# Patient Record
Sex: Female | Born: 1955 | Race: Black or African American | Hispanic: No | Marital: Single | Smoking: Former smoker
Health system: Southern US, Community
[De-identification: ages and names within clinical notes are randomized; demographics above are authoritative.]

## PROBLEM LIST (undated history)

## (undated) DIAGNOSIS — I5022 Chronic systolic (congestive) heart failure: Secondary | ICD-10-CM

## (undated) DIAGNOSIS — J449 Chronic obstructive pulmonary disease, unspecified: Secondary | ICD-10-CM

## (undated) DIAGNOSIS — I2510 Atherosclerotic heart disease of native coronary artery without angina pectoris: Secondary | ICD-10-CM

## (undated) DIAGNOSIS — Z720 Tobacco use: Secondary | ICD-10-CM

## (undated) DIAGNOSIS — E785 Hyperlipidemia, unspecified: Secondary | ICD-10-CM

## (undated) DIAGNOSIS — R739 Hyperglycemia, unspecified: Secondary | ICD-10-CM

## (undated) DIAGNOSIS — Z95810 Presence of automatic (implantable) cardiac defibrillator: Secondary | ICD-10-CM

## (undated) DIAGNOSIS — I739 Peripheral vascular disease, unspecified: Secondary | ICD-10-CM

## (undated) DIAGNOSIS — I472 Ventricular tachycardia: Secondary | ICD-10-CM

## (undated) DIAGNOSIS — I5023 Acute on chronic systolic (congestive) heart failure: Secondary | ICD-10-CM

## (undated) DIAGNOSIS — F1010 Alcohol abuse, uncomplicated: Secondary | ICD-10-CM

## (undated) DIAGNOSIS — J9600 Acute respiratory failure, unspecified whether with hypoxia or hypercapnia: Secondary | ICD-10-CM

## (undated) DIAGNOSIS — I10 Essential (primary) hypertension: Secondary | ICD-10-CM

## (undated) DIAGNOSIS — G459 Transient cerebral ischemic attack, unspecified: Secondary | ICD-10-CM

## (undated) HISTORY — DX: Essential (primary) hypertension: I10

## (undated) HISTORY — PX: CYSTOSCOPY W/ STONE MANIPULATION: SHX1427

## (undated) HISTORY — DX: Ventricular tachycardia: I47.2

## (undated) HISTORY — PX: COLONOSCOPY: SHX174

## (undated) HISTORY — DX: Hyperlipidemia, unspecified: E78.5

## (undated) HISTORY — DX: Chronic systolic (congestive) heart failure: I50.22

---

## 1997-12-10 ENCOUNTER — Emergency Department (HOSPITAL_COMMUNITY): Admission: EM | Admit: 1997-12-10 | Discharge: 1997-12-10 | Payer: Self-pay | Admitting: Emergency Medicine

## 1997-12-21 ENCOUNTER — Ambulatory Visit (HOSPITAL_COMMUNITY): Admission: RE | Admit: 1997-12-21 | Discharge: 1997-12-21 | Payer: Self-pay | Admitting: Emergency Medicine

## 1998-08-06 ENCOUNTER — Emergency Department (HOSPITAL_COMMUNITY): Admission: EM | Admit: 1998-08-06 | Discharge: 1998-08-06 | Payer: Self-pay | Admitting: Emergency Medicine

## 1999-12-20 ENCOUNTER — Emergency Department (HOSPITAL_COMMUNITY): Admission: EM | Admit: 1999-12-20 | Discharge: 1999-12-20 | Payer: Self-pay | Admitting: Emergency Medicine

## 2000-01-19 ENCOUNTER — Emergency Department (HOSPITAL_COMMUNITY): Admission: EM | Admit: 2000-01-19 | Discharge: 2000-01-19 | Payer: Self-pay | Admitting: Emergency Medicine

## 2001-03-17 ENCOUNTER — Emergency Department (HOSPITAL_COMMUNITY): Admission: EM | Admit: 2001-03-17 | Discharge: 2001-03-17 | Payer: Self-pay

## 2001-12-11 ENCOUNTER — Encounter: Payer: Self-pay | Admitting: Emergency Medicine

## 2001-12-11 ENCOUNTER — Emergency Department (HOSPITAL_COMMUNITY): Admission: EM | Admit: 2001-12-11 | Discharge: 2001-12-11 | Payer: Self-pay | Admitting: Emergency Medicine

## 2002-05-30 ENCOUNTER — Emergency Department (HOSPITAL_COMMUNITY): Admission: EM | Admit: 2002-05-30 | Discharge: 2002-05-30 | Payer: Self-pay | Admitting: Emergency Medicine

## 2002-05-30 ENCOUNTER — Encounter: Payer: Self-pay | Admitting: Emergency Medicine

## 2003-09-20 ENCOUNTER — Emergency Department (HOSPITAL_COMMUNITY): Admission: EM | Admit: 2003-09-20 | Discharge: 2003-09-20 | Payer: Self-pay | Admitting: Emergency Medicine

## 2004-09-09 ENCOUNTER — Emergency Department (HOSPITAL_COMMUNITY): Admission: EM | Admit: 2004-09-09 | Discharge: 2004-09-09 | Payer: Self-pay | Admitting: Emergency Medicine

## 2004-10-18 ENCOUNTER — Emergency Department (HOSPITAL_COMMUNITY): Admission: EM | Admit: 2004-10-18 | Discharge: 2004-10-18 | Payer: Self-pay | Admitting: Family Medicine

## 2006-07-21 ENCOUNTER — Emergency Department (HOSPITAL_COMMUNITY): Admission: EM | Admit: 2006-07-21 | Discharge: 2006-07-21 | Payer: Self-pay | Admitting: Emergency Medicine

## 2007-02-28 ENCOUNTER — Ambulatory Visit: Payer: Self-pay | Admitting: *Deleted

## 2007-02-28 ENCOUNTER — Inpatient Hospital Stay (HOSPITAL_COMMUNITY): Admission: EM | Admit: 2007-02-28 | Discharge: 2007-03-04 | Payer: Self-pay | Admitting: *Deleted

## 2007-03-05 ENCOUNTER — Ambulatory Visit: Payer: Self-pay | Admitting: *Deleted

## 2007-03-12 ENCOUNTER — Emergency Department (HOSPITAL_COMMUNITY): Admission: EM | Admit: 2007-03-12 | Discharge: 2007-03-12 | Payer: Self-pay | Admitting: Emergency Medicine

## 2007-03-20 ENCOUNTER — Ambulatory Visit: Payer: Self-pay | Admitting: Cardiology

## 2007-06-24 ENCOUNTER — Ambulatory Visit: Payer: Self-pay | Admitting: Internal Medicine

## 2007-06-24 LAB — CONVERTED CEMR LAB
Alkaline Phosphatase: 69 units/L (ref 39–117)
BUN: 10 mg/dL (ref 6–23)
Basophils Relative: 1 % (ref 0–1)
Calcium: 9.6 mg/dL (ref 8.4–10.5)
Chloride: 106 meq/L (ref 96–112)
Creatinine, Ser: 0.6 mg/dL (ref 0.40–1.20)
Eosinophils Absolute: 0.5 10*3/uL (ref 0.0–0.7)
Eosinophils Relative: 6 % — ABNORMAL HIGH (ref 0–5)
Glucose, Bld: 84 mg/dL (ref 70–99)
LDL Cholesterol: 89 mg/dL (ref 0–99)
Lymphocytes Relative: 61 % — ABNORMAL HIGH (ref 12–46)
MCHC: 32.4 g/dL (ref 30.0–36.0)
Monocytes Relative: 8 % (ref 3–11)
RDW: 14.6 % — ABNORMAL HIGH (ref 11.5–14.0)
Sodium: 142 meq/L (ref 135–145)
Total CHOL/HDL Ratio: 4.1

## 2007-07-08 ENCOUNTER — Emergency Department (HOSPITAL_COMMUNITY): Admission: EM | Admit: 2007-07-08 | Discharge: 2007-07-08 | Payer: Self-pay | Admitting: Emergency Medicine

## 2007-08-19 ENCOUNTER — Emergency Department (HOSPITAL_COMMUNITY): Admission: EM | Admit: 2007-08-19 | Discharge: 2007-08-19 | Payer: Self-pay | Admitting: Family Medicine

## 2007-08-24 ENCOUNTER — Ambulatory Visit: Payer: Self-pay | Admitting: Internal Medicine

## 2007-09-09 ENCOUNTER — Ambulatory Visit: Payer: Self-pay | Admitting: Cardiology

## 2007-09-14 ENCOUNTER — Ambulatory Visit (HOSPITAL_COMMUNITY): Admission: RE | Admit: 2007-09-14 | Discharge: 2007-09-14 | Payer: Self-pay | Admitting: Family Medicine

## 2007-09-22 ENCOUNTER — Ambulatory Visit: Payer: Self-pay

## 2007-09-22 LAB — CONVERTED CEMR LAB
ALT: 13 units/L (ref 0–35)
Albumin: 3.7 g/dL (ref 3.5–5.2)
Alkaline Phosphatase: 61 units/L (ref 39–117)
BUN: 10 mg/dL (ref 6–23)
CO2: 28 meq/L (ref 19–32)
Chloride: 106 meq/L (ref 96–112)
Cholesterol: 118 mg/dL (ref 0–200)
Creatinine, Ser: 0.6 mg/dL (ref 0.4–1.2)
Eosinophils Absolute: 0.4 10*3/uL (ref 0.0–0.6)
Eosinophils Relative: 5.4 % — ABNORMAL HIGH (ref 0.0–5.0)
GFR calc non Af Amer: 113 mL/min
Hemoglobin: 14.8 g/dL (ref 12.0–15.0)
LDL Cholesterol: 67 mg/dL (ref 0–99)
MCV: 90.1 fL (ref 78.0–100.0)
Monocytes Relative: 6.2 % (ref 3.0–11.0)
Neutro Abs: 2.7 10*3/uL (ref 1.4–7.7)
Neutrophils Relative %: 32.6 % — ABNORMAL LOW (ref 43.0–77.0)
Platelets: 168 10*3/uL (ref 150–400)
RDW: 12.5 % (ref 11.5–14.6)
Total Bilirubin: 0.8 mg/dL (ref 0.3–1.2)
Total CHOL/HDL Ratio: 3.2

## 2007-09-30 ENCOUNTER — Ambulatory Visit (HOSPITAL_COMMUNITY): Admission: RE | Admit: 2007-09-30 | Discharge: 2007-09-30 | Payer: Self-pay | Admitting: Cardiology

## 2007-09-30 ENCOUNTER — Ambulatory Visit: Payer: Self-pay | Admitting: Cardiology

## 2007-10-11 ENCOUNTER — Emergency Department (HOSPITAL_COMMUNITY): Admission: EM | Admit: 2007-10-11 | Discharge: 2007-10-11 | Payer: Self-pay | Admitting: Emergency Medicine

## 2007-10-11 ENCOUNTER — Ambulatory Visit: Payer: Self-pay | Admitting: Cardiology

## 2007-10-15 ENCOUNTER — Ambulatory Visit: Payer: Self-pay

## 2007-10-15 ENCOUNTER — Encounter: Payer: Self-pay | Admitting: Cardiology

## 2007-10-30 ENCOUNTER — Emergency Department (HOSPITAL_COMMUNITY): Admission: EM | Admit: 2007-10-30 | Discharge: 2007-10-30 | Payer: Self-pay | Admitting: Emergency Medicine

## 2007-11-17 ENCOUNTER — Ambulatory Visit: Payer: Self-pay | Admitting: Cardiology

## 2007-11-30 ENCOUNTER — Ambulatory Visit (HOSPITAL_COMMUNITY): Admission: RE | Admit: 2007-11-30 | Discharge: 2007-11-30 | Payer: Self-pay | Admitting: Surgery

## 2007-11-30 ENCOUNTER — Encounter (INDEPENDENT_AMBULATORY_CARE_PROVIDER_SITE_OTHER): Payer: Self-pay | Admitting: Surgery

## 2007-12-30 ENCOUNTER — Ambulatory Visit: Payer: Self-pay | Admitting: Internal Medicine

## 2008-01-09 ENCOUNTER — Emergency Department (HOSPITAL_COMMUNITY): Admission: EM | Admit: 2008-01-09 | Discharge: 2008-01-09 | Payer: Self-pay | Admitting: Emergency Medicine

## 2008-02-18 ENCOUNTER — Emergency Department (HOSPITAL_COMMUNITY): Admission: EM | Admit: 2008-02-18 | Discharge: 2008-02-18 | Payer: Self-pay | Admitting: Emergency Medicine

## 2008-04-25 ENCOUNTER — Emergency Department (HOSPITAL_COMMUNITY): Admission: EM | Admit: 2008-04-25 | Discharge: 2008-04-25 | Payer: Self-pay | Admitting: Emergency Medicine

## 2008-04-30 ENCOUNTER — Emergency Department (HOSPITAL_COMMUNITY): Admission: EM | Admit: 2008-04-30 | Discharge: 2008-05-01 | Payer: Self-pay | Admitting: Emergency Medicine

## 2008-05-04 ENCOUNTER — Ambulatory Visit: Payer: Self-pay | Admitting: Cardiology

## 2008-05-04 LAB — CONVERTED CEMR LAB
Calcium: 9.3 mg/dL (ref 8.4–10.5)
Creatinine, Ser: 0.8 mg/dL (ref 0.4–1.2)
GFR calc Af Amer: 98 mL/min
Glucose, Bld: 84 mg/dL (ref 70–99)

## 2008-05-09 ENCOUNTER — Ambulatory Visit: Payer: Self-pay

## 2008-05-09 ENCOUNTER — Encounter: Payer: Self-pay | Admitting: Cardiology

## 2008-05-13 ENCOUNTER — Ambulatory Visit: Payer: Self-pay | Admitting: Cardiology

## 2008-05-18 ENCOUNTER — Emergency Department (HOSPITAL_COMMUNITY): Admission: EM | Admit: 2008-05-18 | Discharge: 2008-05-18 | Payer: Self-pay | Admitting: Emergency Medicine

## 2008-06-22 ENCOUNTER — Ambulatory Visit: Payer: Self-pay | Admitting: Cardiology

## 2008-06-25 ENCOUNTER — Emergency Department (HOSPITAL_COMMUNITY): Admission: EM | Admit: 2008-06-25 | Discharge: 2008-06-25 | Payer: Self-pay | Admitting: Emergency Medicine

## 2008-06-28 ENCOUNTER — Ambulatory Visit: Payer: Self-pay | Admitting: Cardiology

## 2008-06-28 LAB — CONVERTED CEMR LAB
Alkaline Phosphatase: 64 units/L (ref 39–117)
Basophils Absolute: 0.1 10*3/uL (ref 0.0–0.1)
Basophils Relative: 0.9 % (ref 0.0–3.0)
Bilirubin, Direct: 0.1 mg/dL (ref 0.0–0.3)
Calcium: 9.1 mg/dL (ref 8.4–10.5)
Chloride: 108 meq/L (ref 96–112)
Cholesterol: 113 mg/dL (ref 0–200)
Creatinine, Ser: 0.6 mg/dL (ref 0.4–1.2)
Glucose, Bld: 114 mg/dL — ABNORMAL HIGH (ref 70–99)
HDL: 24.8 mg/dL — ABNORMAL LOW (ref >39.0)
LDL Cholesterol: 73 mg/dL (ref 0–99)
MCHC: 34.1 g/dL (ref 30.0–36.0)
MCV: 90.5 fL (ref 78.0–100.0)
Monocytes Relative: 7.7 % (ref 3.0–12.0)
Neutrophils Relative %: 30.9 % — ABNORMAL LOW (ref 43.0–77.0)
Platelets: 203 10*3/uL (ref 150–400)
RBC: 4.9 M/uL (ref 3.87–5.11)
Sodium: 141 meq/L (ref 135–145)
Triglycerides: 74 mg/dL (ref 0–149)
VLDL: 15 mg/dL (ref 0–40)

## 2008-08-22 ENCOUNTER — Emergency Department (HOSPITAL_COMMUNITY): Admission: EM | Admit: 2008-08-22 | Discharge: 2008-08-22 | Payer: Self-pay | Admitting: Emergency Medicine

## 2008-08-24 ENCOUNTER — Emergency Department (HOSPITAL_COMMUNITY): Admission: EM | Admit: 2008-08-24 | Discharge: 2008-08-24 | Payer: Self-pay | Admitting: Emergency Medicine

## 2008-09-25 ENCOUNTER — Ambulatory Visit: Payer: Self-pay | Admitting: Internal Medicine

## 2008-09-26 ENCOUNTER — Encounter: Payer: Self-pay | Admitting: Cardiology

## 2008-09-26 ENCOUNTER — Inpatient Hospital Stay (HOSPITAL_COMMUNITY): Admission: EM | Admit: 2008-09-26 | Discharge: 2008-09-30 | Payer: Self-pay | Admitting: Emergency Medicine

## 2008-09-28 ENCOUNTER — Ambulatory Visit: Payer: Self-pay | Admitting: Internal Medicine

## 2008-09-29 ENCOUNTER — Encounter: Payer: Self-pay | Admitting: Internal Medicine

## 2008-10-11 ENCOUNTER — Ambulatory Visit: Payer: Self-pay | Admitting: Internal Medicine

## 2008-10-12 LAB — CONVERTED CEMR LAB
ALT: 41 units/L — ABNORMAL HIGH (ref 0–35)
AST: 29 units/L (ref 0–37)
Albumin: 3.7 g/dL (ref 3.5–5.2)
Alkaline Phosphatase: 71 units/L (ref 39–117)
BUN: 14 mg/dL (ref 6–23)
Calcium: 9.8 mg/dL (ref 8.4–10.5)
Chloride: 107 meq/L (ref 96–112)
GFR calc non Af Amer: 70 mL/min
Sodium: 141 meq/L (ref 135–145)
Total Bilirubin: 0.4 mg/dL (ref 0.3–1.2)
Total Protein: 7.3 g/dL (ref 6.0–8.3)

## 2008-10-13 ENCOUNTER — Ambulatory Visit: Payer: Self-pay | Admitting: Cardiology

## 2008-10-17 ENCOUNTER — Ambulatory Visit: Payer: Self-pay | Admitting: Internal Medicine

## 2008-10-21 ENCOUNTER — Ambulatory Visit: Payer: Self-pay | Admitting: Cardiology

## 2008-10-21 LAB — CONVERTED CEMR LAB
BUN: 7 mg/dL (ref 6–23)
Calcium: 9.2 mg/dL (ref 8.4–10.5)
Glucose, Bld: 106 mg/dL — ABNORMAL HIGH (ref 70–99)
Potassium: 3.8 meq/L (ref 3.5–5.1)
Sodium: 140 meq/L (ref 135–145)

## 2008-10-26 ENCOUNTER — Ambulatory Visit: Payer: Self-pay | Admitting: Internal Medicine

## 2008-10-27 LAB — CONVERTED CEMR LAB
ALT: 13 units/L (ref 0–35)
Bilirubin, Direct: 0.1 mg/dL (ref 0.0–0.3)
Total Bilirubin: 0.8 mg/dL (ref 0.3–1.2)

## 2008-11-01 ENCOUNTER — Telehealth: Payer: Self-pay | Admitting: Internal Medicine

## 2008-11-15 ENCOUNTER — Encounter: Payer: Self-pay | Admitting: Internal Medicine

## 2008-11-15 ENCOUNTER — Ambulatory Visit: Payer: Self-pay | Admitting: Internal Medicine

## 2008-11-29 ENCOUNTER — Encounter: Payer: Self-pay | Admitting: Internal Medicine

## 2009-01-21 ENCOUNTER — Emergency Department (HOSPITAL_COMMUNITY): Admission: EM | Admit: 2009-01-21 | Discharge: 2009-01-21 | Payer: Self-pay | Admitting: *Deleted

## 2009-02-07 DIAGNOSIS — E785 Hyperlipidemia, unspecified: Secondary | ICD-10-CM

## 2009-02-07 DIAGNOSIS — I5022 Chronic systolic (congestive) heart failure: Secondary | ICD-10-CM

## 2009-02-07 DIAGNOSIS — I428 Other cardiomyopathies: Secondary | ICD-10-CM | POA: Insufficient documentation

## 2009-02-07 DIAGNOSIS — I2510 Atherosclerotic heart disease of native coronary artery without angina pectoris: Secondary | ICD-10-CM | POA: Insufficient documentation

## 2009-02-07 HISTORY — DX: Hyperlipidemia, unspecified: E78.5

## 2009-02-07 HISTORY — DX: Chronic systolic (congestive) heart failure: I50.22

## 2009-02-08 ENCOUNTER — Ambulatory Visit: Payer: Self-pay | Admitting: Cardiology

## 2009-02-10 LAB — CONVERTED CEMR LAB
BUN: 7 mg/dL (ref 6–23)
Basophils Absolute: 0.8 10*3/uL — ABNORMAL HIGH (ref 0.0–0.1)
CO2: 29 meq/L (ref 19–32)
Eosinophils Absolute: 0.4 10*3/uL (ref 0.0–0.7)
Glucose, Bld: 90 mg/dL (ref 70–99)
HCT: 42.7 % (ref 36.0–46.0)
Hemoglobin: 14.3 g/dL (ref 12.0–15.0)
Lymphs Abs: 3.6 10*3/uL (ref 0.7–4.0)
MCHC: 33.4 g/dL (ref 30.0–36.0)
MCV: 90.7 fL (ref 78.0–100.0)
Monocytes Absolute: 0.6 10*3/uL (ref 0.1–1.0)
Monocytes Relative: 8.9 % (ref 3.0–12.0)
Neutro Abs: 2 10*3/uL (ref 1.4–7.7)
Platelets: 162 10*3/uL (ref 150.0–400.0)
Potassium: 3.6 meq/L (ref 3.5–5.1)
RDW: 12.9 % (ref 11.5–14.6)
Sodium: 144 meq/L (ref 135–145)

## 2009-04-07 ENCOUNTER — Telehealth: Payer: Self-pay | Admitting: Cardiology

## 2009-04-24 ENCOUNTER — Telehealth: Payer: Self-pay | Admitting: Cardiology

## 2009-05-01 ENCOUNTER — Emergency Department (HOSPITAL_COMMUNITY): Admission: EM | Admit: 2009-05-01 | Discharge: 2009-05-01 | Payer: Self-pay | Admitting: Emergency Medicine

## 2009-05-04 ENCOUNTER — Encounter (INDEPENDENT_AMBULATORY_CARE_PROVIDER_SITE_OTHER): Payer: Self-pay | Admitting: *Deleted

## 2009-05-08 ENCOUNTER — Telehealth: Payer: Self-pay | Admitting: Cardiology

## 2009-05-12 ENCOUNTER — Telehealth: Payer: Self-pay | Admitting: Cardiology

## 2009-05-16 ENCOUNTER — Ambulatory Visit: Payer: Self-pay | Admitting: Internal Medicine

## 2009-05-16 ENCOUNTER — Encounter (INDEPENDENT_AMBULATORY_CARE_PROVIDER_SITE_OTHER): Payer: Self-pay | Admitting: Adult Health

## 2009-05-16 LAB — CONVERTED CEMR LAB
ALT: 10 units/L (ref 0–35)
Alkaline Phosphatase: 59 units/L (ref 39–117)
CO2: 20 meq/L (ref 19–32)
Cholesterol: 139 mg/dL (ref 0–200)
Creatinine, Ser: 0.68 mg/dL (ref 0.40–1.20)
LDL Cholesterol: 74 mg/dL (ref 0–99)
Pro B Natriuretic peptide (BNP): 51.9 pg/mL (ref 0.0–100.0)
Sodium: 143 meq/L (ref 135–145)
Total Bilirubin: 0.5 mg/dL (ref 0.3–1.2)
Total CHOL/HDL Ratio: 2.8
VLDL: 15 mg/dL (ref 0–40)

## 2009-06-05 ENCOUNTER — Ambulatory Visit (HOSPITAL_COMMUNITY): Admission: RE | Admit: 2009-06-05 | Discharge: 2009-06-05 | Payer: Self-pay | Admitting: Family Medicine

## 2009-06-27 ENCOUNTER — Emergency Department (HOSPITAL_COMMUNITY): Admission: EM | Admit: 2009-06-27 | Discharge: 2009-06-27 | Payer: Self-pay | Admitting: Emergency Medicine

## 2009-06-30 ENCOUNTER — Observation Stay (HOSPITAL_COMMUNITY): Admission: EM | Admit: 2009-06-30 | Discharge: 2009-07-01 | Payer: Self-pay | Admitting: Emergency Medicine

## 2009-06-30 ENCOUNTER — Ambulatory Visit: Payer: Self-pay | Admitting: Cardiovascular Disease

## 2009-07-12 ENCOUNTER — Ambulatory Visit: Payer: Self-pay | Admitting: Internal Medicine

## 2009-07-12 ENCOUNTER — Encounter (INDEPENDENT_AMBULATORY_CARE_PROVIDER_SITE_OTHER): Payer: Self-pay | Admitting: Adult Health

## 2009-07-17 ENCOUNTER — Telehealth (INDEPENDENT_AMBULATORY_CARE_PROVIDER_SITE_OTHER): Payer: Self-pay | Admitting: *Deleted

## 2009-07-17 ENCOUNTER — Encounter: Payer: Self-pay | Admitting: Cardiology

## 2009-07-18 ENCOUNTER — Ambulatory Visit: Payer: Self-pay | Admitting: Cardiology

## 2009-07-18 ENCOUNTER — Encounter (HOSPITAL_COMMUNITY): Admission: RE | Admit: 2009-07-18 | Discharge: 2009-09-08 | Payer: Self-pay | Admitting: Cardiology

## 2009-07-18 ENCOUNTER — Ambulatory Visit: Payer: Self-pay

## 2009-07-18 ENCOUNTER — Encounter (INDEPENDENT_AMBULATORY_CARE_PROVIDER_SITE_OTHER): Payer: Self-pay | Admitting: *Deleted

## 2009-07-18 DIAGNOSIS — R072 Precordial pain: Secondary | ICD-10-CM

## 2009-07-18 DIAGNOSIS — R9430 Abnormal result of cardiovascular function study, unspecified: Secondary | ICD-10-CM | POA: Insufficient documentation

## 2009-07-19 ENCOUNTER — Ambulatory Visit (HOSPITAL_COMMUNITY): Admission: RE | Admit: 2009-07-19 | Discharge: 2009-07-19 | Payer: Self-pay | Admitting: Cardiology

## 2009-07-19 ENCOUNTER — Ambulatory Visit: Payer: Self-pay | Admitting: Cardiology

## 2009-08-13 ENCOUNTER — Emergency Department (HOSPITAL_COMMUNITY): Admission: EM | Admit: 2009-08-13 | Discharge: 2009-08-14 | Payer: Self-pay | Admitting: Emergency Medicine

## 2009-08-22 ENCOUNTER — Ambulatory Visit: Payer: Self-pay | Admitting: Cardiology

## 2009-08-22 DIAGNOSIS — I10 Essential (primary) hypertension: Secondary | ICD-10-CM

## 2009-08-22 HISTORY — DX: Essential (primary) hypertension: I10

## 2009-08-28 ENCOUNTER — Ambulatory Visit: Payer: Self-pay | Admitting: Cardiology

## 2009-08-28 DIAGNOSIS — I119 Hypertensive heart disease without heart failure: Secondary | ICD-10-CM

## 2009-08-29 LAB — CONVERTED CEMR LAB
BUN: 8 mg/dL (ref 6–23)
Chloride: 106 meq/L (ref 96–112)
Creatinine, Ser: 0.6 mg/dL (ref 0.4–1.2)
GFR calc non Af Amer: 135.08 mL/min (ref >60)
Glucose, Bld: 101 mg/dL — ABNORMAL HIGH (ref 70–99)
Potassium: 3.8 meq/L (ref 3.5–5.1)

## 2009-09-06 ENCOUNTER — Telehealth: Payer: Self-pay | Admitting: Cardiology

## 2009-09-14 ENCOUNTER — Telehealth: Payer: Self-pay | Admitting: Cardiology

## 2009-09-22 ENCOUNTER — Emergency Department (HOSPITAL_COMMUNITY): Admission: EM | Admit: 2009-09-22 | Discharge: 2009-09-22 | Payer: Self-pay | Admitting: Emergency Medicine

## 2009-11-05 ENCOUNTER — Emergency Department (HOSPITAL_COMMUNITY): Admission: EM | Admit: 2009-11-05 | Discharge: 2009-11-05 | Payer: Self-pay | Admitting: Emergency Medicine

## 2009-11-15 ENCOUNTER — Emergency Department (HOSPITAL_COMMUNITY): Admission: EM | Admit: 2009-11-15 | Discharge: 2009-11-15 | Payer: Self-pay | Admitting: Emergency Medicine

## 2009-12-29 ENCOUNTER — Emergency Department (HOSPITAL_COMMUNITY): Admission: EM | Admit: 2009-12-29 | Discharge: 2009-12-29 | Payer: Self-pay | Admitting: Emergency Medicine

## 2010-01-03 ENCOUNTER — Telehealth: Payer: Self-pay | Admitting: Cardiology

## 2010-02-15 ENCOUNTER — Ambulatory Visit: Payer: Self-pay | Admitting: Cardiology

## 2010-03-04 ENCOUNTER — Ambulatory Visit: Payer: Self-pay | Admitting: Cardiology

## 2010-03-05 ENCOUNTER — Encounter: Payer: Self-pay | Admitting: Cardiology

## 2010-03-05 ENCOUNTER — Inpatient Hospital Stay (HOSPITAL_COMMUNITY): Admission: EM | Admit: 2010-03-05 | Discharge: 2010-03-05 | Payer: Self-pay | Admitting: Emergency Medicine

## 2010-03-27 ENCOUNTER — Encounter: Payer: Self-pay | Admitting: Cardiology

## 2010-04-11 ENCOUNTER — Telehealth (INDEPENDENT_AMBULATORY_CARE_PROVIDER_SITE_OTHER): Payer: Self-pay | Admitting: *Deleted

## 2010-05-21 ENCOUNTER — Ambulatory Visit: Payer: Self-pay | Admitting: Cardiology

## 2010-05-21 DIAGNOSIS — I472 Ventricular tachycardia, unspecified: Secondary | ICD-10-CM

## 2010-05-21 DIAGNOSIS — I4729 Other ventricular tachycardia: Secondary | ICD-10-CM

## 2010-05-21 HISTORY — DX: Ventricular tachycardia: I47.2

## 2010-05-21 HISTORY — DX: Ventricular tachycardia, unspecified: I47.20

## 2010-05-21 HISTORY — DX: Other ventricular tachycardia: I47.29

## 2010-06-13 ENCOUNTER — Telehealth (INDEPENDENT_AMBULATORY_CARE_PROVIDER_SITE_OTHER): Payer: Self-pay | Admitting: *Deleted

## 2010-08-10 ENCOUNTER — Telehealth: Payer: Self-pay | Admitting: Cardiology

## 2010-08-21 IMAGING — CT CT HEAD W/O CM
1 series · 16 of 30 positions shown, 20 images · non-contrast
Comparison: 01/09/2008

CLINICAL DATA: Headache

CT HEAD WITHOUT CONTRAST
TECHNIQUE: Contiguous axial images were obtained from the base of
the skull through the vertex without contrast.

[Series 2: head_seq 4.5 h37s st · axial · 0.43mm/px · z∈[+1008,+1152]mm · 16 of 36 slices shown, 20 images]
[im 2/36  brain]
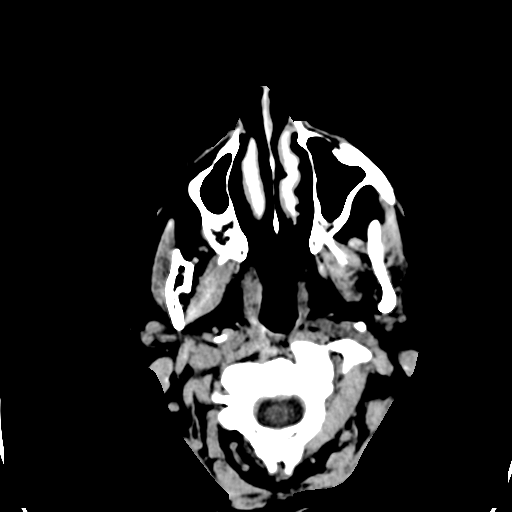
[im 2/36  bone]
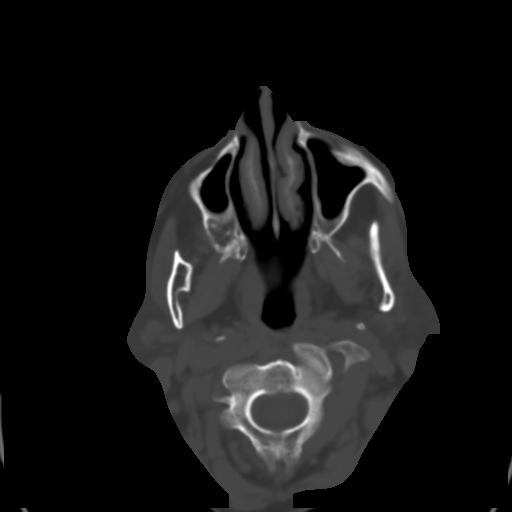
[im 4/36  brain]
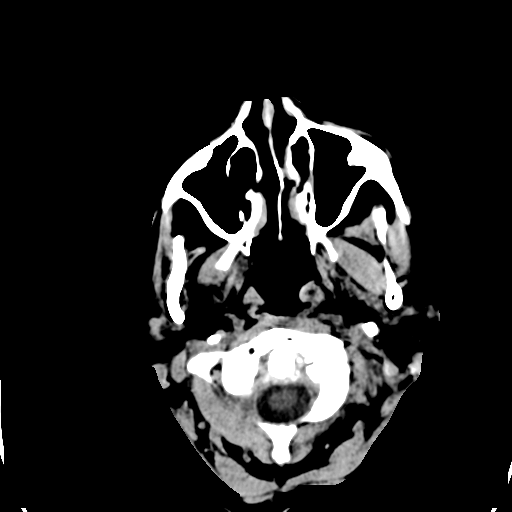
[im 7/36  brain]
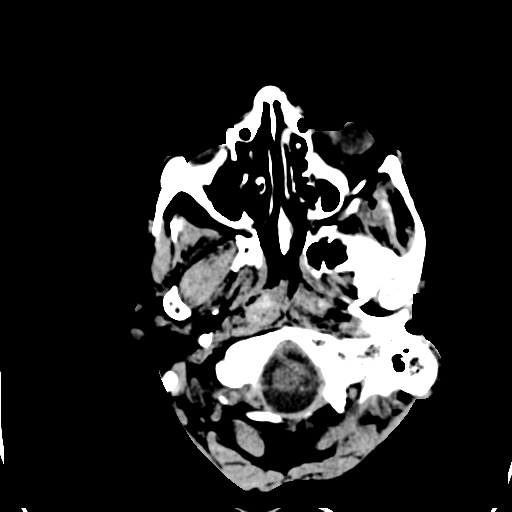
[im 9/36  brain]
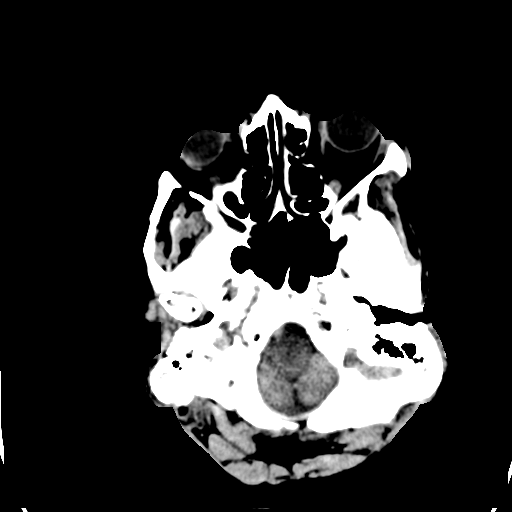
[im 10/36  brain]
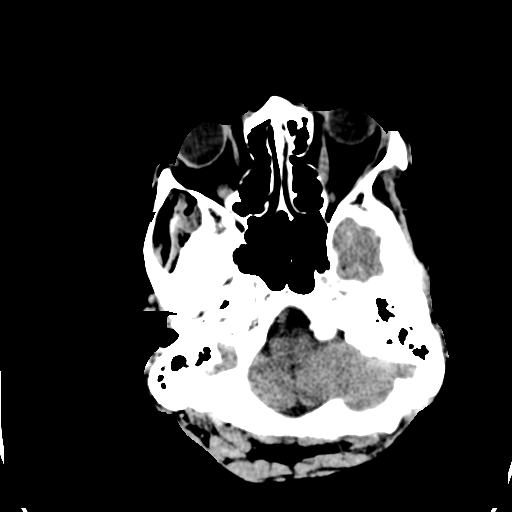
[im 10/36  bone]
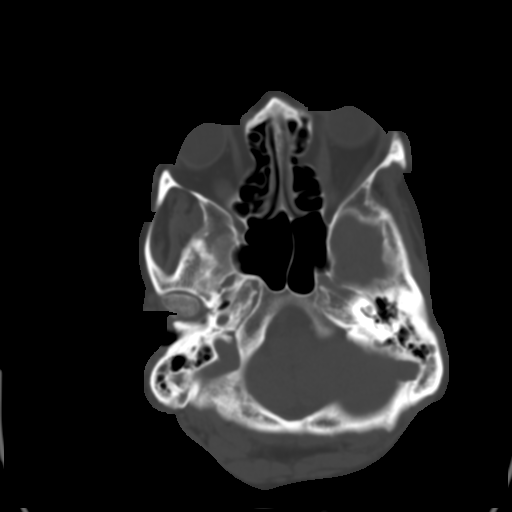
[im 13/36  brain]
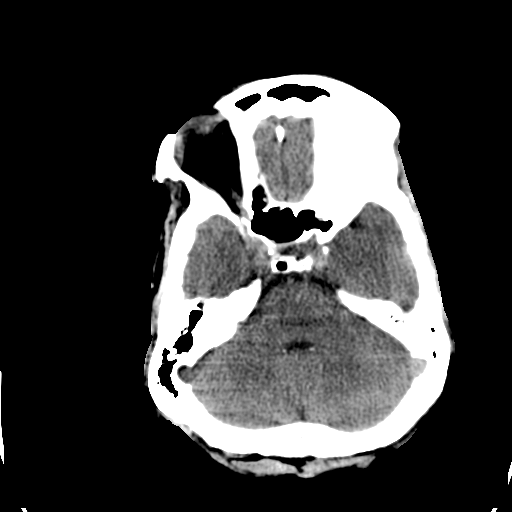
[im 15/36  brain]
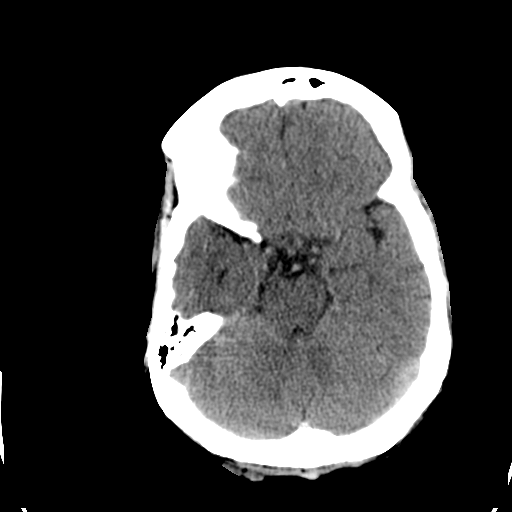
[im 17/36  brain]
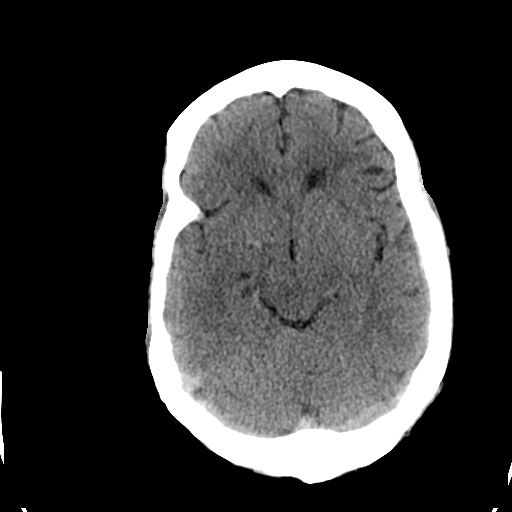
[im 19/36  brain]
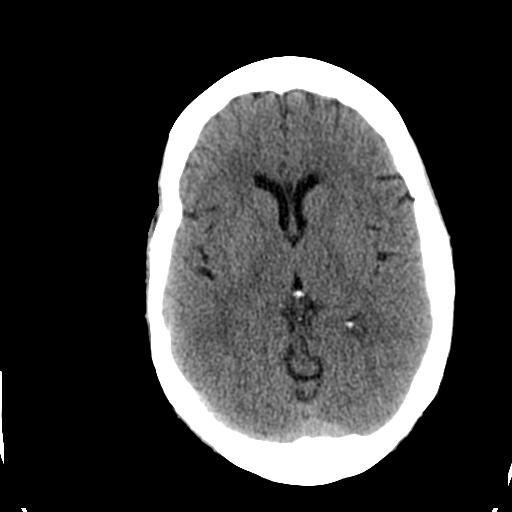
[im 19/36  bone]
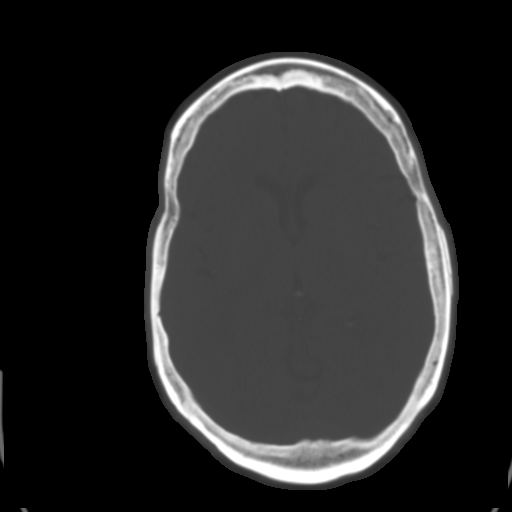
[im 21/36  brain]
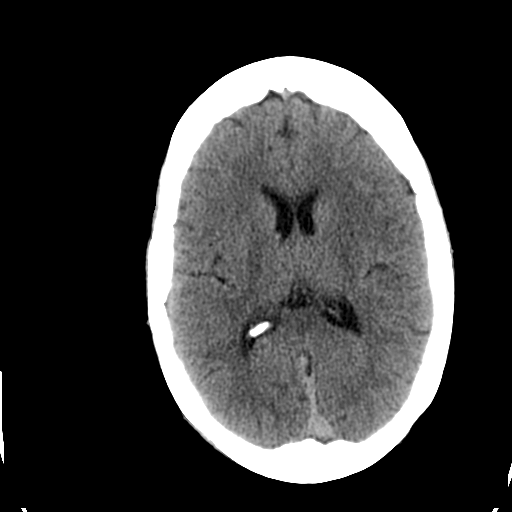
[im 23/36  brain]
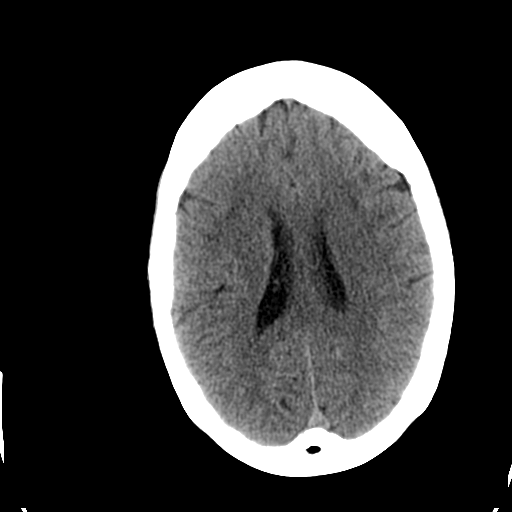
[im 26/36  brain]
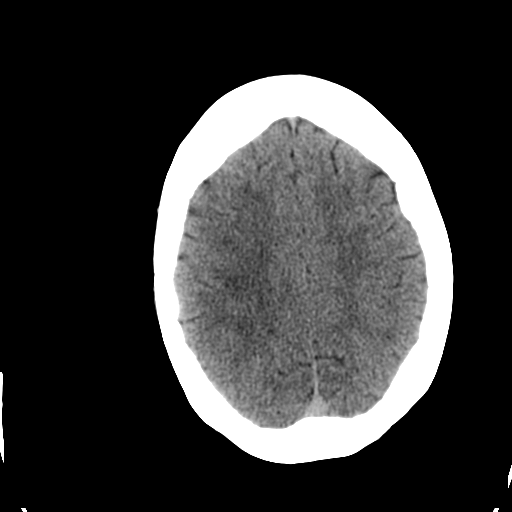
[im 27/36  brain]
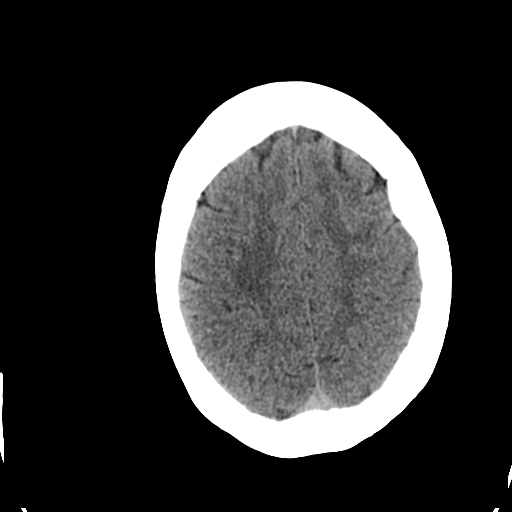
[im 27/36  bone]
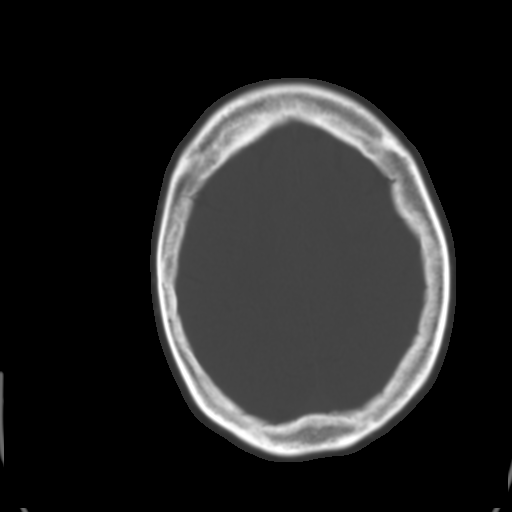
[im 29/36  brain]
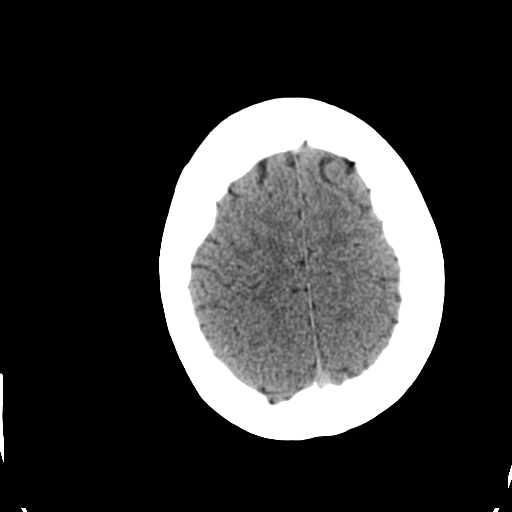
[im 32/36  brain]
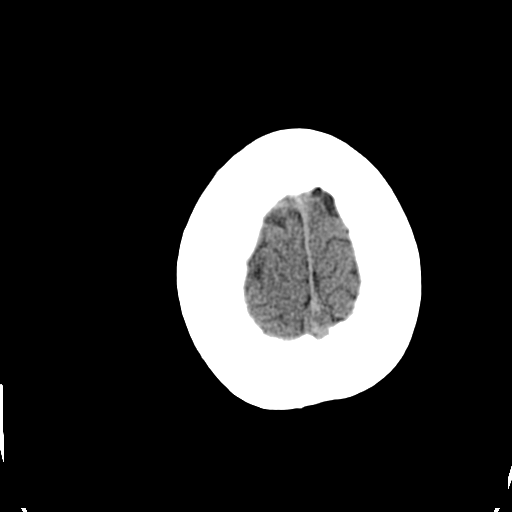
[im 34/36  brain]
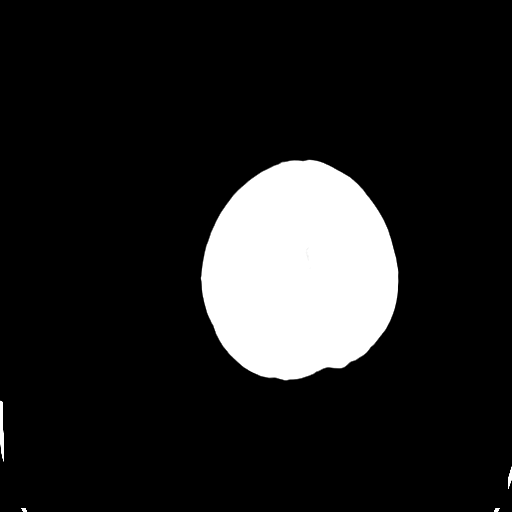

[16 of 30 positions shown; findings below may reference images not displayed]

FINDINGS: The ventricles are normal and stable.  No extra-axial
fluid collections are seen.  Minimal patchy white matter low
attenuation.  This may be due to microvascular ischemic change,
migraine headaches or demyelinating process.  The brainstem and
cerebellum are grossly normal and stable.  No acute intracranial
findings or mass lesions.  The bony calvarium is intact.  The
visualized paranasal sinuses mastoid air cells are clear.  Previous
sinonasal surgery and bilateral medial orbital wall fractures.
Vascular calcifications are noted.
IMPRESSION: 1.  No acute intracranial findings.
2.  Stable patchy periventricular white matter disease.

## 2010-10-11 NOTE — Progress Notes (Signed)
Summary: left breast pains   Phone Note Call from Patient Call back at Work Phone [phone]   Caller: Patient Reason for Call: Talk to Nurse Summary of Call: having pains around left breast..... going on since yesterday, gets dizzy when she has these pains Initial call taken by: Migdalia Dk,  January 03, 2010 4:00 PM  Follow-up for Phone Call        I called and spoke with the pt. She c/o discomfort to her left breast area/ heaviness since yesterday. 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 The pt is not really sure if this is similar to when she had her stents placed. I advised the pt that if it is worse with a twisting motion, then it may be more muscle pain. The pt will try some tylenol. I have also advised her if her pain becomes more persistant, to try a NTG. She has been advised to report to the ER if she requires 3 NTG. I have also advised her to call us back tomorrow if she does require NTG, but receives relief with only one or two tablets. The pt verbalizes understanding. Follow-up by: Sherri Rad, RN, BSN,  January 03, 2010 4:50PM

## 2010-10-11 NOTE — Progress Notes (Signed)
Summary: rx refill   Phone Note Call from Patient Call back at [phone] Message from:  Patient on August 10, 2010 4:20 PM  Refills Requested: Medication #1:  CARVEDILOL 25 MG TABS 2 TABS two times a day [facility]# [phone]   Method Requested: Telephone to Pharmacy Initial call taken by: Roe Coombs,  August 10, 2010 4:20 PM    Prescriptions: CARVEDILOL 25 MG TABS (CARVEDILOL) 2 TABS two times a day  #120 x 9   Entered by:   Hardin Negus, RMA   Authorized by:   Lenoria Farrier, MD, [facility]   Signed by:   Hardin Negus, RMA on 08/14/2010   Method used:   Electronically to        [facility] [address] [phone]* (retail)       [address]       [place], [place]  [address]       Ph: [phone] or [phone]       Fax: [phone]   RxID:   [id number]

## 2010-10-11 NOTE — Progress Notes (Signed)
        Additional Follow-up for Phone Call Additional follow up Details #2::    patient was a nos for monitor appt. was for 03/14/10 Follow-up by: Marcos Eke,  April 11, 2010 11:27 AM

## 2010-10-11 NOTE — Progress Notes (Signed)
Summary: REFILL altace done   Phone Note Refill Request Message from:  Patient on September 14, 2009 9:22 AM  Refills Requested: Medication #1:  RAMIPRIL 5 MG CAPS Take one capsule by mouth daily SENT TO [facility] [address] [phone]  Initial call taken by: Judie Grieve,  September 14, 2009 9:23 AM    Prescriptions: RAMIPRIL 5 MG CAPS (RAMIPRIL) Take one capsule by mouth daily  #30 x 5   Entered by:   Burnett Kanaris, CNA   Authorized by:   Lenoria Farrier, MD, [facility]   Signed by:   Burnett Kanaris, CNA on 09/14/2009   Method used:   Electronically to        [facility] [phone]* (retail)       [address]       [place], [place]  [address]       Ph: [phone]       Fax: [phone]   RxID:   [id number]

## 2010-10-11 NOTE — Assessment & Plan Note (Signed)
Summary: rov rsc from NOS on 7/21/lg  Medications Added CARVEDILOL 25 MG TABS (CARVEDILOL) 2 TABS two times a day      Allergies Added:   Visit Type:  Follow-up Primary Provider:  [facility]  CC:  cold no cardiac concerns.  History of Present Illness: The patient is 55 years old and return for management of CAD and nonischemic cardiopathy. She had a posterior MI in 2008 treated with a bare-metal stent to the circumflex artery. In 2010 she had cardiac catheterization which showed nonobstructive CAD and an ejection fraction of 40%. She was recently admitted with palpitations and had ventricular tachycardia on her monitor. An echocardiogram showed an ejection fraction of 35%. Her carvedilol was increased and she was discharged to have an event monitor although this did not happen.  Since discharge she has felt better and has had no recent palpitations. She had one episode where she felt real lightheaded for about 3 or 4 seconds and had to sit down.  Her past medical history significant for hyperlipidemia and previous common bile duct stone which was removed.  Current Medications (verified): 1)  Ramipril 5 Mg Caps (Ramipril) .... Take One Capsule By Mouth Daily 2)  Carvedilol 25 Mg Tabs (Carvedilol) .... 2 Tabs Two Times A Day 3)  Aspirin Adult Low Strength 81 Mg Tbec (Aspirin) .... Take One Tablet Once Daily 4)  Nitrostat 0.4 Mg Subl (Nitroglycerin) .Marland Kitchen.. 1 Tablet Under Tongue At Onset of Chest Pain; You May Repeat Every 5 Minutes For Up To 3 Doses. 5)  Lipitor 80 Mg Tabs (Atorvastatin Calcium) .... Take One Tablet By Mouth Daily. 6)  Tylenol 325 Mg Tabs (Acetaminophen) .... As Needed  Allergies (verified): 1)  ! * Plavix  Past History:  Past Medical History: Reviewed history from 02/07/2009 and no changes required.  1. Systolic heart failure with recent hospitalization, now compensated     and euvolemic. 2. 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 3.  Coronary artery disease status post prior diaphragmatic wall     infarction treated with a bare metal stent to circumflex artery in     2008. 4. Hyperlipidemia. 5. Recent retained common bile duct stone, removed with endoscopic     retrograde cholangiopancreatography.   Review of Systems       ROS is negative except as outlined in HPI.   Vital Signs:  Patient profile:   55 year old female Height:      69 inches Weight:      175 pounds BMI:     25.94 Pulse rate:   70 / minute BP sitting:   107 / 66  (left arm) Cuff size:   large  Vitals Entered By: Burnett Kanaris, CNA (May 21, 2010 2:07 PM)  Physical Exam  Additional Exam:  Gen. Well-nourished, in no distress   Neck: No JVD, thyroid not enlarged, no carotid bruits Lungs: No tachypnea, clear without rales, rhonchi or wheezes Cardiovascular: Rhythm regular, PMI not displaced,  heart sounds  normal, no murmurs or gallops, no peripheral edema, pulses normal in all 4 extremities. Abdomen: BS normal, abdomen soft and non-tender without masses or organomegaly, no hepatosplenomegaly. MS: No deformities, no cyanosis or clubbing   Neuro:  No focal sns   Skin:  no lesions    Impression & Recommendations:  Problem # 1:  VENTRICULAR TACHYCARDIA (ICD-427.1) She had a run of ventricular tachycardia in the hospital and an ejection fraction has been 40% and 35% on recent echo. She also  had an episode of lightheadedness causing her to have to sit down. Symptomatically she is better on higher doses of carvedilol. I'm still concerned about her risk of sudden cardiac death and will plan to obtain an event monitor. We'll see her back in 2 months. Her updated medication list for this problem includes:    Ramipril 5 Mg Caps (Ramipril) .Marland Kitchen... Take one capsule by mouth daily    Carvedilol 25 Mg Tabs (Carvedilol) .Marland Kitchen... 2 tabs two times a day    Aspirin Adult Low Strength 81 Mg Tbec (Aspirin) .Marland Kitchen... Take one tablet once daily    Nitrostat 0.4 Mg  Subl (Nitroglycerin) .Marland Kitchen... 1 tablet under tongue at onset of chest pain; you may repeat every 5 minutes for up to 3 doses.  Orders: Event (Event)  Problem # 2:  CAD, NATIVE VESSEL (ICD-414.01) She has had a previous posterior MI and has a bare-metal stent in the circumflex artery. She's had no chest pain this problem appears stable. Her updated medication list for this problem includes:    Ramipril 5 Mg Caps (Ramipril) .Marland Kitchen... Take one capsule by mouth daily    Carvedilol 25 Mg Tabs (Carvedilol) .Marland Kitchen... 2 tabs two times a day    Aspirin Adult Low Strength 81 Mg Tbec (Aspirin) .Marland Kitchen... Take one tablet once daily    Nitrostat 0.4 Mg Subl (Nitroglycerin) .Marland Kitchen... 1 tablet under tongue at onset of chest pain; you may repeat every 5 minutes for up to 3 doses.  Problem # 3:  HYPERLIPIDEMIA-MIXED (ICD-272.4) This is being managed with Lipitor. Her updated medication list for this problem includes:    Lipitor 80 Mg Tabs (Atorvastatin calcium) .Marland Kitchen... Take one tablet by mouth daily.  Other Orders: EKG w/ Interpretation (93000)  Patient Instructions: 1)  Your physician recommends that you schedule a follow-up appointment in: 2 MONTHS WITH DR Juanda Chance 2)  Your physician recommends that you continue on your current medications as directed. Please refer to the Current Medication list given to you today. 3)  MAY TAKE ROBITTUSIN DM OTC AS DIRECTED ON BOTTLE PER DR BRODIE 4)  Your physician has recommended that you wear an event monitor.  Event monitors are medical devices that record the heart's electrical activity. Doctors most often use these monitors to diagnose arrhythmias. Arrhythmias are problems with the speed or rhythm of the heartbeat. The monitor is a small, portable device. You can wear one while you do your normal daily activities. This is usually used to diagnose what is causing palpitations/syncope (passing out).

## 2010-10-11 NOTE — Assessment & Plan Note (Signed)
Summary: f80m  Medications Added CARVEDILOL 25 MG TABS (CARVEDILOL) Take one and 1/2 tablets by mouth twice a day        Visit Type:  Follow-up Primary Provider:  [facility]   History of Present Illness: The patient is 55 years old and return for management of CAD. She had an inferior MI in 2008 treated with a bare-metal stent to the circumflex artery. She had catheterization done in 2010 and her stent was patent. She has an ejection fraction of 40% and she has a history of systolic CHF.  She had an episode of right-sided chest pain a month ago but this resolved she has had no recurrence. She's had no shortness of breath or palpitations.  Her history is significant for hyperlipidemia and previous cholecystectomy and later a retained stone.  Current Medications (verified): 1)  Ramipril 5 Mg Caps (Ramipril) .... Take One Capsule By Mouth Daily 2)  Carvedilol 25 Mg Tabs (Carvedilol) .... Take One and 1/2 Tablets By Mouth Twice A Day 3)  Aspirin Adult Low Strength 81 Mg Tbec (Aspirin) .... Take One Tablet Once Daily 4)  Nitrostat 0.4 Mg Subl (Nitroglycerin) .Marland Kitchen.. 1 Tablet Under Tongue At Onset of Chest Pain; You May Repeat Every 5 Minutes For Up To 3 Doses. 5)  Lipitor 80 Mg Tabs (Atorvastatin Calcium) .... Take One Tablet By Mouth Daily. 6)  Tylenol 325 Mg Tabs (Acetaminophen) .... As Needed  Allergies: 1)  ! * Plavix  Past History:  Past Medical History: Reviewed history from 02/07/2009 and no changes required.  1. Systolic heart failure with recent hospitalization, now compensated     and euvolemic. 2. 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 3. Coronary artery disease status post prior diaphragmatic wall     infarction treated with a bare metal stent to circumflex artery in     2008. 4. Hyperlipidemia. 5. Recent retained common bile duct stone, removed with endoscopic     retrograde cholangiopancreatography.   Review of Systems       ROS is  negative except as outlined in HPI.   Vital Signs:  Patient profile:   55 year old female Height:      69 inches Weight:      179 pounds Pulse rate:   68 / minute Pulse rhythm:   regular BP sitting:   130 / 78  (left arm)  Vitals Entered By: Jacquelin Hawking, CMA (February 15, 2010 10:15 AM)  Physical Exam  Additional Exam:  Gen. Well-nourished, in no distress   Neck: No JVD, thyroid not enlarged, no carotid bruits Lungs: No tachypnea, clear without rales, rhonchi or wheezes Cardiovascular: Rhythm regular, PMI not displaced,  heart sounds  normal, no murmurs or gallops, no peripheral edema, pulses normal in all 4 extremities. Abdomen: BS normal, abdomen soft and non-tender without masses or organomegaly, no hepatosplenomegaly. MS: No deformities, no cyanosis or clubbing   Neuro:  No focal sns   Skin:  no lesions    Impression & Recommendations:  Problem # 1:  CAD, NATIVE VESSEL (ICD-414.01) She had a previous inferior MI with a bare-metal stent to the circumflex artery and a patent stented In 2010. She's had no recent angina scar her stable.  Her ejection fraction is 40%. Her updated medication list for this problem includes:    Ramipril 5 Mg Caps (Ramipril) .Marland Kitchen... Take one capsule by mouth daily    Carvedilol 25 Mg Tabs (Carvedilol) .Marland Kitchen... Take one and 1/2 tablets by mouth  twice a day    Aspirin Adult Low Strength 81 Mg Tbec (Aspirin) .Marland Kitchen... Take one tablet once daily    Nitrostat 0.4 Mg Subl (Nitroglycerin) .Marland Kitchen... 1 tablet under tongue at onset of chest pain; you may repeat every 5 minutes for up to 3 doses.  Orders: EKG w/ Interpretation (93000)  Problem # 2:  HYPERTENSION, BENIGN (ICD-401.1) This is well-controlled on current therapy. Her updated medication list for this problem includes:    Ramipril 5 Mg Caps (Ramipril) .Marland Kitchen... Take one capsule by mouth daily    Carvedilol 25 Mg Tabs (Carvedilol) .Marland Kitchen... Take one and 1/2 tablets by mouth twice a day    Aspirin Adult Low Strength 81  Mg Tbec (Aspirin) .Marland Kitchen... Take one tablet once daily  Problem # 3:  HYPERLIPIDEMIA-MIXED (ICD-272.4) She indicated this was evaluated he'll serve recently. Her updated medication list for this problem includes:    Lipitor 80 Mg Tabs (Atorvastatin calcium) .Marland Kitchen... Take one tablet by mouth daily.  Patient Instructions: 1)  Your physician wants you to follow-up in: 6 months.  You will receive a reminder letter in the mail two months in advance. If you don't receive a letter, please call our office to schedule the follow-up appointment. 2)  Your physician recommends that you continue on your current medications as directed. Please refer to the Current Medication list given to you today. Prescriptions: RAMIPRIL 5 MG CAPS (RAMIPRIL) Take one capsule by mouth daily  #30 x 11   Entered by:   Sherri Rad, RN, BSN   Authorized by:   Lenoria Farrier, MD, [facility]   Signed by:   Sherri Rad, RN, BSN on 02/15/2010   Method used:   Electronically to        [facility] [phone]* (retail)       [address]       [place], [place]  [address]       Ph: [phone]       Fax: [phone]   RxID:   [id number]

## 2010-10-11 NOTE — Miscellaneous (Signed)
  Clinical Lists Changes  Observations: Added new observation of ECHOINTERP:  Impressions:    - Mildly dilated LV with moderate systolic dysfunction, EF 35%, with     posterior, inferior, and basal anterolateral severe hypokinesis to     akinesis. Moderate diastolic dysfunction with evidence for     increased LV filling pressure. Normal RV size and systolic     function. (03/05/2010 10:46) Added new observation of CT SCAN:   CT ANGIOGRAPHY CHEST WITH CONTRAST    IMPRESSION:   No evidence of pulmonary embolus.    Mild COPD.    Mild cardiomegaly.  (03/05/2010 10:44) Added new observation of CXR RESULTS:    IMPRESSION:   Cardiomegaly.  Mild bronchitic changes.  (03/04/2010 10:45)      CT Scan  Procedure date:  03/05/2010  Findings:        CT ANGIOGRAPHY CHEST WITH CONTRAST    IMPRESSION:   No evidence of pulmonary embolus.    Mild COPD.    Mild cardiomegaly.   CXR  Procedure date:  03/04/2010  Findings:         IMPRESSION:   Cardiomegaly.  Mild bronchitic changes.   Echocardiogram  Procedure date:  03/05/2010  Findings:       Impressions:    - Mildly dilated LV with moderate systolic dysfunction, EF 35%, with     posterior, inferior, and basal anterolateral severe hypokinesis to     akinesis. Moderate diastolic dysfunction with evidence for     increased LV filling pressure. Normal RV size and systolic     function.

## 2010-10-11 NOTE — Progress Notes (Signed)
Summary: event monitor  Phone Note Other Incoming   Caller: [employer] Summary of Call: [employer] called to advise Patient did not have insurance and that she refused self pay.  Financial hardship papers in process, but acct for event monitor has been de-activated until further notice. Stanton Kidney, EMT-P  June 13, 2010 12:19 PM

## 2010-11-25 LAB — BRAIN NATRIURETIC PEPTIDE: Pro B Natriuretic peptide (BNP): <30 pg/mL (ref 0.0–100.0)

## 2010-11-25 LAB — POCT I-STAT, CHEM 8
Creatinine, Ser: 0.9 mg/dL (ref 0.4–1.2)
Glucose, Bld: 99 mg/dL (ref 70–99)
Hemoglobin: 16.7 g/dL — ABNORMAL HIGH (ref 12.0–15.0)
Potassium: 3.9 mEq/L (ref 3.5–5.1)

## 2010-11-25 LAB — POCT CARDIAC MARKERS
CKMB, poc: 1.4 ng/mL (ref 1.0–8.0)
CKMB, poc: 1.4 ng/mL (ref 1.0–8.0)
Myoglobin, poc: 41 ng/mL (ref 12–200)
Myoglobin, poc: 44.3 ng/mL (ref 12–200)

## 2010-11-25 LAB — CBC
Hemoglobin: 14.8 g/dL (ref 12.0–15.0)
MCH: 31.3 pg (ref 26.0–34.0)
MCHC: 34 g/dL (ref 30.0–36.0)
Platelets: 174 10*3/uL (ref 150–400)
RDW: 13.7 % (ref 11.5–15.5)

## 2010-11-25 LAB — URINALYSIS, ROUTINE W REFLEX MICROSCOPIC
Bilirubin Urine: NEGATIVE
Hgb urine dipstick: NEGATIVE
Protein, ur: NEGATIVE mg/dL
Specific Gravity, Urine: 1.006 (ref 1.005–1.030)
Urobilinogen, UA: 0.2 mg/dL (ref 0.0–1.0)

## 2010-11-25 LAB — PROTIME-INR: Prothrombin Time: 13 seconds (ref 11.6–15.2)

## 2010-11-25 LAB — CARDIAC PANEL(CRET KIN+CKTOT+MB+TROPI)
Relative Index: INVALID (ref 0.0–2.5)
Total CK: 70 U/L (ref 7–177)
Troponin I: 0.02 ng/mL (ref 0.00–0.06)

## 2010-11-25 LAB — COMPREHENSIVE METABOLIC PANEL
ALT: 17 U/L (ref 0–35)
Alkaline Phosphatase: 67 U/L (ref 39–117)
BUN: 7 mg/dL (ref 6–23)
CO2: 23 mEq/L (ref 19–32)
GFR calc non Af Amer: >60 mL/min (ref >60)
Glucose, Bld: 67 mg/dL — ABNORMAL LOW (ref 70–99)
Potassium: 4.1 mEq/L (ref 3.5–5.1)
Sodium: 138 mEq/L (ref 135–145)

## 2010-11-25 LAB — CK TOTAL AND CKMB (NOT AT ARMC): Relative Index: INVALID (ref 0.0–2.5)

## 2010-11-25 LAB — LIPID PANEL
Cholesterol: 138 mg/dL (ref 0–200)
Total CHOL/HDL Ratio: 2.8 RATIO
VLDL: 29 mg/dL (ref 0–40)

## 2010-11-25 LAB — TROPONIN I: Troponin I: 0.03 ng/mL (ref 0.00–0.06)

## 2010-12-11 LAB — DIFFERENTIAL
Basophils Absolute: 0.1 10*3/uL (ref 0.0–0.1)
Basophils Relative: 1 % (ref 0–1)
Eosinophils Absolute: 0.5 10*3/uL (ref 0.0–0.7)
Lymphs Abs: 3 10*3/uL (ref 0.7–4.0)
Neutrophils Relative %: 43 % (ref 43–77)

## 2010-12-11 LAB — COMPREHENSIVE METABOLIC PANEL
ALT: <8 U/L (ref 0–35)
Alkaline Phosphatase: 56 U/L (ref 39–117)
CO2: 27 mEq/L (ref 19–32)
Calcium: 9.3 mg/dL (ref 8.4–10.5)
GFR calc non Af Amer: >60 mL/min (ref >60)
Glucose, Bld: 109 mg/dL — ABNORMAL HIGH (ref 70–99)
Sodium: 138 mEq/L (ref 135–145)

## 2010-12-11 LAB — POCT CARDIAC MARKERS: Myoglobin, poc: 50.7 ng/mL (ref 12–200)

## 2010-12-11 LAB — CBC
Hemoglobin: 15 g/dL (ref 12.0–15.0)
MCHC: 33.9 g/dL (ref 30.0–36.0)
MCV: 90.9 fL (ref 78.0–100.0)
RBC: 4.87 MIL/uL (ref 3.87–5.11)

## 2010-12-12 LAB — CBC
MCHC: 34.1 g/dL (ref 30.0–36.0)
RDW: 14 % (ref 11.5–15.5)

## 2010-12-12 LAB — PROTIME-INR
INR: 1.02 (ref 0.00–1.49)
Prothrombin Time: 13.3 seconds (ref 11.6–15.2)

## 2010-12-12 LAB — BASIC METABOLIC PANEL
BUN: 4 mg/dL — ABNORMAL LOW (ref 6–23)
CO2: 25 mEq/L (ref 19–32)
Calcium: 9.3 mg/dL (ref 8.4–10.5)
Creatinine, Ser: 0.66 mg/dL (ref 0.4–1.2)
Glucose, Bld: 83 mg/dL (ref 70–99)

## 2010-12-13 LAB — TSH: TSH: 1.553 u[IU]/mL (ref 0.350–4.500)

## 2010-12-13 LAB — DIFFERENTIAL
Basophils Absolute: 0.1 10*3/uL (ref 0.0–0.1)
Eosinophils Absolute: 0.3 10*3/uL (ref 0.0–0.7)
Lymphocytes Relative: 52 % — ABNORMAL HIGH (ref 12–46)
Lymphs Abs: 3 10*3/uL (ref 0.7–4.0)
Monocytes Relative: 7 % (ref 3–12)

## 2010-12-13 LAB — COMPREHENSIVE METABOLIC PANEL
AST: 19 U/L (ref 0–37)
Albumin: 4 g/dL (ref 3.5–5.2)
Alkaline Phosphatase: 59 U/L (ref 39–117)
BUN: 5 mg/dL — ABNORMAL LOW (ref 6–23)
Chloride: 102 mEq/L (ref 96–112)
Creatinine, Ser: 0.6 mg/dL (ref 0.4–1.2)
GFR calc Af Amer: >60 mL/min (ref >60)
Potassium: 3.9 mEq/L (ref 3.5–5.1)
Total Protein: 7.3 g/dL (ref 6.0–8.3)

## 2010-12-13 LAB — CARDIAC PANEL(CRET KIN+CKTOT+MB+TROPI)
CK, MB: 1.7 ng/mL (ref 0.3–4.0)
Relative Index: INVALID (ref 0.0–2.5)
Total CK: 58 U/L (ref 7–177)
Troponin I: 0.01 ng/mL (ref 0.00–0.06)

## 2010-12-13 LAB — CBC
HCT: 49.2 % — ABNORMAL HIGH (ref 36.0–46.0)
MCHC: 34.5 g/dL (ref 30.0–36.0)
Platelets: ADEQUATE 10*3/uL (ref 150–400)
RDW: 14.5 % (ref 11.5–15.5)

## 2010-12-13 LAB — LIPID PANEL
LDL Cholesterol: 82 mg/dL (ref 0–99)
Total CHOL/HDL Ratio: 3.4 RATIO
Triglycerides: 91 mg/dL (ref <150)
VLDL: 18 mg/dL (ref 0–40)

## 2010-12-13 LAB — POCT CARDIAC MARKERS
Myoglobin, poc: 25.7 ng/mL (ref 12–200)
Myoglobin, poc: 66 ng/mL (ref 12–200)
Troponin i, poc: <0.05 ng/mL (ref 0.00–0.09)

## 2010-12-13 LAB — MAGNESIUM: Magnesium: 1.7 mg/dL (ref 1.5–2.5)

## 2010-12-13 LAB — BASIC METABOLIC PANEL
BUN: 5 mg/dL — ABNORMAL LOW (ref 6–23)
CO2: 24 mEq/L (ref 19–32)
Calcium: 9.9 mg/dL (ref 8.4–10.5)
GFR calc non Af Amer: >60 mL/min (ref >60)
Glucose, Bld: 91 mg/dL (ref 70–99)

## 2010-12-13 LAB — CK TOTAL AND CKMB (NOT AT ARMC)
Relative Index: INVALID (ref 0.0–2.5)
Total CK: 63 U/L (ref 7–177)

## 2010-12-15 LAB — POCT CARDIAC MARKERS
CKMB, poc: <1 ng/mL — ABNORMAL LOW (ref 1.0–8.0)
CKMB, poc: <1 ng/mL — ABNORMAL LOW (ref 1.0–8.0)
CKMB, poc: <1 ng/mL — ABNORMAL LOW (ref 1.0–8.0)
Myoglobin, poc: 23.5 ng/mL (ref 12–200)
Myoglobin, poc: 26.8 ng/mL (ref 12–200)
Myoglobin, poc: 32.7 ng/mL (ref 12–200)
Troponin i, poc: <0.05 ng/mL (ref 0.00–0.09)
Troponin i, poc: <0.05 ng/mL (ref 0.00–0.09)
Troponin i, poc: <0.05 ng/mL (ref 0.00–0.09)

## 2010-12-15 LAB — DIFFERENTIAL
Lymphocytes Relative: 57 % — ABNORMAL HIGH (ref 12–46)
Lymphs Abs: 4.9 10*3/uL — ABNORMAL HIGH (ref 0.7–4.0)
Monocytes Relative: 6 % (ref 3–12)
Neutro Abs: 2.6 10*3/uL (ref 1.7–7.7)
Neutrophils Relative %: 31 % — ABNORMAL LOW (ref 43–77)

## 2010-12-15 LAB — CBC
Platelets: 174 10*3/uL (ref 150–400)
RBC: 5.07 MIL/uL (ref 3.87–5.11)
WBC: 8.5 10*3/uL (ref 4.0–10.5)

## 2010-12-15 LAB — TROPONIN I: Troponin I: 0.02 ng/mL (ref 0.00–0.06)

## 2010-12-15 LAB — BASIC METABOLIC PANEL
BUN: 6 mg/dL (ref 6–23)
Creatinine, Ser: 0.69 mg/dL (ref 0.4–1.2)
GFR calc Af Amer: >60 mL/min (ref >60)
GFR calc non Af Amer: >60 mL/min (ref >60)

## 2010-12-18 LAB — URINALYSIS, ROUTINE W REFLEX MICROSCOPIC
Protein, ur: NEGATIVE mg/dL
Urobilinogen, UA: 0.2 mg/dL (ref 0.0–1.0)

## 2010-12-18 LAB — POCT CARDIAC MARKERS
CKMB, poc: <1 ng/mL — ABNORMAL LOW (ref 1.0–8.0)
Myoglobin, poc: 30.7 ng/mL (ref 12–200)
Troponin i, poc: <0.05 ng/mL (ref 0.00–0.09)

## 2010-12-18 LAB — BASIC METABOLIC PANEL
BUN: 7 mg/dL (ref 6–23)
GFR calc Af Amer: >60 mL/min (ref >60)
GFR calc non Af Amer: >60 mL/min (ref >60)
Potassium: 3.9 mEq/L (ref 3.5–5.1)
Sodium: 138 mEq/L (ref 135–145)

## 2010-12-18 LAB — POCT I-STAT, CHEM 8
Hemoglobin: 15.6 g/dL — ABNORMAL HIGH (ref 12.0–15.0)
Sodium: 142 mEq/L (ref 135–145)
TCO2: 26 mmol/L (ref 0–100)

## 2010-12-18 LAB — CBC
HCT: 43.6 % (ref 36.0–46.0)
Platelets: 186 10*3/uL (ref 150–400)
RBC: 4.86 MIL/uL (ref 3.87–5.11)
WBC: 9.2 10*3/uL (ref 4.0–10.5)

## 2010-12-18 LAB — DIFFERENTIAL
Eosinophils Relative: 6 % — ABNORMAL HIGH (ref 0–5)
Lymphocytes Relative: 34 % (ref 12–46)
Lymphs Abs: 3.1 10*3/uL (ref 0.7–4.0)

## 2010-12-18 LAB — TROPONIN I: Troponin I: 0.01 ng/mL (ref 0.00–0.06)

## 2010-12-18 LAB — PROTIME-INR
INR: 1 (ref 0.00–1.49)
Prothrombin Time: 13.6 seconds (ref 11.6–15.2)

## 2010-12-24 LAB — CK TOTAL AND CKMB (NOT AT ARMC)
CK, MB: 1.6 ng/mL (ref 0.3–4.0)
CK, MB: 1.6 ng/mL (ref 0.3–4.0)
Total CK: 117 U/L (ref 7–177)

## 2010-12-24 LAB — COMPREHENSIVE METABOLIC PANEL
ALT: 477 U/L — ABNORMAL HIGH (ref 0–35)
Albumin: 3.7 g/dL (ref 3.5–5.2)
Alkaline Phosphatase: 136 U/L — ABNORMAL HIGH (ref 39–117)
Alkaline Phosphatase: 60 U/L (ref 39–117)
BUN: 4 mg/dL — ABNORMAL LOW (ref 6–23)
CO2: 27 mEq/L (ref 19–32)
Chloride: 100 mEq/L (ref 96–112)
Chloride: 109 mEq/L (ref 96–112)
Creatinine, Ser: 0.53 mg/dL (ref 0.4–1.2)
GFR calc non Af Amer: >60 mL/min (ref >60)
GFR calc non Af Amer: >60 mL/min (ref >60)
Glucose, Bld: 100 mg/dL — ABNORMAL HIGH (ref 70–99)
Glucose, Bld: 97 mg/dL (ref 70–99)
Potassium: 3.8 mEq/L (ref 3.5–5.1)
Potassium: 4.1 mEq/L (ref 3.5–5.1)
Sodium: 137 mEq/L (ref 135–145)
Total Bilirubin: 0.5 mg/dL (ref 0.3–1.2)
Total Bilirubin: 0.7 mg/dL (ref 0.3–1.2)

## 2010-12-24 LAB — CBC
HCT: 43.5 % (ref 36.0–46.0)
HCT: 45.6 % (ref 36.0–46.0)
Hemoglobin: 14.6 g/dL (ref 12.0–15.0)
MCHC: 33.5 g/dL (ref 30.0–36.0)
MCV: 89.1 fL (ref 78.0–100.0)
Platelets: 162 10*3/uL (ref 150–400)
Platelets: 163 10*3/uL (ref 150–400)
RDW: 13.6 % (ref 11.5–15.5)
RDW: 14 % (ref 11.5–15.5)

## 2010-12-24 LAB — DIFFERENTIAL
Basophils Absolute: 0 10*3/uL (ref 0.0–0.1)
Basophils Relative: 0 % (ref 0–1)
Lymphocytes Relative: 48 % — ABNORMAL HIGH (ref 12–46)
Neutro Abs: 3.4 10*3/uL (ref 1.7–7.7)
Neutrophils Relative %: 41 % — ABNORMAL LOW (ref 43–77)

## 2010-12-24 LAB — URINALYSIS, ROUTINE W REFLEX MICROSCOPIC
Hgb urine dipstick: NEGATIVE
Nitrite: NEGATIVE
Protein, ur: NEGATIVE mg/dL
Specific Gravity, Urine: 1.008 (ref 1.005–1.030)
Urobilinogen, UA: 0.2 mg/dL (ref 0.0–1.0)

## 2010-12-24 LAB — TROPONIN I: Troponin I: <0.01 ng/mL (ref 0.00–0.06)

## 2010-12-24 LAB — CARDIAC PANEL(CRET KIN+CKTOT+MB+TROPI)
CK, MB: 1.6 ng/mL (ref 0.3–4.0)
Total CK: 63 U/L (ref 7–177)
Total CK: 72 U/L (ref 7–177)
Troponin I: <0.01 ng/mL (ref 0.00–0.06)

## 2010-12-24 LAB — HEPATIC FUNCTION PANEL
ALT: 489 U/L — ABNORMAL HIGH (ref 0–35)
AST: 1073 U/L — ABNORMAL HIGH (ref 0–37)
Albumin: 3.9 g/dL (ref 3.5–5.2)
Alkaline Phosphatase: 111 U/L (ref 39–117)
Bilirubin, Direct: 0.2 mg/dL (ref 0.0–0.3)
Bilirubin, Direct: 0.5 mg/dL — ABNORMAL HIGH (ref 0.0–0.3)
Indirect Bilirubin: 0.8 mg/dL (ref 0.3–0.9)
Total Bilirubin: 1 mg/dL (ref 0.3–1.2)
Total Bilirubin: 1.3 mg/dL — ABNORMAL HIGH (ref 0.3–1.2)

## 2010-12-24 LAB — BRAIN NATRIURETIC PEPTIDE: Pro B Natriuretic peptide (BNP): 270 pg/mL — ABNORMAL HIGH (ref 0.0–100.0)

## 2010-12-24 LAB — D-DIMER, QUANTITATIVE: D-Dimer, Quant: <0.22 ug/mL-FEU (ref 0.00–0.48)

## 2010-12-24 LAB — BASIC METABOLIC PANEL
BUN: 7 mg/dL (ref 6–23)
Creatinine, Ser: 0.55 mg/dL (ref 0.4–1.2)
GFR calc non Af Amer: >60 mL/min (ref >60)
Glucose, Bld: 99 mg/dL (ref 70–99)
Potassium: 3.7 mEq/L (ref 3.5–5.1)

## 2010-12-24 LAB — MAGNESIUM: Magnesium: 1.9 mg/dL (ref 1.5–2.5)

## 2011-01-22 NOTE — Op Note (Signed)
[name], [name]              ACCOUNT NO.:  [id number]   MEDICAL RECORD NO.:  [id number]          PATIENT TYPE:  AMB   LOCATION:  SDS                          FACILITY:  [facility]   PHYSICIAN:  Ardeth Sportsman, MD     DATE OF BIRTH:  June 09, 1956   DATE OF PROCEDURE:  DATE OF DISCHARGE:                               OPERATIVE REPORT   PRIMARY CARE PHYSICIAN:  Karene Fry Day, MD   CARDIOLOGIST:  Everardo Beals. Juanda Chance, MD, FACC   SURGEON:  Ardeth Sportsman, MD   ASSISTANT:  Felicity Pellegrini   PREOPERATIVE DIAGNOSIS:  Symptomatic cholecystolithiasis, possible  cholelithiasis.   POSTOPERATIVE DIAGNOSIS:  Acute-on-chronic cholecystitis and  cholecystolithiasis.   PROCEDURE:  Laparoscopic cholecystectomy.   SPECIMEN:  Gallbladder.   DRAINS:  None.   ESTIMATED BLOOD LOSS:  Less than 10 ml.   COMPLICATIONS:  None.   INDICATION:  [name] is a pleasant 55 year old female who has had  classic symptoms of biliary colic.  She had some cardiac issues that had  been sorted out by Dr. Dickie La and has had a moderately decreased cardiac  ejection fraction that was cleared by Dr. Dickie La for surgery.   Anatomy and physiology of gallbladder dysfunction was discussed.  Options were discussed.  A recommendation was made for laparoscopic  cholecystectomy with intraoperative cholangiogram.   The risks of stroke, heart attack, deep venous thrombosis, pulmonary  embolism and death were discussed.  The risks such as bleeding,  transfusion, wound infection, abscess, injury to the organs, prolonged  pain and other risks were discussed.  Questions answered.  She agreed to  proceed.   PERIOPERATIVE FINDINGS:  She had markedly gallbladder wall thickening  with white bile within it.  She had numerous large stones impacted in  her infundibulum.  She had a very short cystic duct.  We were unable to  do the cholangiogram secondary to the cystic duct being extremely short  and full of stones making the risk of common  bile duct injury much  higher trying to be more aggressive.  She had evidence of some adhesions  of her liver to the anterior abdominal wall ? Fitz-Hugh-Curtis  (syndrome).   DESCRIPTION OF PROCEDURE:  Informed consent was confirmed.  The patient  underwent anesthesia without any difficulty.  She had voided just prior  to going to the operating room.  She was on sequential compressive  devices enacted during the entire case.  She was in the supine with both  arms tucked.   Her abdomen was prepped and draped in a sterile fashion.  A 5 mm port  was placed in the right upper quadrant using optical entry technique  with the patient in steep  reverse Trendelenburg and right side up.  Capnoperitoneum 15 mmHg provided good intra-abdominal insufflation.  On  direct visualization, 5 mm ports were placed in the right flank and  umbilicus.  A 10 mm port was tunneled through the falciform ligament in  the subxiphoid region.   The gallbladder fundus was grasped and elevated anteriorly.  Peritoneal  coverage between the gallbladder and the anterior,  medial and posterior  aspects were freed off to free the proximal half of the gallbladder off  the liver bed.  She had thin attachments from her gallbladder to her  liver bed but down around the infundibulum, and it was very dense and  inflamed.  She had some dense omental and duodenal adhesions, and these  were freed off primarily with controlled blunt dissection but as well as  some focused cautery.  Cautery was used to help free off the node of  Calot off the infundibulum using one clip on the gallbladder side and  two proximally.  Further crucial dissection was done to get a good  critical view.  I could see an anterior and posterior wall branch of the  cystic artery and 1 clip on the gallbladder side and 2 clips slightly  proximal were made on these, and they were transected through.  There  was a little bit of oozing since the gallbladder was  rather inflamed,  and it was hard to get good planes, and this was where there was some  bleeding, but after clipping the arterial branches it was quiet.  In  dissecting the infundibulum, it seemed to come down nearly all the way  down to the common bile duct which did not look inflamed and looked  uninvolved.  I was able to milk some of the larger stones back, but in  doing that, the gallbladder did have a tear at the infundibulum, and  there was some spillage of a few stones that were picked up through the  tear at the infundibulum.   A 5 [place] cholangiocatheter was placed through a right subcostal stab  incision and flushed and placed through the infundibulum and down to the  cystic duct.  However, I could not get it past the common bile duct.  We  could not get good occlusion around it as there was leaking of fluid  despite trying to get the catheter in there.  At this point, because she  did not have elevated liver function tests or any evidence of jaundice  or pancreatitis, we decided to abort on the cholangiogram since the risk  of common bile duct injury may go up since we were getting rather close  to it.  Therefore, the cholangiocatheter was removed.  She had a very  thickened dilated infundibulum and cystic duct.  Therefore, we freed the  gallbladder from its main attachments on the liver bed until everything  came down just to the infundibulum.  I used a 0 PDS Endoloop x2 to  ligate the cystic duct stump and infundibulum and gallbladder was freed  off at the level of the infundibulum.  A couple of few small gallstone  fragments were removed as well.  Gallbladder was placed in an EndoCatch  bag and removed out of the subxiphoid portal with no dilation needed.   Copious irrigation was done with over a liter of saline, and hemostasis  was excellent with the clips.  All small bile stone fragments were freed  off.  There was no evidence of any bleeding or other abnormality.   Duodenum and other organs looked normal as well.  The subxiphoid port  fascial defect was reapproximated using 0 Vicryl stitch using a  laparoscopic suture passer under direct visualization.  Capnoperitoneum  was evacuated.  Ports were removed.  The skin was closed with 4-0  Monocryl stitch.  Sterile dressings were applied.  The patient was  extubated and sent to the  recovery room in stable condition.   I am about to explain the operative findings with the patient's family,  but I discussed postoperative care with the patient just prior to  surgery.      Ardeth Sportsman, MD  Electronically Signed     SCG/MEDQ  D:  11/30/2007  T:  11/30/2007  Job:  [id number]   cc:   Karene Fry Day, MD  Everardo Beals Juanda Chance, MD, [facility]

## 2011-01-22 NOTE — Assessment & Plan Note (Signed)
[facility] HEALTHCARE                            CARDIOLOGY OFFICE NOTE   [name], [name]                     MRN:          [id number]  DATE:11/17/2007                            DOB:          Nov 09, 1955    PRIMARY CARE PHYSICIAN:  [facility].   GENERAL SURGEON:  Ardeth Sportsman, MD   CLINICAL HISTORY:  [name] is 55 years old and returns for followup  after a recent preop evaluation prior to gallbladder surgery.  She had  inferior wall myocardial infarction in June 2008 treated with a bare  metal stent to the circumflex artery.  We saw her in preop evaluation  prior to gallbladder surgery and did a heart scan on her. Ejection  fraction was 36%, and there was suggestion of inferior ischemia.  We did  a cardiac catheterization which showed that his stent was widely patent,  and her ejection fraction by catheter was estimated 35-40%.  We  subsequently did an echocardiogram where the ejection fraction was  estimated at 40-45%.   She has done well with no symptoms of chest pain, shortness breath or  palpitations.   PAST MEDICAL HISTORY:  Significant for:  1. Hyperlipidemia.  2. She also has an allergy to PLAVIX with a rash and was on Ticlid for      a period of time after her stent placement.   CURRENT MEDICATIONS:  1. Lisinopril 5 mg 1/2 tablet daily.  2. Lipitor 80 mg nightly.  3. Aspirin.  4. Metoprolol 25 mg b.i.d..   PHYSICAL EXAMINATION:  VITAL SIGNS:  Blood pressure was 102/60 with a  pulse of 58 and regular.  NECK:  There was no venous distension.  The carotid pulses were full  without bruits.  CHEST:  Was clear.  HEART:  Rhythm was regular.  No murmurs or gallops.  ABDOMEN:  There were soft, normal bowel sounds.  There was no  splenomegaly.  EXTREMITIES:  Peripheral pulses were full with no peripheral edema.   IMPRESSION:  1. Coronary artery disease status post posterior wall myocardial      infarction treated with a bare metal  stent to the circumflex artery      June 2007 with no evidence restenosis at followup catheterization.  2. Left ventricular dysfunction with ejection fraction of 36% by      Myoview scan, 35-40% by catheterization, and 40-45% by      echocardiogram.  3. Hyperlipidemia  4. History of tobacco use.  5. Allergy to PLAVIX.   RECOMMENDATIONS:  I think [name] is doing well at present.  We have  her on lisinopril and beta blocker in hopes to improve her LV function.  I think it is okay for her to proceed with gallbladder surgery which is  scheduled for November 30, 2007.  I told her we would be available for  problems.  I will see her back in 6 months.  Will plan to do an  echocardiogram prior to that visit to reevaluate her LV function.     [name], MD, [facility]  Electronically Signed    BRB/MedQ  DD: 11/17/2007  DT: 11/18/2007  Job #: [id number]

## 2011-01-22 NOTE — Assessment & Plan Note (Signed)
[facility] HEALTHCARE                            CARDIOLOGY OFFICE NOTE   [name], [name]                     MRN:          [id number]  DATE:03/20/2007                            DOB:          08/06/1956    PRIMARY CARDIOLOGIST:  Everardo Beals. Juanda Chance, MD, [facility].   PRIMARY CARE Erna Brossard:  [facility].   PATIENT PROFILE:  A 55 year old African American female with a recent  hospitalization at [facility] for acute inferior posterior ST  segment elevation MI, with stenting of the obtuse marginal, who presents  for hospital followup.   PROBLEM LIST:  1. CAD.      a.     Status post acute inferior posterior ST elevation MI on February 28, 2007, with a cardiac catheterization revealing 95% stenosis in       the first obtuse marginal which was successfully treated with a 3       x 20-mm Liberte bare metal stent.  She has a residual 90% stenosis       in a small second diagonal branch of the LAD which was medically       managed.  EF was 60% with posterior hypokinesis.  2. Hyperlipidemia.  3. Remote tobacco abuse, quit February 28, 2007.  4. Asymptomatic nocturnal bradycardia noted during hospitalization.  5. Drug rash.   ALLERGIES:  The patient appears to have developed a drug rash on PLAVIX.   HISTORY OF PRESENT ILLNESS:  A 55 year old African American female with  a recent hospitalization at [facility] from June 21 to June  25th for management of an acute ST elevation MI.  She underwent  successful stenting of the first obtuse marginal and diagonal disease  that was felt to be small vessel and was medically managed.  She  initially was managed with beta-blocker therapy, however, this ended up  being discontinued secondary to nocturnal bradycardia with asymptomatic  3-second pauses and baseline rates in the 40s to 50s.  Approximately one  week following discharge, on July 2nd, she noted a red slightly raised  rash under her breasts, over her  abdomen, and down into her inguinal  areas bilaterally.  It also extended into her axillary areas  bilaterally.  It was very itchy and she presented to the [facility] ED  on July 3rd, and was advised to take Benadryl and was also given a  prescription for prednisone taper.  She has been taking Benadryl with  relief of the itching, however, the rash does not appear to have  resolved much.  She has not been taking the prednisone for fear of its  side effects.   She otherwise has not had any chest pain, palpitations, shortness of  breath, PND, orthopnea, dizziness, syncope, edema, early satiety.   HOME MEDICATIONS:  1. Lisinopril 5 mg half tablet daily.  2. Lipitor 80 mg q.h.s.  3. Plavix 75 mg daily.  4. Aspirin 325 mg daily.  5. Nitroglycerin 0.4 mg sublingual p.r.n. chest pain.  6. Benadryl 25 mg p.r.n.   PHYSICAL EXAMINATION:  VITAL SIGNS:  Blood pressure 117/73, heart rate  82, respirations 16, weight is 185 pounds.  She is afebrile.  GENERAL:  A pleasant African American female in no acute distress.  Awake, alert, and oriented x3.  NECK:  No bruits or JVD.  LUNGS:  Respirations regular unlabored.  Clear to auscultation.  TORSO/ABDOMEN:  She has a diffuse erythematous and slightly raised rash  over her torso, under her breasts, over her abdomen, and into her  inguinal and axillary spaces.  She has minimal skin peeling and no  blistering.  Abdomen is nontender, nondistended.  Bowel sounds present  x4.  EXTREMITIES: Warm, dry.  No clubbing, cyanosis, or edema.  Dorsalis  pedis and posterior tibial pulses 2+ and equal bilaterally.  The right  groin is without bleeding, bruising, hematoma.   ACCESSORY CLINICAL FINDINGS:  EKG shows a sinus rhythm with frequent  PVCs and inferior Qs.   ASSESSMENT/PLAN:  1. Coronary artery disease, status post acute inferior posterior ST      elevation myocardial infarction.  She is status post stenting of      the obtuse marginal with a Liberte  bare metal stent.  She has had      no chest pain or shortness of breath and continues on aspirin,      Statin, and ACE inhibitor therapy.  Notably, beta-blocker was      discontinued during the hospitalization secondary to bradycardia      with rates into the 40s during periods of the day as well as down      to the 30s with 3-second pauses during sleep.  Ms. Friedhoff is      currently on Plavix which after discussion with Dr. Daleen Squibb, we      believe is the culprit for a drug-rash which she is exhibiting      since July 2nd.  For this reason and also given that she has a bare      metal stent, we will discontinue her Plavix and have her initiate      Ticlid 250 mg b.i.d. for the next 2 weeks to complete a total of 1      month of antiplatelet therapy.  She is advised to continue aspirin      following this.  Although she had an myocardial infarction with      data to support the use of Plavix for 9 months following acute      coronary syndrome, there is not similar data for Ticlid therapy and      therefore we will stop this after a total of 1 month of      antiplatelet therapy.  We will check a CBC next week since we are      initiating Ticlid.  2. Drug rash.  See number one.  Discontinue Plavix and initiate      Ticlid.  3. Hyperlipidemia.  Continue Statin therapy.  She will have follow up      LFTs in approximately 8 weeks.  4. Remote tobacco abuse.  She recently quit and continues cessation      advised.   DISPOSITION:  Follow up with Dr. Juanda Chance in approximately 3 months.      [name], ANP  Electronically Signed      Jesse Sans. Daleen Squibb, MD, [facility]  Electronically Signed   CB/MedQ  DD: 03/20/2007  DT: 03/21/2007  Job #: [id number]

## 2011-01-22 NOTE — Assessment & Plan Note (Signed)
[facility] HEALTHCARE                            CARDIOLOGY OFFICE NOTE   ROYALTY, FAKHOURI                     MRN:          [id number]  DATE:09/09/2007                            DOB:          02/26/1956    REFERRING PHYSICIAN:  Ardeth Sportsman, MD   PRIMARY CARE PHYSICIAN:  [facility].   REASON FOR REFERRAL:  Preoperative evaluation prior to gallbladder  surgery.   CLINICAL HISTORY:  [name] is 55 years old and is referred by Dr.  Michaell Cowing for preoperative evaluation prior to gallbladder surgery.  In June  of last year she was admitted the hospital with acute inferior posterior  wall myocardial infarction and treated with a bare metal Liberte stent  to a subtotally occluded circumflex artery.  She had residual 90%  stenosis in a small diagonal branch which was managed medically.  Ejection fraction was 60%.  She developed an allergy to PLAVIX with skin  rash and this had to be stopped, and she continued Ticlid for a full  month's course and then stopped this.   She had done quite well since then from a cardiac standpoint.  The  patient has had no recent chest pain, shortness of breath, or  palpitations.   She recently has had some mild right upper quadrant pain and was found  to have gallstones.  She has been seen by Dr. Michaell Cowing who has recommended  laparoscopic cholecystectomy.   PAST MEDICAL HISTORY:  Significant for hyperlipidemia.  She has had no  surgeries.   CURRENT MEDICATIONS:  1. Lisinopril 5 mg 1/2 tablet daily.  2. Lipitor 80 mg daily.  3. Aspirin 325 mg.  4. Metoprolol 25 mg b.i.d.   SOCIAL HISTORY:  She works at [employer], Radiation protection practitioner.  She  is not married and lives by herself.  She is still smoking some  cigarettes but has cut back quite a bit.   FAMILY HISTORY:  Negative for cardiovascular disease.  Her mother died  of cervical cancer.   REVIEW OF SYSTEMS:  Positive for recent sinus infection for which she  had recently completed a course of amoxicillin.   EXAMINATION:  There was no venous tension.  The carotid pulses were full  without bruits.  Blood pressure 130/80 and pulse 66 and regular.  CHEST:  Clear.  CARDIAC:  Rhythm was regular.  Heart sounds were normal.  I could hear  no murmurs or gallops.  ABDOMEN:  Soft.  Normal bowel sounds.  No hepatomegaly and no pulsatile  masses.  The peripheral pulses were full and no peripheral edema.  MUSCULOSKELETAL:  Showed no deformities.  The skin was warm and dry.  The neurological examination showed no focal neurological signs.   An electrocardiogram showed inferior and lateral T-wave changes.  These  have not changed since her last ECG.   IMPRESSION:  1. Pre-op evaluation prior to gallbladder surgery for cholelithiasis.  2. Coronary artery disease status post the inferior - posterior wall      myocardial infarction treated with a bare metal stent to the      circumflex artery June  2008.  3. Good left ventricular function.  4. Hyperlipidemia.  5. Tobacco use.  6. Allergy to PLAVIX.   RECOMMENDATIONS:  I think [name] is doing well from cardiac  standpoint.  Will plan to do a rest-stress Myoview scan to screen her  prior to her upcoming gallbladder surgery.  Will also get a fasting  lipid profile, BMP, and CBC when she comes in for her scan.  If all this  is negative, we will clear her for gallbladder surgery and will plan to  see her back in 6 months.     [name], MD, [facility]  Electronically Signed    BRB/MedQ  DD: 09/09/2007  DT: 09/09/2007  Job #: [id number]   cc:   Ardeth Sportsman, MD  [facility]

## 2011-01-22 NOTE — Assessment & Plan Note (Signed)
[facility]                            CARDIOLOGY OFFICE NOTE   THOMAS, RHUDE                     MRN:          [id number]  DATE:05/04/2008                            DOB:          08-03-1956    PRIMARY CARE PHYSICIAN:  [facility].   REFERRING PHYSICIAN:  Markham Jordan L. Effie Shy, MD   CLINICAL HISTORY:  Ms. Lanius is 55 years old and is referred by Dr.  Effie Shy for evaluation of dizziness and cardiac arrhythmia.  Ms. Pilson  had an inferior wall myocardial infarction in June 2008, treated with a  bare-metal stent to the circumflex artery.  She had an ejection fraction  on the heart scan as low as 36%.  She had repeat catheterization  performed in January 2009 after a borderline Myoview scan, and this  showed an ejection fraction of 35-40%, and we subsequently did an  echocardiogram, which showed an ejection fraction of 40-45%.  Her  catheterization showed her stent was widely patent, and there was only  mild nonobstructive disease.   On May 01, 2008, she developed a feeling of shakiness and  palpitations in her chest and some mild shortness of breath in the  emergency room and was found to have a cardiac arrhythmia, which I  believe was PVC.  No kind of treatment was given, and she was referred  to [place] for further evaluation.  Her symptoms lasted for an hour or so  that day, but she has not had any recurrence.  Had no recent chest pain  or shortness of breath.   Her past medical history is significant for hyperlipidemia.  She has a  history of allergy to Plavix with a rash and had been on Ticlid for a  period of time.   CURRENT MEDICATIONS INCLUDE:  1. Coreg 12.5 mg b.i.d.  2. Aspirin 81 mg a day.  3. Lipitor 80 mg.  4. Coumadin one and a half tablet b.i.d.   Her past medical history is also significant for gallbladder surgery in  March 2009.   SOCIAL HISTORY:  She lives in [place] and worked at [employer] in  the past, but not  working recently.  She quit smoking in January.   FAMILY HISTORY:  Positive for heart disease in that her father died of  an MI at 31.   Review of systems is positive for symptoms of fatigue.   PHYSICAL EXAMINATION:  VITAL SIGNS:  Blood pressure is 122/72 and the  pulse 74 and regular.  NECK:  There was no vein distention.  The carotid pulses were full  without bruits.  CHEST:  Clear without rales or rhonchi.  HEART:  Rhythm was regular.  No murmurs or gallops.  ABDOMEN:  Soft with normal bowel sounds.  EXTREMITIES:  Peripheral pulses were full.  There was no peripheral  edema.  MUSCULOSKELETAL:  No deformities.  SKIN:  Warm and dry.  NEUROLOGIC:  No focal neurological signs.   An electrocardiogram showed lateral T-wave changes, changes of old  inferior infarction, and 1 PVC.   IMPRESSION:  1. Recent palpitations and documented premature ventricular  contractions with a recent syncopal episode of uncertain etiology.  2. Coronary artery disease status post diaphragmatic wall infarction      treated with a bare-metal stent to the circumflex artery, June      2008.   SUMMARY:  1. Ejection fraction 45%.  2. Hyperlipidemia.  3. Allergy to PLAVIX.   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  I am uncertain regarding the etiology.  We will plan  to get a BMP and a TSH.  We will get a repeat echocardiogram to re-  evaluate her LV function.  We will increase her Coreg from 12.5 b.i.d.  to one and a half tablets b.i.d.  I will plan to see her back in 2  months.  If she has recurrence of these symptoms, she is to let us know.  We will consider an event monitor.     [name], MD, [facility]  Electronically Signed    BRB/MedQ  DD: 05/04/2008  DT: 05/05/2008  Job #: [id number]

## 2011-01-22 NOTE — Cardiovascular Report (Signed)
NAMEANGLINE, SCHWEIGERT              ACCOUNT NO.:  [id number]   MEDICAL RECORD NO.:  [id number]          PATIENT TYPE:  INP   LOCATION:  2316                         FACILITY:  [facility]   PHYSICIAN:  Bruce R. Juanda Chance, MD, FACCDATE OF BIRTH:  1955-12-29   DATE OF PROCEDURE:  02/28/2007  DATE OF DISCHARGE:                            CARDIAC CATHETERIZATION   CLINICAL HISTORY:  [name] is a 55 year old and has had no prior  history of known heart disease and has not seen a doctor in some time  and is on no medications.  She developed chest pain and was brought to  the emergency room by [facility].  Code STEMI was called en  route.  Her ECG showed inferior ST elevation and anterior ST depression.  She was taken promptly to the cath lab for catheterization and probable  intervention.  She is a smoker.   PROCEDURE:  The procedure was performed via the right femoral artery  using arterial sheath and 6-French preformed coronary catheters.  A  front wall arterial puncture was performed and Omnipaque contrast was  used.  At completion of the diagnostic study,we made a decision to  proceed with intervention on the circumflex lesion.   The patient had been given 3000 units of heparin and four chewable  aspirin in the emergency department.  She was given 600 mg of  clopidogrel in the cath lab and was given bivalirudin bolus infusion.  We used a CLS 3.5 guiding catheter with side holes.  We crossed the  lesion with a Prowater wire without difficulty.  We predilated with a  2.5 x 20 mm Maverick performing one inflation up to 8 atmospheres for 30  seconds.  We then deployed a 3.0 x 20 mm Liberte stent deploying this  with one inflation of 16 atmospheres for 30 seconds.  We then  postdilated with a 3.5 x 15-mm Quantum Maverick performing two  inflations at 16 atmospheres for 30 seconds.   FINAL DIAGNOSIS:  Impromptu guiding catheter.  The patient tolerated the  procedure well.  She had  episodes of accelerated ventricular rhythm and  bradycardia and we placed a venous sheath but did not need a temporary  pacemaker.   RESULTS:  Aortic pressure was 135/82 with mean of 104.  The left  ventricular pressure was 135/26.   The left main coronary:  The left main coronary was free of significant  disease.   Left anterior descending artery:  The left descending gave rise to three  diagonal branch and three septal perforators.  There was 90% stenosis  and a small second diagonal branch.  The rest of the LAD was free of  significant obstruction.   The circumflex artery:  The circumflex artery gave rise to an atrial  branch and very large marginal branch and an AV branch.  The large  marginal branch had several sub-branches in spite of the posterior and  mid inferior wall.  There was a 95% stenosis in the proximal portion of  the marginal branch with TIMI II flow.   The right coronary artery.  The right coronary  artery was a small to  moderate-sized vessel gave rise to a conus branch, right ventricle  branch, a posterior descending branch and three small posterolateral  branches.  There were irregularities and 30% stenosis in the proximal  vessel.   THE LEFT VENTRICULOGRAM:  The left ventriculogram which was performed  after the intervention showed hypokinesis of the mid inferior wall.  The  inferior wall near the base moved well.  The apex moved well.  The  estimated ejection fraction was 6%.   Following stenting of the lesion, the marginal branch of circumflex  artery with stenosis improved from 95% to 0% and the flow improved from  TIMI II to TIMI III flow.   CONCLUSION:  1. Acute inferior-posterior wall myocardial infarction with 95%      stenosis in the large marginal branch of circumflex artery with      TIMI II flow, 90% stenosis in the small second diagonal branch of      the LAD, 30% stenosis in the proximal right coronary artery and mid      inferior wall  hypokinesis with estimated fraction of 60%.  2. Successful PCI of the lesion in the large marginal branch of      circumflex artery using a Liberte bare metal stent with improvement      in narrowing from 95% to 0% and improvement in flow from TIMI II to      TIMI III flow.   DISPOSITION:  The patient returned to post angioplasty unit for further  observation.      [name], MD, [facility]  Electronically Signed     BRB/MEDQ  D:  02/28/2007  T:  02/28/2007  Job:  [id number]   cc:   Cardiopulmonary Clinic

## 2011-01-22 NOTE — H&P (Signed)
NAMEANITTA, TENNY NO.:  [id number]   MEDICAL RECORD NO.:  [id number]          PATIENT TYPE:  INP   LOCATION:  3705                         FACILITY:  [facility]   PHYSICIAN:  Wendi Snipes, MD DATE OF BIRTH:  10-21-1955   DATE OF ADMISSION:  09/25/2008  DATE OF DISCHARGE:                              HISTORY & PHYSICAL   CARDIOLOGIST:  Dr. Juanda Chance.   CHIEF COMPLAINTS:  Chest pain.   HISTORY OF PRESENT ILLNESS:  This is a 55 year old African American  female with coronary disease status post bare mental stent to the circ  in 2008 here with complaints of chest pain.  She states that her  symptoms occurred this evening, lasted only moments and are described as  sharp and stabbing beginning on the right chest wall and radiating to  the left.  Her symptoms did occur at rest and resolved spontaneously.  She is chest pain free this morning.  She had been compliant with her  medications and denies syncope, palpitations, shortness of breath,  paroxysmal nocturnal dyspnea or increased lower extremity edema.   PAST MEDICAL HISTORY:  1. Coronary artery disease status post bare mental stent to the      circumflex in 2008 after a positive stress test.  Her LV ejection      fraction is 35%-40%.  2. Hyperlipidemia.   ALLERGIES:  PLAVIX.   MEDICATIONS:  1. Ramipril 2.5 mg daily.  2. Lipitor 80 mg daily.  3. Coreg 18.75 mg twice daily.  4. Pepcid AC.   SOCIAL HISTORY:  She lives in [place] and she is currently employed.  She does not smoke.   FAMILY HISTORY:  Her father died at 85 of a heart attack.   REVIEW OF SYSTEMS:  All 14 systems were reviewed and were negative  except as mentioned in detail in the HPI.   PHYSICAL EXAM:  Blood pressure is 145/98.  Respiratory 16.  Pulse is 69  and regular.  She is satting 100% on room air.  Generally, she is a 55-  year-old [place] American female appearing stated age.  No acute  distress.  HEENT:  Moist mucous  membranes.  Pupils equal, round, react to light  accommodation.  Anicteric sclera.  NECK:  No jugular venous distention.  No thyromegaly.  CARDIOVASCULAR:  Regular rate and rhythm.  No murmurs, rubs or gallops.  LUNGS:  Clear to auscultation bilaterally.  ABDOMEN:  Nontender, nondistended.  Positive bowel sounds.  No masses.  EXTREMITIES:  No clubbing, cyanosis, edema.  NEURO:  Alert and oriented x3.  Cranial nerves II-XII are grossly  intact.  No focal neurologic deficits.  SKIN:  Warm, dry, and intact.  No rashes.  PSYCH:  Mood and affect are appropriate.   Radiology shows bilateral interstitial markings consistent with  pulmonary edema.  EKG shows normal sinus rhythm and a rate of 66 beats  per minute with an incomplete left bundle branch block and no ST or T-  wave abnormalities suggestive of ischemia.   LABS:  White blood cell count 8.3, hematocrit is 43.  Her platelets 162.  Her  potassium 4.1.  Her creatinine is 0.5.  Initial cardiac biomarkers  are unremarkable.   ASSESSMENT:  This is a 55 year old African American female with history  of coronary disease status post PCI to the circumflex who is here with  atypical chest pain.  1. Chest pain.  This is unlikely cardiac and may be related to      musculoskeletal discomfort.  We will admit her to Dr. Regino Schultze      service to rule out myocardial infarction with set of cardiac      enzymes.  In the meantime we will continue her current medications      for existing coronary artery disease and hyperlipidemia.  She may      likely be discharged in the morning and may not require risk      stratification.  We will check a transthoracic echocardiogram for      her history of left ventricular dysfunction and a possible slight      volume overloaded state.  We will additionally check a BNP.      Wendi Snipes, MD  Electronically Signed     BHH/MEDQ  D:  09/26/2008  T:  09/26/2008  Job:  [id number]

## 2011-01-22 NOTE — Assessment & Plan Note (Signed)
[facility]                            CARDIOLOGY OFFICE NOTE   [name], [name]                     MRN:          [id number]  DATE:06/22/2008                            DOB:          17-Jan-1956    PRIMARY CARE PHYSICIAN:  Dr. Morrie Sheldon at [facility].   CLINICAL HISTORY:  [name] is 55 years old and returns for followup  management of coronary heart disease.  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  She had an ejection fraction in the range of 35%.  She had a recent echo that showed an ejection fraction of 35-40%.  I saw  her in August and she had a syncopal episode and PVCs.  The syncopal  episode occurred some time earlier and we ordered a repeat echo and  laboratory studies and planned the event monitor.  She had not had any  recurrent symptoms.  Since that time, she has done better.  She said she  had some palpitations, but no recurrent syncope.   PAST MEDICAL HISTORY:  Significant for hyperlipidemia.   ALLERGIES:  She also had a history of allergy to PLAVIX.   CURRENT MEDICATIONS:  Lipitor, aspirin, Coreg 12.5 1-1/2 b.i.d., and  over-the-counter medication for reflux.  She had been on lisinopril, but  for some reason this was stopped and she does not know the reason.   PHYSICAL EXAMINATION:  VITAL SIGNS:  Blood pressure is 117/70 and pulse  80 and regular.  NECK:  There is no venous distension.  The carotid pulses were full  without bruits.  CHEST:  Clear.  CARDIAC:  Rhythm was regular.  No murmurs or gallops.  ABDOMEN:  Soft, normal bowel sounds.  There is no hepatosplenomegaly.  EXTREMITIES:  Peripheral pulses are full.  There is no peripheral edema.   An electrocardiogram showed frequent PVCs and lateral T-wave changes.   IMPRESSION:  1. Coronary artery disease status post previous bare-metal stent for      diaphragmatic wall infarction.  2. Left  ventricular dysfunction with ejection fraction of 35-40%.  3. Palpitations and premature ventricular contractions.  4. Hyperlipidemia.   RECOMMENDATIONS:  We will plan to resume the ramipril at 2.5 a day.  We  will get labs including a lipid liver, CBC, BMP in about a week.  I will  see her back in a year.     [name], MD, [facility]  Electronically Signed    BRB/MedQ  DD: 06/22/2008  DT: 06/23/2008  Job #: [id number]

## 2011-01-22 NOTE — Assessment & Plan Note (Signed)
[facility]                            CARDIOLOGY OFFICE NOTE   BRAYLYN, EYE                     MRN:          [id number]  DATE:10/13/2008                            DOB:          [birth year]    PRIMARY CARE PHYSICIAN:  Dr. Morrie Sheldon at [facility], GI Dr. Leone Payor.   CLINICAL HISTORY:  [name] is a 55 year old and returned for a  followup visit after a recent hospitalization.  She had a diaphragmatic  wall infarction in June 2008 treated with a bare metal stent to the  circumflex artery.  She also has a nonischemic cardiomyopathy and  ejection fraction in the range of 35% to 40%.  She was recently  hospitalized with congestive heart failure and possibly related to  dietary indiscretion.  While in the hospital, she was treated for her  heart failure with improvement.  While in the hospital, she had severe  abdominal pain and was found to have a retained common bile duct stone.  She had a previous cholecystectomy.  Dr. Leone Payor removed the stone and  she has been doing fine from this standpoint since that time.  She has  had no recurrent swelling or shortness of breath since discharge from  the hospital.   PAST MEDICAL HISTORY:  Significant for a hyperlipidemia.   ALLERGY:  She has a history of allergy to PLAVIX.   CURRENT MEDICATIONS:  1. Ramipril 2.5 mg daily.  2. Lipitor 80 mg daily.  3. Furosemide 40 mg daily.  4. Carvedilol 12.5 mg one-half b.i.d.  5. Aspirin 81 mg daily.   PHYSICAL EXAMINATION:  VITAL SIGNS:  Blood pressure is 96/68, pulse 72  and regular.  There is no venous distention.  The carotid pulses are  full without bruits.  CHEST:  Clear.  HEART:  Rhythm is regular.  No murmurs or gallops.  ABDOMEN:  Soft.  Normal bowel sounds.  EXTREMITIES:  Peripheral pulses are full with no peripheral edema.   She had a renal profile 2 days ago with a normal BUN and creatinine and  potassium of 3.8.   IMPRESSION:  1. Systolic heart  failure with recent hospitalization, now compensated      and euvolemic.  2. 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  3. Coronary artery disease status post prior diaphragmatic wall      infarction treated with a bare metal stent to circumflex artery in      2008.  4. Hyperlipidemia.  5. Recent retained common bile duct stone, removed with endoscopic      retrograde cholangiopancreatography.   RECOMMENDATIONS:  I think [name] is doing well.  She wants to take  salt substitute and I think it should be fine for her.  We will recheck  her potassium in a week to see if her potassium remains in a normal  range.  This is a little bit on the low side now with Lasix and no  potassium supplementation.  I will plan to see her back in followup in 3  months.     [name], MD, [facility]  Electronically Signed    BRB/MedQ  DD: 10/13/2008  DT: 10/14/2008  Job #: [id number]

## 2011-01-22 NOTE — H&P (Signed)
[name], [name] NO.:  [id number]   MEDICAL RECORD NO.:  [id number]          PATIENT TYPE:  INP   LOCATION:  1823                         FACILITY:  [facility]   PHYSICIAN:  Rod Holler, MD     DATE OF BIRTH:  06/11/1956   DATE OF ADMISSION:  02/28/2007  DATE OF DISCHARGE:                              HISTORY & PHYSICAL   CHIEF COMPLAINT:  Chest pain.   HISTORY OF PRESENT ILLNESS:  [name] is a 55 year old female with a  past medical history significant for tobacco use, who presented to the  emergency department with complaints of chest pain.  The chest pain  started at 0330, awoke her from sleep.  The pain has been constant since  that time, associated with nausea and shortness of breath.  In the  ambulance, the patient was noted to have ST elevation in the inferior  leads and a Code STEMI was activated.  On presentation of the patient to  the emergency department, she continued to have chest pain and shortness  of breath.  In the emergency department, the patient was given heparin  3000 units IV and chewable aspirin.   PAST MEDICAL HISTORY:  None.   MEDICINES:  None.   ALLERGIES:  No known drug allergies.   SOCIAL HISTORY:  The patient smokes 1 pack per day.   FAMILY HISTORY:  No known history of coronary artery disease.   REVIEW OF SYSTEMS:  All systems are reviewed in detail and are negative  except as noted in history of present illness.   PHYSICAL EXAMINATION:  VITAL SIGNS:  Heart rate in the 40s to 50s, blood  pressure in the 80s to 90s systolic.  GENERAL:  Obese female, alert and oriented x3, in discomfort, but in no  respiratory distress.  HEENT:  Atraumatic, normocephalic.  Pupils are equal, round and reactive  to light.  Extraocular movements intact.  NECK:  Supple with no adenopathy, no JVD, no carotid bruits.  CHEST:  Lungs are clear to auscultation bilaterally with equal bilateral  breath sounds.  CORONARY:  Regular, bradycardic;  no murmurs, rubs, or gallops; 1+ right  femoral pulse without bruits.  ABDOMEN:  Soft, nontender and non-distended with active bowel sounds.  EXTREMITIES:  No clubbing, cyanosis, or edema.  NEUROLOGIC:  No focal deficits.   EKG:  Sinus bradycardia with inferolateral ST elevation.   LABORATORY DATA:  Pending.   IMPRESSION:  Inferior ST-elevation myocardial infarction.   PLAN:  1. To the cath lab emergently for cardiac catheterization.  2. Admit the patient to the CCU, aspirin daily, most likely will      undergo percutaneous intervention and we will start her on Plavix      daily, Lipitor daily, beta blocker, ACE inhibitor.  We will get a      lipid panel.  Serial cardiac enzymes.  3. Tobacco cessation consult.  4. A.m. labs to include a comprehensive metabolic panel, a magnesium      level and CBC.      Rod Holler, MD  Electronically Signed     TRK/MEDQ  D:  02/28/2007  T:  02/28/2007  Job:  [id number]

## 2011-01-22 NOTE — Cardiovascular Report (Signed)
NAMECLARKE, AMBURN              ACCOUNT NO.:  [id number]   MEDICAL RECORD NO.:  [id number]          PATIENT TYPE:  OIB   LOCATION:  2899                         FACILITY:  [facility]   PHYSICIAN:  Bruce R. Juanda Chance, MD, FACCDATE OF BIRTH:  12-14-1955   DATE OF PROCEDURE:  09/30/2007  DATE OF DISCHARGE:                            CARDIAC CATHETERIZATION   CLINICAL HISTORY:  Mrs. Neuroth is 55 year old and in June of 2008 had a  posterior wall myocardial infarction treated with a bare metal stent to  the circumflex artery.  She had Plavix allergy and was treated with  Ticlid for a month.  I saw her recently for a preoperative evaluation  prior to gallbladder surgery by Dr. Michaell Cowing.  We did a Myoview scan which  showed inferior scar with suggestion of peri scar ischemia and ejection  fraction of 36%.  Ejection fraction at cath with estimated at 60%.  For  this reason we brought her in for angiography.   PROCEDURE:  The procedure was performed via the right femoral artery  using an arterial sheath and 6-French preformed coronary catheters.  A  front wall arterial puncture was performed and Omnipaque contrast was  used.  The patient tolerated the procedure well and left the laboratory  in satisfactory condition.  The right femoral artery was closed with  Angio-Seal at the end of the procedure.   RESULTS:  Left main coronary artery:  The left main coronary artery was  free of significant disease.  Left anterior descending artery:  The left anterior descending artery  gave rise to two diagonal branches and two septal perforators.  There  was 50% narrowing in a subbranch of the first diagonal branch.  The  other vessels were free of significant obstruction.  Circumflex artery:  The circumflex artery gave rise to two atrial  branches, two small marginal branches and a large and small  posterolateral branch.  There was 30% narrowing within the stent in the  proximal to mid vessel.  Right coronary  artery:  The right coronary artery was a moderate-sized  vessel that gave rise to a conus branch, right ventricular branch,  posterior descending branch and two posterolateral branches.  There was  30% narrowing in the proximal portion of the right coronary artery.  Left ventriculogram:  The left ventriculogram performed in the RAO  projection showed global hypokinesis with an estimated ejection fraction  of 35-40%.  Left ventriculogram:  Left ventriculogram performed in the LAO  projection showed akinesis of the posterior wall.  The septum and tip of  the apex moved well.   HEMODYNAMIC DATA:  Aortic pressure was 163/86 with a mean of 117.  The  left ventricular pressure was 163/22.   CONCLUSION:  1. Coronary artery disease status post prior posterior wall myocardial      infarction treated with stenting of the circumflex artery with 30%      narrowing at the stent site in the circumflex artery, 50% narrowing      in a subbranch of a diagonal branch of the LAD, 30% narrowing in      the  proximal right coronary artery.  2. Left ventricular dysfunction with posterior wall akinesis and      global hypokinesis and estimated ejection fraction of 35-40%.   RECOMMENDATIONS:  The stent is widely patent.  The reason for the  reduced LV function is not clear.  Will plan to treat the patient with  an ACE inhibitor and beta-blocker.  I think she will be okay to proceed  with the surgery.  Will also reevaluate her LV function with an  echocardiogram.      Bruce R. Juanda Chance, MD, [facility]  Electronically Signed     BRB/MEDQ  D:  09/30/2007  T:  09/30/2007  Job:  [id number]   cc:   Everardo Beals. Juanda Chance, MD, [facility]  Ardeth Sportsman, MD  [facility]

## 2011-01-22 NOTE — Discharge Summary (Signed)
[name], [name]              ACCOUNT NO.:  [id number]   MEDICAL RECORD NO.:  [id number]          PATIENT TYPE:  INP   LOCATION:  2004                         FACILITY:  [facility]   PHYSICIAN:  Everardo Beals. Juanda Chance, MD, FACCDATE OF BIRTH:  20-Oct-1955   DATE OF ADMISSION:  02/28/2007  DATE OF DISCHARGE:  03/04/2007                               DISCHARGE SUMMARY   PRIMARY CARDIOLOGIST:  Everardo Beals. Juanda Chance, MD, [facility]   PRIMARY CARE PHYSICIAN:  [facility].   DISCHARGE DIAGNOSIS:  Acute inferoposterior ST-segment-elevation  myocardial infarction.   SECONDARY DIAGNOSES:  1. Coronary artery disease.  2. Hyperlipidemia.  3. Ongoing tobacco abuse.  4. Asymptomatic nocturnal bradycardia.   ALLERGIES:  No known drug allergies.   PROCEDURES:  Left heart cardiac catheterization with successful PCI and  stenting of the proximal left circumflex with placement of a 3.0 x 20 mm  Liberte bare metal stent.   HISTORY OF PRESENT ILLNESS:  [name] with  no significant prior medical history with the exception of tobacco  abuse, who was in her usual state of health until 3:30 a.m. on February 28, 2007, when she awoke from sleep with constant chest pain associated with  nausea and shortness of breath.  She activated EMS and in the ambulance  was noted to have ST-segment elevation in inferior leads, and a Code  STEMI was activated.  She was taken emergently to the [facility]  catheterization lab for further evaluation.   HOSPITAL COURSE:  Emergent cardiac catheterization was performed by Dr.  Juanda Chance revealing a 95% stenosis in the first obtuse marginal branch of  the left circumflex which was successfully treated with a 3.0 x 20 mm  Liberte bare metal stent. She also had a 90% stenosis in a small second  diagonal branch of the LAD, and this was medically managed.  EF was 60%  with posterior hypokinesis.  Postprocedure, the patient was monitored in  the coronary intensive care  unit and peaked her CK at 1665, MB at 304.3,  and troponin I at 42.96.  She was initiated on a low-dose beta blocker  and ACE inhibitor therapy and was noted to have a brief episode of  nonsustained ventricular tachycardia which was asymptomatic, occurring  in the early morning hours of June 23.  In the early morning hours of  June 24, she had a 3-second asymptomatic pause, again while she was  sleeping.  We have maintained her beta blocker dosage as during the day.  Her heart rates have been consistently in the 50-70s, and she has had  good heart rate response with activity.  She has not had any recurrent  discomfort, and has been counseled on the importance of smoking  cessation as well as medication and dietary compliance. She will be  discharged home today in satisfactory condition.   DISCHARGE LABORATORY DATA:  Hemoglobin 12.8, hematocrit 38.0, WBC 7.4,  platelets 212.  Sodium 140, potassium 3.9, chloride 107, CO2 26, BUN 6,  creatinine 0.59, glucose 101.  PT 13.9, INR 1.1.  CK 422, MB 22.3,  troponin 18.42.  Total cholesterol  107, triglycerides 99, HDL 23, LDL  64.  Total bilirubin 0.7, alkaline phosphatase 56, AST 124, ALT 28,  albumin 2.9, calcium 9.4, magnesium 1.8.  TSH 3.519.  Hemoglobin A1c  5.9.  BNP less than 30.   DISPOSITION:  The patient is being discharged home today in good  condition.   FOLLOWUP PLANS AND APPOINTMENTS:  The patient has followup with  [facility] tomorrow, March 05, 2007, at 12 p.m.  She has followup with  Dr. Regino Schultze PA on July 14 at 3 p.m.   DISCHARGE MEDICATIONS:  1. Aspirin 325 mg daily.  2. Plavix 75 mg daily.  3. Lopressor 12.5 mg b.i.d.  4. Lisinopril 25 mg daily.  5. Lipitor 80 mg nightly.  6. Nitroglycerin 0.4 mg sublingually p.r.n. chest pain.   OUTSTANDING LABORATORY STUDIES:  None.   DURATION OF DISCHARGE ENCOUNTER:  60 minutes including physician time.      Nicolasa Ducking, ANP      Bruce R. Juanda Chance, MD, [facility]   Electronically Signed    CB/MEDQ  D:  03/04/2007  T:  03/04/2007  Job:  [id number]   cc:   Dala Dock

## 2011-01-22 NOTE — Discharge Summary (Signed)
NAMEKEMBER, BOCH              ACCOUNT NO.:  [id number]   MEDICAL RECORD NO.:  [id number]          PATIENT TYPE:  INP   LOCATION:  3705                         FACILITY:  [facility]   PHYSICIAN:  Everardo Beals. Juanda Chance, MD, FACCDATE OF BIRTH:  1956-05-31   DATE OF ADMISSION:  09/25/2008  DATE OF DISCHARGE:  09/30/2008                               DISCHARGE SUMMARY   PROCEDURES:  1. CT of the abdomen and pelvis with contrast.  2. Endoscopic retrograde cholangiopancreatography.  3. Two-D echocardiogram.   PRIMARY FINAL DISCHARGE DIAGNOSIS:  Chest pain felt secondary to  gastrointestinal problems.   SECONDARY DIAGNOSES:  1. Retained stone, removed at endoscopic retrograde      cholangiopancreatography.  2. Status post bare-metal stent to the circumflex in 2008 secondary to      inferior ST elevation myocardial infarction, residual disease of      90% and a small second diagonal branch treated medically.  3. Nonischemic cardiomyopathy with an ejection fraction of 25-35% by      echocardiogram January 2010.  4. Ongoing tobacco use.  5. Hyperlipidemia.  6. Family history of coronary artery disease in her father.  7. Allergy or intolerance to PLAVIX.  8. Status post cholecystectomy in 2009.   TIME OF DISCHARGE:  37 minutes.   HOSPITAL COURSE:  Ms. Mccombs is a 55 year old female with known  coronary artery disease.  She came to the hospital on the day of  admission with chest pain and was admitted for further evaluation and  treatment.   Her cardiac enzymes were negative for MI.  Her BNP was minimally  elevated at 217 and she was started on Lasix.  A urinalysis was  negative.  Initial liver function tests were negative but they were  rechecked September 27, 2008 and her alkaline phosphatase was 127 with an  SGOT of 1073 and an SGPT of 489.  GTT was 522.  Total bilirubin was 1.3  with a direct bilirubin of 0.5.  Amylase and lipase were within normal  limits.  A GI consult was called.   She had a CT of the abdomen and pelvis showing predominantly  extrahepatic biliary ductal dilatation in the setting of prior  cholecystectomy.  She also had multicystic lung parenchymal changes that  were partly visualized and suggestive of chronic pulmonary eosinophilic  granuloma.  There was significant pelvic and proximal lower extremity  atherosclerosis seen including occlusion or near occlusion of the left  superficial femoral artery.   Because of the CT results, an ERCP was performed.   There was an irregular 8-mm stone in the common hepatic duct which was  removed.  The endoscopic survey was otherwise normal but the esophagus  was not well seen.  After the procedure, Ms. Evetts was placed on clear  liquids and advanced the next day to a heart-healthy diet.   Initially, her Lipitor for which Crestor was substituted was placed on  hold because of her elevated liver function testing.  However,  Dr.  Leone Payor felt that because her liver function tests were improving, this  could be restarted and followed as an  outpatient.  Eventually, she needs  a screening colonoscopy, but this would not have to be done at this  time.  Ms. Peeters was evaluated by Dr. Juanda Chance.  She was ambulating  without chest pain or shortness of breath and considered stable for  discharge with close outpatient followup.   DISCHARGE INSTRUCTIONS:  1. Her activity level is to be increased gradually.  2. She is to stick to a low-sodium, heart-healthy diet and use light      salt.  3. She is to follow up with Dr. Juanda Chance on October 13, 2008 at 4 p.m.  4. She is to get lab work done on October 10, 2008 which should      include a BMET.  5. She is to return to [facility] at [facility] on October 12, 2008      at 8:45 and with Dr. Leone Payor as needed.   DISCHARGE MEDICATIONS:  1. Ramipril 2.5 mg daily  2. Lipitor 80 mg a day.  3. Coreg 12.5 mg b.i.d., dose decreased secondary to heart rates in      the 40s  overnight.  4. Protonix 40 or Pepcid AC daily as needed.  5. Lasix 40 mg a day.      Theodore Demark, PA-C      Bruce R. Juanda Chance, MD, [facility]  Electronically Signed    RB/MEDQ  D:  09/30/2008  T:  10/01/2008  Job:  [id number]   cc:   Iva Boop, MD,FACG

## 2011-01-22 NOTE — Consult Note (Signed)
[name], [name]              ACCOUNT NO.:  [id number]   MEDICAL RECORD NO.:  [id number]          PATIENT TYPE:  EMS   LOCATION:  MAJO                         FACILITY:  [facility]   PHYSICIAN:  Thomas C. Wall, MD, FACCDATE OF BIRTH:  07-Jul-1956   DATE OF CONSULTATION:  10/11/2007  DATE OF DISCHARGE:                                 CONSULTATION   REFERRING PHYSICIAN:  Dr. Carleene Cooper.   REASON FOR CONSULTATION:  We were asked by Dr. Carleene Cooper in the  emergency department to evaluate [name] with chest discomfort.   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  She had a repeat catheterization, September 30, 2007,  which showed nonobstructive disease.   Last night, she had a feeling of indigestion which relieved with  antacid.  This morning, she noticed about 6 minutes of some tingling her  left arm and leg with no other neurological complaints.  She then had  some midsternal hard pain and epigastric pain associated with some  nausea.  She had just drunk some orange juice.  She had not eaten  breakfast.   This is not similar to her previous angina.  It is worse with  swallowing, particularly the orange juice.   PAST MEDICAL HISTORY:  She has apparently gallstones and is scheduled  for elective cholecystectomy.  She is unaware of the surgeon's name or  the date.  This is in the previous chart.  Dr. Juanda Chance had cleared her  for a cholecystectomy.   Her cath showed essentially a 50% diagonal #1 branch, 30% in-stent  restenosis of the OM-1, 30% of the proximal right coronary artery, EF  35% to 40% with posterior akinesia.   She has ongoing tobacco abuse, history of asymptomatic nocturnal  bradycardia.   She had a Myoview, January 2009, prior to her cath that showed an  inferior scar and peri-infarct ischemia, EF 36%.   MEDICATIONS:  1. Lisinopril  2.5 mg a day.  2. Lipitor 80 mg a day.  3. Aspirin 325 mg.  4. Metoprolol 25 mg p.o. b.i.d.  5. Pepcid and nitroglycerin p.r.n.   ALLERGIES:  She is intolerant of PLAVIX.   SOCIAL HISTORY:  She lives in [place].  She has been working a [employer], but apparently is not working now.   She drinks 1 can of beer a day.  She smokes; she says she quit January  21.   FAMILY HISTORY:  Pertinent for father who died of MI at 28.   REVIEW OF SYSTEMS:  Other than the HPI is negative.   PHYSICAL EXAMINATION:  VITAL SIGNS:  Her blood pressure is 140/91, pulse  74 and she is in sinus rhythm, respiratory rate is 18, temperature is  97.1 and O2 SAT is 99% on 2 L.  GENERAL APPEARANCE:  She is in no acute distress.  Skin is warm and dry.  She is alert and oriented.  She is very pleasant.  HEENT:  Normocephalic,  atraumatic.  PERRLA.  Extraocular is intact.  Sclerae are  clear.  Facial symmetry is normal.  NECK:  Supple without JVD.  Carotid upstrokes are equal bilaterally  without bruits.  There is no obvious lymphadenopathy.  CARDIAC:  Regular  rate and rhythm without gallop.  LUNGS:  Clear to auscultation.  SKIN:  Shows no rashes or lesions.  ABDOMEN:  She is mildly tender in the epigastric area as well as the  right upper quadrant.  There is no obvious Murphy's sign.  EXTREMITIES:  No cyanosis, clubbing or edema.  Pulses were present.  MUSCULOSKELETAL:  No joint deformity or effusions.  NEUROLOGIC:  Exam is grossly intact.   EKG:  Shows sinus rhythm at a rate of 67, T-wave inversion in III and  aVF and V5 and V6.  There have been no acute changes or changes in her  EKG.   Her white count is only 6.8.  The rest her labs are okay.  The first set  of markers are negative.   ASSESSMENT:  Atypical chest pain.  This occurred after she has some  orange juice and was relieved last night with antacids.  She has some  mild epigastric discomfort and has known cholelithiasis and is due for   elective cholecystectomy.  I suspect this is the etiology of her  discomfort.  She has no findings of an acute cholecystitis and her white  count is normal.   Other problems listed above.   PLAN:  1. Discharge from the emergency room.  2. Continue Pepcid and her other medications.  3. I informed the patient to follow up this appointments st at      [facility] as soon as possible to have her gallbladder      removed.  I think a laparoscopic cholecystectomy is planned.      Thomas C. Daleen Squibb, MD, [facility]  Electronically Signed     TCW/MEDQ  D:  10/11/2007  T:  10/12/2007  Job:  [id number]   cc:   [employer]  Bruce R. Juanda Chance, MD, [facility]  [facility]

## 2011-05-30 LAB — PROTIME-INR
INR: 0.9
Prothrombin Time: 12.3

## 2011-05-30 LAB — APTT: aPTT: 26

## 2011-05-31 LAB — COMPREHENSIVE METABOLIC PANEL
Albumin: 3.7
BUN: 11
Creatinine, Ser: 0.56
Potassium: 4.3
Total Protein: 7.1

## 2011-05-31 LAB — POCT CARDIAC MARKERS
CKMB, poc: 1.1
CKMB, poc: 2.2
Myoglobin, poc: 28.8
Myoglobin, poc: 46
Operator id: 284251
Operator id: 285491
Troponin i, poc: <0.05
Troponin i, poc: <0.05

## 2011-05-31 LAB — I-STAT 8, (EC8 V) (CONVERTED LAB)
Acid-Base Excess: 2
BUN: 13
Bicarbonate: 26.3 — ABNORMAL HIGH
Chloride: 109
Glucose, Bld: 93
HCT: 49 — ABNORMAL HIGH
Hemoglobin: 16.7 — ABNORMAL HIGH
Operator id: 285491
Potassium: 4.5
Sodium: 139
TCO2: 28
pCO2, Ven: 40.4 — ABNORMAL LOW
pH, Ven: 7.422 — ABNORMAL HIGH

## 2011-05-31 LAB — DIFFERENTIAL
Basophils Absolute: 0
Eosinophils Relative: 6 — ABNORMAL HIGH
Lymphocytes Relative: 59 — ABNORMAL HIGH
Lymphocytes Relative: 44
Monocytes Absolute: 0.6
Monocytes Relative: 6
Neutro Abs: 5.1
Neutrophils Relative %: 26 — ABNORMAL LOW

## 2011-05-31 LAB — CBC
HCT: 44.3
HCT: 39.8
MCV: 89.3
Platelets: 199
Platelets: 215
RDW: 13.1
RDW: 13.7

## 2011-05-31 LAB — POCT I-STAT CREATININE
Creatinine, Ser: 0.8
Operator id: 285491

## 2011-06-03 LAB — BASIC METABOLIC PANEL
CO2: 26
GFR calc non Af Amer: >60
Glucose, Bld: 99
Potassium: 4.3
Sodium: 141

## 2011-06-03 LAB — CBC
HCT: 41.7
Hemoglobin: 14
MCHC: 33.6
RDW: 13.6

## 2011-06-10 LAB — CBC
HCT: 46.6 — ABNORMAL HIGH
Hemoglobin: 15.3 — ABNORMAL HIGH
MCV: 91.1
Platelets: 209
RBC: 5.12 — ABNORMAL HIGH
WBC: 6.9

## 2011-06-10 LAB — BASIC METABOLIC PANEL
BUN: 5 — ABNORMAL LOW
Chloride: 106
Potassium: 4.4
Sodium: 139

## 2011-06-10 LAB — DIFFERENTIAL
Eosinophils Absolute: 0.4
Eosinophils Relative: 5
Lymphs Abs: 3.5
Monocytes Absolute: 0.5
Monocytes Relative: 7

## 2011-06-10 LAB — URINALYSIS, ROUTINE W REFLEX MICROSCOPIC
Bilirubin Urine: NEGATIVE
Hgb urine dipstick: NEGATIVE
Specific Gravity, Urine: 1.011
Urobilinogen, UA: 1

## 2011-06-10 LAB — GLUCOSE, CAPILLARY

## 2011-06-19 LAB — CBC
HCT: 44.5
Hemoglobin: 14.9
MCHC: 33.6
MCV: 87.2
Platelets: 226
RDW: 14.2 — ABNORMAL HIGH

## 2011-06-19 LAB — DIFFERENTIAL
Basophils Absolute: 0.1
Basophils Relative: 2 — ABNORMAL HIGH
Lymphocytes Relative: 51 — ABNORMAL HIGH
Monocytes Absolute: 0.4
Monocytes Relative: 4
Neutro Abs: 3.8
Neutrophils Relative %: 39 — ABNORMAL LOW

## 2011-06-19 LAB — COMPREHENSIVE METABOLIC PANEL
Albumin: 3.9
BUN: 11
Creatinine, Ser: 0.61
Glucose, Bld: 99
Total Protein: 8

## 2011-06-19 LAB — URINALYSIS, ROUTINE W REFLEX MICROSCOPIC
Hgb urine dipstick: NEGATIVE
Protein, ur: NEGATIVE
Specific Gravity, Urine: 1.025
Urobilinogen, UA: 0.2

## 2011-06-19 LAB — POCT CARDIAC MARKERS
Myoglobin, poc: 54.1
Operator id: 257131

## 2011-06-26 LAB — DIFFERENTIAL
Basophils Absolute: 0.1
Basophils Relative: 1
Eosinophils Relative: 3
Lymphocytes Relative: 46
Monocytes Absolute: 0.8 — ABNORMAL HIGH
Monocytes Relative: 6

## 2011-06-26 LAB — COMPREHENSIVE METABOLIC PANEL
ALT: 28
AST: 124 — ABNORMAL HIGH
AST: 18
Albumin: 2.9 — ABNORMAL LOW
Alkaline Phosphatase: 54
Calcium: 8.8
Chloride: 112
Creatinine, Ser: 0.51
GFR calc Af Amer: >60
GFR calc Af Amer: >60
GFR calc non Af Amer: >60
Potassium: 2.7 — CL
Sodium: 140
Total Bilirubin: 0.6
Total Protein: 6.1
Total Protein: 6.2

## 2011-06-26 LAB — BASIC METABOLIC PANEL
BUN: 4 — ABNORMAL LOW
CO2: 26
CO2: 26
Calcium: 9.2
Calcium: 9.4
Chloride: 109
Creatinine, Ser: 0.52
Creatinine, Ser: 0.59
GFR calc Af Amer: >60
GFR calc Af Amer: >60
GFR calc non Af Amer: >60
GFR calc non Af Amer: >60
Glucose, Bld: 101 — ABNORMAL HIGH
Glucose, Bld: 128 — ABNORMAL HIGH
Potassium: 3.7
Sodium: 135

## 2011-06-26 LAB — LIPID PANEL
Cholesterol: 107
HDL: 23 — ABNORMAL LOW
HDL: 23 — ABNORMAL LOW
LDL Cholesterol: 62
Total CHOL/HDL Ratio: 4.7
Triglycerides: 126
Triglycerides: 99
VLDL: 25

## 2011-06-26 LAB — CARDIAC PANEL(CRET KIN+CKTOT+MB+TROPI)
CK, MB: 262.4 — ABNORMAL HIGH
CK, MB: 304.3 — ABNORMAL HIGH
Relative Index: 18.3 — ABNORMAL HIGH
Total CK: 1641 — ABNORMAL HIGH
Total CK: 1665 — ABNORMAL HIGH
Troponin I: 42.96

## 2011-06-26 LAB — CK TOTAL AND CKMB (NOT AT ARMC)
CK, MB: 1.9
CK, MB: 22.3 — ABNORMAL HIGH
Total CK: 422 — ABNORMAL HIGH

## 2011-06-26 LAB — TROPONIN I
Troponin I: 0.03
Troponin I: 18.42

## 2011-06-26 LAB — CBC
HCT: 38.1
HCT: 38.3
Hemoglobin: 12.7
Hemoglobin: 12.8
MCHC: 33.5
MCV: 87
Platelets: 212
RBC: 4.34
RBC: 4.42
RDW: 13.2
RDW: 13.2
WBC: 12.2 — ABNORMAL HIGH
WBC: 9.4

## 2011-06-27 ENCOUNTER — Other Ambulatory Visit: Payer: Self-pay | Admitting: *Deleted

## 2011-06-27 IMAGING — CR DG CHEST 1V PORT
1 series · 1 of 1 positions shown · non-contrast
Comparison: Portable chest x-ray 05/01/2009 and two-view chest x-
ray 01/21/2009.

CLINICAL DATA: Left-sided chest pain.

PORTABLE CHEST - 1 VIEW [DATE]/7919 1619 hours:

[AP]
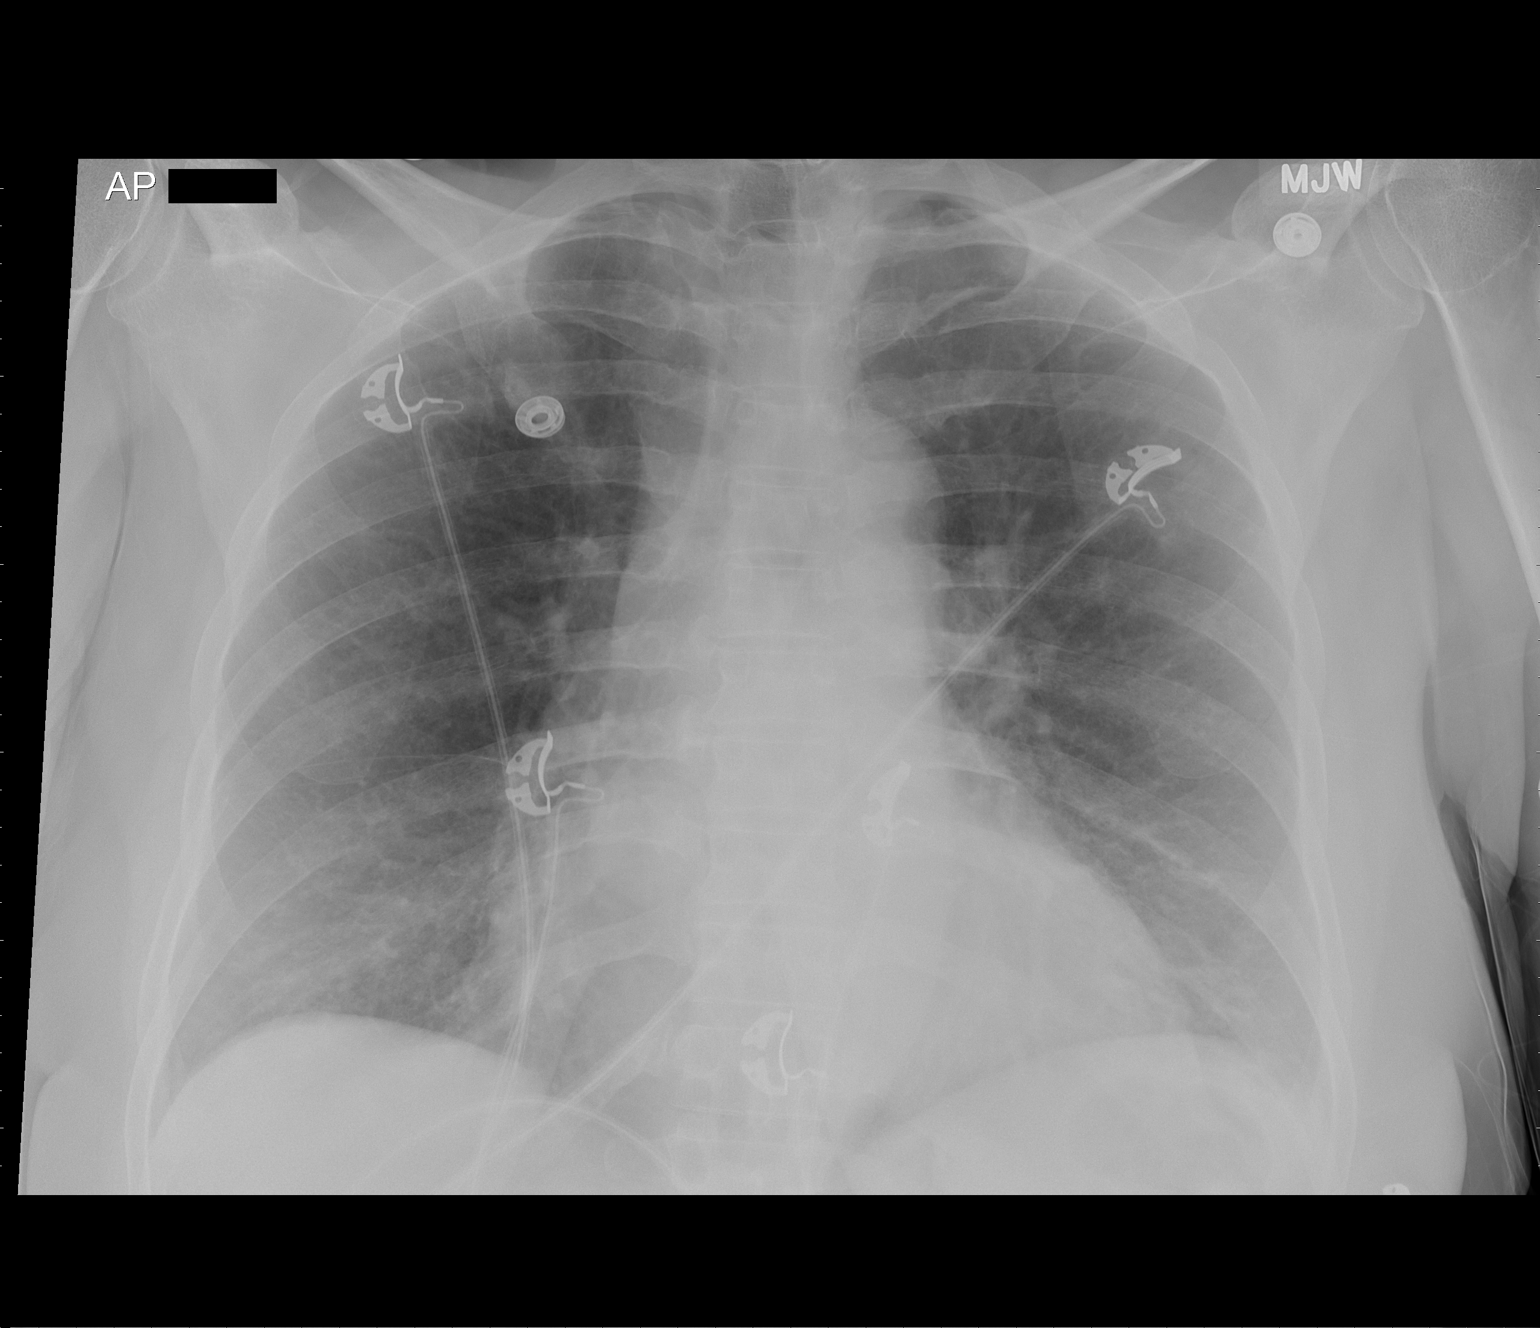

[1 of 1 positions shown; findings below may reference images not displayed]

FINDINGS: Heart mildly enlarged but stable.  Mild pulmonary venous
hypertension without overt pulmonary edema, unchanged.  Lingular
scarring, unchanged.  No new pulmonary parenchymal abnormalities.
No pleural effusions.
IMPRESSION: Stable mild cardiomegaly.  Lingular scarring.  No acute
cardiopulmonary disease.

## 2011-06-27 MED ORDER — CARVEDILOL 25 MG PO TABS: 25.0000 mg | ORAL_TABLET | Freq: Two times a day (BID) | ORAL | Status: DC

## 2012-08-10 ENCOUNTER — Encounter: Payer: Self-pay | Admitting: Cardiovascular Disease

## 2012-08-10 ENCOUNTER — Encounter: Payer: Self-pay | Admitting: *Deleted

## 2012-08-11 ENCOUNTER — Other Ambulatory Visit: Payer: Self-pay | Admitting: *Deleted

## 2012-08-11 ENCOUNTER — Ambulatory Visit (INDEPENDENT_AMBULATORY_CARE_PROVIDER_SITE_OTHER): Payer: Self-pay | Admitting: *Deleted

## 2012-08-11 ENCOUNTER — Ambulatory Visit (INDEPENDENT_AMBULATORY_CARE_PROVIDER_SITE_OTHER): Payer: Self-pay | Admitting: Cardiovascular Disease

## 2012-08-11 ENCOUNTER — Encounter: Payer: Self-pay | Admitting: Cardiovascular Disease

## 2012-08-11 VITALS — BP 144/70 | HR 84 | Resp 16 | Ht 69.0 in | Wt 162.0 lb

## 2012-08-11 DIAGNOSIS — I2510 Atherosclerotic heart disease of native coronary artery without angina pectoris: Secondary | ICD-10-CM

## 2012-08-11 DIAGNOSIS — I25810 Atherosclerosis of coronary artery bypass graft(s) without angina pectoris: Secondary | ICD-10-CM

## 2012-08-11 DIAGNOSIS — I119 Hypertensive heart disease without heart failure: Secondary | ICD-10-CM

## 2012-08-11 DIAGNOSIS — I255 Ischemic cardiomyopathy: Secondary | ICD-10-CM

## 2012-08-11 DIAGNOSIS — I739 Peripheral vascular disease, unspecified: Secondary | ICD-10-CM | POA: Insufficient documentation

## 2012-08-11 DIAGNOSIS — E785 Hyperlipidemia, unspecified: Secondary | ICD-10-CM

## 2012-08-11 DIAGNOSIS — Z0000 Encounter for general adult medical examination without abnormal findings: Secondary | ICD-10-CM

## 2012-08-11 DIAGNOSIS — I10 Essential (primary) hypertension: Secondary | ICD-10-CM

## 2012-08-11 DIAGNOSIS — I2589 Other forms of chronic ischemic heart disease: Secondary | ICD-10-CM

## 2012-08-11 DIAGNOSIS — Z79899 Other long term (current) drug therapy: Secondary | ICD-10-CM

## 2012-08-11 DIAGNOSIS — F17200 Nicotine dependence, unspecified, uncomplicated: Secondary | ICD-10-CM

## 2012-08-11 LAB — BASIC METABOLIC PANEL
GFR: 121.78 mL/min (ref >60.00)
Potassium: 5.1 mEq/L (ref 3.5–5.1)
Sodium: 137 mEq/L (ref 135–145)

## 2012-08-11 MED ORDER — CARVEDILOL 12.5 MG PO TABS: 12.5000 mg | ORAL_TABLET | Freq: Two times a day (BID) | ORAL | Status: DC

## 2012-08-11 MED ORDER — LOSARTAN POTASSIUM 50 MG PO TABS: 50.0000 mg | ORAL_TABLET | Freq: Every day | ORAL | Status: DC

## 2012-08-11 NOTE — Patient Instructions (Signed)
Your physician recommends that you schedule a follow-up appointment in:  3 MONTHS  WITH DR [facility] Your physician has recommended you make the following change in your medication: RESTART CARVEDILOL  12.5 MG TWICE DAILY  LOSARTAN  50 MG 1 EVERY DAY A chest x-ray takes a picture of the organs and structures inside the chest, including the heart, lungs, and blood vessels. This test can show several things, including, whether the heart is enlarges; whether fluid is building up in the lungs; and whether pacemaker / defibrillator leads are still in place. DX SMOKER Your physician has requested that you have a lower extremity arterial exercise duplex. During this test, exercise and ultrasound are used to evaluate arterial blood flow in the legs. Allow one hour for this exam. There are no restrictions or special instructions. WITH ABIS DX CLAUDICATION Your physician recommends that you return for lab work in: TODAY BMET DX V58.69 Your physician has requested that you have a cardiac MRI. Cardiac MRI uses a computer to create images of your heart as its beating, producing both still and moving pictures of your heart and major blood vessels. For further information please visit [email or web address]. Please follow the instruction sheet given to you today for more information. ASSESS EF  CARDIOMYOPATHY You have been referred to [facility]  FOR GENERAL CARE

## 2012-08-11 NOTE — Assessment & Plan Note (Signed)
Poor F/U with no meds being taken  MRI to assess EF and scar burden ? Need for AICD.  Large circ/IMI on ECG andn history of EF 30-35% Start coreg and cozaar Further recommendations pending studies

## 2012-08-11 NOTE — Progress Notes (Signed)
Patient ID: [name], female   DOB: 08-14-1956, 56 y.o.   MRN: [id number] Previous patient of Dr Juanda Chance Referred by primary as she has not had f/u since he retired  History of BMS to circumflex in 2008.  EF has been as low as 30-35% but not assessed in the last 3 years.  Reviewed cath and she had small diagonal disease as well.  Previous LE duplex suggested left SFA occlusion.  Plavix apparently causes a rash.  She is still smoking counseled for less than 10 minutes.  Failed Chantix in past.  About 1ppd.  Does not have primary doctor.  She has mild exertional dyspnea.  No chest pain.  Occational flip flops and palpitations.  Compliant with meds.  No syncope  Gets right hip and buttock pain with walking that is likely claudications Not taking meds ran out.    ROS: Denies fever, malais, weight loss, blurry vision, decreased visual acuity, cough, sputum, SOB, hemoptysis, pleuritic pain, palpitaitons, heartburn, abdominal pain, melena, lower extremity edema, claudication, or rash.  All other systems reviewed and negative   General: Affect appropriate Healthy:  appears stated age HEENT: normal Neck supple with no adenopathy JVP normal no bruits no thyromegaly Lungs clear with no wheezing and good diaphragmatic motion Heart:  S1/S2 no murmur,rub, gallop or click PMI normal Abdomen: benighn, BS positve, no tenderness, no AAA no bruit.  No HSM or HJR Distal pulses diminshed below femorals with no bruits No edema Neuro non-focal Skin warm and dry No muscular weakness  Medications Current Outpatient Prescriptions  Medication Sig Dispense Refill  . carvedilol (COREG) 25 MG tablet Take 1 tablet (25 mg total) by mouth 2 (two) times daily.  30 tablet  0    Allergies Clopidogrel bisulfate  Family History: No family history on file.  Social History: History   Social History  . Marital Status: Single    Spouse Name: N/A    Number of Children: N/A  . Years of Education: N/A    Occupational History  . Not on file.   Social History Main Topics  . Smoking status: Not on file  . Smokeless tobacco: Not on file  . Alcohol Use: Not on file  . Drug Use: Not on file  . Sexually Active: Not on file   Other Topics Concern  . Not on file   Social History Narrative  . No narrative on file    Electrocardiogram:  SR rate 86 LAD old IMI  Assessment and Plan

## 2012-08-11 NOTE — Assessment & Plan Note (Signed)
CXR willing to try patches  Relations to PVD and CAD discussed

## 2012-08-11 NOTE — Assessment & Plan Note (Signed)
No chest pain No indication for stress test  Get back on Rx with ASA and beta blocker assess EF

## 2012-08-11 NOTE — Assessment & Plan Note (Signed)
?   Inflow disease on right with hip / buttock pain  Arterial duplex and ABI's

## 2012-08-11 NOTE — Assessment & Plan Note (Signed)
Low sodium diet start ARB and beta blocker

## 2012-08-12 ENCOUNTER — Ambulatory Visit (INDEPENDENT_AMBULATORY_CARE_PROVIDER_SITE_OTHER)
Admission: RE | Admit: 2012-08-12 | Discharge: 2012-08-12 | Disposition: A | Discharge status: Home / Self Care | Payer: Self-pay | Source: Ambulatory Visit | Attending: Cardiovascular Disease | Admitting: Cardiovascular Disease

## 2012-08-12 DIAGNOSIS — F17200 Nicotine dependence, unspecified, uncomplicated: Secondary | ICD-10-CM

## 2012-08-13 ENCOUNTER — Other Ambulatory Visit: Payer: Self-pay | Admitting: *Deleted

## 2012-08-13 ENCOUNTER — Encounter: Payer: Self-pay | Admitting: Cardiovascular Disease

## 2012-08-13 DIAGNOSIS — Z79899 Other long term (current) drug therapy: Secondary | ICD-10-CM

## 2012-08-14 ENCOUNTER — Other Ambulatory Visit (INDEPENDENT_AMBULATORY_CARE_PROVIDER_SITE_OTHER): Payer: Self-pay

## 2012-08-14 DIAGNOSIS — Z79899 Other long term (current) drug therapy: Secondary | ICD-10-CM

## 2012-08-14 LAB — BASIC METABOLIC PANEL
CO2: 28 mEq/L (ref 19–32)
Calcium: 9.2 mg/dL (ref 8.4–10.5)
Chloride: 106 mEq/L (ref 96–112)
Potassium: 3.9 mEq/L (ref 3.5–5.1)
Sodium: 140 mEq/L (ref 135–145)

## 2012-08-17 ENCOUNTER — Other Ambulatory Visit: Payer: Self-pay | Admitting: Cardiology

## 2012-08-17 DIAGNOSIS — I739 Peripheral vascular disease, unspecified: Secondary | ICD-10-CM

## 2012-08-18 ENCOUNTER — Encounter (INDEPENDENT_AMBULATORY_CARE_PROVIDER_SITE_OTHER): Payer: Self-pay

## 2012-08-18 DIAGNOSIS — I739 Peripheral vascular disease, unspecified: Secondary | ICD-10-CM

## 2012-08-18 DIAGNOSIS — I70219 Atherosclerosis of native arteries of extremities with intermittent claudication, unspecified extremity: Secondary | ICD-10-CM

## 2012-08-21 ENCOUNTER — Ambulatory Visit (HOSPITAL_COMMUNITY)
Admission: RE | Admit: 2012-08-21 | Discharge: 2012-08-21 | Disposition: A | Discharge status: Home / Self Care | Payer: Self-pay | Source: Ambulatory Visit | Attending: Cardiovascular Disease | Admitting: Cardiovascular Disease

## 2012-08-21 DIAGNOSIS — I2589 Other forms of chronic ischemic heart disease: Secondary | ICD-10-CM | POA: Insufficient documentation

## 2012-08-21 DIAGNOSIS — I255 Ischemic cardiomyopathy: Secondary | ICD-10-CM

## 2012-08-21 MED ORDER — GADOBENATE DIMEGLUMINE 529 MG/ML IV SOLN
27.0000 mL | Freq: Once | INTRAVENOUS | Status: AC | PRN
  Administered 2012-08-21: 27 mL via INTRAVENOUS

## 2012-08-25 ENCOUNTER — Telehealth: Payer: Self-pay | Admitting: Cardiovascular Disease

## 2012-08-25 NOTE — Telephone Encounter (Signed)
PT  AWARE OF  TEST  RESULTS  ./CY 

## 2012-08-25 NOTE — Telephone Encounter (Signed)
F/u  Returning call back to nurse.  

## 2012-08-26 ENCOUNTER — Ambulatory Visit (INDEPENDENT_AMBULATORY_CARE_PROVIDER_SITE_OTHER): Payer: Self-pay | Admitting: Cardiovascular Disease

## 2012-08-26 ENCOUNTER — Encounter: Payer: Self-pay | Admitting: Cardiovascular Disease

## 2012-08-26 VITALS — BP 132/78 | HR 74 | Ht 69.0 in | Wt 168.8 lb

## 2012-08-26 DIAGNOSIS — I739 Peripheral vascular disease, unspecified: Secondary | ICD-10-CM

## 2012-08-26 NOTE — Assessment & Plan Note (Addendum)
The patient overall has mild bilateral calf claudication which does not seem to be lifestyle limiting at this point. The recent back and buttock discomfort is not consistently exertional and unlikely to represent claudication. ABI was mildly reduced bilaterally at 0.81. Duplex ultrasound showed evidence of long occlusions in both SFA with reconstitution via collaterals. Due to long occlusions in the SFA, these lesions are not easily treated with by endovascular means with an overall low patency rate even if the procedure is successful. Due to that, and given that her symptoms are mild and not lifestyle limiting, I recommend continuing medical therapy and a walking program. I mostly discussed with her the importance of smoking cessation to prevent further progression and potential limb loss and amputation. Given her history of CAD and PVD, she should benefit from treatment with a statin. Yearly ABI is recommended. I do not recommend treatment with cilostazol due to heart failure. She can followup with me as needed if her symptoms or ABI worsen.

## 2012-08-26 NOTE — Progress Notes (Signed)
Karen  This is a 56 year old African American female who was referred by Dr. Eden Emms for evaluation and management of peripheral arterial disease. The patient has extensive cardiac history including CAD with previous stenting as well as chronic systolic heart failure with severely reduced LV systolic function. Recent cardiac MRI showed an ejection fraction of 28%. The patient is currently being evaluated for an ICD. Karen is known to have occluded left SFA for many years but previous records are not available. The patient describes prolonged history of bilateral calf discomfort with moderate walking. Karen is able to go up one flight of stairs and walk more than a block without having to stop. Karen is able to perform activities of daily living. [name], Karen had back and buttock discomfort mostly on the right side. That was actually brief and has not been consistent with her activities and exercise.  Allergies  Allergen Reactions  . Clopidogrel Bisulfate     REACTION: Rash     Current Outpatient Prescriptions on File Prior to Visit  Medication Sig Dispense Refill  . aspirin 81 MG tablet Take 81 mg by mouth daily.      . carvedilol (COREG) 12.5 MG tablet Take 1 tablet (12.5 mg total) by mouth 2 (two) times daily.  60 tablet  11  . losartan (COZAAR) 50 MG tablet Take 1 tablet (50 mg total) by mouth daily.  30 tablet  11     Past Medical History  Diagnosis Date  . HYPERLIPIDEMIA-MIXED 02/07/2009  . HYPERTENSION, BENIGN 08/22/2009  . BEN HTN HEART DISEASE WITHOUT HEART FAIL 08/28/2009  . CAD, NATIVE VESSEL 02/07/2009  . CARDIOMYOPATHY, PRIMARY, DILATED 02/07/2009  . VENTRICULAR TACHYCARDIA 05/21/2010  . SYSTOLIC HEART FAILURE, CHRONIC 02/07/2009  . CHEST PAIN-PRECORDIAL 07/18/2009  . CARDIOVASCULAR FUNCTION STUDY, ABNORMAL 07/18/2009     No past surgical history on file.   No family history on file.   History   Social History  . Marital Status: Single    Spouse Name: N/A    Number of  Children: N/A  . Years of Education: N/A   Occupational History  . Not on file.   Social History Main Topics  . Smoking status: Current Every Day Smoker  . Smokeless tobacco: Not on file  . Alcohol Use: Not on file  . Drug Use: Not on file  . Sexually Active: Not on file   Other Topics Concern  . Not on file   Social History Narrative  . No narrative on file     PHYSICAL EXAM   BP 132/78  Pulse 74  Ht 5\' 9"  (1.753 m)  Wt 168 lb 12 oz (76.544 kg)  BMI 24.92 kg/m2  SpO2 93% Constitutional: Karen is oriented to person, place, and time. Karen appears well-developed and well-nourished. No distress.  HENT: No nasal discharge.  Head: Normocephalic and atraumatic.  Eyes: Pupils are equal and round. Right eye exhibits no discharge. Left eye exhibits no discharge.  Neck: Normal range of motion. Neck supple. No JVD present. No thyromegaly present. No carotid bruits Cardiovascular: Normal rate, regular rhythm with premature beats, normal heart sounds. Exam reveals no gallop and no friction rub. No murmur heard.  Pulmonary/Chest: Effort normal and breath sounds normal. No stridor. No respiratory distress. Karen has no wheezes. Karen has no rales. Karen exhibits no tenderness.  Abdominal: Soft. Bowel sounds are normal. Karen exhibits no distension. There is no tenderness. There is no rebound and no guarding.  Musculoskeletal: Normal range of motion. Karen  exhibits no edema and no tenderness.  Neurological: Karen is alert and oriented to person, place, and time. Coordination normal.  Skin: Skin is warm and dry. No rash noted. Karen is not diaphoretic. No erythema. No pallor.  Psychiatric: Karen has a normal mood and affect. Her behavior is normal. Judgment and thought content normal.  Vascular: Femoral pulses slightly diminished on the right side and normal on the left side. Distal pulses are not palpable.    ASSESSMENT AND PLAN

## 2012-08-26 NOTE — Patient Instructions (Addendum)
Your physician recommends that you schedule a follow-up appointment in: only if needed  

## 2012-08-31 ENCOUNTER — Institutional Professional Consult (permissible substitution): Payer: Self-pay | Admitting: Internal Medicine

## 2012-09-17 ENCOUNTER — Institutional Professional Consult (permissible substitution): Payer: Self-pay | Admitting: Internal Medicine

## 2012-10-10 HISTORY — PX: CARDIAC DEFIBRILLATOR PLACEMENT: SHX171

## 2012-10-14 ENCOUNTER — Ambulatory Visit (INDEPENDENT_AMBULATORY_CARE_PROVIDER_SITE_OTHER): Payer: Medicaid Other | Admitting: Internal Medicine

## 2012-10-14 ENCOUNTER — Encounter: Payer: Self-pay | Admitting: *Deleted

## 2012-10-14 ENCOUNTER — Encounter: Payer: Self-pay | Admitting: Internal Medicine

## 2012-10-14 ENCOUNTER — Encounter (HOSPITAL_COMMUNITY): Payer: Self-pay | Admitting: Pharmacy Technician

## 2012-10-14 VITALS — BP 149/89 | HR 73 | Ht 69.5 in | Wt 163.8 lb

## 2012-10-14 DIAGNOSIS — I509 Heart failure, unspecified: Secondary | ICD-10-CM

## 2012-10-14 DIAGNOSIS — I5022 Chronic systolic (congestive) heart failure: Secondary | ICD-10-CM

## 2012-10-14 DIAGNOSIS — I10 Essential (primary) hypertension: Secondary | ICD-10-CM

## 2012-10-14 LAB — BASIC METABOLIC PANEL
Calcium: 9.3 mg/dL (ref 8.4–10.5)
Chloride: 108 mEq/L (ref 96–112)
Creatinine, Ser: 0.6 mg/dL (ref 0.4–1.2)

## 2012-10-14 LAB — APTT: aPTT: 28.5 s (ref 21.7–28.8)

## 2012-10-14 LAB — CBC WITH DIFFERENTIAL/PLATELET
Basophils Relative: 0.6 % (ref 0.0–3.0)
Eosinophils Absolute: 0.5 10*3/uL (ref 0.0–0.7)
Lymphocytes Relative: 40.6 % (ref 12.0–46.0)
MCHC: 33.8 g/dL (ref 30.0–36.0)
Monocytes Relative: 6.8 % (ref 3.0–12.0)
Neutrophils Relative %: 45.8 % (ref 43.0–77.0)
RBC: 5.02 Mil/uL (ref 3.87–5.11)
WBC: 8.4 10*3/uL (ref 4.5–10.5)

## 2012-10-14 LAB — PROTIME-INR: INR: 1 ratio (ref 0.8–1.0)

## 2012-10-14 NOTE — Assessment & Plan Note (Signed)
The patient's heart failure symptoms are class II. She has left ventricular dysfunction with an ejection fraction of 29% by cardiac MRI scanning. She is on medical therapy with beta blockers and ARB drugs. She has an ischemic cardiomyopathy. I've discussed the treatment options with the patient including the risk, benefits, goals, and expectations of ICD implantation and she wishes to proceed.

## 2012-10-14 NOTE — Assessment & Plan Note (Signed)
Her blood pressure is fairly well controlled. She will continue her current medical therapy and maintain a low-sodium diet. Ultimately, she may require up titration of her medical therapy.

## 2012-10-14 NOTE — Progress Notes (Signed)
HPI [name] is referred today by Dr. Eden Emms for consideration for prophylactic ICD implantation. The patient is a very pleasant 57 year old woman who sustained an inferior myocardial infarction in 2008. At that time she underwent stenting to the left circumflex coronary artery. The patient has a history of tobacco abuse and is currently smoking less than 5 cigarettes a day and actively trying to quit. She is on beta blocker therapy with carvedilol 12.5 mg twice daily and Cozaar 50 mg daily. Her blood pressure has been fairly well-controlled. She has not had syncope. She has class II heart failure symptoms. She denies recurrent chest pain. The patient has had no recent episodes of syncope and she denies peripheral edema. Evaluation by Dr. Eden Emms with a cardiac MRI scan demonstrates an ejection fraction of 29%. Prior studies dating back to 2010 demonstrate an ejection fraction of 30% by echo. Allergies  Allergen Reactions  . Clopidogrel Bisulfate     REACTION: Rash     Current Outpatient Prescriptions  Medication Sig Dispense Refill  . aspirin 81 MG tablet Take 81 mg by mouth daily.      . carvedilol (COREG) 12.5 MG tablet Take 1 tablet (12.5 mg total) by mouth 2 (two) times daily.  60 tablet  11  . losartan (COZAAR) 50 MG tablet Take 1 tablet (50 mg total) by mouth daily.  30 tablet  11     Past Medical History  Diagnosis Date  . HYPERLIPIDEMIA-MIXED 02/07/2009  . HYPERTENSION, BENIGN 08/22/2009  . BEN HTN HEART DISEASE WITHOUT HEART FAIL 08/28/2009  . CAD, NATIVE VESSEL 02/07/2009  . CARDIOMYOPATHY, PRIMARY, DILATED 02/07/2009  . VENTRICULAR TACHYCARDIA 05/21/2010  . SYSTOLIC HEART FAILURE, CHRONIC 02/07/2009  . CHEST PAIN-PRECORDIAL 07/18/2009  . CARDIOVASCULAR FUNCTION STUDY, ABNORMAL 07/18/2009    ROS:   All systems reviewed and negative except as noted in the HPI.   No past surgical history on file.   No family history on file.   History   Social History  . Marital Status:  Single    Spouse Name: N/A    Number of Children: N/A  . Years of Education: N/A   Occupational History  . Not on file.   Social History Main Topics  . Smoking status: Current Every Day Smoker  . Smokeless tobacco: Not on file  . Alcohol Use: Not on file  . Drug Use: Not on file  . Sexually Active: Not on file   Other Topics Concern  . Not on file   Social History Narrative  . No narrative on file     BP 149/89  Pulse 73  Ht 5' 9.5" (1.765 m)  Wt 163 lb 12.8 oz (74.299 kg)  BMI 23.84 kg/m2  Physical Exam:  Well appearing NAD HEENT: Unremarkable Neck:  7 cm JVD, no thyromegally Lungs:  Clear with no wheezes, rales, or rhonchi. HEART:  Regular rate rhythm, no murmurs, no rubs, no clicks Abd:  soft, positive bowel sounds, no organomegally, no rebound, no guarding Ext:  2 plus pulses, no edema, no cyanosis, no clubbing Skin:  No rashes no nodules Neuro:  CN II through XII intact, motor grossly intact  EKG normal sinus rhythm with normal axis and intervals. QRS duration is 110 ms.   Assess/Plan:

## 2012-10-20 MED ORDER — SODIUM CHLORIDE 0.9 % IR SOLN
80.0000 mg | Status: DC
  Filled 2012-10-20 (×3): qty 2

## 2012-10-20 MED ORDER — CEFAZOLIN SODIUM-DEXTROSE 2-3 GM-% IV SOLR
2.0000 g | INTRAVENOUS | Status: DC
  Filled 2012-10-20: qty 50

## 2012-10-21 ENCOUNTER — Ambulatory Visit (HOSPITAL_COMMUNITY)
Admission: RE | Admit: 2012-10-21 | Discharge: 2012-10-22 | Disposition: A | Discharge status: Home / Self Care | Payer: Medicaid Other | Source: Ambulatory Visit | Attending: Internal Medicine | Admitting: Internal Medicine

## 2012-10-21 ENCOUNTER — Encounter (HOSPITAL_COMMUNITY)
Admission: RE | Disposition: A | Discharge status: Home / Self Care | Payer: Self-pay | Source: Ambulatory Visit | Attending: Internal Medicine

## 2012-10-21 ENCOUNTER — Encounter (HOSPITAL_COMMUNITY): Payer: Self-pay | Admitting: *Deleted

## 2012-10-21 DIAGNOSIS — I509 Heart failure, unspecified: Secondary | ICD-10-CM | POA: Insufficient documentation

## 2012-10-21 DIAGNOSIS — I252 Old myocardial infarction: Secondary | ICD-10-CM | POA: Insufficient documentation

## 2012-10-21 DIAGNOSIS — I739 Peripheral vascular disease, unspecified: Secondary | ICD-10-CM | POA: Insufficient documentation

## 2012-10-21 DIAGNOSIS — Z9861 Coronary angioplasty status: Secondary | ICD-10-CM | POA: Insufficient documentation

## 2012-10-21 DIAGNOSIS — I2589 Other forms of chronic ischemic heart disease: Secondary | ICD-10-CM

## 2012-10-21 DIAGNOSIS — I2510 Atherosclerotic heart disease of native coronary artery without angina pectoris: Secondary | ICD-10-CM | POA: Insufficient documentation

## 2012-10-21 DIAGNOSIS — F17200 Nicotine dependence, unspecified, uncomplicated: Secondary | ICD-10-CM | POA: Insufficient documentation

## 2012-10-21 DIAGNOSIS — I5022 Chronic systolic (congestive) heart failure: Secondary | ICD-10-CM | POA: Insufficient documentation

## 2012-10-21 HISTORY — DX: Tobacco use: Z72.0

## 2012-10-21 HISTORY — DX: Peripheral vascular disease, unspecified: I73.9

## 2012-10-21 HISTORY — DX: Atherosclerotic heart disease of native coronary artery without angina pectoris: I25.10

## 2012-10-21 HISTORY — PX: IMPLANTABLE CARDIOVERTER DEFIBRILLATOR IMPLANT: SHX5473

## 2012-10-21 SURGERY — IMPLANTABLE CARDIOVERTER DEFIBRILLATOR IMPLANT
Anesthesia: LOCAL

## 2012-10-21 MED ORDER — CARVEDILOL 12.5 MG PO TABS
12.5000 mg | ORAL_TABLET | Freq: Two times a day (BID) | ORAL | Status: DC
  Administered 2012-10-21 – 2012-10-22 (×2): 12.5 mg via ORAL
  Filled 2012-10-21 (×4): qty 1

## 2012-10-21 MED ORDER — SODIUM CHLORIDE 0.45 % IV SOLN
INTRAVENOUS | Status: DC
  Administered 2012-10-21: 07:00:00 via INTRAVENOUS

## 2012-10-21 MED ORDER — LIDOCAINE HCL (PF) 1 % IJ SOLN
INTRAMUSCULAR | Status: AC
  Filled 2012-10-21: qty 60

## 2012-10-21 MED ORDER — ASPIRIN EC 81 MG PO TBEC
81.0000 mg | DELAYED_RELEASE_TABLET | Freq: Every day | ORAL | Status: DC
  Administered 2012-10-21 – 2012-10-22 (×2): 81 mg via ORAL
  Filled 2012-10-21 (×2): qty 1

## 2012-10-21 MED ORDER — MIDAZOLAM HCL 5 MG/5ML IJ SOLN
INTRAMUSCULAR | Status: AC
  Filled 2012-10-21: qty 5

## 2012-10-21 MED ORDER — MUPIROCIN 2 % EX OINT
TOPICAL_OINTMENT | CUTANEOUS | Status: AC
  Filled 2012-10-21: qty 22

## 2012-10-21 MED ORDER — SODIUM CHLORIDE 0.9 % IJ SOLN: 3.0000 mL | INTRAMUSCULAR | Status: DC | PRN

## 2012-10-21 MED ORDER — ACETAMINOPHEN 325 MG PO TABS
325.0000 mg | ORAL_TABLET | ORAL | Status: DC | PRN
  Administered 2012-10-21 – 2012-10-22 (×4): 650 mg via ORAL
  Filled 2012-10-21 (×4): qty 2

## 2012-10-21 MED ORDER — CHLORHEXIDINE GLUCONATE 4 % EX LIQD: 60.0000 mL | Freq: Once | CUTANEOUS | Status: DC

## 2012-10-21 MED ORDER — LOSARTAN POTASSIUM 50 MG PO TABS
50.0000 mg | ORAL_TABLET | Freq: Every day | ORAL | Status: DC
  Administered 2012-10-21 – 2012-10-22 (×2): 50 mg via ORAL
  Filled 2012-10-21 (×2): qty 1

## 2012-10-21 MED ORDER — ASPIRIN 81 MG PO TABS: 81.0000 mg | ORAL_TABLET | Freq: Every day | ORAL | Status: DC

## 2012-10-21 MED ORDER — MUPIROCIN 2 % EX OINT
TOPICAL_OINTMENT | Freq: Two times a day (BID) | CUTANEOUS | Status: DC
  Administered 2012-10-21: 1 via NASAL
  Administered 2012-10-22: 10:00:00 via NASAL

## 2012-10-21 MED ORDER — ONDANSETRON HCL 4 MG/2ML IJ SOLN: 4.0000 mg | Freq: Four times a day (QID) | INTRAMUSCULAR | Status: DC | PRN

## 2012-10-21 MED ORDER — SODIUM CHLORIDE 0.9 % IV SOLN: 250.0000 mL | INTRAVENOUS | Status: DC

## 2012-10-21 MED ORDER — CEFAZOLIN SODIUM-DEXTROSE 2-3 GM-% IV SOLR
INTRAVENOUS | Status: AC
  Filled 2012-10-21: qty 50

## 2012-10-21 MED ORDER — FENTANYL CITRATE 0.05 MG/ML IJ SOLN
INTRAMUSCULAR | Status: AC
  Filled 2012-10-21: qty 2

## 2012-10-21 MED ORDER — SODIUM CHLORIDE 0.9 % IJ SOLN: 3.0000 mL | Freq: Two times a day (BID) | INTRAMUSCULAR | Status: DC

## 2012-10-21 NOTE — H&P (View-Only) (Signed)
HPI [name] is referred today by Dr. Nishan for consideration for prophylactic ICD implantation. The patient is a very pleasant 57-year-old woman who sustained an inferior myocardial infarction in 2008. At that time she underwent stenting to the left circumflex coronary artery. The patient has a history of tobacco abuse and is currently smoking less than 5 cigarettes a day and actively trying to quit. She is on beta blocker therapy with carvedilol 12.5 mg twice daily and Cozaar 50 mg daily. Her blood pressure has been fairly well-controlled. She has not had syncope. She has class II heart failure symptoms. She denies recurrent chest pain. The patient has had no recent episodes of syncope and she denies peripheral edema. Evaluation by Dr. Nishan with a cardiac MRI scan demonstrates an ejection fraction of 29%. Prior studies dating back to 2010 demonstrate an ejection fraction of 30% by echo. Allergies  Allergen Reactions  . Clopidogrel Bisulfate     REACTION: Rash     Current Outpatient Prescriptions  Medication Sig Dispense Refill  . aspirin 81 MG tablet Take 81 mg by mouth daily.      . carvedilol (COREG) 12.5 MG tablet Take 1 tablet (12.5 mg total) by mouth 2 (two) times daily.  60 tablet  11  . losartan (COZAAR) 50 MG tablet Take 1 tablet (50 mg total) by mouth daily.  30 tablet  11     Past Medical History  Diagnosis Date  . HYPERLIPIDEMIA-MIXED 02/07/2009  . HYPERTENSION, BENIGN 08/22/2009  . BEN HTN HEART DISEASE WITHOUT HEART FAIL 08/28/2009  . CAD, NATIVE VESSEL 02/07/2009  . CARDIOMYOPATHY, PRIMARY, DILATED 02/07/2009  . VENTRICULAR TACHYCARDIA 05/21/2010  . SYSTOLIC HEART FAILURE, CHRONIC 02/07/2009  . CHEST PAIN-PRECORDIAL 07/18/2009  . CARDIOVASCULAR FUNCTION STUDY, ABNORMAL 07/18/2009    ROS:   All systems reviewed and negative except as noted in the HPI.   No past surgical history on file.   No family history on file.   History   Social History  . Marital Status:  Single    Spouse Name: N/A    Number of Children: N/A  . Years of Education: N/A   Occupational History  . Not on file.   Social History Main Topics  . Smoking status: Current Every Day Smoker  . Smokeless tobacco: Not on file  . Alcohol Use: Not on file  . Drug Use: Not on file  . Sexually Active: Not on file   Other Topics Concern  . Not on file   Social History Narrative  . No narrative on file     BP 149/89  Pulse 73  Ht 5' 9.5" (1.765 m)  Wt 163 lb 12.8 oz (74.299 kg)  BMI 23.84 kg/m2  Physical Exam:  Well appearing NAD HEENT: Unremarkable Neck:  7 cm JVD, no thyromegally Lungs:  Clear with no wheezes, rales, or rhonchi. HEART:  Regular rate rhythm, no murmurs, no rubs, no clicks Abd:  soft, positive bowel sounds, no organomegally, no rebound, no guarding Ext:  2 plus pulses, no edema, no cyanosis, no clubbing Skin:  No rashes no nodules Neuro:  CN II through XII intact, motor grossly intact  EKG normal sinus rhythm with normal axis and intervals. QRS duration is 110 ms.   Assess/Plan:  

## 2012-10-21 NOTE — Op Note (Signed)
NAMECORTLYN, [name] NO.:  [id number]  MEDICAL RECORD NO.:  [id number]  LOCATION:  [facility]                         FACILITY:  [facility]  PHYSICIAN:  Doylene Canning. Ladona Ridgel, MD    DATE OF BIRTH:  [birth year]  DATE OF PROCEDURE:  10/21/2012 DATE OF DISCHARGE:                              OPERATIVE REPORT   PROCEDURE PERFORMED:  Insertion of a single-chamber defibrillator.  INDICATION:  Ischemic cardiomyopathy status post MI with chronic systolic heart failure, EF 29% by MRI despite maximal medical therapy with carvedilol and losartan.  INTRODUCTION:  The patient is a 57 year old woman status post MI with a history of coronary artery disease, and chronic systolic heart failure class 2.  Her ejection fraction is 29% by MRI scan.  She is on maximal medical therapy with a beta blocker and a losartan.  She is now referred for ICD implantation.  PROCEDURE:  After informed consent was obtained, the patient was taken to the diagnostic EP lab in a fasting state.  After usual preparation and draping, intravenous fentanyl and midazolam was given for sedation. A 30 mL of lidocaine was infiltrated into the left infraclavicular region.  A 7-cm incision was carried out over this region and electrocautery was utilized to dissect down to the fascial plane.  The left subclavian vein was then punctured and the Medtronic model 6935 65- cm active fixation defibrillation lead, serial #[id number] was advanced to the right ventricle.  Mapping was carried out.  At the final site, the R-waves measured 9 mV.  The pacing impedance was initially 1000 ohms and the threshold 0.9 V at 0.5 milliseconds.  A 10 V pacing did not stimulate the diaphragm and with active fixation of the lead, there was a large injury current.  With these satisfactory parameters, the lead was secured to the subpectoral fascia with a figure-of-eight silk suture.  Sewing sleeve was secured with silk suture. Electrocautery was  utilized to make a subcutaneous pocket.  Antibiotic irrigation was utilized to irrigate the pocket, and electrocautery was utilized to assure hemostasis.  The Medtronic Evera XT VR single-chamber defibrillator, Serial [id number] was connected to the defibrillation lead and placed back in the subcutaneous pocket.  The pocket was irrigated with antibiotic irrigation.  The incision was closed with 2-0 and 3-0 Vicryl. Benzoin and Steri-Strips were applied in the skin.  A pressure dressing was applied.  The patient was returned to her room in satisfactory condition.  COMPLICATIONS:  There were no immediate procedure complications.  RESULTS:  This demonstrate successful implantation of a Medtronic single- chamber defibrillator in a patient with ischemic cardiomyopathy, chronic systolic heart failure, ejection fraction 29%.     Doylene Canning. Ladona Ridgel, MD     GWT/MEDQ  D:  10/21/2012  T:  10/21/2012  Job:  [id number]  cc:   Noralyn Pick. Eden Emms, MD, [facility]

## 2012-10-21 NOTE — Op Note (Signed)
ICD implant via the left subclavian vein without immediate complication. J#[id number].

## 2012-10-21 NOTE — Progress Notes (Signed)
Pt had a 4 beat run of V.tach, pt asymptomatic, notified PA.

## 2012-10-21 NOTE — Progress Notes (Signed)
Utilization Review Completed.   Esaiah Wanless, RN, BSN Nurse Case Manager  [phone]  

## 2012-10-21 NOTE — Interval H&P Note (Signed)
History and Physical Interval Note:  10/21/2012 8:37 AM  [name]  has presented today for surgery, with the diagnosis of vtach and chf  The various methods of treatment have been discussed with the patient and family. After consideration of risks, benefits and other options for treatment, the patient has consented to  Procedure(s): IMPLANTABLE CARDIOVERTER DEFIBRILLATOR IMPLANT (N/A) as a surgical intervention .  The patient's history has been reviewed, patient examined, no change in status, stable for surgery.  I have reviewed the patient's chart and labs.  Questions were answered to the patient's satisfaction.     Leonia Reeves.D.

## 2012-10-22 ENCOUNTER — Ambulatory Visit (HOSPITAL_COMMUNITY): Payer: Medicaid Other

## 2012-10-22 ENCOUNTER — Encounter (HOSPITAL_COMMUNITY): Payer: Self-pay | Admitting: Physician Assistant

## 2012-10-22 DIAGNOSIS — I5022 Chronic systolic (congestive) heart failure: Secondary | ICD-10-CM

## 2012-10-22 MED ORDER — NITROGLYCERIN 0.4 MG SL SUBL: 0.4000 mg | SUBLINGUAL_TABLET | SUBLINGUAL | Status: DC | PRN

## 2012-10-22 NOTE — Progress Notes (Signed)
DC IV, DC Tele, DC Home. Discharge instructions and home medications discussed with patient. Patient denied any questions or concerns at this time. Patient leaving unit via wheelchair and appears in no acute distress.  

## 2012-10-22 NOTE — Progress Notes (Signed)
Subjective:  No CP or SOB  Objective:  Vital Signs in the last 24 hours: Temp:  [97.4 F (36.3 C)-98.6 F (37 C)] 97.4 F (36.3 C) (02/13 0554) Pulse Rate:  [71-79] 77 (02/13 0554) Resp:  [18-20] 18 (02/13 0554) BP: (133-164)/(77-111) 164/98 mmHg (02/13 0554) SpO2:  [92 %-96 %] 92 % (02/13 0554) Weight:  [160 lb 8 oz (72.802 kg)] 160 lb 8 oz (72.802 kg) (02/13 0700)  Intake/Output from previous day: 02/12 0701 - 02/13 0700 In: 480 [P.O.:480] Out: 450 [Urine:450] Intake/Output from this shift: Total I/O In: 360 [P.O.:360] Out: -   Physical Exam: Well appearing NAD HEENT: Unremarkable Neck:  No JVD  Lungs:  Clear HEART:  Regular rate rhythm, no murmurs, no rubs, no clicks Abd:  Flat, positive bowel sounds, no organomegally, no rebound, no guarding Ext:  no edema  Tele:  SR.  PVCs   Lab Results: No results found for this basename: WBC, HGB, PLT,  in the last 72 hours No results found for this basename: NA, K, CL, CO2, GLUCOSE, BUN, CREATININE,  in the last 72 hours No results found for this basename: TROPONINI, CK, MB,  in the last 72 hours Hepatic Function Panel No results found for this basename: PROT, ALBUMIN, AST, ALT, ALKPHOS, BILITOT, BILIDIR, IBILI,  in the last 72 hours No results found for this basename: CHOL,  in the last 72 hours No results found for this basename: PROTIME,  in the last 72 hours  Imaging: Dg Chest 2 View  10/22/2012  *RADIOLOGY REPORT*  Clinical Data: Status post AICD placement.  CHEST - 2 VIEW  Comparison: Chest x-ray of 08/12/2012.  Findings: There has been interval placement of a left-sided AICD with lead tip projecting over the expected location of the right ventricular apex.  No pneumothorax.  No acute consolidative airspace disease.  No pleural effusions.  Lung volumes are low with some minimal linear bibasilar opacities favored to reflect subsegmental atelectasis.  No evidence of pulmonary edema.  Mild cardiomegaly.  IMPRESSION: 1.   Interval placement of AICD without acute complicating features, as above. 2.  Mild cardiomegaly.   Original Report Authenticated By: Trudie Reed, M.D.     Cardiac Studies:  Assessment/Plan:   1.  S/p ICD  Site looks good.  CXR OK  Will confirm interrogation Patient will have instructions at D/C  2.  CHF  Volume looks good  No changes.  Patient cannot get ride due to weather.  When ride available can go home.  LOS: 1 day    Lewayne Bunting 10/22/2012, 8:58 AM

## 2012-10-22 NOTE — Discharge Summary (Signed)
Discharge Summary   Patient ID: [name] MRN: [id number], DOB/AGE: 55-07-1956 55 y.o. Admit date: 10/21/2012 D/C date:     10/22/2012  Primary Cardiologist: Nishan/Adhya Cocco(EP)  Primary Discharge Diagnoses:  1. Chronic systolic CHF with EF 29%, s/p prophylactic Medtronic single-chamber ICD implantation 10/21/12  Secondary Discharge Diagnoses:  1. CAD s/p inf-post MI 2008 s/p MS to large marginal ofCx 2. Tobacco abuse 3. HTN 4. HL 5. H/o ventricular tachycardia 6. PVD - eval by Dr. Kirke Corin 08/2012 with abnormal ABIs  Hospital Course: [name] is a 57 y/o F with history of CAD s/p inf MI 2008 s/p stenting to LCx, PVD, HTN, tobacco abuse who was referred to Dr. Ladona Ridgel for consideration for prophylactic ICD implantation. She is on beta blocker therapy with carvedilol 12.5 mg twice daily and Cozaar 50 mg daily. Her blood pressure has been fairly well-controlled. She has not had syncope. She has class II heart failure symptoms. She denied recurrent chest pain. The patient has had no recent episodes of syncope and she denied peripheral edema. Evaluation by Dr. Eden Emms with a cardiac MRI scan demonstrates an ejection fraction of 29%. Prior studies dating back to 2010 demonstrate an ejection fraction of 30% by echo. She was admitted 10/21/12 and underwent insertion of a Medtronic single chamber ICD without complication. Today the patient is doing well. Dr. Tenny Craw has seen and examined her and feels she is stable for discharge. Note she is not on a statin, will defer to primary cardiologist for consideration.   Discharge Vitals: Blood pressure 143/83, pulse 74, temperature 97.4 F (36.3 C), temperature source Oral, resp. rate 18, height 5' 9.5" (1.765 m), weight 160 lb 8 oz (72.802 kg), SpO2 92.00%.  Labs: Lab Results  Component Value Date   WBC 8.4 10/14/2012   HGB 15.6* 10/14/2012   HCT 46.3* 10/14/2012   MCV 92.1 10/14/2012   PLT 201.0 10/14/2012    Diagnostic Studies/Procedures   1. ICD as  above 2. Dg Chest 2 View 10/22/2012  *RADIOLOGY REPORT*  Clinical Data: Status post AICD placement.  CHEST - 2 VIEW  Comparison: Chest x-ray of 08/12/2012.  Findings: There has been interval placement of a left-sided AICD with lead tip projecting over the expected location of the right ventricular apex.  No pneumothorax.  No acute consolidative airspace disease.  No pleural effusions.  Lung volumes are low with some minimal linear bibasilar opacities favored to reflect subsegmental atelectasis.  No evidence of pulmonary edema.  Mild cardiomegaly.  IMPRESSION: 1.  Interval placement of AICD without acute complicating features, as above. 2.  Mild cardiomegaly.   Original Report Authenticated By: Trudie Reed, M.D.     Discharge Medications     Medication List    TAKE these medications       acetaminophen 500 MG tablet  Commonly known as:  TYLENOL  Take 500 mg by mouth every 6 (six) hours as needed. For headache     aspirin 81 MG tablet  Take 81 mg by mouth daily.     carvedilol 12.5 MG tablet  Commonly known as:  COREG  Take 1 tablet (12.5 mg total) by mouth 2 (two) times daily.     losartan 50 MG tablet  Commonly known as:  COZAAR  Take 1 tablet (50 mg total) by mouth daily.     nitroGLYCERIN 0.4 MG SL tablet  Commonly known as:  NITROSTAT  Place 1 tablet (0.4 mg total) under the tongue every 5 (five) minutes as needed for  chest pain (up to 3 doses).      Since NTG was not on home med list, will provide rx given hx of CAD. Note no recent ischemic sx.  Disposition   The patient will be discharged in stable condition to home. Discharge Orders   Future Appointments Provider Department Dept Phone   11/02/2012 2:30 PM [facility] [address] Main Office [place]) [phone]   11/09/2012 10:30 AM Wendall Stade, MD [facility] Main Office [place]) [phone]   01/29/2013 9:30 AM Marinus Maw, MD [facility] Main Office [place]) [phone]   Future  Orders Complete By Expires     Diet - low sodium heart healthy  As directed     Discharge instructions  As directed     Comments:      Your prior heart studies showed weakness of the heart muscle. This may make you more susceptible to weight gain from fluid retention, which can lead to symptoms that we call heart failure. For patients with this condition, we give them these special instructions:  1. Follow a low-salt diet and watch your fluid intake. In general, you should not be taking in more than 2 liters of fluid per day. Some patients are restricted to less than 1.5 liters. This includes sources of water in foods like soup. 2. Weigh yourself on the same scale at same time of day and keep a log. 3. Call your doctor: (Anytime you feel any of the following symptoms)  - 3-4 pound weight gain in 1-2 days or 2 pounds overnight  - Shortness of breath, with or without a dry hacking cough  - Swelling in the hands, feet or stomach  - If you have to sleep on extra pillows at night in order to breathe    Increase activity slowly  As directed     Comments:      Please see attached sheet at end of After-Visit Summary for instructions on wound care, activity, and bathing.      Follow-up Information   Follow up with [facility] On 11/02/2012. (At 2:30 PM for wound check)    Contact information:   [address] [place] [address] [phone]      Follow up with Lewayne Bunting, MD On 01/29/2013. (At 9:30 AM)    Contact information:   [address] [place] [address] [phone]      Follow up with Charlton Haws, MD On 11/09/2012. (At 10:30 AM)    Contact information:   [address] [place] [address] [phone]        Duration of Discharge Encounter: Greater than 30 minutes including physician and PA time.  Signed, Ronie Spies PA-C 10/22/2012, 12:02 PM     EP Attending  Patient seen and examined. Agree with above.  Ok to discharge. Usual followup.  Leonia Reeves.D.

## 2012-10-22 NOTE — Progress Notes (Signed)
Pt HR dropped down to 39 nonsustained. Monitor strip had small pacing spike on strip. BP 164/98. Pt asymptomatic. MD notified and no new orders. Will continue to monitor and assess. Baron Hamper RN 10/22/2012 3:46 AM

## 2012-10-22 NOTE — Clinical Social Work Note (Signed)
Patient medically stable for discharge home and needs assistance with transport. CSW facilitated transport home via cab.  Genelle Bal, MSW, LCSW [phone]

## 2012-11-02 ENCOUNTER — Encounter: Payer: Self-pay | Admitting: Internal Medicine

## 2012-11-02 ENCOUNTER — Other Ambulatory Visit: Payer: Self-pay | Admitting: Internal Medicine

## 2012-11-02 ENCOUNTER — Ambulatory Visit (INDEPENDENT_AMBULATORY_CARE_PROVIDER_SITE_OTHER): Payer: Medicaid Other | Admitting: *Deleted

## 2012-11-02 DIAGNOSIS — I428 Other cardiomyopathies: Secondary | ICD-10-CM

## 2012-11-02 DIAGNOSIS — Z95810 Presence of automatic (implantable) cardiac defibrillator: Secondary | ICD-10-CM

## 2012-11-02 LAB — ICD DEVICE OBSERVATION
BRDY-0002RV: 40 {beats}/min
HV IMPEDENCE: 62 Ohm
RV LEAD THRESHOLD: 0.5 V

## 2012-11-02 NOTE — Progress Notes (Signed)
Pt seen in clinic for follow up of recently implanted ICD. Wound well healed.   No complaints of chest pain, shortness of breath, dizziness, palpitations, or shocks.  Device functioning normally at this time.  For full details, see Pace[facility] report.  No programming changes made today.  Plan to follow up in 3 months with Dr Annamarie Dawley, RN, BSN 11/02/2012 2:05 PM

## 2012-11-09 ENCOUNTER — Ambulatory Visit (INDEPENDENT_AMBULATORY_CARE_PROVIDER_SITE_OTHER): Payer: Medicaid Other | Admitting: Cardiovascular Disease

## 2012-11-09 ENCOUNTER — Encounter: Payer: Self-pay | Admitting: Cardiovascular Disease

## 2012-11-09 VITALS — BP 120/70 | HR 70 | Ht 69.0 in | Wt 166.0 lb

## 2012-11-09 DIAGNOSIS — E785 Hyperlipidemia, unspecified: Secondary | ICD-10-CM

## 2012-11-09 DIAGNOSIS — I472 Ventricular tachycardia: Secondary | ICD-10-CM

## 2012-11-09 NOTE — Assessment & Plan Note (Signed)
Euvolemic continue current meds  Functional class one

## 2012-11-09 NOTE — Assessment & Plan Note (Signed)
Cholesterol is at goal.  Continue current dose of statin and diet Rx.  No myalgias or side effects.  F/U  LFT's in 6 months. Lab Results  Component Value Date   LDLCALC  Value: 73        Total Cholesterol/HDL:CHD Risk Coronary Heart Disease Risk Table                     Men   Women  1/2 Average Risk   3.4   3.3  Average Risk       5.0   4.4  2 X Average Risk   9.6   7.1  3 X Average Risk  23.4   11.0        Use the calculated Patient Ratio above and the CHD Risk Table to determine the patient's CHD Risk.        ATP III CLASSIFICATION (LDL):  <100     mg/dL   Optimal  811-914  mg/dL   Near or Above                    Optimal  130-159  mg/dL   Borderline  782-956  mg/dL   High  >213     mg/dL   Very High 0/86/5784

## 2012-11-09 NOTE — Assessment & Plan Note (Signed)
S/P AICD  F/U Dr Ladona Ridgel in May

## 2012-11-09 NOTE — Assessment & Plan Note (Signed)
Well controlled.  Continue current medications and low sodium Dash type diet.    

## 2012-11-09 NOTE — Patient Instructions (Signed)
Your physician wants you to follow-up in:  November WITH DR Haywood Filler will receive a reminder letter in the mail two months in advance. If you don't receive a letter, please call our office to schedule the follow-up appointment. Your physician recommends that you continue on your current medications as directed. Please refer to the Current Medication list given to you today.

## 2012-11-09 NOTE — Progress Notes (Signed)
Patient ID: [name], female   DOB: 1955/12/17, 57 y.o.   MRN: [id number] Previous patient of Dr Juanda Chance Referred by primary as she has not had f/u since he retired History of BMS to circumflex in 2008. EF has been as low as 30-35% but not assessed in the last 3 years. Reviewed cath and she had small diagonal disease as well. Previous LE duplex suggested left SFA occlusion. Plavix apparently causes a rash. She is still smoking counseled for less than 10 minutes. Failed Chantix in past. About 1ppd. Does not have primary doctor. She has mild exertional dyspnea. No chest pain. Occational flip flops and palpitations. Compliant with meds. No syncope Gets right hip and buttock pain with walking that is likely claudications Not taking meds ran out.   F/U MRI showed inferior scar and EF 29%  Seen by Dr Ladona Ridgel and AICD placed 10/21/12  ROS: Denies fever, malais, weight loss, blurry vision, decreased visual acuity, cough, sputum, SOB, hemoptysis, pleuritic pain, palpitaitons, heartburn, abdominal pain, melena, lower extremity edema, claudication, or rash.  All other systems reviewed and negative  General: Affect appropriate Healthy:  appears stated age HEENT: normal Neck supple with no adenopathy JVP normal no bruits no thyromegaly Lungs clear with no wheezing and good diaphragmatic motion Heart:  S1/S2 no murmur, no rub, gallop or click PMI normal Abdomen: benighn, BS positve, no tenderness, no AAA no bruit.  No HSM or HJR Distal pulses intact with no bruits No edema Neuro non-focal Skin warm and dry No muscular weakness   Current Outpatient Prescriptions  Medication Sig Dispense Refill  . acetaminophen (TYLENOL) 500 MG tablet Take 500 mg by mouth every 6 (six) hours as needed. For headache      . aspirin 81 MG tablet Take 81 mg by mouth daily.      . carvedilol (COREG) 12.5 MG tablet Take 1 tablet (12.5 mg total) by mouth 2 (two) times daily.  60 tablet  11  . losartan (COZAAR) 50 MG tablet  Take 1 tablet (50 mg total) by mouth daily.  30 tablet  11  . nitroGLYCERIN (NITROSTAT) 0.4 MG SL tablet Place 1 tablet (0.4 mg total) under the tongue every 5 (five) minutes as needed for chest pain (up to 3 doses).  25 tablet  4   No current facility-administered medications for this visit.    Allergies  Clopidogrel bisulfate  Electrocardiogram: 2/15 SR PVC IMI   Assessment and Plan

## 2012-11-09 NOTE — Assessment & Plan Note (Signed)
Stable with no angina and good activity level.  Continue medical Rx  

## 2012-11-10 ENCOUNTER — Ambulatory Visit: Payer: Self-pay | Admitting: Cardiovascular Disease

## 2013-01-29 ENCOUNTER — Encounter: Payer: Self-pay | Admitting: Internal Medicine

## 2013-01-29 ENCOUNTER — Ambulatory Visit (INDEPENDENT_AMBULATORY_CARE_PROVIDER_SITE_OTHER): Payer: Self-pay | Admitting: Internal Medicine

## 2013-01-29 VITALS — BP 160/100 | HR 87 | Ht 69.0 in | Wt 161.6 lb

## 2013-01-29 DIAGNOSIS — I10 Essential (primary) hypertension: Secondary | ICD-10-CM

## 2013-01-29 DIAGNOSIS — I5022 Chronic systolic (congestive) heart failure: Secondary | ICD-10-CM

## 2013-01-29 DIAGNOSIS — I428 Other cardiomyopathies: Secondary | ICD-10-CM

## 2013-01-29 DIAGNOSIS — Z95810 Presence of automatic (implantable) cardiac defibrillator: Secondary | ICD-10-CM

## 2013-01-29 LAB — ICD DEVICE OBSERVATION
DEV-0020ICD: NEGATIVE
HV IMPEDENCE: 76 Ohm
RV LEAD AMPLITUDE: 14.5 mv
VENTRICULAR PACING ICD: 0.1 pct

## 2013-01-29 NOTE — Assessment & Plan Note (Signed)
Her Medtronic ICD is working normally. We'll plan to recheck in several months. 

## 2013-01-29 NOTE — Assessment & Plan Note (Signed)
Her blood pressure is elevated this morning. She did not take her medications. I've encouraged her to take her medicines as she gets home, and maintain a low-sodium diet.

## 2013-01-29 NOTE — Assessment & Plan Note (Signed)
Her heart failure is class II. I discussed the importance of taking her medications, maintain a low-sodium diet,and regular activity.

## 2013-01-29 NOTE — Patient Instructions (Addendum)
Your physician recommends that you schedule a follow-up appointment in: 3 months in the device clinic and 10/2012 with Dr Ladona Ridgel

## 2013-01-29 NOTE — Progress Notes (Signed)
HPI [name] returns today for followup. She is a very pleasant 57 year old woman with a history of an ischemic cardiomyopathy, status post myocardial infarction, with chronic class II systolic heart failure. She is status post ICD implantation 3 months ago. Since then she has been stable. She does not check her blood pressure regularly. Today her blood pressure is elevated, but she notes that she has not taken her medications today. She denies any ICD shock, or syncope. No peripheral edema. Allergies  Allergen Reactions  . Clopidogrel Bisulfate     REACTION: Rash     Current Outpatient Prescriptions  Medication Sig Dispense Refill  . acetaminophen (TYLENOL) 500 MG tablet Take 500 mg by mouth every 6 (six) hours as needed. For headache      . aspirin 81 MG tablet Take 81 mg by mouth daily.      . carvedilol (COREG) 12.5 MG tablet Take 1 tablet (12.5 mg total) by mouth 2 (two) times daily.  60 tablet  11  . losartan (COZAAR) 50 MG tablet Take 1 tablet (50 mg total) by mouth daily.  30 tablet  11  . nitroGLYCERIN (NITROSTAT) 0.4 MG SL tablet Place 1 tablet (0.4 mg total) under the tongue every 5 (five) minutes as needed for chest pain (up to 3 doses).  25 tablet  4   No current facility-administered medications for this visit.     Past Medical History  Diagnosis Date  . HYPERLIPIDEMIA-MIXED 02/07/2009  . HYPERTENSION, BENIGN 08/22/2009  . CAD (coronary artery disease)     a. Inf-post MI 2008 s/p BMS to large marginal of Cx.   Marland Kitchen VENTRICULAR TACHYCARDIA 05/21/2010  . SYSTOLIC HEART FAILURE, CHRONIC 02/07/2009    a. EF 30% 2010, 29% by MRI 08/2012. b. s/p prophylactic Medtronic ICD implantation 10/2012.  . Tobacco abuse   . PVD (peripheral vascular disease)     a. Evaluated by Dr. Kirke Corin 08/2012.    ROS:   All systems reviewed and negative except as noted in the HPI.   No past surgical history on file.   No family history on file.   History   Social History  . Marital Status:  Single    Spouse Name: N/A    Number of Children: N/A  . Years of Education: N/A   Occupational History  . Not on file.   Social History Main Topics  . Smoking status: Current Every Day Smoker  . Smokeless tobacco: Not on file  . Alcohol Use: Not on file  . Drug Use: Not on file  . Sexually Active: Not on file   Other Topics Concern  . Not on file   Social History Narrative  . No narrative on file     BP 160/100  Pulse 87  Ht 5\' 9"  (1.753 m)  Wt 161 lb 9.6 oz (73.301 kg)  BMI 23.85 kg/m2  Physical Exam:  Well appearing middle-age woman,NAD HEENT: Unremarkable Neck:  7 cmJVD, no thyromegally Back:  No CVA tenderness Lungs:  Clear with no wheezes, rales, or rhonchi. HEART:  Regular rate rhythm, no murmurs, no rubs, no clicks Abd:  soft, positive bowel sounds, no organomegally, no rebound, no guarding Ext:  2 plus pulses, no edema, no cyanosis, no clubbing Skin:  No rashes no nodules Neuro:  CN II through XII intact, motor grossly intact  DEVICE  Normal device function.  See PaceArt for details.   Assess/Plan:

## 2013-02-03 ENCOUNTER — Encounter (HOSPITAL_COMMUNITY): Payer: Self-pay | Admitting: *Deleted

## 2013-02-03 ENCOUNTER — Emergency Department (HOSPITAL_COMMUNITY)
Admission: EM | Admit: 2013-02-03 | Discharge: 2013-02-03 | Disposition: A | Discharge status: Home / Self Care | Payer: Medicaid Other | Attending: Emergency Medicine | Admitting: Emergency Medicine

## 2013-02-03 DIAGNOSIS — I5022 Chronic systolic (congestive) heart failure: Secondary | ICD-10-CM | POA: Insufficient documentation

## 2013-02-03 DIAGNOSIS — Y929 Unspecified place or not applicable: Secondary | ICD-10-CM | POA: Insufficient documentation

## 2013-02-03 DIAGNOSIS — Z7982 Long term (current) use of aspirin: Secondary | ICD-10-CM | POA: Insufficient documentation

## 2013-02-03 DIAGNOSIS — Z8639 Personal history of other endocrine, nutritional and metabolic disease: Secondary | ICD-10-CM | POA: Insufficient documentation

## 2013-02-03 DIAGNOSIS — I10 Essential (primary) hypertension: Secondary | ICD-10-CM | POA: Insufficient documentation

## 2013-02-03 DIAGNOSIS — S0501XA Injury of conjunctiva and corneal abrasion without foreign body, right eye, initial encounter: Secondary | ICD-10-CM

## 2013-02-03 DIAGNOSIS — Y9389 Activity, other specified: Secondary | ICD-10-CM | POA: Insufficient documentation

## 2013-02-03 DIAGNOSIS — Z8679 Personal history of other diseases of the circulatory system: Secondary | ICD-10-CM | POA: Insufficient documentation

## 2013-02-03 DIAGNOSIS — S058X9A Other injuries of unspecified eye and orbit, initial encounter: Secondary | ICD-10-CM | POA: Insufficient documentation

## 2013-02-03 DIAGNOSIS — Z79899 Other long term (current) drug therapy: Secondary | ICD-10-CM | POA: Insufficient documentation

## 2013-02-03 DIAGNOSIS — IMO0002 Reserved for concepts with insufficient information to code with codable children: Secondary | ICD-10-CM | POA: Insufficient documentation

## 2013-02-03 DIAGNOSIS — F17200 Nicotine dependence, unspecified, uncomplicated: Secondary | ICD-10-CM | POA: Insufficient documentation

## 2013-02-03 DIAGNOSIS — Z862 Personal history of diseases of the blood and blood-forming organs and certain disorders involving the immune mechanism: Secondary | ICD-10-CM | POA: Insufficient documentation

## 2013-02-03 DIAGNOSIS — I2510 Atherosclerotic heart disease of native coronary artery without angina pectoris: Secondary | ICD-10-CM | POA: Insufficient documentation

## 2013-02-03 MED ORDER — FLUORESCEIN SODIUM 1 MG OP STRP
1.0000 | ORAL_STRIP | Freq: Once | OPHTHALMIC | Status: AC
  Administered 2013-02-03: 1 via OPHTHALMIC
  Filled 2013-02-03: qty 1

## 2013-02-03 MED ORDER — ERYTHROMYCIN 5 MG/GM OP OINT
TOPICAL_OINTMENT | Freq: Three times a day (TID) | OPHTHALMIC | Status: DC
  Administered 2013-02-03: 09:00:00 via OPHTHALMIC
  Filled 2013-02-03: qty 1

## 2013-02-03 MED ORDER — IBUPROFEN 800 MG PO TABS
800.0000 mg | ORAL_TABLET | Freq: Once | ORAL | Status: AC
  Administered 2013-02-03: 800 mg via ORAL
  Filled 2013-02-03: qty 1

## 2013-02-03 MED ORDER — TETRACAINE HCL 0.5 % OP SOLN
1.0000 [drp] | Freq: Once | OPHTHALMIC | Status: AC
  Administered 2013-02-03: 1 [drp] via OPHTHALMIC
  Filled 2013-02-03: qty 2

## 2013-02-03 NOTE — ED Notes (Signed)
Pt reports rubbing her left eye this am and scratching it with her fingernail. Now having pain and blurred vision.

## 2013-02-03 NOTE — ED Provider Notes (Signed)
History     CSN: [id number]  Arrival date & time 02/03/13  0745   First MD Initiated Contact with Patient 02/03/13 [phone]      Chief Complaint  Patient presents with  . Eye Injury    (Consider location/radiation/quality/duration/timing/severity/associated sxs/prior treatment) The history is provided by the patient.  [name] is a 57 y.o. female history of hyperlipidemia, hypertension, CAD here presenting with left eye pain. She was scratching her eye this morning and accidentally scratched left high with her nails. She has some blurry vision afterwards but denies any purulent drainage or fevers. She wears glasses but no contact lenses.   Past Medical History  Diagnosis Date  . HYPERLIPIDEMIA-MIXED 02/07/2009  . HYPERTENSION, BENIGN 08/22/2009  . CAD (coronary artery disease)     a. Inf-post MI 2008 s/p BMS to large marginal of Cx.   Marland Kitchen VENTRICULAR TACHYCARDIA 05/21/2010  . SYSTOLIC HEART FAILURE, CHRONIC 02/07/2009    a. EF 30% 2010, 29% by MRI 08/2012. b. s/p prophylactic Medtronic ICD implantation 10/2012.  . Tobacco abuse   . PVD (peripheral vascular disease)     a. Evaluated by Dr. Kirke Corin 08/2012.    History reviewed. No pertinent past surgical history.  History reviewed. No pertinent family history.  History  Substance Use Topics  . Smoking status: Current Every Day Smoker  . Smokeless tobacco: Not on file  . Alcohol Use: Not on file    OB History   Grav Para Term Preterm Abortions TAB SAB Ect Mult Living                  Review of Systems  HENT:       L eye pain and blurry vision   All other systems reviewed and are negative.    Allergies  Clopidogrel bisulfate  Home Medications   Current Outpatient Rx  Name  Route  Sig  Dispense  Refill  . acetaminophen (TYLENOL) 500 MG tablet   Oral   Take 500 mg by mouth every 6 (six) hours as needed. For headache         . aspirin 81 MG tablet   Oral   Take 81 mg by mouth daily.         . carvedilol  (COREG) 12.5 MG tablet   Oral   Take 1 tablet (12.5 mg total) by mouth 2 (two) times daily.   60 tablet   11   . Dextromethorphan HBr (TUSSIN COUGH PO)   Oral   Take 30 mLs by mouth 2 (two) times daily as needed (head cold).         Marland Kitchen losartan (COZAAR) 50 MG tablet   Oral   Take 1 tablet (50 mg total) by mouth daily.   30 tablet   11   . nitroGLYCERIN (NITROSTAT) 0.4 MG SL tablet   Sublingual   Place 1 tablet (0.4 mg total) under the tongue every 5 (five) minutes as needed for chest pain (up to 3 doses).   25 tablet   4     BP 169/99  Pulse 82  Temp(Src) 97.5 F (36.4 C) (Oral)  Resp 18  SpO2 97%  Physical Exam  Nursing note and vitals reviewed. Constitutional: She is oriented to person, place, and time. She appears well-developed.  Slightly uncomfortable   HENT:  Head: Normocephalic.  Mouth/Throat: Oropharynx is clear and moist.  Eyes:    L conjunctiva slightly red. Pupils reactive. Vision 20/40 R eye and 20/50 L eye corrected.  Corneal abrasions as the figure indicates   Neck: Normal range of motion. Neck supple.  Cardiovascular: Normal rate.   Pulmonary/Chest: Effort normal.  Abdominal: Soft.  Musculoskeletal: Normal range of motion.  Neurological: She is alert and oriented to person, place, and time.  Skin: Skin is warm and dry.  Psychiatric: She has a normal mood and affect. Her behavior is normal. Judgment and thought content normal.    ED Course  Procedures (including critical care time)  Labs Reviewed - No data to display No results found.   No diagnosis found.    MDM  [name] is a 57 y.o. female here with L eye pain. Will need to r/o corneal abrasion so will stain eye with flurescene.   8:20 AM After tetracaine and flurescene, I used woods lamp to examine her eye. There seem to be increased uptake bilateral eyes, see exam above. Will give erythromycin drops to both eyes. No foreign bodies under eyelids. Recommend ophtho f/u.        Richardean Canal, MD 02/03/13 [phone]

## 2013-04-29 ENCOUNTER — Ambulatory Visit (INDEPENDENT_AMBULATORY_CARE_PROVIDER_SITE_OTHER): Payer: Medicaid Other | Admitting: *Deleted

## 2013-04-29 DIAGNOSIS — I428 Other cardiomyopathies: Secondary | ICD-10-CM

## 2013-04-29 DIAGNOSIS — I5022 Chronic systolic (congestive) heart failure: Secondary | ICD-10-CM

## 2013-04-29 LAB — ICD DEVICE OBSERVATION
BRDY-0002RV: 40 {beats}/min
DEV-0020ICD: NEGATIVE
HV IMPEDENCE: 73 Ohm

## 2013-04-29 NOTE — Progress Notes (Signed)
ICD check with ICM in office. 

## 2013-05-18 ENCOUNTER — Emergency Department (HOSPITAL_COMMUNITY)
Admission: EM | Admit: 2013-05-18 | Discharge: 2013-05-18 | Disposition: A | Discharge status: Home / Self Care | Payer: Medicaid Other | Attending: Emergency Medicine | Admitting: Emergency Medicine

## 2013-05-18 ENCOUNTER — Encounter (HOSPITAL_COMMUNITY): Payer: Self-pay | Admitting: *Deleted

## 2013-05-18 DIAGNOSIS — H53149 Visual discomfort, unspecified: Secondary | ICD-10-CM | POA: Insufficient documentation

## 2013-05-18 DIAGNOSIS — F17200 Nicotine dependence, unspecified, uncomplicated: Secondary | ICD-10-CM | POA: Insufficient documentation

## 2013-05-18 DIAGNOSIS — Z7982 Long term (current) use of aspirin: Secondary | ICD-10-CM | POA: Insufficient documentation

## 2013-05-18 DIAGNOSIS — I10 Essential (primary) hypertension: Secondary | ICD-10-CM | POA: Insufficient documentation

## 2013-05-18 DIAGNOSIS — H5789 Other specified disorders of eye and adnexa: Secondary | ICD-10-CM | POA: Insufficient documentation

## 2013-05-18 DIAGNOSIS — Z79899 Other long term (current) drug therapy: Secondary | ICD-10-CM | POA: Insufficient documentation

## 2013-05-18 DIAGNOSIS — H209 Unspecified iridocyclitis: Secondary | ICD-10-CM | POA: Insufficient documentation

## 2013-05-18 DIAGNOSIS — Z8679 Personal history of other diseases of the circulatory system: Secondary | ICD-10-CM | POA: Insufficient documentation

## 2013-05-18 DIAGNOSIS — E785 Hyperlipidemia, unspecified: Secondary | ICD-10-CM | POA: Insufficient documentation

## 2013-05-18 DIAGNOSIS — I2510 Atherosclerotic heart disease of native coronary artery without angina pectoris: Secondary | ICD-10-CM | POA: Insufficient documentation

## 2013-05-18 MED ORDER — FLUORESCEIN SODIUM 1 MG OP STRP
1.0000 | ORAL_STRIP | Freq: Once | OPHTHALMIC | Status: AC
  Administered 2013-05-18: 1 via OPHTHALMIC
  Filled 2013-05-18: qty 1

## 2013-05-18 MED ORDER — TETRACAINE HCL 0.5 % OP SOLN
1.0000 [drp] | Freq: Once | OPHTHALMIC | Status: AC
  Administered 2013-05-18: 1 [drp] via OPHTHALMIC
  Filled 2013-05-18: qty 2

## 2013-05-18 MED ORDER — KETOROLAC TROMETHAMINE 0.4 % OP SOLN: 1.0000 [drp] | OPHTHALMIC | Status: DC

## 2013-05-18 MED ORDER — CYCLOPENTOLATE HCL 0.5 % OP SOLN: 1.0000 [drp] | Freq: Four times a day (QID) | OPHTHALMIC | Status: DC

## 2013-05-18 MED ORDER — PREDNISOLONE ACETATE 1 % OP SUSP: 1.0000 [drp] | OPHTHALMIC | Status: DC

## 2013-05-18 NOTE — ED Notes (Signed)
Started with left eye irritation yesterday. No known injury.

## 2013-05-18 NOTE — ED Provider Notes (Signed)
CSN: [id number]     Arrival date & time 05/18/13  1153 History   First MD Initiated Contact with Patient 05/18/13 1233     Chief Complaint  Patient presents with  . Eye Pain   (Consider location/radiation/quality/duration/timing/severity/associated sxs/prior Treatment) HPI Comments: Patient presents with L eye pain that began yesterday. She first noticed after she awoke from sleep. During the day the pain became gradually worse. She is sensitive to light. She denies foreign body or injury to the eye. She had a referral from an ED visit several months ago and she called the ophthalmologist and has an appointment scheduled for 2 days. She notes slightly worse vision in her eye but no loss of vision. No history of autoimmune disease. No fevers or swelling of her face. Course is constant. Nothing makes symptoms better. Patient does not wear contact lenses.  Patient is a 57 y.o. female presenting with eye pain. The history is provided by the patient.  Eye Pain This is a new problem. The current episode started yesterday. The problem occurs constantly. The problem has been gradually worsening. Pertinent negatives include no abdominal pain, chest pain, coughing, fever, headaches, myalgias, nausea, rash, sore throat or vomiting.    Past Medical History  Diagnosis Date  . HYPERLIPIDEMIA-MIXED 02/07/2009  . HYPERTENSION, BENIGN 08/22/2009  . CAD (coronary artery disease)     a. Inf-post MI 2008 s/p BMS to large marginal of Cx.   Marland Kitchen VENTRICULAR TACHYCARDIA 05/21/2010  . SYSTOLIC HEART FAILURE, CHRONIC 02/07/2009    a. EF 30% 2010, 29% by MRI 08/2012. b. s/p prophylactic Medtronic ICD implantation 10/2012.  . Tobacco abuse   . PVD (peripheral vascular disease)     a. Evaluated by Dr. Kirke Corin 08/2012.   Past Surgical History  Procedure Laterality Date  . Cardiac defibrillator placement     No family history on file. History  Substance Use Topics  . Smoking status: Current Every Day Smoker  . Smokeless  tobacco: Not on file  . Alcohol Use: No   OB History   Grav Para Term Preterm Abortions TAB SAB Ect Mult Living                 Review of Systems  Constitutional: Negative for fever.  HENT: Negative for sore throat and rhinorrhea.   Eyes: Positive for photophobia, pain and redness. Negative for discharge, itching and visual disturbance.  Respiratory: Negative for cough.   Cardiovascular: Negative for chest pain.  Gastrointestinal: Negative for nausea, vomiting, abdominal pain and diarrhea.  Genitourinary: Negative for dysuria.  Musculoskeletal: Negative for myalgias.  Skin: Negative for rash.  Neurological: Negative for headaches.    Allergies  Clopidogrel bisulfate  Home Medications   Current Outpatient Rx  Name  Route  Sig  Dispense  Refill  . acetaminophen (TYLENOL) 500 MG tablet   Oral   Take 500 mg by mouth every 6 (six) hours as needed. For headache         . aspirin 81 MG tablet   Oral   Take 81 mg by mouth daily.         . carvedilol (COREG) 12.5 MG tablet   Oral   Take 1 tablet (12.5 mg total) by mouth 2 (two) times daily.   60 tablet   11   . Dextromethorphan HBr (TUSSIN COUGH PO)   Oral   Take 30 mLs by mouth 2 (two) times daily as needed (head cold).         Marland Kitchen losartan (  COZAAR) 50 MG tablet   Oral   Take 1 tablet (50 mg total) by mouth daily.   30 tablet   11   . nitroGLYCERIN (NITROSTAT) 0.4 MG SL tablet   Sublingual   Place 1 tablet (0.4 mg total) under the tongue every 5 (five) minutes as needed for chest pain (up to 3 doses).   25 tablet   4    BP 170/107  Pulse 99  Temp(Src) 97.9 F (36.6 C) (Oral)  Resp 18  SpO2 96% Physical Exam  Nursing note and vitals reviewed. Constitutional: She appears well-developed and well-nourished.  HENT:  Head: Normocephalic and atraumatic.  Eyes: Pupils are equal, round, and reactive to light. Right eye exhibits no chemosis, no discharge and no exudate. No foreign body present in the right eye.  Left eye exhibits no chemosis, no discharge and no exudate. No foreign body present in the left eye. Right conjunctiva is not injected. Right conjunctiva has no hemorrhage. Left conjunctiva is injected. Left conjunctiva has no hemorrhage. Right eye exhibits normal extraocular motion and no nystagmus. Left eye exhibits normal extraocular motion and no nystagmus.  Slit lamp exam:      The left eye shows corneal flare and fluorescein uptake. The left eye shows no corneal abrasion and no corneal ulcer.  Neck: Normal range of motion. Neck supple.  Pulmonary/Chest: No respiratory distress.  Neurological: She is alert.  Skin: Skin is warm and dry.  Psychiatric: She has a normal mood and affect.    ED Course  Procedures (including critical care time) Labs Review Labs Reviewed - No data to display Imaging Review No results found.  1:14 PM Patient seen and examined. Work-up initiated. 20/50 vision bilaterally.   Vital signs reviewed and are as follows: Filed Vitals:   05/18/13 1157  BP: 170/107  Pulse: 99  Temp: 97.9 F (36.6 C)  Resp: 18   Two drops of tetracaine/proparacaine instilled into affected eye.   Fluorescein strip applied to affected eye. Small area of uptake at 6 o'clock. No improvement of photosensitivity with drops. No foreign bodies noted. No visible hyphema.   Tonometry performed. Left eye pressure: 17  Patient tolerated procedure well without immediate complication.   1:25 PM D/w Dr. Loretha Stapler. Will call ophtho for reccs.   I spoke with Dr. Delaney Meigs. He recommends Pred forte q2hr, ketorolac drops every 2 hour (alternated with Pred forte), and Cyclogyl 4 times a day.  He would like to see the patient tomorrow morning at 9 AM in his office. I have taken down the office address.  Patient informed of these instructions and her questions were answered.  Patient told to return with any other concerns. Urged followup tomorrow as planned.    MDM   1. Iritis     Patient with gradual onset of left eye pain and photophobia. Symptoms are most consistent with iritis. Uncertain as to etiology. There is a small area of fluorescein uptake however I do not suspect that the patient's symptoms can be explained by a corneal abrasion (photophobia, no improvement with anesthetic, scleral injection). No foreign bodies noted. No surrounding erythema, swelling, vision changes/loss suspicious for orbital or periorbital cellulitis. No signs of glaucoma, intraocular pressure normal. No symptoms of retinal detachment. Urgent ophthalmologic followup obtained.     Renne Crigler, PA-C 05/18/13 1535

## 2013-05-18 NOTE — ED Notes (Signed)
Pt is here with complaints of left eye pain and states was seen here a couple of months ago for same thing.

## 2013-05-20 NOTE — ED Provider Notes (Signed)
Medical screening examination/treatment/procedure(s) were performed by non-physician practitioner and as supervising physician I was immediately available for consultation/collaboration.   Candyce Churn, MD 05/20/13 [phone]

## 2013-06-09 ENCOUNTER — Encounter: Payer: Self-pay | Admitting: Internal Medicine

## 2013-08-02 ENCOUNTER — Encounter: Payer: Self-pay | Admitting: Internal Medicine

## 2013-08-02 ENCOUNTER — Ambulatory Visit (INDEPENDENT_AMBULATORY_CARE_PROVIDER_SITE_OTHER): Payer: Medicaid Other | Admitting: *Deleted

## 2013-08-02 DIAGNOSIS — I472 Ventricular tachycardia: Secondary | ICD-10-CM

## 2013-08-02 DIAGNOSIS — I5022 Chronic systolic (congestive) heart failure: Secondary | ICD-10-CM

## 2013-08-02 LAB — MDC_IDC_ENUM_SESS_TYPE_INCLINIC
HighPow Impedance: 209 Ohm
Lead Channel Impedance Value: 513 Ohm
Lead Channel Pacing Threshold Amplitude: 0.5 V
Lead Channel Pacing Threshold Pulse Width: 0.4 ms
Lead Channel Sensing Intrinsic Amplitude: 12 mV
Lead Channel Setting Pacing Amplitude: 2.5 V
Lead Channel Setting Pacing Pulse Width: 0.4 ms
Zone Setting Detection Interval: 290 ms

## 2013-08-02 NOTE — Progress Notes (Signed)
ICD check in clinic. Normal device function. Threshold and sensing consistent with previous device measurements. Impedance trends stable over time. No evidence of any ventricular arrhythmias. Histogram distribution appropriate for patient and level of activity. No changes made this session. Device programmed at appropriate safety margins. Device programmed to optimize intrinsic conduction. Stable thoracic impedance. Estimated longevity 11 years. Pt enrolled in remote follow-up. Plan to follow up with [facility] in 10-2013. Patient education completed including shock plan. Alert tones demonstrated for patient.

## 2013-08-12 ENCOUNTER — Telehealth: Payer: Self-pay | Admitting: Cardiovascular Disease

## 2013-08-12 NOTE — Telephone Encounter (Signed)
New problem      Medicaid will not fill her heart med LASARTAN. Pt needs this changed to something she can take please or a preauthorizeation for this drug. Give her a call.

## 2013-08-13 NOTE — Telephone Encounter (Signed)
PRIOR AUTH  NUMBER [id number]

## 2013-08-13 NOTE — Telephone Encounter (Signed)
SPOKE   WITH  FACILITY    ATTEMPTING TO GET MED  PRIOR APPROVED  STAFF REFUSED TO   HELP  WITHOUT   CLAIM # CALLED PHARMACY AGAIN  STATES THAT  THEY DO NOT  HAVE ANYTHING OTHER THAN NUMBER  WILL CALL  FACILITY  TO GET MORE INFO.AWAITING  RETURN CALL FROM  PHARMACY /CY

## 2013-08-17 ENCOUNTER — Other Ambulatory Visit: Payer: Self-pay | Admitting: Cardiovascular Disease

## 2013-08-18 NOTE — Telephone Encounter (Signed)
PER PHARMACY  MEDICAID  WILL COVER  LISINOPRIL  IN PLACE  OF LOSARTAN WILL FORWARD TO  DR  Eden Emms  TO  MAKE  CHANGES .Zack Seal

## 2013-08-18 NOTE — Telephone Encounter (Signed)
PT  WILL STAY ON LOSARTAN PER MINDY  LOSARTAN IS ON  $4.00  LIST  AT  [facility] [facility]   PT CAN AFFORD  MED AT THIS TIME .[name]

## 2013-08-18 NOTE — Telephone Encounter (Signed)
Can call in lisinopril 20 mg

## 2013-08-24 ENCOUNTER — Encounter: Payer: Self-pay | Admitting: Cardiology

## 2013-08-24 ENCOUNTER — Ambulatory Visit (HOSPITAL_COMMUNITY): Payer: Medicaid Other | Attending: Cardiology

## 2013-08-24 DIAGNOSIS — I2510 Atherosclerotic heart disease of native coronary artery without angina pectoris: Secondary | ICD-10-CM | POA: Insufficient documentation

## 2013-08-24 DIAGNOSIS — I10 Essential (primary) hypertension: Secondary | ICD-10-CM | POA: Insufficient documentation

## 2013-08-24 DIAGNOSIS — I739 Peripheral vascular disease, unspecified: Secondary | ICD-10-CM

## 2013-08-24 DIAGNOSIS — F17200 Nicotine dependence, unspecified, uncomplicated: Secondary | ICD-10-CM | POA: Insufficient documentation

## 2013-10-14 ENCOUNTER — Encounter: Payer: Self-pay | Admitting: Internal Medicine

## 2013-10-19 ENCOUNTER — Other Ambulatory Visit: Payer: Self-pay | Admitting: Cardiovascular Disease

## 2013-10-28 ENCOUNTER — Encounter: Payer: Medicaid Other | Admitting: Internal Medicine

## 2013-11-02 ENCOUNTER — Encounter: Payer: Medicaid Other | Admitting: Internal Medicine

## 2013-11-10 ENCOUNTER — Encounter: Payer: Self-pay | Admitting: Internal Medicine

## 2013-11-11 ENCOUNTER — Ambulatory Visit (INDEPENDENT_AMBULATORY_CARE_PROVIDER_SITE_OTHER): Payer: Medicaid Other | Admitting: Internal Medicine

## 2013-11-11 ENCOUNTER — Encounter: Payer: Self-pay | Admitting: Internal Medicine

## 2013-11-11 VITALS — BP 140/88 | HR 81 | Ht 69.5 in | Wt 153.0 lb

## 2013-11-11 DIAGNOSIS — I4729 Other ventricular tachycardia: Secondary | ICD-10-CM

## 2013-11-11 DIAGNOSIS — I472 Ventricular tachycardia: Secondary | ICD-10-CM

## 2013-11-11 DIAGNOSIS — Z95810 Presence of automatic (implantable) cardiac defibrillator: Secondary | ICD-10-CM

## 2013-11-11 DIAGNOSIS — I5022 Chronic systolic (congestive) heart failure: Secondary | ICD-10-CM

## 2013-11-11 MED ORDER — LOSARTAN POTASSIUM 50 MG PO TABS: ORAL_TABLET | ORAL | Status: DC

## 2013-11-11 NOTE — Assessment & Plan Note (Signed)
Her chronic systolic heart failure is well compensated class 2. She will continue her current medications.

## 2013-11-11 NOTE — Assessment & Plan Note (Signed)
Interogation of her ICD demonstrates no sustained ventricular arrhythmias. No change in medications.

## 2013-11-11 NOTE — Patient Instructions (Signed)
Your physician wants you to follow-up in: 12 months with Dr Knox Saliva will receive a reminder letter in the mail two months in advance. If you don't receive a letter, please call our office to schedule the follow-up appointment.   Remote monitoring is used to monitor your Pacemaker or ICD from home. This monitoring reduces the number of office visits required to check your device to one time per year. It allows [place] to keep an eye on the functioning of your device to ensure it is working properly. You are scheduled for a device check from home on 02/10/14. You may send your transmission at any time that day. If you have a wireless device, the transmission will be sent automatically. After your physician reviews your transmission, you will receive a postcard with your next transmission date.

## 2013-11-11 NOTE — Progress Notes (Signed)
HPI [name] returns today for followup. She is a pleasant 58 yo woman with a h/o CAD, VT, chronic systolic heart failure, s/p ICD implant. She has done well in the interim. She denies chest pain, sob, or syncope. No ICD shock. Allergies  Allergen Reactions  . Clopidogrel Bisulfate     REACTION: Rash     Current Outpatient Prescriptions  Medication Sig Dispense Refill  . acetaminophen (TYLENOL) 500 MG tablet Take 500 mg by mouth every 6 (six) hours as needed. For headache      . aspirin 81 MG tablet Take 81 mg by mouth daily.      . carvedilol (COREG) 12.5 MG tablet TAKE 1 TABLET BY MOUTH TWICE A DAY  60 tablet  0  . cyclopentolate (CYCLOGYL) 0.5 % ophthalmic solution Place 1 drop into the left eye 4 (four) times daily.  15 mL  0  . Dextromethorphan HBr (TUSSIN COUGH PO) Take 30 mLs by mouth 2 (two) times daily as needed (head cold).      Marland Kitchen ketorolac (ACULAR) 0.4 % SOLN Place 1 drop into the left eye every 2 (two) hours while awake.  5 mL  0  . losartan (COZAAR) 50 MG tablet TAKE ONE TABLET BY MOUTH EVERY DAY  90 tablet  3  . nitroGLYCERIN (NITROSTAT) 0.4 MG SL tablet Place 1 tablet (0.4 mg total) under the tongue every 5 (five) minutes as needed for chest pain (up to 3 doses).  25 tablet  4  . prednisoLONE acetate (PRED FORTE) 1 % ophthalmic suspension Place 1 drop into the left eye every 2 (two) hours while awake.  5 mL  0   No current facility-administered medications for this visit.     Past Medical History  Diagnosis Date  . HYPERLIPIDEMIA-MIXED 02/07/2009  . HYPERTENSION, BENIGN 08/22/2009  . CAD (coronary artery disease)     a. Inf-post MI 2008 s/p BMS to large marginal of Cx.   Marland Kitchen VENTRICULAR TACHYCARDIA 05/21/2010  . SYSTOLIC HEART FAILURE, CHRONIC 02/07/2009    a. EF 30% 2010, 29% by MRI 08/2012. b. s/p prophylactic Medtronic ICD implantation 10/2012.  . Tobacco abuse   . PVD (peripheral vascular disease)     a. Evaluated by Dr. [name] Anon 08/2012.    ROS:   All  systems reviewed and negative except as noted in the HPI.   Past Surgical History  Procedure Laterality Date  . Cardiac defibrillator placement       No family history on file.   History   Social History  . Marital Status: Single    Spouse Name: N/A    Number of Children: N/A  . Years of Education: N/A   Occupational History  . Not on file.   Social History Main Topics  . Smoking status: Current Every Day Smoker  . Smokeless tobacco: Not on file  . Alcohol Use: No  . Drug Use: No  . Sexual Activity: Not on file   Other Topics Concern  . Not on file   Social History Narrative  . No narrative on file     BP 140/88  Pulse 81  Ht 5' 9.5" (1.765 m)  Wt 153 lb (69.4 kg)  BMI 22.28 kg/m2  Physical Exam:  Well appearing middle aged woman, NAD HEENT: Unremarkable Neck:  No JVD, no thyromegally Back:  No CVA tenderness Lungs:  Clear with no wheezes, rales, or rhonchi HEART:  Regular rate rhythm, no murmurs, no rubs, no clicks Abd:  soft, positive bowel sounds, no organomegally, no rebound, no guarding Ext:  2 plus pulses, no edema, no cyanosis, no clubbing Skin:  No rashes no nodules Neuro:  CN II through XII intact, motor grossly intact  DEVICE  Normal device function.  See PaceArt for details.   Assess/Plan:

## 2013-11-11 NOTE — Assessment & Plan Note (Signed)
Her Medtronic ICD is working normally. Will recheck in several months.

## 2013-12-09 ENCOUNTER — Other Ambulatory Visit: Payer: Self-pay | Admitting: Cardiovascular Disease

## 2014-02-10 ENCOUNTER — Encounter: Payer: Medicaid Other | Admitting: *Deleted

## 2014-02-11 ENCOUNTER — Telehealth: Payer: Self-pay | Admitting: Cardiology

## 2014-02-11 NOTE — Telephone Encounter (Signed)
Pt informed me that she is unable to send transmission because she does not have a home phone and no cell adapter. Can we get pt a cell adapter for her monitor and does pt need to be seen in office to have device check. Please call pt back at [phone]

## 2014-02-11 NOTE — Telephone Encounter (Signed)
Gave pt [place] #. Also made appt w/ device clinic for 03/02/14.

## 2014-02-11 NOTE — Telephone Encounter (Signed)
Left message with someone for pt to return call at [phone] and ask to speak with device clinc. Pt needs to be reminded about remote transmission that was due on 02-10-14

## 2014-02-14 ENCOUNTER — Encounter: Payer: Self-pay | Admitting: Cardiology

## 2014-02-24 ENCOUNTER — Other Ambulatory Visit: Payer: Self-pay

## 2014-02-24 MED ORDER — CARVEDILOL 12.5 MG PO TABS: ORAL_TABLET | ORAL | Status: DC

## 2014-03-02 ENCOUNTER — Encounter: Payer: Self-pay | Admitting: Internal Medicine

## 2014-03-02 ENCOUNTER — Ambulatory Visit (INDEPENDENT_AMBULATORY_CARE_PROVIDER_SITE_OTHER): Payer: Medicaid Other | Admitting: *Deleted

## 2014-03-02 DIAGNOSIS — I428 Other cardiomyopathies: Secondary | ICD-10-CM

## 2014-03-02 DIAGNOSIS — I5022 Chronic systolic (congestive) heart failure: Secondary | ICD-10-CM

## 2014-03-02 DIAGNOSIS — I472 Ventricular tachycardia, unspecified: Secondary | ICD-10-CM

## 2014-03-02 DIAGNOSIS — I4729 Other ventricular tachycardia: Secondary | ICD-10-CM

## 2014-03-02 LAB — MDC_IDC_ENUM_SESS_TYPE_INCLINIC
Battery Remaining Longevity: 131 mo
Date Time Interrogation Session: 20150624100428
HIGH POWER IMPEDANCE MEASURED VALUE: 77 Ohm
HighPow Impedance: 190 Ohm
Lead Channel Impedance Value: 513 Ohm
Lead Channel Pacing Threshold Amplitude: 0.625 V
Lead Channel Pacing Threshold Pulse Width: 0.4 ms
Lead Channel Sensing Intrinsic Amplitude: 10.5 mV
Lead Channel Setting Pacing Amplitude: 2.5 V
MDC IDC MSMT BATTERY VOLTAGE: 3.03 V
MDC IDC MSMT LEADCHNL RV SENSING INTR AMPL: 10.875 mV
MDC IDC SET LEADCHNL RV PACING PULSEWIDTH: 0.4 ms
MDC IDC SET LEADCHNL RV SENSING SENSITIVITY: 0.3 mV
MDC IDC SET ZONE DETECTION INTERVAL: 290 ms
MDC IDC SET ZONE DETECTION INTERVAL: 360 ms
MDC IDC STAT BRADY RV PERCENT PACED: 0.03 %
Zone Setting Detection Interval: 400 ms

## 2014-03-02 NOTE — Progress Notes (Signed)
ICD check in clinic. Normal device function. Threshold and sensing consistent with previous device measurements. SIC=0. Impedance trends stable over time. No evidence of any ventricular arrhythmias. Histogram distribution appropriate for patient and level of activity. No changes made this session. Device programmed at appropriate safety margins. Device programmed to optimize intrinsic conduction. Estimated longevity 10.9 years. Plan to follow up with the [facility] on 9-24 and with [facility] in 11-2014. Patient education completed including shock plan. Alert tones demonstrated for patient.

## 2014-05-02 ENCOUNTER — Encounter: Payer: Self-pay | Admitting: Internal Medicine

## 2014-05-02 ENCOUNTER — Telehealth: Payer: Self-pay | Admitting: Internal Medicine

## 2014-05-02 NOTE — Telephone Encounter (Signed)
Follow up:   [name],Karen w/ [facility] ser. Needs a call back on status of [employer]  from please. Fax to [phone] Attn [name],C

## 2014-05-03 NOTE — Telephone Encounter (Signed)
Tried to call the nu,ber listed for [facility] and it is out of order

## 2014-05-18 ENCOUNTER — Encounter (HOSPITAL_COMMUNITY): Payer: Self-pay | Admitting: Emergency Medicine

## 2014-05-18 ENCOUNTER — Emergency Department (HOSPITAL_COMMUNITY): Payer: Medicare Other

## 2014-05-18 ENCOUNTER — Observation Stay (HOSPITAL_COMMUNITY)
Admission: EM | Admit: 2014-05-18 | Discharge: 2014-05-19 | Disposition: A | Discharge status: Home / Self Care | Payer: Medicare Other | Attending: Internal Medicine | Admitting: Internal Medicine

## 2014-05-18 DIAGNOSIS — R29898 Other symptoms and signs involving the musculoskeletal system: Principal | ICD-10-CM | POA: Diagnosis present

## 2014-05-18 DIAGNOSIS — I10 Essential (primary) hypertension: Secondary | ICD-10-CM | POA: Insufficient documentation

## 2014-05-18 DIAGNOSIS — R262 Difficulty in walking, not elsewhere classified: Secondary | ICD-10-CM | POA: Diagnosis not present

## 2014-05-18 DIAGNOSIS — D45 Polycythemia vera: Secondary | ICD-10-CM

## 2014-05-18 DIAGNOSIS — Z9861 Coronary angioplasty status: Secondary | ICD-10-CM | POA: Insufficient documentation

## 2014-05-18 DIAGNOSIS — F17200 Nicotine dependence, unspecified, uncomplicated: Secondary | ICD-10-CM | POA: Insufficient documentation

## 2014-05-18 DIAGNOSIS — R9430 Abnormal result of cardiovascular function study, unspecified: Secondary | ICD-10-CM

## 2014-05-18 DIAGNOSIS — E440 Moderate protein-calorie malnutrition: Secondary | ICD-10-CM | POA: Diagnosis not present

## 2014-05-18 DIAGNOSIS — R209 Unspecified disturbances of skin sensation: Secondary | ICD-10-CM | POA: Insufficient documentation

## 2014-05-18 DIAGNOSIS — I4729 Other ventricular tachycardia: Secondary | ICD-10-CM

## 2014-05-18 DIAGNOSIS — R072 Precordial pain: Secondary | ICD-10-CM

## 2014-05-18 DIAGNOSIS — E785 Hyperlipidemia, unspecified: Secondary | ICD-10-CM

## 2014-05-18 DIAGNOSIS — Z95810 Presence of automatic (implantable) cardiac defibrillator: Secondary | ICD-10-CM | POA: Insufficient documentation

## 2014-05-18 DIAGNOSIS — Z79899 Other long term (current) drug therapy: Secondary | ICD-10-CM | POA: Diagnosis not present

## 2014-05-18 DIAGNOSIS — I672 Cerebral atherosclerosis: Secondary | ICD-10-CM | POA: Insufficient documentation

## 2014-05-18 DIAGNOSIS — G459 Transient cerebral ischemic attack, unspecified: Secondary | ICD-10-CM

## 2014-05-18 DIAGNOSIS — R200 Anesthesia of skin: Secondary | ICD-10-CM | POA: Insufficient documentation

## 2014-05-18 DIAGNOSIS — I252 Old myocardial infarction: Secondary | ICD-10-CM | POA: Diagnosis not present

## 2014-05-18 DIAGNOSIS — I739 Peripheral vascular disease, unspecified: Secondary | ICD-10-CM

## 2014-05-18 DIAGNOSIS — IMO0002 Reserved for concepts with insufficient information to code with codable children: Secondary | ICD-10-CM | POA: Insufficient documentation

## 2014-05-18 DIAGNOSIS — I2510 Atherosclerotic heart disease of native coronary artery without angina pectoris: Secondary | ICD-10-CM | POA: Diagnosis not present

## 2014-05-18 DIAGNOSIS — I509 Heart failure, unspecified: Secondary | ICD-10-CM | POA: Insufficient documentation

## 2014-05-18 DIAGNOSIS — E782 Mixed hyperlipidemia: Secondary | ICD-10-CM | POA: Diagnosis not present

## 2014-05-18 DIAGNOSIS — I428 Other cardiomyopathies: Secondary | ICD-10-CM

## 2014-05-18 DIAGNOSIS — I119 Hypertensive heart disease without heart failure: Secondary | ICD-10-CM

## 2014-05-18 DIAGNOSIS — I5022 Chronic systolic (congestive) heart failure: Secondary | ICD-10-CM

## 2014-05-18 DIAGNOSIS — Z7982 Long term (current) use of aspirin: Secondary | ICD-10-CM | POA: Diagnosis not present

## 2014-05-18 DIAGNOSIS — D751 Secondary polycythemia: Secondary | ICD-10-CM

## 2014-05-18 DIAGNOSIS — I472 Ventricular tachycardia: Secondary | ICD-10-CM

## 2014-05-18 HISTORY — DX: Transient cerebral ischemic attack, unspecified: G45.9

## 2014-05-18 HISTORY — DX: Presence of automatic (implantable) cardiac defibrillator: Z95.810

## 2014-05-18 LAB — CBC WITH DIFFERENTIAL/PLATELET
Basophils Absolute: 0 10*3/uL (ref 0.0–0.1)
Basophils Relative: 1 % (ref 0–1)
Eosinophils Absolute: 0.3 10*3/uL (ref 0.0–0.7)
Eosinophils Relative: 5 % (ref 0–5)
HCT: 51.5 % — ABNORMAL HIGH (ref 36.0–46.0)
HEMOGLOBIN: 17.8 g/dL — AB (ref 12.0–15.0)
LYMPHS PCT: 41 % (ref 12–46)
Lymphs Abs: 2.3 10*3/uL (ref 0.7–4.0)
MCH: 33.2 pg (ref 26.0–34.0)
MCHC: 34.6 g/dL (ref 30.0–36.0)
MCV: 96.1 fL (ref 78.0–100.0)
MONOS PCT: 11 % (ref 3–12)
Monocytes Absolute: 0.6 10*3/uL (ref 0.1–1.0)
NEUTROS ABS: 2.5 10*3/uL (ref 1.7–7.7)
NEUTROS PCT: 42 % — AB (ref 43–77)
Platelets: 131 10*3/uL — ABNORMAL LOW (ref 150–400)
RBC: 5.36 MIL/uL — ABNORMAL HIGH (ref 3.87–5.11)
RDW: 13.7 % (ref 11.5–15.5)
WBC: 5.8 10*3/uL (ref 4.0–10.5)

## 2014-05-18 LAB — COMPREHENSIVE METABOLIC PANEL
ALK PHOS: 97 U/L (ref 39–117)
ALT: 23 U/L (ref 0–35)
ANION GAP: 15 (ref 5–15)
AST: 50 U/L — ABNORMAL HIGH (ref 0–37)
Albumin: 3.7 g/dL (ref 3.5–5.2)
BILIRUBIN TOTAL: 0.6 mg/dL (ref 0.3–1.2)
BUN: 6 mg/dL (ref 6–23)
CO2: 22 mEq/L (ref 19–32)
Calcium: 9.3 mg/dL (ref 8.4–10.5)
Chloride: 104 mEq/L (ref 96–112)
Creatinine, Ser: 0.46 mg/dL — ABNORMAL LOW (ref 0.50–1.10)
GFR calc non Af Amer: >90 mL/min (ref >90)
GLUCOSE: 96 mg/dL (ref 70–99)
POTASSIUM: 4.7 meq/L (ref 3.7–5.3)
Sodium: 141 mEq/L (ref 137–147)
Total Protein: 7.7 g/dL (ref 6.0–8.3)

## 2014-05-18 LAB — RAPID URINE DRUG SCREEN, HOSP PERFORMED
AMPHETAMINES: NOT DETECTED
BENZODIAZEPINES: NOT DETECTED
Barbiturates: NOT DETECTED
Cocaine: NOT DETECTED
OPIATES: NOT DETECTED
Tetrahydrocannabinol: NOT DETECTED

## 2014-05-18 LAB — CBC
HEMATOCRIT: 48.2 % — AB (ref 36.0–46.0)
Hemoglobin: 16.9 g/dL — ABNORMAL HIGH (ref 12.0–15.0)
MCH: 33.2 pg (ref 26.0–34.0)
MCHC: 35.1 g/dL (ref 30.0–36.0)
MCV: 94.7 fL (ref 78.0–100.0)
Platelets: 126 10*3/uL — ABNORMAL LOW (ref 150–400)
RBC: 5.09 MIL/uL (ref 3.87–5.11)
RDW: 13.5 % (ref 11.5–15.5)
WBC: 6.8 10*3/uL (ref 4.0–10.5)

## 2014-05-18 LAB — CREATININE, SERUM
Creatinine, Ser: 0.5 mg/dL (ref 0.50–1.10)
GFR calc non Af Amer: >90 mL/min (ref >90)

## 2014-05-18 LAB — PROTIME-INR
INR: 0.93 (ref 0.00–1.49)
PROTHROMBIN TIME: 12.5 s (ref 11.6–15.2)

## 2014-05-18 LAB — TROPONIN I: Troponin I: <0.3 ng/mL (ref <0.30)

## 2014-05-18 LAB — APTT: aPTT: 28 seconds (ref 24–37)

## 2014-05-18 MED ORDER — ASPIRIN 325 MG PO TABS
325.0000 mg | ORAL_TABLET | Freq: Every day | ORAL | Status: DC
  Administered 2014-05-18 – 2014-05-19 (×2): 325 mg via ORAL
  Filled 2014-05-18 (×2): qty 1

## 2014-05-18 MED ORDER — ADULT MULTIVITAMIN W/MINERALS CH
1.0000 | ORAL_TABLET | Freq: Every day | ORAL | Status: DC
  Administered 2014-05-18 – 2014-05-19 (×2): 1 via ORAL
  Filled 2014-05-18 (×2): qty 1

## 2014-05-18 MED ORDER — LISINOPRIL 20 MG PO TABS
20.0000 mg | ORAL_TABLET | Freq: Every day | ORAL | Status: DC
  Administered 2014-05-18 – 2014-05-19 (×2): 20 mg via ORAL
  Filled 2014-05-18 (×2): qty 1

## 2014-05-18 MED ORDER — LORAZEPAM 1 MG PO TABS: 1.0000 mg | ORAL_TABLET | Freq: Four times a day (QID) | ORAL | Status: DC | PRN

## 2014-05-18 MED ORDER — LABETALOL HCL 5 MG/ML IV SOLN: 10.0000 mg | INTRAVENOUS | Status: DC | PRN

## 2014-05-18 MED ORDER — SODIUM CHLORIDE 0.9 % IV SOLN
INTRAVENOUS | Status: DC
  Administered 2014-05-18: 12:00:00 via INTRAVENOUS

## 2014-05-18 MED ORDER — CARVEDILOL 12.5 MG PO TABS
12.5000 mg | ORAL_TABLET | Freq: Two times a day (BID) | ORAL | Status: DC
  Administered 2014-05-18 – 2014-05-19 (×3): 12.5 mg via ORAL
  Filled 2014-05-18 (×3): qty 1

## 2014-05-18 MED ORDER — ATORVASTATIN CALCIUM 10 MG PO TABS
20.0000 mg | ORAL_TABLET | Freq: Every day | ORAL | Status: DC
  Administered 2014-05-18 – 2014-05-19 (×2): 20 mg via ORAL
  Filled 2014-05-18 (×2): qty 2

## 2014-05-18 MED ORDER — LORAZEPAM 2 MG/ML IJ SOLN: 1.0000 mg | Freq: Four times a day (QID) | INTRAMUSCULAR | Status: DC | PRN

## 2014-05-18 MED ORDER — NICOTINE 7 MG/24HR TD PT24
7.0000 mg | MEDICATED_PATCH | Freq: Every day | TRANSDERMAL | Status: DC
Start: 2014-05-18 — End: 2014-05-19
  Administered 2014-05-18: 7 mg via TRANSDERMAL
  Filled 2014-05-18 (×2): qty 1

## 2014-05-18 MED ORDER — ENOXAPARIN SODIUM 40 MG/0.4ML ~~LOC~~ SOLN
40.0000 mg | SUBCUTANEOUS | Status: DC
  Administered 2014-05-18 – 2014-05-19 (×2): 40 mg via SUBCUTANEOUS
  Filled 2014-05-18 (×2): qty 0.4

## 2014-05-18 MED ORDER — VITAMIN B-1 100 MG PO TABS
100.0000 mg | ORAL_TABLET | Freq: Every day | ORAL | Status: DC
  Administered 2014-05-18 – 2014-05-19 (×2): 100 mg via ORAL
  Filled 2014-05-18 (×2): qty 1

## 2014-05-18 MED ORDER — SODIUM CHLORIDE 0.9 % IV SOLN
INTRAVENOUS | Status: DC
  Administered 2014-05-18: 18:00:00 via INTRAVENOUS

## 2014-05-18 MED ORDER — FOLIC ACID 1 MG PO TABS
1.0000 mg | ORAL_TABLET | Freq: Every day | ORAL | Status: DC
  Administered 2014-05-18 – 2014-05-19 (×2): 1 mg via ORAL
  Filled 2014-05-18 (×2): qty 1

## 2014-05-18 MED ORDER — THIAMINE HCL 100 MG/ML IJ SOLN: 100.0000 mg | Freq: Every day | INTRAMUSCULAR | Status: DC

## 2014-05-18 MED ORDER — STROKE: EARLY STAGES OF RECOVERY BOOK
Freq: Once | Status: AC
  Administered 2014-05-18: 1
  Filled 2014-05-18: qty 1

## 2014-05-18 NOTE — ED Notes (Signed)
Patient transported to CT 

## 2014-05-18 NOTE — ED Notes (Signed)
To ED from home vis [facility], c/o itching and tingling in face which resolved, also reports numbness in left hand which also resolved, EMS reports no neuro deficits, gait trouble this AM, is out of anti-hypertensive meds, A/O, 178/118, 100, 16

## 2014-05-18 NOTE — Progress Notes (Signed)
Report received from ED by charge nurse and pt arrived to the floor at 1635. Pt A&O x4; pt oriented to the room and floor; IV intact and infusing; placed on telemetry and called in to [facility]; VSS taken; pain assessed; pt in bed with call light within reach and family at bedside. Will continue to monitor quietly. Francis Gaines Chaselyn Nanney RN.

## 2014-05-18 NOTE — ED Provider Notes (Signed)
CSN: [id number]     Arrival date & time 05/18/14  0354 History   First MD Initiated Contact with Patient 05/18/14 1011     Chief Complaint  Patient presents with  . Tingling     (Consider location/radiation/quality/duration/timing/severity/associated sxs/prior Treatment) HPI  Patient states couple days ago she had episode of dizziness and thought she was going to pass out. Patient has had a Medtronic pacemaker/defibrillator placed after she was noted to have a slow heart rate at night per patient. She reports she last got her prescription filled for losartan the beginning of December and has been without her blood pressure medication since that 30 day supply ran out she states last night about 10:30 PM while she was getting ready to go to bed she noted some tingling of her right lower lip, she also states the fingers of both hands up to her MCP joints were tingling and she had pain in her left forearm. She states she took 2 baby aspirin and went to sleep. When she woke up this morning she states her right lower lip is still tingling. About 9:00 her fingertips started tingling again bilaterally. She denies any headaches, chest pain, shortness of breath, or neck pain. She denies any visual changes, nausea, or vomiting.  She states after she called EMS she noted she was having difficulty walking stating her right leg seemed weak. She states her right hand felt shaky. Patient states she is left-handed.   PCP none Cardiology Dr Johnsie Cancel, defibrillator Dr Lovena Le  Past Medical History  Diagnosis Date  . HYPERLIPIDEMIA-MIXED 02/07/2009  . HYPERTENSION, BENIGN 08/22/2009  . CAD (coronary artery disease)     a. Inf-post MI 2008 s/p BMS to large marginal of Cx.   Marland Kitchen VENTRICULAR TACHYCARDIA 05/21/2010  . SYSTOLIC HEART FAILURE, CHRONIC 02/07/2009    a. EF 30% 2010, 29% by MRI 08/2012. b. s/p prophylactic Medtronic ICD implantation 10/2012.  . Tobacco abuse   . PVD (peripheral vascular disease)     a.  Evaluated by Dr. [name] Anon 08/2012.   Past Surgical History  Procedure Laterality Date  . Cardiac defibrillator placement     No family history on file. History  Substance Use Topics  . Smoking status: Current Every Day Smoker  . Smokeless tobacco: Not on file  . Alcohol Use: No   Lives at home On disability b/o defibrillator Smokes 1/2 ppd Drinks 16 ounces of beer daily or every other day  OB History   Grav Para Term Preterm Abortions TAB SAB Ect Mult Living                 Review of Systems  All other systems reviewed and are negative.     Allergies  Clopidogrel bisulfate  Home Medications   Prior to Admission medications   Medication Sig Start Date End Date Taking? Authorizing Provider  acetaminophen (TYLENOL) 500 MG tablet Take 500 mg by mouth every 6 (six) hours as needed. For headache   Yes Historical Provider, MD  aspirin 81 MG tablet Take 81 mg by mouth daily.   Yes Historical Provider, MD  carvedilol (COREG) 12.5 MG tablet Take 12.5 mg by mouth 2 (two) times daily with a meal.   Yes Historical Provider, MD  nitroGLYCERIN (NITROSTAT) 0.4 MG SL tablet Place 1 tablet (0.4 mg total) under the tongue every 5 (five) minutes as needed for chest pain (up to 3 doses). 10/22/12  Yes Dayna N Dunn, PA-C   BP 161/101  Pulse 89  Temp(Src)  98.4 F (36.9 C) (Oral)  Resp 20  SpO2 96%  Vital signs normal except hypertension  Physical Exam  Nursing note and vitals reviewed. Constitutional: She is oriented to person, place, and time. She appears well-developed and well-nourished.  Non-toxic appearance. She does not appear ill. No distress.  HENT:  Head: Normocephalic and atraumatic.  Right Ear: External ear normal.  Left Ear: External ear normal.  Nose: Nose normal. No mucosal edema or rhinorrhea.  Mouth/Throat: Oropharynx is clear and moist and mucous membranes are normal. No dental abscesses or uvula swelling.  Edentulous  Eyes: Conjunctivae and EOM are normal. Pupils  are equal, round, and reactive to light.  Neck: Normal range of motion and full passive range of motion without pain. Neck supple.  Cardiovascular: Normal rate, regular rhythm and normal heart sounds.  Exam reveals no gallop and no friction rub.   No murmur heard. Pulmonary/Chest: Effort normal and breath sounds normal. No respiratory distress. She has no wheezes. She has no rhonchi. She has no rales. She exhibits no tenderness and no crepitus.  Abdominal: Soft. Normal appearance and bowel sounds are normal. She exhibits no distension. There is no tenderness. There is no rebound and no guarding.  Musculoskeletal: Normal range of motion. She exhibits no edema and no tenderness.  Moves all extremities well.   Neurological: She is alert and oriented to person, place, and time. She has normal strength. No cranial nerve deficit.  Patient has no facial asymmetry. Patient has no pronator drift. Her grips are equal bilaterally. Her heel-to-shin is normal bilaterally. Her finger to nose is equal bilaterally and intact. She can straight leg raise without difficulty with both lower legs.  Skin: Skin is warm, dry and intact. No rash noted. No erythema. No pallor.  Psychiatric: She has a normal mood and affect. Her speech is normal and behavior is normal. Her mood appears not anxious.    ED Course  Procedures (including critical care time)  Medications  0.9 %  sodium chloride infusion ( Intravenous New Bag/Given 05/18/14 1147)   14:12 Dr Aram Beecham, neurology, feels patient should be admitted, repeat CT scan in am and do TIA/stroke work-up. He will consult.   14:44 Dr Coralyn Pear, admit to team 10, tele  Labs Review Results for orders placed during the hospital encounter of 05/18/14  CBC WITH DIFFERENTIAL      Result Value Ref Range   WBC 5.8  4.0 - 10.5 K/uL   RBC 5.36 (*) 3.87 - 5.11 MIL/uL   Hemoglobin 17.8 (*) 12.0 - 15.0 g/dL   HCT 51.5 (*) 36.0 - 46.0 %   MCV 96.1  78.0 - 100.0 fL   MCH 33.2  26.0 -  34.0 pg   MCHC 34.6  30.0 - 36.0 g/dL   RDW 13.7  11.5 - 15.5 %   Platelets 131 (*) 150 - 400 K/uL   Neutrophils Relative % 42 (*) 43 - 77 %   Neutro Abs 2.5  1.7 - 7.7 K/uL   Lymphocytes Relative 41  12 - 46 %   Lymphs Abs 2.3  0.7 - 4.0 K/uL   Monocytes Relative 11  3 - 12 %   Monocytes Absolute 0.6  0.1 - 1.0 K/uL   Eosinophils Relative 5  0 - 5 %   Eosinophils Absolute 0.3  0.0 - 0.7 K/uL   Basophils Relative 1  0 - 1 %   Basophils Absolute 0.0  0.0 - 0.1 K/uL  COMPREHENSIVE METABOLIC PANEL  Result Value Ref Range   Sodium 141  137 - 147 mEq/L   Potassium 4.7  3.7 - 5.3 mEq/L   Chloride 104  96 - 112 mEq/L   CO2 22  19 - 32 mEq/L   Glucose, Bld 96  70 - 99 mg/dL   BUN 6  6 - 23 mg/dL   Creatinine, Ser 0.46 (*) 0.50 - 1.10 mg/dL   Calcium 9.3  8.4 - 10.5 mg/dL   Total Protein 7.7  6.0 - 8.3 g/dL   Albumin 3.7  3.5 - 5.2 g/dL   AST 50 (*) 0 - 37 U/L   ALT 23  0 - 35 U/L   Alkaline Phosphatase 97  39 - 117 U/L   Total Bilirubin 0.6  0.3 - 1.2 mg/dL   GFR calc non Af Amer >90  >90 mL/min   GFR calc Af Amer >90  >90 mL/min   Anion gap 15  5 - 15  TROPONIN I      Result Value Ref Range   Troponin I <0.30  <0.30 ng/mL  APTT      Result Value Ref Range   aPTT 28  24 - 37 seconds  PROTIME-INR      Result Value Ref Range   Prothrombin Time 12.5  11.6 - 15.2 seconds   INR 0.93  0.00 - 1.49   Laboratory interpretation all normal except significantly elevated Hb     Imaging Review Ct Head Wo Contrast  05/18/2014   CLINICAL DATA:  Left hand numbness an unsteady gait  EXAM: CT HEAD WITHOUT CONTRAST  TECHNIQUE: Contiguous axial images were obtained from the base of the skull through the vertex without intravenous contrast.  COMPARISON:  11/15/2009  FINDINGS: The bony calvarium is intact. No gross soft tissue abnormality is. The ventricles and cortical sulci are within normal limits. Very mild chronic white matter ischemic change is seen. No findings to suggest acute  hemorrhage, acute infarction or space-occupying mass lesion are noted.  IMPRESSION: Mild chronic changes without acute abnormality.   Electronically Signed   By: Inez Catalina M.D.   On: 05/18/2014 13:09    CT head IMPRESSION: Mild chronic changes without acute abnormality.    EKG Interpretation   Date/Time:  Wednesday May 18 2014 09:56:14 EDT Ventricular Rate:  88 PR Interval:  164 QRS Duration: 110 QT Interval:  399 QTC Calculation: 483 R Axis:   10 Text Interpretation:  Sinus rhythm Probable left atrial enlargement Left  ventricular hypertrophy Probable anterior infarct, old No significant  change since last tracing 22 Oct 2012 Confirmed by [facility]  MD-I, Lequan Dobratz  ([id number]) on 05/18/2014 10:08:59 AM      MDM   patient presents with several risk factors for possible stroke, she has untreated hypertension, coronary artery disease, congestive heart failure with a SMAC her. She unfortunately is not able to have MRI because of her pacemaker. She is being admitted for further evaluation for possible stroke.    Final diagnoses:  Right facial numbness  Right leg weakness  Polycythemia    Plan admission   Rolland Porter, MD, Alanson Aly, MD 05/18/14 [phone]

## 2014-05-18 NOTE — Care Management (Signed)
ED CM reviewed record patient being admitted Unit CM and SW will continue follow patient.

## 2014-05-18 NOTE — H&P (Signed)
[facility] History and Physical  [name] HWK:[id number] DOB: [birth year] DOA: 05/18/2014  Referring physician:  PCP: Jenkins Rouge, MD   Chief Complaint: Right lower extremity weakness  HPI: [name] is a 58 y.o. female with a past medical history of coronary artery disease status post myocardial function in 2008, history of peripheral nodular densities, hypertension, tobacco abuse, chronic systolic congestive heart failure undergoing Medtronic single-chamber ICD implantation on 10/21/2012, presenting to the emergency room with complaints of right lower extremity weakness and right facial numbness. She reports that she had been doing well up until symptoms started yesterday, describing by lateral hand numbness and tingling. She took aspirin overnight, this morning woke up with transient weakness to her right lower extremity along with right facial numbness. She called EMS and was brought to the emergency department. She did not have focal neurological deficits on initial evaluation. She denies confusion, seizure activity, changes in vision, falls, nausea, vomiting, shortness of breath or chest pain. In the emergency room she reports feeling better without any more neurological deficits. a CT scan of the brain did not reveal acute intracranial abnormality. She reports compliance with all of her medications with the exception of Cozaar which she is having trouble affording.                                                                                                                                                                                                                                               Review of Systems:  Constitutional:  No weight loss, night sweats, Fevers, chills, fatigue.  HEENT:  No headaches, Difficulty swallowing,Tooth/dental problems,Sore throat,  No sneezing, itching, ear ache, nasal congestion, post nasal drip,  Cardio-vascular:  No chest pain,  Orthopnea, PND, swelling in lower extremities, anasarca, dizziness, palpitations  GI:  No heartburn, indigestion, abdominal pain, nausea, vomiting, diarrhea, change in bowel habits, loss of appetite  Resp:  No shortness of breath with exertion or at rest. No excess mucus, no productive cough, No non-productive cough, No coughing up of blood.No change in color of mucus.No wheezing.No chest wall deformity  Skin:  no rash or lesions.  GU:  no dysuria, change in color of urine, no urgency or frequency. No flank pain.  Musculoskeletal:  No joint pain or swelling. No decreased range of motion. No back pain.  Psych:  No change in  mood or affect. No depression or anxiety. No memory loss.   Past Medical History  Diagnosis Date  . HYPERLIPIDEMIA-MIXED 02/07/2009  . HYPERTENSION, BENIGN 08/22/2009  . CAD (coronary artery disease)     a. Inf-post MI 2008 s/p BMS to large marginal of Cx.   Marland Kitchen VENTRICULAR TACHYCARDIA 05/21/2010  . SYSTOLIC HEART FAILURE, CHRONIC 02/07/2009    a. EF 30% 2010, 29% by MRI 08/2012. b. s/p prophylactic Medtronic ICD implantation 10/2012.  . Tobacco abuse   . PVD (peripheral vascular disease)     a. Evaluated by Dr. [name] Anon 08/2012.   Past Surgical History  Procedure Laterality Date  . Cardiac defibrillator placement     Social History:  reports that she has been smoking.  She does not have any smokeless tobacco history on file. She reports that she does not drink alcohol or use illicit drugs.  Allergies  Allergen Reactions  . Clopidogrel Bisulfate     REACTION: Rash    No family history on file.   Prior to Admission medications   Medication Sig Start Date End Date Taking? Authorizing Provider  acetaminophen (TYLENOL) 500 MG tablet Take 500 mg by mouth every 6 (six) hours as needed. For headache   Yes Historical Provider, MD  aspirin 81 MG tablet Take 81 mg by mouth daily.   Yes Historical Provider, MD  carvedilol (COREG) 12.5 MG tablet Take 12.5 mg by mouth 2 (two)  times daily with a meal.   Yes Historical Provider, MD  nitroGLYCERIN (NITROSTAT) 0.4 MG SL tablet Place 1 tablet (0.4 mg total) under the tongue every 5 (five) minutes as needed for chest pain (up to 3 doses). 10/22/12  Yes Charlie Pitter, PA-C   Physical Exam: Filed Vitals:   05/18/14 1201 05/18/14 1220 05/18/14 1350 05/18/14 1420  BP: 145/88 145/88 155/93   Pulse: 76 69 80   Temp:    98 F (36.7 C)  TempSrc:      Resp:  16 22   SpO2: 97% 97% 99%     Wt Readings from Last 3 Encounters:  11/11/13 69.4 kg (153 lb)  01/29/13 73.301 kg (161 lb 9.6 oz)  11/09/12 75.297 kg (166 lb)    General:  Appears calm and comfortable, no acute distress awake and alert Eyes: PERRL, normal lids, irises & conjunctiva ENT: grossly normal hearing, lips & tongue Neck: no LAD, masses or thyromegaly Cardiovascular: RRR, no m/r/g. No LE edema. Telemetry: SR, no arrhythmias  Respiratory: CTA bilaterally, no w/r/r. Normal respiratory effort. Abdomen: soft, ntnd Skin: no rash or induration seen on limited exam Musculoskeletal: grossly normal tone BUE/BLE Psychiatric: grossly normal mood and affect, speech fluent and appropriate Neurologic: grossly non-focal. Negative for facial droop, slurred speech, alteration to sensation. Speech intact. Has global 5 of 5 muscle strength to bilateral upper and lower extremities without alteration to sensation. Intact finger to nose heel-to-shin.           Labs on Admission:  Basic Metabolic Panel:  Recent Labs Lab 05/18/14 1130  NA 141  K 4.7  CL 104  CO2 22  GLUCOSE 96  BUN 6  CREATININE 0.46*  CALCIUM 9.3   Liver Function Tests:  Recent Labs Lab 05/18/14 1130  AST 50*  ALT 23  ALKPHOS 97  BILITOT 0.6  PROT 7.7  ALBUMIN 3.7   No results found for this basename: LIPASE, AMYLASE,  in the last 168 hours No results found for this basename: AMMONIA,  in the last 168 hours  CBC:  Recent Labs Lab 05/18/14 1130  WBC 5.8  NEUTROABS 2.5  HGB 17.8*    HCT 51.5*  MCV 96.1  PLT 131*   Cardiac Enzymes:  Recent Labs Lab 05/18/14 1130  TROPONINI <0.30    BNP (last 3 results) No results found for this basename: PROBNP,  in the last 8760 hours CBG: No results found for this basename: GLUCAP,  in the last 168 hours  Radiological Exams on Admission: Ct Head Wo Contrast  05/18/2014   CLINICAL DATA:  Left hand numbness an unsteady gait  EXAM: CT HEAD WITHOUT CONTRAST  TECHNIQUE: Contiguous axial images were obtained from the base of the skull through the vertex without intravenous contrast.  COMPARISON:  11/15/2009  FINDINGS: The bony calvarium is intact. No gross soft tissue abnormality is. The ventricles and cortical sulci are within normal limits. Very mild chronic white matter ischemic change is seen. No findings to suggest acute hemorrhage, acute infarction or space-occupying mass lesion are noted.  IMPRESSION: Mild chronic changes without acute abnormality.   Electronically Signed   By: Inez Catalina M.D.   On: 05/18/2014 13:09    EKG: Independently reviewed. Sinus rhythm with LVH  Assessment/Plan Principal Problem:   TIA (transient ischemic attack) Active Problems:   Weakness of right lower extremity   HYPERLIPIDEMIA-MIXED   HYPERTENSION, BENIGN   CARDIOMYOPATHY, PRIMARY, DILATED   Automatic implantable cardioverter-defibrillator in situ   1. Possible transient ischemic attack. Patient with a past medical history of tobacco abuse and poorly controlled hypertension, presenting to the emergency room with complaints of right lower extremity weakness and right facial numbness. Symptoms resolving by the time she reached the emergency room. A CT scan of brain without contrast did not reveal acute intracranial abnormalities. Will place patient on 24-hour observation, plan to repeat CT scan of the brain in a.m. as pacemaker precludes utilization of MRI. Will continue aspirin 325 mg by mouth daily, place patient on TIA protocol.  2. Chronic  systolic congestive heart failure. Patient having her last transthoracic echocardiogram performed in 2011 which showed ejection fraction of 35%. She currently appears compensated, will repeat transthoracic echocardiogram during this hospitalization. She underwent ICD placement in 2014 3. Hypertension. Patient presenting with poorly controlled hypertension mainly due to to her not taking her Cozaar for financial reasons. Will start lisinopril 20 mg by mouth daily in addition to her Coreg 12.5 mg by mouth twice a day. Monitor blood pressures, antihypertensive agents may require titrating. 4. Dyslipidemia. Will check a fasting lipid panel. Start statin therapy 5. DVT prophylaxis.   Code Status: Full Code Family Communication: Spoke with her daughter who was present at bedside Disposition Plan: Will place in 24 hour obs, do not anticipate her requiring greater than 2 nights hospitalization  Time spent: 55 min  Kelvin Cellar [facility] Pager [phone]  **Disclaimer: This note may have been dictated with voice recognition software. Similar sounding words can inadvertently be transcribed and this note may contain transcription errors which may not have been corrected upon publication of note.**

## 2014-05-18 NOTE — Consult Note (Signed)
Referring Physician: Coralyn Pear    Chief Complaint: bilateral facial tingling and bilateral finger tingling  HPI:                                                                                                                                         [name] is an 58 y.o. female chronic systolic congestive heart failure undergoing Medtronic single-chamber ICD implantation on 10/21/2012, presenting to the emergency room with complaints of bilateral facial tingling and bilateral finger tip tingling. She reports that she had been doing well up until symptoms started yesterday evening when she noted her BP was up and her jaw felt funny and then she noted both hands were tingling.  She went to sleep but upon waking she had the same symptoms.  She states she takes ASA daily. She called EMS and was brought to the emergency department. She did not have focal neurological deficits on initial evaluation in ED or when consulted on.    BP while in ED 161/101, 144/120, 153/139   Date last known well: Date: 05/16/2014 Time last known well: Unable to determine tPA Given: No: out of window  Past Medical History  Diagnosis Date  . HYPERLIPIDEMIA-MIXED 02/07/2009  . HYPERTENSION, BENIGN 08/22/2009  . CAD (coronary artery disease)     a. Inf-post MI 2008 s/p BMS to large marginal of Cx.   Marland Kitchen VENTRICULAR TACHYCARDIA 05/21/2010  . SYSTOLIC HEART FAILURE, CHRONIC 02/07/2009    a. EF 30% 2010, 29% by MRI 08/2012. b. s/p prophylactic Medtronic ICD implantation 10/2012.  . Tobacco abuse   . PVD (peripheral vascular disease)     a. Evaluated by Dr. [name] Anon 08/2012.    Past Surgical History  Procedure Laterality Date  . Cardiac defibrillator placement      No family history on file. Social History:  reports that she has been smoking.  She does not have any smokeless tobacco history on file. She reports that she does not drink alcohol or use illicit drugs.  Allergies:  Allergies  Allergen Reactions  .  Clopidogrel Bisulfate     REACTION: Rash    Medications:                                                                                                                           Current Facility-Administered Medications  Medication Dose Route Frequency Provider Last  Rate Last Dose  . 0.9 %  sodium chloride infusion   Intravenous Continuous Eloisa Norrie, MD 100 mL/hr at 05/18/14 1147     Current Outpatient Prescriptions  Medication Sig Dispense Refill  . acetaminophen (TYLENOL) 500 MG tablet Take 500 mg by mouth every 6 (six) hours as needed. For headache      . aspirin 81 MG tablet Take 81 mg by mouth daily.      . carvedilol (COREG) 12.5 MG tablet Take 12.5 mg by mouth 2 (two) times daily with a meal.      . nitroGLYCERIN (NITROSTAT) 0.4 MG SL tablet Place 1 tablet (0.4 mg total) under the tongue every 5 (five) minutes as needed for chest pain (up to 3 doses).  25 tablet  4     ROS:                                                                                                                                       History obtained from the patient  General ROS: negative for - chills, fatigue, fever, night sweats, weight gain or weight loss Psychological ROS: negative for - behavioral disorder, hallucinations, memory difficulties, mood swings or suicidal ideation Ophthalmic ROS: negative for - blurry vision, double vision, eye pain or loss of vision ENT ROS: negative for - epistaxis, nasal discharge, oral lesions, sore throat, tinnitus or vertigo Allergy and Immunology ROS: negative for - hives or itchy/watery eyes Hematological and Lymphatic ROS: negative for - bleeding problems, bruising or swollen lymph nodes Endocrine ROS: negative for - galactorrhea, hair pattern changes, polydipsia/polyuria or temperature intolerance Respiratory ROS: negative for - cough, hemoptysis, shortness of breath or wheezing Cardiovascular ROS: negative for - chest pain, dyspnea on exertion, edema or  irregular heartbeat Gastrointestinal ROS: negative for - abdominal pain, diarrhea, hematemesis, nausea/vomiting or stool incontinence Genito-Urinary ROS: negative for - dysuria, hematuria, incontinence or urinary frequency/urgency Musculoskeletal ROS: negative for - joint swelling or muscular weakness Neurological ROS: as noted in HPI Dermatological ROS: negative for rash and skin lesion changes  Neurologic Examination:                                                                                                      Blood pressure 153/139, pulse 82, temperature 98 F (36.7 C), temperature source Oral, resp. rate 25, SpO2 97.00%.   General: NAD Mental Status: Alert, oriented, thought content appropriate.  Speech fluent without evidence of aphasia.  Able to follow 3 step commands  without difficulty. Cranial Nerves: II: Discs flat bilaterally; Visual fields grossly normal, pupils equal, round, reactive to light and accommodation III,IV, VI: ptosis not present, extra-ocular motions intact bilaterally V,VII: smile symmetric, facial light touch sensation normal bilaterally VIII: hearing normal bilaterally IX,X: gag reflex present XI: bilateral shoulder shrug XII: midline tongue extension without atrophy or fasciculations  Motor: Right : Upper extremity   5/5    Left:     Upper extremity   5/5  Lower extremity   5/5     Lower extremity   5/5 Tone and bulk:normal tone throughout; no atrophy noted Sensory: Pinprick and light touch intact throughout, bilaterally Deep Tendon Reflexes:  Right: Upper Extremity   Left: Upper extremity   biceps (C-5 to C-6) 2/4   biceps (C-5 to C-6) 2/4 tricep (C7) 2/4    triceps (C7) 2/4 Brachioradialis (C6) 2/4  Brachioradialis (C6) 2/4  Lower Extremity Lower Extremity  quadriceps (L-2 to L-4) 2/4   quadriceps (L-2 to L-4) 2/4 Achilles (S1) 2/4   Achilles (S1) 2/4  Plantars: Right: downgoing   Left: downgoing Cerebellar: normal finger-to-nose,   normal heel-to-shin test Gait: not tested CV: pulses palpable throughout    Lab Results: Basic Metabolic Panel:  Recent Labs Lab 05/18/14 1130  NA 141  K 4.7  CL 104  CO2 22  GLUCOSE 96  BUN 6  CREATININE 0.46*  CALCIUM 9.3    Liver Function Tests:  Recent Labs Lab 05/18/14 1130  AST 50*  ALT 23  ALKPHOS 97  BILITOT 0.6  PROT 7.7  ALBUMIN 3.7   No results found for this basename: LIPASE, AMYLASE,  in the last 168 hours No results found for this basename: AMMONIA,  in the last 168 hours  CBC:  Recent Labs Lab 05/18/14 1130  WBC 5.8  NEUTROABS 2.5  HGB 17.8*  HCT 51.5*  MCV 96.1  PLT 131*    Cardiac Enzymes:  Recent Labs Lab 05/18/14 1130  TROPONINI <0.30    Lipid Panel: No results found for this basename: CHOL, TRIG, HDL, CHOLHDL, VLDL, LDLCALC,  in the last 168 hours  CBG: No results found for this basename: GLUCAP,  in the last 168 hours  Microbiology: Results for orders placed during the hospital encounter of 10/21/12  SURGICAL PCR SCREEN     Status: None   Collection Time    10/21/12  6:33 AM      Result Value Ref Range Status   MRSA, PCR NEGATIVE  NEGATIVE Final   Staphylococcus aureus NEGATIVE  NEGATIVE Final   Comment:            The Xpert SA Assay (FDA     approved for NASAL specimens     in patients over 61 years of age),     is one component of     a comprehensive surveillance     program.  Test performance has     been validated by [employer] for patients greater     than or equal to 4 year old.     It is not intended     to diagnose infection nor to     guide or monitor treatment.    Coagulation Studies:  Recent Labs  05/18/14 1130  LABPROT 12.5  INR 0.93    Imaging: Ct Head Wo Contrast  05/18/2014   CLINICAL DATA:  Left hand numbness an unsteady gait  EXAM: CT HEAD WITHOUT CONTRAST  TECHNIQUE: Contiguous axial images were  obtained from the base of the skull through the vertex without intravenous  contrast.  COMPARISON:  11/15/2009  FINDINGS: The bony calvarium is intact. No gross soft tissue abnormality is. The ventricles and cortical sulci are within normal limits. Very mild chronic white matter ischemic change is seen. No findings to suggest acute hemorrhage, acute infarction or space-occupying mass lesion are noted.  IMPRESSION: Mild chronic changes without acute abnormality.   Electronically Signed   By: Inez Catalina M.D.   On: 05/18/2014 13:09       Assessment and plan discussed with with attending physician and they are in agreement.    Etta Quill PA-C [facility] [phone]  05/18/2014, 3:08 PM   Assessment: 58 y.o. female with new onset of bilateral jaw and finger tingling in the setting of hypertensive urgency.  Currently patient is asymptomatic. Neuro exam is non focal.  Patient is unable to have MRI secondary to AICD. Likely symptoms are secondary to elevated BP however cannot exclude CVA in setting of significantly elevated BP.   Stroke Risk Factors - hyperlipidemia and hypertension  Recommend: 1) repeat head CT in AM 2) BP management per primary team 3) Stroke MD to follow in AM.   Patient seen and examined together with physician assistant and I concur with the assessment and plan.  Dorian Pod, MD

## 2014-05-19 ENCOUNTER — Inpatient Hospital Stay (HOSPITAL_COMMUNITY): Payer: Medicare Other

## 2014-05-19 ENCOUNTER — Encounter (HOSPITAL_COMMUNITY): Payer: Self-pay | Admitting: Radiology

## 2014-05-19 DIAGNOSIS — E440 Moderate protein-calorie malnutrition: Secondary | ICD-10-CM | POA: Insufficient documentation

## 2014-05-19 DIAGNOSIS — I5022 Chronic systolic (congestive) heart failure: Secondary | ICD-10-CM

## 2014-05-19 DIAGNOSIS — R29898 Other symptoms and signs involving the musculoskeletal system: Secondary | ICD-10-CM | POA: Diagnosis not present

## 2014-05-19 DIAGNOSIS — E785 Hyperlipidemia, unspecified: Secondary | ICD-10-CM

## 2014-05-19 DIAGNOSIS — F17200 Nicotine dependence, unspecified, uncomplicated: Secondary | ICD-10-CM

## 2014-05-19 DIAGNOSIS — I10 Essential (primary) hypertension: Secondary | ICD-10-CM

## 2014-05-19 DIAGNOSIS — I359 Nonrheumatic aortic valve disorder, unspecified: Secondary | ICD-10-CM

## 2014-05-19 LAB — CBC
HEMATOCRIT: 46.3 % — AB (ref 36.0–46.0)
HEMOGLOBIN: 15.7 g/dL — AB (ref 12.0–15.0)
MCH: 32.2 pg (ref 26.0–34.0)
MCHC: 33.9 g/dL (ref 30.0–36.0)
MCV: 94.9 fL (ref 78.0–100.0)
Platelets: 124 10*3/uL — ABNORMAL LOW (ref 150–400)
RBC: 4.88 MIL/uL (ref 3.87–5.11)
RDW: 13.6 % (ref 11.5–15.5)
WBC: 6.5 10*3/uL (ref 4.0–10.5)

## 2014-05-19 LAB — GLUCOSE, CAPILLARY
GLUCOSE-CAPILLARY: 92 mg/dL (ref 70–99)
GLUCOSE-CAPILLARY: 71 mg/dL (ref 70–99)
Glucose-Capillary: 87 mg/dL (ref 70–99)

## 2014-05-19 LAB — BASIC METABOLIC PANEL
ANION GAP: 11 (ref 5–15)
BUN: 5 mg/dL — ABNORMAL LOW (ref 6–23)
CHLORIDE: 106 meq/L (ref 96–112)
CO2: 25 meq/L (ref 19–32)
Calcium: 8.7 mg/dL (ref 8.4–10.5)
Creatinine, Ser: 0.52 mg/dL (ref 0.50–1.10)
GFR calc Af Amer: >90 mL/min (ref >90)
GFR calc non Af Amer: >90 mL/min (ref >90)
Glucose, Bld: 92 mg/dL (ref 70–99)
Potassium: 4.3 mEq/L (ref 3.7–5.3)
SODIUM: 142 meq/L (ref 137–147)

## 2014-05-19 LAB — LIPID PANEL
Cholesterol: 136 mg/dL (ref 0–200)
HDL: 65 mg/dL (ref >39)
LDL CALC: 60 mg/dL (ref 0–99)
Total CHOL/HDL Ratio: 2.1 RATIO
Triglycerides: 57 mg/dL (ref <150)
VLDL: 11 mg/dL (ref 0–40)

## 2014-05-19 LAB — HEMOGLOBIN A1C
HEMOGLOBIN A1C: 5.4 % (ref <5.7)
Mean Plasma Glucose: 108 mg/dL (ref <117)

## 2014-05-19 MED ORDER — ATORVASTATIN CALCIUM 10 MG PO TABS: 10.0000 mg | ORAL_TABLET | Freq: Every day | ORAL | Status: DC

## 2014-05-19 MED ORDER — ASPIRIN 325 MG PO TABS
325.0000 mg | ORAL_TABLET | Freq: Every day | ORAL | Status: DC
Start: 2014-05-19 — End: 2015-02-27

## 2014-05-19 MED ORDER — NICOTINE 7 MG/24HR TD PT24: 7.0000 mg | MEDICATED_PATCH | Freq: Every day | TRANSDERMAL | Status: DC

## 2014-05-19 MED ORDER — ENSURE COMPLETE PO LIQD: 237.0000 mL | ORAL | Status: DC

## 2014-05-19 MED ORDER — LISINOPRIL 20 MG PO TABS: 20.0000 mg | ORAL_TABLET | Freq: Every day | ORAL | Status: DC

## 2014-05-19 MED ORDER — ENSURE COMPLETE PO LIQD
237.0000 mL | ORAL | Status: DC
  Administered 2014-05-19: 237 mL via ORAL

## 2014-05-19 NOTE — Consult Note (Signed)
NEURO HOSPITALIST PROGRESS NOTE   SUBJECTIVE:                                                                                                                        Stated that the numbness of her face and hands had resolved. Offers no new neurological complains. Repeat CT brain earlier today was unremarkable. Cholesterol 136, Triglycerides 57, HDL 65, LDL 60 BP better control. On aspirin 325 mg daily.  OBJECTIVE:                                                                                                                           Vital signs in last 24 hours: Temp:  [97.7 F (36.5 C)-98.4 F (36.9 C)] 98.4 F (36.9 C) (09/09 2200) Pulse Rate:  [65-89] 67 (09/10 0400) Resp:  [16-25] 16 (09/10 0400) BP: (122-164)/(63-139) 122/71 mmHg (09/10 0400) SpO2:  [92 %-100 %] 98 % (09/10 0400)  Intake/Output from previous day: 09/09 0701 - 09/10 0700 In: -  Out: 800 [Urine:800] Intake/Output this shift:   Nutritional status: Cardiac  Past Medical History  Diagnosis Date  . HYPERLIPIDEMIA-MIXED 02/07/2009  . HYPERTENSION, BENIGN 08/22/2009  . CAD (coronary artery disease)     a. Inf-post MI 2008 s/p BMS to large marginal of Cx.   Marland Kitchen VENTRICULAR TACHYCARDIA 05/21/2010  . SYSTOLIC HEART FAILURE, CHRONIC 02/07/2009    a. EF 30% 2010, 29% by MRI 08/2012. b. s/p prophylactic Medtronic ICD implantation 10/2012.  . Tobacco abuse   . PVD (peripheral vascular disease)     a. Evaluated by Dr. [name] Anon 08/2012.  Marland Kitchen Automatic implantable cardioverter-defibrillator in situ   . TIA (transient ischemic attack) 05/18/2014   Neurologic Exam:  Mental Status:  Alert, oriented, thought content appropriate. Speech fluent without evidence of aphasia. Able to follow 3 step commands without difficulty.  Cranial Nerves:  II: Discs flat bilaterally; Visual fields grossly normal, pupils equal, round, reactive to light and accommodation  III,IV, VI: ptosis not present,  extra-ocular motions intact bilaterally  V,VII: smile symmetric, facial light touch sensation normal bilaterally  VIII: hearing normal bilaterally  IX,X: gag reflex present  XI: bilateral shoulder shrug  XII: midline tongue extension without atrophy or fasciculations  Motor:  Right :  Upper extremity 5/5 Left: Upper extremity 5/5  Lower extremity 5/5 Lower extremity 5/5  Tone and bulk:normal tone throughout; no atrophy noted  Sensory: Pinprick and light touch intact throughout, bilaterally  Deep Tendon Reflexes:  Right: Upper Extremity Left: Upper extremity  biceps (C-5 to C-6) 2/4 biceps (C-5 to C-6) 2/4  tricep (C7) 2/4 triceps (C7) 2/4  Brachioradialis (C6) 2/4 Brachioradialis (C6) 2/4  Lower Extremity Lower Extremity  quadriceps (L-2 to L-4) 2/4 quadriceps (L-2 to L-4) 2/4  Achilles (S1) 2/4 Achilles (S1) 2/4  Plantars:  Right: downgoing Left: downgoing  Cerebellar:  normal finger-to-nose, normal heel-to-shin test  Gait: no ataxia   Lab Results: Lab Results  Component Value Date/Time   CHOL 136 05/19/2014  6:01 AM   Lipid Panel  Recent Labs  05/19/14 0601  CHOL 136  TRIG 57  HDL 65  CHOLHDL 2.1  VLDL 11  LDLCALC 60    Studies/Results: Ct Head Wo Contrast  05/19/2014   CLINICAL DATA:  Follow-up TIA  EXAM: CT HEAD WITHOUT CONTRAST  TECHNIQUE: Contiguous axial images were obtained from the base of the skull through the vertex without intravenous contrast.  COMPARISON:  05/18/2014  FINDINGS: No evidence of parenchymal hemorrhage or extra-axial fluid collection. No mass lesion, mass effect, or midline shift.  No CT evidence of acute infarction.  Subcortical white matter and periventricular small vessel ischemic changes. Intracranial atherosclerosis.  Cerebral volume is within normal limits.  No ventriculomegaly.  The visualized paranasal sinuses are essentially clear. The mastoid air cells are unopacified.  No evidence of calvarial fracture.  IMPRESSION: No evidence of  acute intracranial abnormality.  Small vessel ischemic changes with intracranial atherosclerosis.   Electronically Signed   By: Julian Hy M.D.   On: 05/19/2014 02:27   Ct Head Wo Contrast  05/18/2014   CLINICAL DATA:  Left hand numbness an unsteady gait  EXAM: CT HEAD WITHOUT CONTRAST  TECHNIQUE: Contiguous axial images were obtained from the base of the skull through the vertex without intravenous contrast.  COMPARISON:  11/15/2009  FINDINGS: The bony calvarium is intact. No gross soft tissue abnormality is. The ventricles and cortical sulci are within normal limits. Very mild chronic white matter ischemic change is seen. No findings to suggest acute hemorrhage, acute infarction or space-occupying mass lesion are noted.  IMPRESSION: Mild chronic changes without acute abnormality.   Electronically Signed   By: Inez Catalina M.D.   On: 05/18/2014 13:09    MEDICATIONS                                                                                                                        Scheduled: . aspirin  325 mg Oral Daily  . atorvastatin  20 mg Oral q1800  . carvedilol  12.5 mg Oral BID WC  . enoxaparin (LOVENOX) injection  40 mg Subcutaneous Q24H  . folic acid  1 mg Oral Daily  . lisinopril  20 mg Oral Daily  . multivitamin with minerals  1 tablet Oral Daily  . nicotine  7 mg Transdermal QHS  . thiamine  100 mg Oral Daily   Or  . thiamine  100 mg Intravenous Daily    ASSESSMENT/PLAN:                                                                                                           58 y.o. female with new onset of bilateral jaw and finger tingling in the setting of hypertensive urgency. Currently patient is asymptomatic. Neuro exam is non focal. Patient is unable to have MRI secondary to AICD. Likely symptoms are secondary to elevated BP, as the pattern of her symptoms do not follow a definitive vascular distribution. From our standpoint, patient can go home today, continue  aspirin, and follow up with her PCP. Will sign off.    Dorian Pod, MD [facility] [phone]  05/19/2014, 8:42 AM

## 2014-05-19 NOTE — Progress Notes (Signed)
Utilization review completed.  

## 2014-05-19 NOTE — Progress Notes (Signed)
VASCULAR LAB PRELIMINARY  PRELIMINARY  PRELIMINARY  PRELIMINARY  Carotid Dopplers completed.    Preliminary report:  1-39% ICA stenosis.  Vertebral artery flow is antegrade.   Caterina Racine, RVT 05/19/2014, 3:48 PM

## 2014-05-19 NOTE — Discharge Summary (Signed)
Physician Discharge Summary  [name] YYQ:[id number] DOB: 01-Nov-1955 DOA: 05/18/2014  PCP: No PCP Per Patient  Admit date: 05/18/2014 Discharge date: 05/19/2014  Time spent: 45 minutes  Recommendations for Outpatient Follow-up:  Patient will be discharged to home. She is to follow up with her primary care physician within one week of discharge. Patient should continue her medications as prescribed. Patient should resume normal physical activity as tolerated and she should follow a heart healthy diet with 1556mL fluid restriction.  Patient is also to follow up with Dr. Crissie Sickles, cardiologist.  Discharge Diagnoses:  Right lower extremity weakness, possible TIA Chronic systolic congestive heart failure Hypertension  Dyslipidemia Moderate malnutrition Tobacco abuse  Discharge Condition: Stable  Diet recommendation: heart healthy  There were no vitals filed for this visit.  History of present illness:  On 05/18/2014 [name] is a 58 y.o. female with a past medical history of coronary artery disease status post myocardial function in 2008, history of peripheral nodular densities, hypertension, tobacco abuse, chronic systolic congestive heart failure undergoing [employer] single-chamber ICD implantation on 10/21/2012, presenting to the emergency room with complaints of right lower extremity weakness and right facial numbness. She reports that she had been doing well up until symptoms started yesterday, describing by lateral hand numbness and tingling. She took aspirin overnight, this morning woke up with transient weakness to her right lower extremity along with right facial numbness. She called EMS and was brought to the emergency department. She did not have focal neurological deficits on initial evaluation. She denies confusion, seizure activity, changes in vision, falls, nausea, vomiting, shortness of breath or chest pain. In the emergency room she reports feeling better without  any more neurological deficits. a CT scan of the brain did not reveal acute intracranial abnormality. She reports compliance with all of her medications with the exception of Cozaar which she is having trouble affording.   Hospital Course:  Right lower extremity weakness, possible TIA -Patient symptoms have resolved upon admission, currently she has no other symptoms. -CT of the head:Mild chronic changes without acute abnormality. -Repeat CT of the head:No evidence of acute intracranial abnormality. -MRI of the brain could not be conducted as patient had an ICD placed. -Echocardiogram: EF 88%, grade 1 diastolic dysfunction -Carotid Doppler: 1-39% ICA stenosis. Vertebral artery flow is antegrade.  -LDL 60, hemoglobin A1c 5.4 -Continue full dose aspirin  Chronic systolic congestive heart failure -Echocardiogram in 2011: EF of 35%. -Echocardiogram on 05/19/2014 results listed below -Patient appears compensated. -has ICD placed -Spoke with Dr. Lovena Le regarding changes in EF.  Patient only has one lead (ventricular) so ICD cannot be interrogated for atrial fibrillation.  Patient will need to follow up with Dr. Lovena Le.   -Patient was given instructions regarding medication compliance and smoking cessation, as well as fluid restriction.   Hypertension -Poorly controlled, patient admits to not having her medications due to financial reason -Will continue patient on Coreg, lisinopril -Patient will need to see her primary care physician for titration of these medications  Dyslipidemia -Lipid panel: Total cholesterol 136, triglycerides 57, HDL 65, LDL 60 -Lipid panel at goal, no statin needed at this time.  Moderate malnutrition -Nutrition was consulted and appreciated -Continue feeding supplementation with Ensure  Tobacco abuse -Smoking cessation counseling given -Continue nicotine patch  Procedures: Echocardiogram Study Conclusions  - Left ventricle: The cavity size was mildly  dilated. Wall thickness was normal. The estimated ejection fraction was 15%. Diffuse hypokinesis. There is akinesis of the anteroseptal, lateral,  inferolateral, and apical myocardium. Doppler parameters are consistent with abnormal left ventricular relaxation (grade 1 diastolic dysfunction). - Aortic valve: There was mild regurgitation. - Mitral valve: There was mild regurgitation. - Left atrium: The atrium was mildly dilated. - Pulmonary arteries: Systolic pressure was mildly increased. PA peak pressure: 39 mm Hg (S). Impressions: Severe LV dysfunction with wall motion abnormalities as outlined; grade 1 diastolic dysfunction; mild LAE; mild MR, TR and AI; mildly elevated pulmonary pressures.  Carotid doppler 1-39% ICA stenosis. Vertebral artery flow is antegrade.   Consultations: Neurology Cardiology, Dr. Lovena Le, via phone  Discharge Exam: Filed Vitals:   05/19/14 1708  BP: 131/80  Pulse: 68  Temp:   Resp:      General: Well developed, well nourished, NAD, appears stated age  HEENT: NCAT, PERRLA, EOMI, Anicteic Sclera, mucous membranes moist.  Cardiovascular: S1 S2 auscultated, no rubs, murmurs or gallops. Regular rate and rhythm.  Respiratory: Clear to auscultation bilaterally with equal chest rise  Abdomen: Soft, nontender, nondistended, + bowel sounds  Extremities: warm dry without cyanosis clubbing or edema  Neuro: AAOx3, cranial nerves grossly intact. Strength 5/5 in patient's upper and lower extremities bilaterally  Skin: Without rashes exudates or nodules  Psych: Normal affect and demeanor with intact judgement and insight  Discharge Instructions      Discharge Instructions   Discharge instructions    Complete by:  As directed   Patient will be discharged to home. She is to follow up with her primary care physician within one week of discharge. Patient should continue her medications as prescribed. Patient should resume normal physical activity as tolerated and  she should follow a heart healthy diet with 1573mL fluid restriction.  Patient is also to follow up with Dr. Crissie Sickles, cardiologist.            Medication List         acetaminophen 500 MG tablet  Commonly known as:  TYLENOL  Take 500 mg by mouth every 6 (six) hours as needed. For headache     aspirin 325 MG tablet  Take 1 tablet (325 mg total) by mouth daily.     atorvastatin 10 MG tablet  Commonly known as:  LIPITOR  Take 1 tablet (10 mg total) by mouth daily at 6 PM.     carvedilol 12.5 MG tablet  Commonly known as:  COREG  Take 12.5 mg by mouth 2 (two) times daily with a meal.     feeding supplement (ENSURE COMPLETE) Liqd  Take 237 mLs by mouth daily.     lisinopril 20 MG tablet  Commonly known as:  PRINIVIL,ZESTRIL  Take 1 tablet (20 mg total) by mouth daily.     nicotine 7 mg/24hr patch  Commonly known as:  NICODERM CQ - dosed in mg/24 hr  Place 1 patch (7 mg total) onto the skin at bedtime.     nitroGLYCERIN 0.4 MG SL tablet  Commonly known as:  NITROSTAT  Place 1 tablet (0.4 mg total) under the tongue every 5 (five) minutes as needed for chest pain (up to 3 doses).       Allergies  Allergen Reactions  . Clopidogrel Bisulfate     REACTION: Rash   Follow-up Information   Follow up with Primary Care physician. Schedule an appointment as soon as possible for a visit in 1 week. [facility] followup)       Schedule an appointment as soon as possible for a visit with Cristopher Peru, MD. (Follow up appointment)  Specialty:  Cardiology   Contact information:   [address] [place] [address] [phone]        The results of significant diagnostics from this hospitalization (including imaging, microbiology, ancillary and laboratory) are listed below for reference.    Significant Diagnostic Studies: Ct Head Wo Contrast  05/19/2014   CLINICAL DATA:  Follow-up TIA  EXAM: CT HEAD WITHOUT CONTRAST  TECHNIQUE: Contiguous axial images  were obtained from the base of the skull through the vertex without intravenous contrast.  COMPARISON:  05/18/2014  FINDINGS: No evidence of parenchymal hemorrhage or extra-axial fluid collection. No mass lesion, mass effect, or midline shift.  No CT evidence of acute infarction.  Subcortical white matter and periventricular small vessel ischemic changes. Intracranial atherosclerosis.  Cerebral volume is within normal limits.  No ventriculomegaly.  The visualized paranasal sinuses are essentially clear. The mastoid air cells are unopacified.  No evidence of calvarial fracture.  IMPRESSION: No evidence of acute intracranial abnormality.  Small vessel ischemic changes with intracranial atherosclerosis.   Electronically Signed   By: Julian Hy M.D.   On: 05/19/2014 02:27   Ct Head Wo Contrast  05/18/2014   CLINICAL DATA:  Left hand numbness an unsteady gait  EXAM: CT HEAD WITHOUT CONTRAST  TECHNIQUE: Contiguous axial images were obtained from the base of the skull through the vertex without intravenous contrast.  COMPARISON:  11/15/2009  FINDINGS: The bony calvarium is intact. No gross soft tissue abnormality is. The ventricles and cortical sulci are within normal limits. Very mild chronic white matter ischemic change is seen. No findings to suggest acute hemorrhage, acute infarction or space-occupying mass lesion are noted.  IMPRESSION: Mild chronic changes without acute abnormality.   Electronically Signed   By: Inez Catalina M.D.   On: 05/18/2014 13:09    Microbiology: No results found for this or any previous visit (from the past 240 hour(s)).   Labs: Basic Metabolic Panel:  Recent Labs Lab 05/18/14 1130 05/18/14 1834 05/19/14 0601  NA 141  --  142  K 4.7  --  4.3  CL 104  --  106  CO2 22  --  25  GLUCOSE 96  --  92  BUN 6  --  5*  CREATININE 0.46* 0.50 0.52  CALCIUM 9.3  --  8.7   Liver Function Tests:  Recent Labs Lab 05/18/14 1130  AST 50*  ALT 23  ALKPHOS 97  BILITOT 0.6    PROT 7.7  ALBUMIN 3.7   No results found for this basename: LIPASE, AMYLASE,  in the last 168 hours No results found for this basename: AMMONIA,  in the last 168 hours CBC:  Recent Labs Lab 05/18/14 1130 05/18/14 1834 05/19/14 0601  WBC 5.8 6.8 6.5  NEUTROABS 2.5  --   --   HGB 17.8* 16.9* 15.7*  HCT 51.5* 48.2* 46.3*  MCV 96.1 94.7 94.9  PLT 131* 126* 124*   Cardiac Enzymes:  Recent Labs Lab 05/18/14 1130  TROPONINI <0.30   BNP: BNP (last 3 results) No results found for this basename: PROBNP,  in the last 8760 hours CBG:  Recent Labs Lab 05/19/14 0639 05/19/14 1135 05/19/14 1635  GLUCAP 87 92 71    Signed:  Emmanuela Ghazi  [facility] 05/19/2014, 5:28 PM

## 2014-05-19 NOTE — Progress Notes (Signed)
Pt A&O x4; pt discharge education and instructions completed with pt at bedside and she voices understanding denying any questions. Pt IV and telemetry removed; pt provided education handout on Stroke and heart failure; pt transported off unit via wheelchair with family and belongings at side. Francis Gaines Yesha Muchow RN.

## 2014-05-19 NOTE — Discharge Instructions (Signed)
Heart Failure °Heart failure is a condition in which the heart has trouble pumping blood. This means your heart does not pump blood efficiently for your body to work well. In some cases of heart failure, fluid may back up into your lungs or you may have swelling (edema) in your lower legs. Heart failure is usually a long-term (chronic) condition. It is important for you to take good care of yourself and follow your health care provider's treatment plan. °CAUSES  °Some health conditions can cause heart failure. Those health conditions include: °· High blood pressure (hypertension). Hypertension causes the heart muscle to work harder than normal. When pressure in the blood vessels is high, the heart needs to pump (contract) with more force in order to circulate blood throughout the body. High blood pressure eventually causes the heart to become stiff and weak. °· Coronary artery disease (CAD). CAD is the buildup of cholesterol and fat (plaque) in the arteries of the heart. The blockage in the arteries deprives the heart muscle of oxygen and blood. This can cause chest pain and may lead to a heart attack. High blood pressure can also contribute to CAD. °· Heart attack (myocardial infarction). A heart attack occurs when one or more arteries in the heart become blocked. The loss of oxygen damages the muscle tissue of the heart. When this happens, part of the heart muscle dies. The injured tissue does not contract as well and weakens the heart's ability to pump blood. °· Abnormal heart valves. When the heart valves do not open and close properly, it can cause heart failure. This makes the heart muscle pump harder to keep the blood flowing. °· Heart muscle disease (cardiomyopathy or myocarditis). Heart muscle disease is damage to the heart muscle from a variety of causes. These can include drug or alcohol abuse, infections, or unknown reasons. These can increase the risk of heart failure. °· Lung disease. Lung disease  makes the heart work harder because the lungs do not work properly. This can cause a strain on the heart, leading it to fail. °· Diabetes. Diabetes increases the risk of heart failure. High blood sugar contributes to high fat (lipid) levels in the blood. Diabetes can also cause slow damage to tiny blood vessels that carry important nutrients to the heart muscle. When the heart does not get enough oxygen and food, it can cause the heart to become weak and stiff. This leads to a heart that does not contract efficiently. °· Other conditions can contribute to heart failure. These include abnormal heart rhythms, thyroid problems, and low blood counts (anemia). °Certain unhealthy behaviors can increase the risk of heart failure, including: °· Being overweight. °· Smoking or chewing tobacco. °· Eating foods high in fat and cholesterol. °· Abusing illicit drugs or alcohol. °· Lacking physical activity. °SYMPTOMS  °Heart failure symptoms may vary and can be hard to detect. Symptoms may include: °· Shortness of breath with activity, such as climbing stairs. °· Persistent cough. °· Swelling of the feet, ankles, legs, or abdomen. °· Unexplained weight gain. °· Difficulty breathing when lying flat (orthopnea). °· Waking from sleep because of the need to sit up and get more air. °· Rapid heartbeat. °· Fatigue and loss of energy. °· Feeling light-headed, dizzy, or close to fainting. °· Loss of appetite. °· Nausea. °· Increased urination during the night (nocturia). °DIAGNOSIS  °A diagnosis of heart failure is based on your history, symptoms, physical examination, and diagnostic tests. Diagnostic tests for heart failure may include: °·   Echocardiography. °· Electrocardiography. °· Chest X-ray. °· Blood tests. °· Exercise stress test. °· Cardiac angiography. °· Radionuclide scans. °TREATMENT  °Treatment is aimed at managing the symptoms of heart failure. Medicines, behavioral changes, or surgical intervention may be necessary to  treat heart failure. °· Medicines to help treat heart failure may include: °¨ Angiotensin-converting enzyme (ACE) inhibitors. This type of medicine blocks the effects of a blood protein called angiotensin-converting enzyme. ACE inhibitors relax (dilate) the blood vessels and help lower blood pressure. °¨ Angiotensin receptor blockers (ARBs). This type of medicine blocks the actions of a blood protein called angiotensin. Angiotensin receptor blockers dilate the blood vessels and help lower blood pressure. °¨ Water pills (diuretics). Diuretics cause the kidneys to remove salt and water from the blood. The extra fluid is removed through urination. This loss of extra fluid lowers the volume of blood the heart pumps. °¨ Beta blockers. These prevent the heart from beating too fast and improve heart muscle strength. °¨ Digitalis. This increases the force of the heartbeat. °· Healthy behavior changes include: °¨ Obtaining and maintaining a healthy weight. °¨ Stopping smoking or chewing tobacco. °¨ Eating heart-healthy foods. °¨ Limiting or avoiding alcohol. °¨ Stopping illicit drug use. °¨ Physical activity as directed by your health care provider. °· Surgical treatment for heart failure may include: °¨ A procedure to open blocked arteries, repair damaged heart valves, or remove damaged heart muscle tissue. °¨ A pacemaker to improve heart muscle function and control certain abnormal heart rhythms. °¨ An internal cardioverter defibrillator to treat certain serious abnormal heart rhythms. °¨ A left ventricular assist device (LVAD) to assist the pumping ability of the heart. °HOME CARE INSTRUCTIONS  °· Take medicines only as directed by your health care provider. Medicines are important in reducing the workload of your heart, slowing the progression of heart failure, and improving your symptoms. °¨ Do not stop taking your medicine unless directed by your health care provider. °¨ Do not skip any dose of medicine. °¨ Refill your  prescriptions before you run out of medicine. Your medicines are needed every day. °· Engage in moderate physical activity if directed by your health care provider. Moderate physical activity can benefit some people. The elderly and people with severe heart failure should consult with a health care provider for physical activity recommendations. °· Eat heart-healthy foods. Food choices should be free of trans fat and low in saturated fat, cholesterol, and salt (sodium). Healthy choices include fresh or frozen fruits and vegetables, fish, lean meats, legumes, fat-free or low-fat dairy products, and whole grain or high fiber foods. Talk to a dietitian to learn more about heart-healthy foods. °· Limit sodium if directed by your health care provider. Sodium restriction may reduce symptoms of heart failure in some people. Talk to a dietitian to learn more about heart-healthy seasonings. °· Use healthy cooking methods. Healthy cooking methods include roasting, grilling, broiling, baking, poaching, steaming, or stir-frying. Talk to a dietitian to learn more about healthy cooking methods. °· Limit fluids if directed by your health care provider. Fluid restriction may reduce symptoms of heart failure in some people. °· Weigh yourself every day. Daily weights are important in the early recognition of excess fluid. You should weigh yourself every morning after you urinate and before you eat breakfast. Wear the same amount of clothing each time you weigh yourself. Record your daily weight. Provide your health care provider with your weight record. °· Monitor and record your blood pressure if directed by your health care   provider.  Check your pulse if directed by your health care provider.  Lose weight if directed by your health care provider. Weight loss may reduce symptoms of heart failure in some people.  Stop smoking or chewing tobacco. Nicotine makes your heart work harder by causing your blood vessels to constrict.  Do not use nicotine gum or patches before talking to your health care provider.  Keep all follow-up visits as directed by your health care provider. This is important.  Limit alcohol intake to no more than 1 drink per day for nonpregnant women and 2 drinks per day for men. One drink equals 12 ounces of beer, 5 ounces of wine, or 1 ounces of hard liquor. Drinking more than that is harmful to your heart. Tell your health care provider if you drink alcohol several times a week. Talk with your health care provider about whether alcohol is safe for you. If your heart has already been damaged by alcohol or you have severe heart failure, drinking alcohol should be stopped completely.  Stop illicit drug use.  Stay up-to-date with immunizations. It is especially important to prevent respiratory infections through current pneumococcal and influenza immunizations.  Manage other health conditions such as hypertension, diabetes, thyroid disease, or abnormal heart rhythms as directed by your health care provider.  Learn to manage stress.  Plan rest periods when fatigued.  Learn strategies to manage high temperatures. If the weather is extremely hot:  Avoid vigorous physical activity.  Use air conditioning or fans or seek a cooler location.  Avoid caffeine and alcohol.  Wear loose-fitting, lightweight, and light-colored clothing.  Learn strategies to manage cold temperatures. If the weather is extremely cold:  Avoid vigorous physical activity.  Layer clothes.  Wear mittens or gloves, a hat, and a scarf when going outside.  Avoid alcohol.  Obtain ongoing education and support as needed.  Participate in or seek rehabilitation as needed to maintain or improve independence and quality of life. SEEK MEDICAL CARE IF:   Your weight increases by 03 lb/1.4 kg in 1 day or 05 lb/2.3 kg in a week.  You have increasing shortness of breath that is unusual for you.  You are unable to participate in  your usual physical activities.  You tire easily.  You cough more than normal, especially with physical activity.  You have any or more swelling in areas such as your hands, feet, ankles, or abdomen.  You are unable to sleep because it is hard to breathe.  You feel like your heart is beating fast (palpitations).  You become dizzy or light-headed upon standing up. SEEK IMMEDIATE MEDICAL CARE IF:   You have difficulty breathing.  There is a change in mental status such as decreased alertness or difficulty with concentration.  You have a pain or discomfort in your chest.  You have an episode of fainting (syncope). MAKE SURE YOU:   Understand these instructions.  Will watch your condition.  Will get help right away if you are not doing well or get worse. Document Released: 08/26/2005 Document Revised: 01/10/2014 Document Reviewed: 09/25/2012 [facility] Patient Information 2015 [place], [place]. This information is not intended to replace advice given to you by your health care provider. Make sure you discuss any questions you have with your health care provider. Transient Ischemic Attack A transient ischemic attack (TIA) is a "warning stroke" that causes stroke-like symptoms. A TIA does not cause lasting damage to the brain. It is important to know when to get help and what  to do to prevent stroke or death.  HOME CARE   Take all medicines exactly as told by your doctor. Understand all your medicine instructions.  You may need to take aspirin or warfarin medicine. Take warfarin exactly as told.  Taking too much or too little warfarin is dangerous. Blood tests must be done as often as told by your doctor. These blood tests help your doctor make sure the amount of warfarin you are taking is right. A PT blood test measures how long it takes for blood to clot. Your PT is used to calculate another value called an INR. Your PT and INR help your doctor adjust your warfarin dosage.  Food can  cause problems with warfarin and affect the results of your blood tests. This is true for foods high in vitamin K. Spinach, kale, broccoli, cabbage, collard and turnip greens, Brussels sprouts, peas, cauliflower, seaweed, and parsley are high in vitamin K as well as beef and pork liver, green tea, and soybean oil. Eat the same amount of food high in vitamin K. Avoid major changes in your diet. Tell your doctor before changing your diet. Talk to a food specialist (dietitian) if you have questions.  Many medicines can cause problems with warfarin and affect your PT and INR. Tell your doctor about all medicines you take. This includes vitamins and dietary pills (supplements). Be careful with aspirin and medicines that relieve redness, soreness, and puffiness (inflammation). Do not take or stop medicines unless your doctor tells you to.  Warfarin can cause a lot of bruising or bleeding. Hold pressure over cuts for longer than normal. Talk to your doctor about other side effects of warfarin.  Avoid sports or activities that may cause injury or bleeding.  Be careful when you shave, floss your teeth, or use sharp objects.  Avoid alcoholic drinks or drink very little alcohol while taking warfarin. Tell your doctor if you change how much alcohol you drink.  Tell your dentist and other doctors that you take warfarin before procedures.  Eat 5 or more servings of fruits and vegetables a day.  Follow your diet program as told, if you are given one.  Keep a healthy weight.  Stay active. Try to get at least 30 minutes of activity on most or all days.  Do not smoke.  Limit how much alcohol you drink even if you are not taking warfarin. Moderate alcohol use is:  No more than 2 drinks each day for men.  No more than 1 drink each day for women who are not pregnant.  Stop abusing drugs.  Keep your home safe so you do not fall. Try:  Putting grab bars in the bedroom and bathroom.  Raising toilet  seats.  Putting a seat in the shower.  Keep all doctor visits a told. GET HELP IF:  Your personality changes.  You have trouble swallowing.  You are seeing two of everything.  You are dizzy.  You have a fever.  Your skin starts to break down. GET HELP RIGHT AWAY IF:  The symptoms below may be a sign of an emergency. Do not wait to see if the symptoms go away. Call for help (911 in [place] Do not drive yourself to the hospital.  You have sudden weakness or numbness on the face, arm, or leg (especially on one side of the body).  You have sudden trouble walking or moving your arms or legs.  You have sudden confusion.  You have trouble talking or understanding.  You have sudden trouble seeing in one or both eyes.  You lose your balance or your movements are not smooth.  You have a sudden, severe headache with no known cause.  You have new chest pain or you feel your heart beating in a unsteady way.  You are partly or totally unaware of what is going on around you. MAKE SURE YOU:   Understand these instructions.  Will watch your condition.  Will get help right away if you are not doing well or get worse. Document Released: 06/04/2008 Document Revised: 01/10/2014 Document Reviewed: 12/01/2013 [facility] Patient Information 2015 [place], [place]. This information is not intended to replace advice given to you by your health care provider. Make sure you discuss any questions you have with your health care provider.

## 2014-05-19 NOTE — Progress Notes (Signed)
INITIAL NUTRITION ASSESSMENT  DOCUMENTATION CODES Per approved criteria  -Non-severe (moderate) malnutrition in the context of chronic illness  Pt meets criteria for MODERATE MALNUTRITION in the context of CHRONIC ILLNESS as evidenced by mild loss of subcutaneous fat and moderate loss of muscle mass.  INTERVENTION: Encourage PO intake with general healthful diet, low in saturated fat Provide Ensure Complete once daily  NUTRITION DIAGNOSIS: Increased nutrient needs related to muscle and fat wasting as evidenced by physical exam.   Goal: Pt to meet >/= 90% of their estimated nutrition needs   Monitor:  PO intake, weight trend, labs  Reason for Assessment: Malnutrition Screening Tool, score of 5  58 y.o. female  Admitting Dx: TIA (transient ischemic attack)  ASSESSMENT: 58 y.o. female with a past medical history of coronary artery disease status post myocardial function in 2008, history of peripheral nodular densities, hypertension, tobacco abuse, chronic systolic congestive heart failure undergoing Medtronic single-chamber ICD implantation on 10/21/2012, presenting to the emergency room with complaints of right lower extremity weakness and right facial numbness.    Pt reports that she has lost 20 lbs in the past few months though she is unsure why. She states her appetite has been good and she is eating well. She states her usual body weight is 153 lbs. Per bed scale pt's weight today is 146 lbs. Pt appears thin with muscle wasting. Encouraged pt to eat 3 balanced meals and one snack daily. Encouraged daily intake of fruits and vegetables. Encouraged decreased intake of fried foods and high fat foods. Encouraged intake of fish and nuts. Suggested pt drink Carnation Instant breakfast once or twice daily if weight loss continues. Pt voices understanding.  Lipid panel WNL. HgbA1C WNL.  Dietary recall:  Breakfast: Sausage, eggs, biscuit Dinner: Fried chicken, cream potatoes or macaroni  and cheese, vegetables Late night snack: Sandwich  Nutrition Focused Physical Exam:  Subcutaneous Fat:  Orbital Region: mild wasting Upper Arm Region: mild wasting Thoracic and Lumbar Region: NA  Muscle:  Temple Region: moderate wasting Clavicle Bone Region: mild wasting Clavicle and Acromion Bone Region: mild wasting Scapular Bone Region: moderate wasting Dorsal Hand: moderate wasting Patellar Region: moderate wasting Anterior Thigh Region: moderate wasting Posterior Calf Region: mild wasting  Edema: none  Height: Ht Readings from Last 1 Encounters:  11/11/13 5' 9.5" (1.765 m)    Weight: Wt Readings from Last 1 Encounters:  11/11/13 153 lb (69.4 kg)  05/19/14 146 lb  Ideal Body Weight: 148 lbs  % Ideal Body Weight: 98%  Wt Readings from Last 10 Encounters:  11/11/13 153 lb (69.4 kg)  01/29/13 161 lb 9.6 oz (73.301 kg)  11/09/12 166 lb (75.297 kg)  10/22/12 160 lb 8 oz (72.802 kg)  10/22/12 160 lb 8 oz (72.802 kg)  10/14/12 163 lb 12.8 oz (74.299 kg)  08/26/12 168 lb 12 oz (76.544 kg)  08/11/12 162 lb (73.483 kg)  05/21/10 175 lb (79.379 kg)  02/15/10 179 lb (81.194 kg)    Usual Body Weight: 153 lb  % Usual Body Weight: 95%  BMI:  Body Mass index of 21.2 kg/(m^2)  Estimated Nutritional Needs: Kcal: 1800-2000 Protein: 85-95 grams Fluid: 1.8- 2 L/day  Skin: intact  Diet Order: Cardiac  EDUCATION NEEDS: -No education needs identified at this time   Intake/Output Summary (Last 24 hours) at 05/19/14 1100 Last data filed at 05/18/14 1900  Gross per 24 hour  Intake      0 ml  Output    800 ml  Net   -  800 ml    Last BM: 9/8   Labs:   Recent Labs Lab 05/18/14 1130 05/18/14 1834 05/19/14 0601  NA 141  --  142  K 4.7  --  4.3  CL 104  --  106  CO2 22  --  25  BUN 6  --  5*  CREATININE 0.46* 0.50 0.52  CALCIUM 9.3  --  8.7  GLUCOSE 96  --  92    CBG (last 3)   Recent Labs  05/19/14 0639  GLUCAP 87    Scheduled Meds: .  aspirin  325 mg Oral Daily  . atorvastatin  20 mg Oral q1800  . carvedilol  12.5 mg Oral BID WC  . enoxaparin (LOVENOX) injection  40 mg Subcutaneous Q24H  . folic acid  1 mg Oral Daily  . lisinopril  20 mg Oral Daily  . multivitamin with minerals  1 tablet Oral Daily  . nicotine  7 mg Transdermal QHS  . thiamine  100 mg Oral Daily   Or  . thiamine  100 mg Intravenous Daily    Continuous Infusions: . sodium chloride 75 mL/hr at 05/18/14 1818    Past Medical History  Diagnosis Date  . HYPERLIPIDEMIA-MIXED 02/07/2009  . HYPERTENSION, BENIGN 08/22/2009  . CAD (coronary artery disease)     a. Inf-post MI 2008 s/p BMS to large marginal of Cx.   Marland Kitchen VENTRICULAR TACHYCARDIA 05/21/2010  . SYSTOLIC HEART FAILURE, CHRONIC 02/07/2009    a. EF 30% 2010, 29% by MRI 08/2012. b. s/p prophylactic Medtronic ICD implantation 10/2012.  . Tobacco abuse   . PVD (peripheral vascular disease)     a. Evaluated by Dr. [name] Anon 08/2012.  Marland Kitchen Automatic implantable cardioverter-defibrillator in situ   . TIA (transient ischemic attack) 05/18/2014    Past Surgical History  Procedure Laterality Date  . Cardiac defibrillator placement  10/2012  . Cystoscopy w/ stone manipulation  1990's  . Cesarean section  1977; [place], LDN Inpatient Clinical Dietitian Pager: [phone] After Hours Pager: [phone]

## 2014-05-19 NOTE — Progress Notes (Signed)
  Echocardiogram 2D Echocardiogram has been performed.  [name] [name] 05/19/2014, 3:29 PM

## 2014-05-20 NOTE — Progress Notes (Signed)
Received consult after patient discharged home for medication assistance; Patient has private insurance with Medicare and secondary insurance with Medicaid - prescription coverage with Medicaid is $3.00 per prescription; B Tamera Punt RN,BSN,MHA

## 2014-06-02 ENCOUNTER — Ambulatory Visit (INDEPENDENT_AMBULATORY_CARE_PROVIDER_SITE_OTHER): Payer: Medicare Other | Admitting: *Deleted

## 2014-06-02 DIAGNOSIS — I5022 Chronic systolic (congestive) heart failure: Secondary | ICD-10-CM

## 2014-06-02 DIAGNOSIS — I428 Other cardiomyopathies: Secondary | ICD-10-CM

## 2014-06-02 LAB — MDC_IDC_ENUM_SESS_TYPE_INCLINIC
Battery Remaining Longevity: 129 mo
Battery Voltage: 3.02 V
Date Time Interrogation Session: 20150924095638
HIGH POWER IMPEDANCE MEASURED VALUE: 72 Ohm
HighPow Impedance: 190 Ohm
Lead Channel Impedance Value: 532 Ohm
Lead Channel Pacing Threshold Pulse Width: 0.4 ms
Lead Channel Sensing Intrinsic Amplitude: 12.75 mV
Lead Channel Setting Pacing Amplitude: 2.5 V
Lead Channel Setting Pacing Pulse Width: 0.4 ms
Lead Channel Setting Sensing Sensitivity: 0.3 mV
MDC IDC MSMT LEADCHNL RV PACING THRESHOLD AMPLITUDE: 0.5 V
MDC IDC SET ZONE DETECTION INTERVAL: 360 ms
MDC IDC STAT BRADY RV PERCENT PACED: 0.09 %
Zone Setting Detection Interval: 290 ms
Zone Setting Detection Interval: 400 ms

## 2014-06-02 NOTE — Progress Notes (Signed)
ICD check in clinic. Normal device function. Thresholds and sensing consistent with previous device measurements. Impedance trends stable over time. No evidence of any ventricular arrhythmias. Histogram distribution appropriate for patient and level of activity. No changes made this session. Device programmed at appropriate safety margins. Device programmed to optimize intrinsic conduction. Estimated longevity 10.7 years. ROV in 09-05-14 @ 930 with device clinic.

## 2014-06-03 ENCOUNTER — Ambulatory Visit (INDEPENDENT_AMBULATORY_CARE_PROVIDER_SITE_OTHER): Payer: Medicare Other | Admitting: Cardiovascular Disease

## 2014-06-03 ENCOUNTER — Encounter: Payer: Self-pay | Admitting: Cardiovascular Disease

## 2014-06-03 VITALS — BP 130/80 | HR 68 | Ht 69.0 in | Wt 149.8 lb

## 2014-06-03 DIAGNOSIS — I428 Other cardiomyopathies: Secondary | ICD-10-CM

## 2014-06-03 DIAGNOSIS — E785 Hyperlipidemia, unspecified: Secondary | ICD-10-CM

## 2014-06-03 DIAGNOSIS — I739 Peripheral vascular disease, unspecified: Secondary | ICD-10-CM

## 2014-06-03 DIAGNOSIS — F17200 Nicotine dependence, unspecified, uncomplicated: Secondary | ICD-10-CM

## 2014-06-03 DIAGNOSIS — I2510 Atherosclerotic heart disease of native coronary artery without angina pectoris: Secondary | ICD-10-CM

## 2014-06-03 DIAGNOSIS — I10 Essential (primary) hypertension: Secondary | ICD-10-CM

## 2014-06-03 NOTE — Assessment & Plan Note (Signed)
Decrease to 14 mg patch next week Doing better Lungs clear

## 2014-06-03 NOTE — Assessment & Plan Note (Signed)
Cholesterol is at goal.  Continue current dose of statin and diet Rx.  No myalgias or side effects.  F/U  LFT's in 6 months. Lab Results  Component Value Date   LDLCALC 60 05/19/2014

## 2014-06-03 NOTE — Assessment & Plan Note (Signed)
Well controlled.  Continue current medications and low sodium Dash type diet.    

## 2014-06-03 NOTE — Assessment & Plan Note (Signed)
No claudication f/u ABI"s 12/15  .77 bilaterally last year

## 2014-06-03 NOTE — Patient Instructions (Signed)
Your physician wants you to follow-up in:  [place] will receive a reminder letter in the mail two months in advance. If you don't receive a letter, please call our office to schedule the follow-up appointment. Your physician recommends that you continue on your current medications as directed. Please refer to the Current Medication list given to you today. Your physician has requested that you have an ankle brachial index (ABI). During this test an ultrasound and blood pressure cuff are used to evaluate the arteries that supply the arms and legs with blood. Allow thirty minutes for this exam. There are no restrictions or special instructions.

## 2014-06-03 NOTE — Progress Notes (Signed)
Patient ID: [name], female   DOB: [birth year], 58 y.o.   MRN: [id number] Previous patient of Dr Olevia Perches Referred by primary as she has not had f/u since he retired History of BMS to circumflex in 2008. EF has been as low as 30-35% but not assessed in the last 3 years. Reviewed cath and she had small diagonal disease as well. Previous LE duplex suggested left SFA occlusion. Plavix apparently causes a rash.  Wearing patch since 05/18/14   Seeing Dr Ouida Sills with [facility] as primary  She has mild exertional dyspnea. No chest pain. Occational flip flops and palpitations. Compliant with meds. No syncope Gets right hip and buttock pain with walking that is likely claudications Not taking meds ran out.  F/U MRI showed inferior scar and EF 29% Seen by Dr Lovena Le and AICD placed 10/21/12   ABI's 12/14 .77 bilaterally      ROS: Denies fever, malais, weight loss, blurry vision, decreased visual acuity, cough, sputum, SOB, hemoptysis, pleuritic pain, palpitaitons, heartburn, abdominal pain, melena, lower extremity edema, claudication, or rash.  All other systems reviewed and negative  General: Affect appropriate Healthy:  appears stated age 4: normal Neck supple with no adenopathy JVP normal no bruits no thyromegaly Lungs clear with no wheezing and good diaphragmatic motion Heart:  S1/S2 no murmur, no rub, gallop or click PMI normal Abdomen: benighn, BS positve, no tenderness, no AAA no bruit.  No HSM or HJR Distal pulses intact with no bruits No edema Neuro non-focal Skin warm and dry patch on right shoulder No muscular weakness   Current Outpatient Prescriptions  Medication Sig Dispense Refill  . acetaminophen (TYLENOL) 500 MG tablet Take 500 mg by mouth every 6 (six) hours as needed. For headache      . aspirin 325 MG tablet Take 1 tablet (325 mg total) by mouth daily.  30 tablet  0  . atorvastatin (LIPITOR) 10 MG tablet Take 1 tablet (10 mg total) by mouth daily at 6 PM.  30 tablet  0  .  carvedilol (COREG) 12.5 MG tablet Take 12.5 mg by mouth 2 (two) times daily with a meal.      . feeding supplement, ENSURE COMPLETE, (ENSURE COMPLETE) LIQD Take 237 mLs by mouth daily.      Marland Kitchen lisinopril (PRINIVIL,ZESTRIL) 20 MG tablet Take 1 tablet (20 mg total) by mouth daily.  30 tablet  0  . nicotine (NICODERM CQ - DOSED IN MG/24 HR) 7 mg/24hr patch Place 1 patch (7 mg total) onto the skin at bedtime.  28 patch  0  . nitroGLYCERIN (NITROSTAT) 0.4 MG SL tablet Place 1 tablet (0.4 mg total) under the tongue every 5 (five) minutes as needed for chest pain (up to 3 doses).  25 tablet  4   No current facility-administered medications for this visit.    Allergies  Clopidogrel bisulfate  Electrocardiogram:  SR LVH old anterior MI   Assessment and Plan

## 2014-06-03 NOTE — Assessment & Plan Note (Signed)
Euvolemic no dyspnea continue current meds

## 2014-06-03 NOTE — Assessment & Plan Note (Signed)
Stable with no angina and good activity level.  Continue medical Rx  

## 2014-06-04 ENCOUNTER — Encounter: Payer: Self-pay | Admitting: Internal Medicine

## 2014-06-09 ENCOUNTER — Ambulatory Visit (HOSPITAL_COMMUNITY): Payer: Medicare Other | Attending: Cardiovascular Disease | Admitting: Cardiology

## 2014-06-09 DIAGNOSIS — I739 Peripheral vascular disease, unspecified: Secondary | ICD-10-CM | POA: Insufficient documentation

## 2014-06-09 DIAGNOSIS — I10 Essential (primary) hypertension: Secondary | ICD-10-CM | POA: Diagnosis not present

## 2014-06-09 DIAGNOSIS — I2510 Atherosclerotic heart disease of native coronary artery without angina pectoris: Secondary | ICD-10-CM | POA: Insufficient documentation

## 2014-06-09 DIAGNOSIS — E785 Hyperlipidemia, unspecified: Secondary | ICD-10-CM | POA: Insufficient documentation

## 2014-06-09 DIAGNOSIS — Z720 Tobacco use: Secondary | ICD-10-CM | POA: Diagnosis not present

## 2014-06-09 NOTE — Progress Notes (Signed)
LEA Doppler/ABI performed 

## 2014-06-16 ENCOUNTER — Encounter: Payer: Self-pay | Admitting: Internal Medicine

## 2014-06-20 ENCOUNTER — Other Ambulatory Visit: Payer: Self-pay | Admitting: *Deleted

## 2014-06-20 MED ORDER — ATORVASTATIN CALCIUM 10 MG PO TABS
10.0000 mg | ORAL_TABLET | Freq: Every day | ORAL | Status: DC
Start: 2014-06-20 — End: 2017-02-24

## 2014-06-20 MED ORDER — CARVEDILOL 12.5 MG PO TABS: 12.5000 mg | ORAL_TABLET | Freq: Two times a day (BID) | ORAL | Status: DC

## 2014-06-20 MED ORDER — LISINOPRIL 20 MG PO TABS: 20.0000 mg | ORAL_TABLET | Freq: Every day | ORAL | Status: DC

## 2014-06-20 MED ORDER — NITROGLYCERIN 0.4 MG SL SUBL: 0.4000 mg | SUBLINGUAL_TABLET | SUBLINGUAL | Status: DC | PRN

## 2014-06-21 ENCOUNTER — Encounter: Payer: Self-pay | Admitting: Cardiovascular Disease

## 2014-06-21 ENCOUNTER — Ambulatory Visit (INDEPENDENT_AMBULATORY_CARE_PROVIDER_SITE_OTHER): Payer: Medicare Other | Admitting: Cardiovascular Disease

## 2014-06-21 VITALS — BP 132/70 | HR 61 | Ht 69.5 in | Wt 146.8 lb

## 2014-06-21 DIAGNOSIS — I5022 Chronic systolic (congestive) heart failure: Secondary | ICD-10-CM

## 2014-06-21 DIAGNOSIS — I2510 Atherosclerotic heart disease of native coronary artery without angina pectoris: Secondary | ICD-10-CM

## 2014-06-21 DIAGNOSIS — I739 Peripheral vascular disease, unspecified: Secondary | ICD-10-CM

## 2014-06-21 NOTE — Assessment & Plan Note (Signed)
She has no symptoms suggestive of angina.

## 2014-06-21 NOTE — Assessment & Plan Note (Signed)
Lab Results  Component Value Date   CHOL 136 05/19/2014   HDL 65 05/19/2014   LDLCALC 60 05/19/2014   TRIG 57 05/19/2014   CHOLHDL 2.1 05/19/2014   Continue treatment with atorvastatin. LDL is at target.

## 2014-06-21 NOTE — Progress Notes (Signed)
Primary cardiologist: Dr. Johnsie Cancel  HPI  This is a 58 year old African American female who is here today for a follow up visit regarding  peripheral arterial disease. The patient has extensive cardiac history including CAD with previous stenting as well as chronic systolic heart failure with severely reduced LV systolic function. cardiac MRI in 2013 showed an ejection fraction of 28%. The patient has an ICD.  She is known to have occluded bilateral SFA for many years . She describes prolonged history of bilateral calf discomfort with moderate walking. She is able to go up one flight of stairs and walk more than a block without having to stop. Recent ABI was 0.79 bilaterally. Claudication although has been stable. She quit smoking recently.  Allergies  Allergen Reactions  . Clopidogrel Bisulfate     REACTION: Rash     Current Outpatient Prescriptions on File Prior to Visit  Medication Sig Dispense Refill  . acetaminophen (TYLENOL) 500 MG tablet Take 500 mg by mouth every 6 (six) hours as needed. For headache      . aspirin 325 MG tablet Take 1 tablet (325 mg total) by mouth daily.  30 tablet  0  . atorvastatin (LIPITOR) 10 MG tablet Take 1 tablet (10 mg total) by mouth daily at 6 PM.  30 tablet  11  . carvedilol (COREG) 12.5 MG tablet Take 1 tablet (12.5 mg total) by mouth 2 (two) times daily with a meal.  60 tablet  11  . feeding supplement, ENSURE COMPLETE, (ENSURE COMPLETE) LIQD Take 237 mLs by mouth daily.      Marland Kitchen lisinopril (PRINIVIL,ZESTRIL) 20 MG tablet Take 1 tablet (20 mg total) by mouth daily.  30 tablet  11  . nicotine (NICODERM CQ - DOSED IN MG/24 HR) 7 mg/24hr patch Place 1 patch (7 mg total) onto the skin at bedtime.  28 patch  0  . nitroGLYCERIN (NITROSTAT) 0.4 MG SL tablet Place 1 tablet (0.4 mg total) under the tongue every 5 (five) minutes as needed for chest pain (up to 3 doses).  25 tablet  4   No current facility-administered medications on file prior to visit.      Past Medical History  Diagnosis Date  . HYPERLIPIDEMIA-MIXED 02/07/2009  . HYPERTENSION, BENIGN 08/22/2009  . CAD (coronary artery disease)     a. Inf-post MI 2008 s/p BMS to large marginal of Cx.   Marland Kitchen VENTRICULAR TACHYCARDIA 05/21/2010  . SYSTOLIC HEART FAILURE, CHRONIC 02/07/2009    a. EF 30% 2010, 29% by MRI 08/2012. b. s/p prophylactic Medtronic ICD implantation 10/2012.  . Tobacco abuse   . PVD (peripheral vascular disease)     a. Evaluated by Dr. [name] Anon 08/2012.  Marland Kitchen Automatic implantable cardioverter-defibrillator in situ   . TIA (transient ischemic attack) 05/18/2014     Past Surgical History  Procedure Laterality Date  . Cardiac defibrillator placement  10/2012  . Cystoscopy w/ stone manipulation  1990's  . Cesarean section  1977; 1982     No family history on file.   History   Social History  . Marital Status: Single    Spouse Name: N/A    Number of Children: N/A  . Years of Education: N/A   Occupational History  . Not on file.   Social History Main Topics  . Smoking status: Current Every Day Smoker -- 0.50 packs/day for 40 years    Types: Cigarettes  . Smokeless tobacco: Never Used  . Alcohol Use: 12.0 oz/week    20 Cans  of beer per week     Comment: 05/18/2014 "2, 40's qod"  . Drug Use: No  . Sexual Activity: Yes   Other Topics Concern  . Not on file   Social History Narrative  . No narrative on file     PHYSICAL EXAM   BP 132/70  Pulse 61  Ht 5' 9.5" (1.765 m)  Wt 146 lb 12.8 oz (66.588 kg)  BMI 21.38 kg/m2 Constitutional: She is oriented to person, place, and time. She appears well-developed and well-nourished. No distress.  HENT: No nasal discharge.  Head: Normocephalic and atraumatic.  Eyes: Pupils are equal and round. Right eye exhibits no discharge. Left eye exhibits no discharge.  Neck: Normal range of motion. Neck supple. No JVD present. No thyromegaly present. No carotid bruits Cardiovascular: Normal rate, regular rhythm with  premature beats, normal heart sounds. Exam reveals no gallop and no friction rub. No murmur heard.  Pulmonary/Chest: Effort normal and breath sounds normal. No stridor. No respiratory distress. She has no wheezes. She has no rales. She exhibits no tenderness.  Abdominal: Soft. Bowel sounds are normal. She exhibits no distension. There is no tenderness. There is no rebound and no guarding.  Musculoskeletal: Normal range of motion. She exhibits no edema and no tenderness.  Neurological: She is alert and oriented to person, place, and time. Coordination normal.  Skin: Skin is warm and dry. No rash noted. She is not diaphoretic. No erythema. No pallor.  Psychiatric: She has a normal mood and affect. Her behavior is normal. Judgment and thought content normal.  Vascular: Femoral pulses slightly diminished on the right side and normal on the left side. Distal pulses are not palpable.    ASSESSMENT AND PLAN

## 2014-06-21 NOTE — Patient Instructions (Signed)
Your physician wants you to follow-up in: 1 YEAR with Dr [name] Anon.  You will receive a reminder letter in the mail two months in advance. If you don't receive a letter, please call our office to schedule the follow-up appointment.  Your physician recommends that you continue on your current medications as directed. Please refer to the Current Medication list given to you today.

## 2014-06-21 NOTE — Assessment & Plan Note (Signed)
She appears to be euvolemic. 

## 2014-06-21 NOTE — Assessment & Plan Note (Addendum)
The patient has mild to moderate bilateral calf claudication due to known chronically occluded SFAs bilaterally. Recent ABI was stable at 0.79 bilaterally. I recommend continuing medical therapy. I congratulated her on smoking cessation. Followup with me on a yearly basis to ensure stability.

## 2014-08-18 ENCOUNTER — Encounter (HOSPITAL_COMMUNITY): Payer: Self-pay | Admitting: Internal Medicine

## 2014-08-25 ENCOUNTER — Other Ambulatory Visit: Payer: Self-pay | Admitting: Internal Medicine

## 2014-08-26 ENCOUNTER — Other Ambulatory Visit: Payer: Self-pay | Admitting: Internal Medicine

## 2014-09-05 ENCOUNTER — Ambulatory Visit (INDEPENDENT_AMBULATORY_CARE_PROVIDER_SITE_OTHER): Payer: Medicare Other | Admitting: *Deleted

## 2014-09-05 DIAGNOSIS — I472 Ventricular tachycardia: Secondary | ICD-10-CM

## 2014-09-05 DIAGNOSIS — I5022 Chronic systolic (congestive) heart failure: Secondary | ICD-10-CM

## 2014-09-05 DIAGNOSIS — I4729 Other ventricular tachycardia: Secondary | ICD-10-CM

## 2014-09-05 LAB — MDC_IDC_ENUM_SESS_TYPE_INCLINIC
Battery Remaining Longevity: 127 mo
Battery Voltage: 3.03 V
Brady Statistic RV Percent Paced: 0.06 %
HIGH POWER IMPEDANCE MEASURED VALUE: 190 Ohm
HighPow Impedance: 81 Ohm
Lead Channel Impedance Value: 532 Ohm
Lead Channel Pacing Threshold Pulse Width: 0.4 ms
Lead Channel Sensing Intrinsic Amplitude: 14 mV
Lead Channel Setting Pacing Amplitude: 2.5 V
Lead Channel Setting Pacing Pulse Width: 0.4 ms
MDC IDC MSMT LEADCHNL RV PACING THRESHOLD AMPLITUDE: 0.5 V
MDC IDC MSMT LEADCHNL RV SENSING INTR AMPL: 11.375 mV
MDC IDC SESS DTM: 20151228101106
MDC IDC SET LEADCHNL RV SENSING SENSITIVITY: 0.3 mV
MDC IDC SET ZONE DETECTION INTERVAL: 400 ms
Zone Setting Detection Interval: 290 ms
Zone Setting Detection Interval: 360 ms

## 2014-09-05 NOTE — Progress Notes (Signed)
ICD check in clinic. Normal device function. Threshold and sensing consistent with previous device measurements. Impedance trends stable over time. No evidence of any ventricular arrhythmias. Stable thoracic impedance---last abn btwn 11/4-11/8. Histogram distribution appropriate for patient and level of activity. No changes made this session. Device programmed at appropriate safety margins. Device programmed to optimize intrinsic conduction. Estimated longevity 10.5 years. Plan to follow up with GT in 3 months. Patient education completed including shock plan. Alert tones demonstrated for patient.

## 2014-09-19 ENCOUNTER — Encounter: Payer: Self-pay | Admitting: Internal Medicine

## 2014-11-29 ENCOUNTER — Encounter: Payer: Self-pay | Admitting: Internal Medicine

## 2014-11-29 ENCOUNTER — Other Ambulatory Visit: Payer: Self-pay

## 2014-11-29 ENCOUNTER — Ambulatory Visit (INDEPENDENT_AMBULATORY_CARE_PROVIDER_SITE_OTHER): Payer: Medicare Other | Admitting: Internal Medicine

## 2014-11-29 VITALS — BP 138/60 | HR 78 | Ht 69.5 in | Wt 136.6 lb

## 2014-11-29 DIAGNOSIS — I472 Ventricular tachycardia: Secondary | ICD-10-CM

## 2014-11-29 DIAGNOSIS — Z95810 Presence of automatic (implantable) cardiac defibrillator: Secondary | ICD-10-CM

## 2014-11-29 DIAGNOSIS — I4729 Other ventricular tachycardia: Secondary | ICD-10-CM

## 2014-11-29 DIAGNOSIS — I5022 Chronic systolic (congestive) heart failure: Secondary | ICD-10-CM

## 2014-11-29 LAB — MDC_IDC_ENUM_SESS_TYPE_INCLINIC
Battery Remaining Longevity: 125 mo
Battery Voltage: 3.02 V
Brady Statistic RV Percent Paced: 0.02 %
Date Time Interrogation Session: 20160322103347
HIGH POWER IMPEDANCE MEASURED VALUE: 78 Ohm
HighPow Impedance: 190 Ohm
Lead Channel Impedance Value: 570 Ohm
Lead Channel Pacing Threshold Amplitude: 0.5 V
Lead Channel Sensing Intrinsic Amplitude: 10 mV
Lead Channel Setting Pacing Amplitude: 2.5 V
Lead Channel Setting Sensing Sensitivity: 0.3 mV
MDC IDC MSMT LEADCHNL RV PACING THRESHOLD PULSEWIDTH: 0.4 ms
MDC IDC MSMT LEADCHNL RV SENSING INTR AMPL: 14.125 mV
MDC IDC SET LEADCHNL RV PACING PULSEWIDTH: 0.4 ms
MDC IDC SET ZONE DETECTION INTERVAL: 290 ms
MDC IDC SET ZONE DETECTION INTERVAL: 400 ms
Zone Setting Detection Interval: 360 ms

## 2014-11-29 NOTE — Progress Notes (Signed)
HPI Ms. Karen Karen returns today for followup. She is a pleasant 59 yo woman with a h/o CAD, VT, chronic systolic heart failure, s/p ICD implant. She has done well in the interim. She denies chest pain, sob, or syncope. No ICD shock. Allergies  Allergen Reactions  . Clopidogrel Bisulfate     REACTION: Rash     Current Outpatient Prescriptions  Medication Sig Dispense Refill  . acetaminophen (TYLENOL) 500 MG tablet Take 500 mg by mouth every 6 (six) hours as needed. For headache    . aspirin 325 MG tablet Take 1 tablet (325 mg total) by mouth daily. 30 tablet 0  . atorvastatin (LIPITOR) 10 MG tablet Take 1 tablet (10 mg total) by mouth daily at 6 PM. 30 tablet 11  . carvedilol (COREG) 12.5 MG tablet Take 1 tablet (12.5 mg total) by mouth 2 (two) times daily with a meal. 60 tablet 11  . feeding supplement, ENSURE COMPLETE, (ENSURE COMPLETE) LIQD Take 237 mLs by mouth daily.    Marland Kitchen lisinopril (PRINIVIL,ZESTRIL) 20 MG tablet Take 1 tablet (20 mg total) by mouth daily. 30 tablet 11  . nicotine (NICODERM CQ - DOSED IN MG/24 HR) 7 mg/24hr patch Place 1 patch (7 mg total) onto the skin at bedtime. 28 patch 0  . nitroGLYCERIN (NITROSTAT) 0.4 MG SL tablet Place 1 tablet (0.4 mg total) under the tongue every 5 (five) minutes as needed for chest pain (up to 3 doses). 25 tablet 4  . PROAIR HFA 108 (90 BASE) MCG/ACT inhaler Inhale 2 puffs into the lungs every 6 (six) hours as needed. Shortness of breath or wheezing     No current facility-administered medications for this visit.     Past Medical History  Diagnosis Date  . HYPERLIPIDEMIA-MIXED 02/07/2009  . HYPERTENSION, BENIGN 08/22/2009  . CAD (coronary artery disease)     a. Inf-post MI 2008 s/p BMS to large marginal of Cx.   Marland Kitchen VENTRICULAR TACHYCARDIA 05/21/2010  . SYSTOLIC HEART FAILURE, CHRONIC 02/07/2009    a. EF 30% 2010, 29% by MRI 08/2012. b. s/p prophylactic Medtronic ICD implantation 10/2012.  . Tobacco abuse   . PVD (peripheral  vascular disease)     a. Evaluated by Dr. [name] [name] 08/2012.  Marland Kitchen Automatic implantable cardioverter-defibrillator in situ   . TIA (transient ischemic attack) 05/18/2014    ROS:   All systems reviewed and negative except as noted in the HPI.   Past Surgical History  Procedure Laterality Date  . Cardiac defibrillator placement  10/2012  . Cystoscopy w/ stone manipulation  1990's  . Cesarean section  1977; 1982  . Implantable cardioverter defibrillator implant N/A 10/21/2012    Procedure: IMPLANTABLE CARDIOVERTER DEFIBRILLATOR IMPLANT;  Surgeon: Karen Lance, MD;  Location: [facility] CATH LAB;  Service: Cardiovascular;  Laterality: N/A;     Family History  Problem Relation Age of Onset  . Cancer Mother     cervical  . Heart attack Father   . Asthma Brother   . Other Brother     Pacemaker     History   Social History  . Marital Status: Single    Spouse Name: N/A  . Number of Children: N/A  . Years of Education: N/A   Occupational History  . Not on file.   Social History Main Topics  . Smoking status: Current Every Day Smoker -- 0.50 packs/day for 40 years    Types: Cigarettes  . Smokeless tobacco: Never Used  . Alcohol Use: 12.0  oz/week    20 Cans of beer per week     Comment: 05/18/2014 "2, 40's qod"  . Drug Use: No  . Sexual Activity: Yes   Other Topics Concern  . Not on file   Social History Narrative     BP 138/60 mmHg  Pulse 78  Ht 5' 9.5" (1.765 m)  Wt 136 lb 9.6 oz (61.961 kg)  BMI 19.89 kg/m2  Physical Exam:  Well appearing middle aged woman, NAD HEENT: Unremarkable Neck: 6 cm JVD, no thyromegally Back:  No CVA tenderness Lungs:  Clear with no wheezes, rales, or rhonchi, well healed ICD incision HEART:  Regular rate rhythm, no murmurs, no rubs, no clicks Abd:  soft, positive bowel sounds, no organomegally, no rebound, no guarding Ext:  2 plus pulses, no edema, no cyanosis, no clubbing Skin:  No rashes no nodules Neuro:  CN II through XII intact, motor  grossly intact  DEVICE  Normal device function.  See PaceArt for details.   Assess/Plan:

## 2014-11-29 NOTE — Assessment & Plan Note (Signed)
Her Medtronic ICD is working normally. Will recheck in several months.

## 2014-11-29 NOTE — Patient Instructions (Addendum)
Your physician recommends that you schedule a follow-up appointment in: 3 months with device clinic   Your physician wants you to follow-up in: 12 months with Dr Taylor You will receive a reminder letter in the mail two months in advance. If you don't receive a letter, please call our office to schedule the follow-up appointment.   

## 2014-11-29 NOTE — Assessment & Plan Note (Signed)
Her VT has been quiet. No change in medications.

## 2014-12-02 ENCOUNTER — Encounter (HOSPITAL_COMMUNITY): Payer: Self-pay | Admitting: Emergency Medicine

## 2014-12-02 ENCOUNTER — Emergency Department (HOSPITAL_COMMUNITY): Payer: Medicare Other

## 2014-12-02 ENCOUNTER — Observation Stay (HOSPITAL_COMMUNITY)
Admission: EM | Admit: 2014-12-02 | Discharge: 2014-12-03 | Disposition: A | Discharge status: Home / Self Care | Payer: Medicare Other | Attending: Internal Medicine | Admitting: Internal Medicine

## 2014-12-02 DIAGNOSIS — Z95810 Presence of automatic (implantable) cardiac defibrillator: Secondary | ICD-10-CM | POA: Insufficient documentation

## 2014-12-02 DIAGNOSIS — R069 Unspecified abnormalities of breathing: Secondary | ICD-10-CM | POA: Diagnosis not present

## 2014-12-02 DIAGNOSIS — D582 Other hemoglobinopathies: Secondary | ICD-10-CM | POA: Diagnosis present

## 2014-12-02 DIAGNOSIS — Z720 Tobacco use: Secondary | ICD-10-CM

## 2014-12-02 DIAGNOSIS — I472 Ventricular tachycardia: Secondary | ICD-10-CM

## 2014-12-02 DIAGNOSIS — I10 Essential (primary) hypertension: Secondary | ICD-10-CM | POA: Diagnosis not present

## 2014-12-02 DIAGNOSIS — R0600 Dyspnea, unspecified: Secondary | ICD-10-CM | POA: Diagnosis present

## 2014-12-02 DIAGNOSIS — Z23 Encounter for immunization: Secondary | ICD-10-CM | POA: Diagnosis not present

## 2014-12-02 DIAGNOSIS — R05 Cough: Secondary | ICD-10-CM | POA: Diagnosis not present

## 2014-12-02 DIAGNOSIS — Z7289 Other problems related to lifestyle: Secondary | ICD-10-CM | POA: Diagnosis present

## 2014-12-02 DIAGNOSIS — J441 Chronic obstructive pulmonary disease with (acute) exacerbation: Secondary | ICD-10-CM | POA: Diagnosis not present

## 2014-12-02 DIAGNOSIS — I4729 Other ventricular tachycardia: Secondary | ICD-10-CM

## 2014-12-02 DIAGNOSIS — Z8673 Personal history of transient ischemic attack (TIA), and cerebral infarction without residual deficits: Secondary | ICD-10-CM | POA: Insufficient documentation

## 2014-12-02 DIAGNOSIS — Z789 Other specified health status: Secondary | ICD-10-CM | POA: Diagnosis present

## 2014-12-02 DIAGNOSIS — J9601 Acute respiratory failure with hypoxia: Secondary | ICD-10-CM | POA: Insufficient documentation

## 2014-12-02 DIAGNOSIS — E785 Hyperlipidemia, unspecified: Secondary | ICD-10-CM | POA: Diagnosis not present

## 2014-12-02 DIAGNOSIS — R062 Wheezing: Secondary | ICD-10-CM

## 2014-12-02 DIAGNOSIS — I5022 Chronic systolic (congestive) heart failure: Secondary | ICD-10-CM

## 2014-12-02 DIAGNOSIS — J209 Acute bronchitis, unspecified: Secondary | ICD-10-CM | POA: Diagnosis not present

## 2014-12-02 DIAGNOSIS — F17210 Nicotine dependence, cigarettes, uncomplicated: Secondary | ICD-10-CM | POA: Diagnosis not present

## 2014-12-02 DIAGNOSIS — I5020 Unspecified systolic (congestive) heart failure: Secondary | ICD-10-CM | POA: Diagnosis present

## 2014-12-02 DIAGNOSIS — Z955 Presence of coronary angioplasty implant and graft: Secondary | ICD-10-CM | POA: Insufficient documentation

## 2014-12-02 DIAGNOSIS — I739 Peripheral vascular disease, unspecified: Secondary | ICD-10-CM | POA: Diagnosis not present

## 2014-12-02 DIAGNOSIS — J440 Chronic obstructive pulmonary disease with acute lower respiratory infection: Principal | ICD-10-CM | POA: Insufficient documentation

## 2014-12-02 DIAGNOSIS — Z7982 Long term (current) use of aspirin: Secondary | ICD-10-CM | POA: Insufficient documentation

## 2014-12-02 DIAGNOSIS — I2510 Atherosclerotic heart disease of native coronary artery without angina pectoris: Secondary | ICD-10-CM | POA: Insufficient documentation

## 2014-12-02 DIAGNOSIS — R0602 Shortness of breath: Secondary | ICD-10-CM | POA: Diagnosis not present

## 2014-12-02 DIAGNOSIS — F17200 Nicotine dependence, unspecified, uncomplicated: Secondary | ICD-10-CM | POA: Diagnosis present

## 2014-12-02 LAB — INFLUENZA PANEL BY PCR (TYPE A & B)
H1N1 flu by pcr: NOT DETECTED
Influenza A By PCR: NEGATIVE
Influenza B By PCR: NEGATIVE

## 2014-12-02 LAB — CBC WITH DIFFERENTIAL/PLATELET
Basophils Absolute: 0 10*3/uL (ref 0.0–0.1)
Basophils Relative: 0 % (ref 0–1)
EOS PCT: 5 % (ref 0–5)
Eosinophils Absolute: 0.3 10*3/uL (ref 0.0–0.7)
HCT: 45.3 % (ref 36.0–46.0)
Hemoglobin: 15.1 g/dL — ABNORMAL HIGH (ref 12.0–15.0)
LYMPHS ABS: 3.5 10*3/uL (ref 0.7–4.0)
LYMPHS PCT: 50 % — AB (ref 12–46)
MCH: 31.7 pg (ref 26.0–34.0)
MCHC: 33.3 g/dL (ref 30.0–36.0)
MCV: 95 fL (ref 78.0–100.0)
Monocytes Absolute: 0.8 10*3/uL (ref 0.1–1.0)
Monocytes Relative: 12 % (ref 3–12)
NEUTROS ABS: 2.3 10*3/uL (ref 1.7–7.7)
Neutrophils Relative %: 33 % — ABNORMAL LOW (ref 43–77)
Platelets: 146 10*3/uL — ABNORMAL LOW (ref 150–400)
RBC: 4.77 MIL/uL (ref 3.87–5.11)
RDW: 13.4 % (ref 11.5–15.5)
WBC: 7 10*3/uL (ref 4.0–10.5)

## 2014-12-02 LAB — BASIC METABOLIC PANEL
Anion gap: 5 (ref 5–15)
CALCIUM: 9.4 mg/dL (ref 8.4–10.5)
CHLORIDE: 108 mmol/L (ref 96–112)
CO2: 24 mmol/L (ref 19–32)
Creatinine, Ser: 0.51 mg/dL (ref 0.50–1.10)
GFR calc Af Amer: >90 mL/min (ref >90)
Glucose, Bld: 105 mg/dL — ABNORMAL HIGH (ref 70–99)
Potassium: 3.8 mmol/L (ref 3.5–5.1)
Sodium: 137 mmol/L (ref 135–145)

## 2014-12-02 LAB — BRAIN NATRIURETIC PEPTIDE: B Natriuretic Peptide: 86.4 pg/mL (ref 0.0–100.0)

## 2014-12-02 MED ORDER — ADULT MULTIVITAMIN W/MINERALS CH
1.0000 | ORAL_TABLET | Freq: Every day | ORAL | Status: DC
  Administered 2014-12-02 – 2014-12-03 (×2): 1 via ORAL
  Filled 2014-12-02 (×2): qty 1

## 2014-12-02 MED ORDER — ONDANSETRON HCL 4 MG/2ML IJ SOLN: 4.0000 mg | Freq: Four times a day (QID) | INTRAMUSCULAR | Status: DC | PRN

## 2014-12-02 MED ORDER — THIAMINE HCL 100 MG/ML IJ SOLN
100.0000 mg | Freq: Every day | INTRAMUSCULAR | Status: DC
  Filled 2014-12-02 (×2): qty 1

## 2014-12-02 MED ORDER — CARVEDILOL 12.5 MG PO TABS
12.5000 mg | ORAL_TABLET | Freq: Two times a day (BID) | ORAL | Status: DC
  Administered 2014-12-02 – 2014-12-03 (×3): 12.5 mg via ORAL
  Filled 2014-12-02 (×5): qty 1

## 2014-12-02 MED ORDER — FOLIC ACID 1 MG PO TABS
1.0000 mg | ORAL_TABLET | Freq: Every day | ORAL | Status: DC
  Administered 2014-12-02 – 2014-12-03 (×2): 1 mg via ORAL
  Filled 2014-12-02 (×2): qty 1

## 2014-12-02 MED ORDER — PNEUMOCOCCAL VAC POLYVALENT 25 MCG/0.5ML IJ INJ
0.5000 mL | INJECTION | INTRAMUSCULAR | Status: AC
  Administered 2014-12-03: 0.5 mL via INTRAMUSCULAR
  Filled 2014-12-02: qty 0.5

## 2014-12-02 MED ORDER — PREDNISONE 20 MG PO TABS: 60.0000 mg | ORAL_TABLET | Freq: Every day | ORAL | Status: DC

## 2014-12-02 MED ORDER — LEVALBUTEROL HCL 0.63 MG/3ML IN NEBU
0.6300 mg | INHALATION_SOLUTION | Freq: Four times a day (QID) | RESPIRATORY_TRACT | Status: DC | PRN
  Administered 2014-12-03: 0.63 mg via RESPIRATORY_TRACT
  Filled 2014-12-02: qty 3

## 2014-12-02 MED ORDER — LEVALBUTEROL HCL 0.63 MG/3ML IN NEBU: 0.6300 mg | INHALATION_SOLUTION | Freq: Four times a day (QID) | RESPIRATORY_TRACT | Status: DC

## 2014-12-02 MED ORDER — ACETAMINOPHEN 650 MG RE SUPP: 650.0000 mg | Freq: Four times a day (QID) | RECTAL | Status: DC | PRN

## 2014-12-02 MED ORDER — ASPIRIN 325 MG PO TABS
325.0000 mg | ORAL_TABLET | Freq: Every day | ORAL | Status: DC
  Administered 2014-12-02 – 2014-12-03 (×2): 325 mg via ORAL
  Filled 2014-12-02 (×2): qty 1

## 2014-12-02 MED ORDER — HYDROCODONE-ACETAMINOPHEN 5-325 MG PO TABS: 1.0000 | ORAL_TABLET | ORAL | Status: DC | PRN

## 2014-12-02 MED ORDER — GUAIFENESIN ER 600 MG PO TB12
1200.0000 mg | ORAL_TABLET | Freq: Two times a day (BID) | ORAL | Status: DC
  Administered 2014-12-02 – 2014-12-03 (×3): 1200 mg via ORAL
  Filled 2014-12-02 (×5): qty 2

## 2014-12-02 MED ORDER — VITAMIN B-1 100 MG PO TABS
100.0000 mg | ORAL_TABLET | Freq: Every day | ORAL | Status: DC
  Administered 2014-12-02 – 2014-12-03 (×2): 100 mg via ORAL
  Filled 2014-12-02 (×2): qty 1

## 2014-12-02 MED ORDER — ACETAMINOPHEN 325 MG PO TABS
650.0000 mg | ORAL_TABLET | Freq: Four times a day (QID) | ORAL | Status: DC | PRN
Start: 2014-12-02 — End: 2014-12-03

## 2014-12-02 MED ORDER — LORAZEPAM 2 MG/ML IJ SOLN: 1.0000 mg | Freq: Four times a day (QID) | INTRAMUSCULAR | Status: DC | PRN

## 2014-12-02 MED ORDER — INFLUENZA VAC SPLIT QUAD 0.5 ML IM SUSY
0.5000 mL | PREFILLED_SYRINGE | INTRAMUSCULAR | Status: AC
  Administered 2014-12-03: 0.5 mL via INTRAMUSCULAR
  Filled 2014-12-02: qty 0.5

## 2014-12-02 MED ORDER — LORAZEPAM 1 MG PO TABS: 1.0000 mg | ORAL_TABLET | Freq: Four times a day (QID) | ORAL | Status: DC | PRN

## 2014-12-02 MED ORDER — DOCUSATE SODIUM 100 MG PO CAPS
100.0000 mg | ORAL_CAPSULE | Freq: Two times a day (BID) | ORAL | Status: DC
  Administered 2014-12-02: 100 mg via ORAL
  Filled 2014-12-02 (×3): qty 1

## 2014-12-02 MED ORDER — MAGNESIUM SULFATE 2 GM/50ML IV SOLN
2.0000 g | Freq: Once | INTRAVENOUS | Status: AC
  Administered 2014-12-02: 2 g via INTRAVENOUS
  Filled 2014-12-02: qty 50

## 2014-12-02 MED ORDER — ONDANSETRON HCL 4 MG PO TABS: 4.0000 mg | ORAL_TABLET | Freq: Four times a day (QID) | ORAL | Status: DC | PRN

## 2014-12-02 MED ORDER — PREDNISONE 20 MG PO TABS
40.0000 mg | ORAL_TABLET | Freq: Every day | ORAL | Status: DC
  Administered 2014-12-02 – 2014-12-03 (×2): 40 mg via ORAL
  Filled 2014-12-02 (×3): qty 2

## 2014-12-02 MED ORDER — ENOXAPARIN SODIUM 40 MG/0.4ML ~~LOC~~ SOLN
40.0000 mg | SUBCUTANEOUS | Status: DC
  Filled 2014-12-02 (×2): qty 0.4

## 2014-12-02 MED ORDER — SODIUM CHLORIDE 0.9 % IJ SOLN
3.0000 mL | Freq: Two times a day (BID) | INTRAMUSCULAR | Status: DC
  Administered 2014-12-02 – 2014-12-03 (×3): 3 mL via INTRAVENOUS

## 2014-12-02 MED ORDER — ATORVASTATIN CALCIUM 10 MG PO TABS
10.0000 mg | ORAL_TABLET | Freq: Every day | ORAL | Status: DC
  Administered 2014-12-02: 10 mg via ORAL
  Filled 2014-12-02 (×2): qty 1

## 2014-12-02 MED ORDER — ALBUTEROL SULFATE (2.5 MG/3ML) 0.083% IN NEBU: 2.5000 mg | INHALATION_SOLUTION | RESPIRATORY_TRACT | Status: DC

## 2014-12-02 MED ORDER — ENSURE ENLIVE PO LIQD
237.0000 mL | ORAL | Status: DC
  Administered 2014-12-02 – 2014-12-03 (×2): 237 mL via ORAL

## 2014-12-02 MED ORDER — SODIUM CHLORIDE 0.9 % IV BOLUS (SEPSIS)
1000.0000 mL | Freq: Once | INTRAVENOUS | Status: AC
  Administered 2014-12-02: 1000 mL via INTRAVENOUS

## 2014-12-02 MED ORDER — ALBUTEROL (5 MG/ML) CONTINUOUS INHALATION SOLN
10.0000 mg/h | INHALATION_SOLUTION | RESPIRATORY_TRACT | Status: DC
  Administered 2014-12-02: 10 mg/h via RESPIRATORY_TRACT
  Filled 2014-12-02: qty 20

## 2014-12-02 MED ORDER — PREDNISONE 20 MG PO TABS
40.0000 mg | ORAL_TABLET | Freq: Every day | ORAL | Status: DC
  Filled 2014-12-02: qty 2

## 2014-12-02 MED ORDER — NICOTINE 21 MG/24HR TD PT24
21.0000 mg | MEDICATED_PATCH | Freq: Every day | TRANSDERMAL | Status: DC
  Administered 2014-12-02 – 2014-12-03 (×2): 21 mg via TRANSDERMAL
  Filled 2014-12-02 (×2): qty 1

## 2014-12-02 MED ORDER — LISINOPRIL 20 MG PO TABS
20.0000 mg | ORAL_TABLET | Freq: Every day | ORAL | Status: DC
Start: 2014-12-02 — End: 2014-12-03
  Administered 2014-12-02 – 2014-12-03 (×2): 20 mg via ORAL
  Filled 2014-12-02 (×2): qty 1

## 2014-12-02 MED ORDER — ALUM & MAG HYDROXIDE-SIMETH 200-200-20 MG/5ML PO SUSP
30.0000 mL | Freq: Four times a day (QID) | ORAL | Status: DC | PRN
Start: 2014-12-02 — End: 2014-12-03

## 2014-12-02 MED ORDER — NITROGLYCERIN 0.4 MG SL SUBL: 0.4000 mg | SUBLINGUAL_TABLET | SUBLINGUAL | Status: DC | PRN

## 2014-12-02 NOTE — Progress Notes (Signed)
Sp02 on room air at rest 97%. Sp02 on room air ambulating 92-95%. Patient denied any shortness of breath and stated "I feel much better". York, PA made aware.

## 2014-12-02 NOTE — Progress Notes (Signed)
FLU PCR negative. York, PA made aware. Order to d/c droplet precaution.

## 2014-12-02 NOTE — ED Notes (Signed)
Heart healthy diet breakfast tray ordered

## 2014-12-02 NOTE — Progress Notes (Signed)
INITIAL NUTRITION ASSESSMENT  DOCUMENTATION CODES Per approved criteria  -Non-severe (moderate) malnutrition in the context of chronic illness  Pt meets criteria for non-severe (moderate) malnutrition in the context of chronic illness as evidenced by <75% energy intake for >1 month, and mild subcutaneous body fat and muscle mass depletion.   INTERVENTION: Continue providing Ensure Enlive BID, contains 350 kcal, 20 grams of protein, 180 ml of free water  NUTRITION DIAGNOSIS: Inadequate oral intake related to decreased appetite as evidenced by poor PO intake >1 month.   Goal: Pt to meet >/= 90% of estimated energy needs  Monitor:  PO intake, weight trends, labs  Reason for Assessment: MST 5  59 y.o. female  Admitting Dx: Acute bronchitis  ASSESSMENT: 59 y/o female with a past medical history of V. Tach, AICD, CHF, HLD and ongoing tobacco use. She presents from home to the ER with her children. She reported ongoing cold symptoms x 1 week, and woke up SOB this morning. Admitted to a telemetry bed for observation for acute on chronic bronchitis.   Weight history reveals 7% wt loss in 5 months. Pt confirmed recent weight loss, and reported having a decreased appetite for the past month. She denied any nausea, vomiting, abdominal pain or diarrhea. She stated that she just "hasn't felt hungry."  Talked with pt about Ensure, and increasing PO intake. Current PO intake is >50%.  Labs and medications reviewed: Low BUN  Nutrition Focused Physical Exam:  Subcutaneous Fat:  Orbital Region: mild depletion Upper Arm Region: WDL Thoracic and Lumbar Region: WDL  Muscle:  Temple Region: mild depletion Clavicle Bone Region: mild depletion Clavicle and Acromion Bone Region: mild depletion Scapular Bone Region: mild depletion Dorsal Hand: WDL Patellar Region: WDL Anterior Thigh Region: WDL Posterior Calf Region: WDL  Edema: not present   Height: Ht Readings from Last 1 Encounters:   12/02/14 5\' 9"  (1.753 m)    Weight: Wt Readings from Last 1 Encounters:  12/02/14 140 lb (63.504 kg)  Admission weight: 136 lb (61.8 kg)  Ideal Body Weight: 145 lb (65.9 kg)  % Ideal Body Weight: 96.5%  Wt Readings from Last 10 Encounters:  12/02/14 140 lb (63.504 kg)  11/29/14 136 lb 9.6 oz (61.961 kg)  06/21/14 146 lb 12.8 oz (66.588 kg)  06/03/14 149 lb 12.8 oz (67.949 kg)  11/11/13 153 lb (69.4 kg)  01/29/13 161 lb 9.6 oz (73.301 kg)  11/09/12 166 lb (75.297 kg)  10/22/12 160 lb 8 oz (72.802 kg)  10/14/12 163 lb 12.8 oz (74.299 kg)  08/26/12 168 lb 12 oz (76.544 kg)    Usual Body Weight: 150 lb (68.2 kg)  % Usual Body Weight: 93%  BMI:  Body mass index is 20.67 kg/(m^2). Normal  Estimated Nutritional Needs: Kcal: 1650-1850 Protein: 70-85 grams Fluid: >/= 1.6L daily   Skin: intact  Diet Order: Diet Heart Room service appropriate?: Yes; Fluid consistency:: Thin  EDUCATION NEEDS: -No education needs identified at this time   Intake/Output Summary (Last 24 hours) at 12/02/14 0944 Last data filed at 12/02/14 0933  Gross per 24 hour  Intake   1120 ml  Output      0 ml  Net   1120 ml    Last BM: pta  Labs:   Recent Labs Lab 12/02/14 0525  NA 137  K 3.8  CL 108  CO2 24  BUN <5*  CREATININE 0.51  CALCIUM 9.4  GLUCOSE 105*    CBG (last 3)  No results for input(s):  GLUCAP in the last 72 hours.  Scheduled Meds: . aspirin  325 mg Oral Daily  . atorvastatin  10 mg Oral q1800  . carvedilol  12.5 mg Oral BID WC  . docusate sodium  100 mg Oral BID  . enoxaparin (LOVENOX) injection  40 mg Subcutaneous Q24H  . feeding supplement (ENSURE ENLIVE)  237 mL Oral Q24H  . folic acid  1 mg Oral Daily  . guaiFENesin  1,200 mg Oral BID  . lisinopril  20 mg Oral Daily  . multivitamin with minerals  1 tablet Oral Daily  . nicotine  21 mg Transdermal Daily  . predniSONE  40 mg Oral Q breakfast  . sodium chloride  3 mL Intravenous Q12H  . thiamine  100 mg  Oral Daily   Or  . thiamine  100 mg Intravenous Daily    Continuous Infusions:   Past Medical History  Diagnosis Date  . HYPERLIPIDEMIA-MIXED 02/07/2009  . HYPERTENSION, BENIGN 08/22/2009  . CAD (coronary artery disease)     a. Inf-post MI 2008 s/p BMS to large marginal of Cx.   Marland Kitchen VENTRICULAR TACHYCARDIA 05/21/2010  . SYSTOLIC HEART FAILURE, CHRONIC 02/07/2009    a. EF 30% 2010, 29% by MRI 08/2012. b. s/p prophylactic Medtronic ICD implantation 10/2012.  . Tobacco abuse   . PVD (peripheral vascular disease)     a. Evaluated by Dr. [name] Anon 08/2012.  Marland Kitchen Automatic implantable cardioverter-defibrillator in situ   . TIA (transient ischemic attack) 05/18/2014    Past Surgical History  Procedure Laterality Date  . Cardiac defibrillator placement  10/2012  . Cystoscopy w/ stone manipulation  1990's  . Cesarean section  1977; 1982  . Implantable cardioverter defibrillator implant N/A 10/21/2012    Procedure: IMPLANTABLE CARDIOVERTER DEFIBRILLATOR IMPLANT;  Surgeon: Evans Lance, MD;  Location: [facility] CATH LAB;  Service: Cardiovascular;  Laterality: N/A;    Wynona Dove, MS Dietetic Intern Pager: [phone]

## 2014-12-02 NOTE — ED Notes (Signed)
Pt arrives via EMS from home with c/o cough ongoing for the last few days and awoke this morning feelings as if she couldn't breathe. Has missed a few doses of beta blocker. 10mg  albuterol and 1.0 Atrovent, 125 solumedrol on board.

## 2014-12-02 NOTE — Progress Notes (Signed)
Orthostatic VS Checked:  Lying BP 126/63 HR 61 Sitting BP 122/68 HR 66 Standing BP 108/70 HR 69 Standing 3 minutes BP 121/65 HR 65  Patient denied any dizziness or other symptoms. Patient denies feeling light headed when she stands up at home either.

## 2014-12-02 NOTE — Progress Notes (Signed)
Report received from admission to [id number] from [facility]

## 2014-12-02 NOTE — ED Provider Notes (Signed)
CSN: [id number]     Arrival date & time 12/02/14  0518 History   First MD Initiated Contact with Patient 12/02/14 [phone]     Chief Complaint  Patient presents with  . Shortness of Breath     (Consider location/radiation/quality/duration/timing/severity/associated sxs/prior Treatment) HPI   [name] is a 59 y.o. female with past medical history of coronary artery disease, hypertension, hyperlipidemia, CHF status post AICD placement, TIA presenting today with shortness of breath. Patient states is going on for the past week having last 2 days she felt like she could not breathe. She denies any history of asthma or COPD. She is 95% on room air upon EMS arrival, they gave her 10 mg of albuterol, 1 mg of ipratropium, 125 mg of Solu-Medrol. Patient states she currently feels better after these interventions. She is denying any chest pain currently. She has had a productive cough of white sputum. She denies any fevers. Patient has no further complaints. She states she has been compliant with her medications.  10 Systems reviewed and are negative for acute change except as noted in the HPI.     Past Medical History  Diagnosis Date  . HYPERLIPIDEMIA-MIXED 02/07/2009  . HYPERTENSION, BENIGN 08/22/2009  . CAD (coronary artery disease)     a. Inf-post MI 2008 s/p BMS to large marginal of Cx.   Marland Kitchen VENTRICULAR TACHYCARDIA 05/21/2010  . SYSTOLIC HEART FAILURE, CHRONIC 02/07/2009    a. EF 30% 2010, 29% by MRI 08/2012. b. s/p prophylactic Medtronic ICD implantation 10/2012.  . Tobacco abuse   . PVD (peripheral vascular disease)     a. Evaluated by Dr. [name] Anon 08/2012.  Marland Kitchen Automatic implantable cardioverter-defibrillator in situ   . TIA (transient ischemic attack) 05/18/2014   Past Surgical History  Procedure Laterality Date  . Cardiac defibrillator placement  10/2012  . Cystoscopy w/ stone manipulation  1990's  . Cesarean section  1977; 1982  . Implantable cardioverter defibrillator implant N/A  10/21/2012    Procedure: IMPLANTABLE CARDIOVERTER DEFIBRILLATOR IMPLANT;  Surgeon: Evans Lance, MD;  Location: [facility] CATH LAB;  Service: Cardiovascular;  Laterality: N/A;   Family History  Problem Relation Age of Onset  . Cancer Mother     cervical  . Heart attack Father   . Asthma Brother   . Other Brother     Pacemaker   History  Substance Use Topics  . Smoking status: Current Every Day Smoker -- 0.50 packs/day for 40 years    Types: Cigarettes  . Smokeless tobacco: Never Used  . Alcohol Use: 12.0 oz/week    20 Cans of beer per week     Comment: 05/18/2014 "2, 40's qod"   OB History    No data available     Review of Systems    Allergies  Clopidogrel bisulfate  Home Medications   Prior to Admission medications   Medication Sig Start Date End Date Taking? Authorizing Provider  acetaminophen (TYLENOL) 500 MG tablet Take 500 mg by mouth every 6 (six) hours as needed. For headache    Historical Provider, MD  aspirin 325 MG tablet Take 1 tablet (325 mg total) by mouth daily. 05/19/14   Maryann Mikhail, DO  atorvastatin (LIPITOR) 10 MG tablet Take 1 tablet (10 mg total) by mouth daily at 6 PM. 06/20/14   [name], MD  carvedilol (COREG) 12.5 MG tablet Take 1 tablet (12.5 mg total) by mouth 2 (two) times daily with a meal. 06/20/14   [name], MD  feeding supplement, ENSURE COMPLETE, (ENSURE COMPLETE) LIQD Take 237 mLs by mouth daily. 05/19/14   Maryann Mikhail, DO  lisinopril (PRINIVIL,ZESTRIL) 20 MG tablet Take 1 tablet (20 mg total) by mouth daily. 06/20/14   [name], MD  nicotine (NICODERM CQ - DOSED IN MG/24 HR) 7 mg/24hr patch Place 1 patch (7 mg total) onto the skin at bedtime. 05/19/14   Maryann Mikhail, DO  nitroGLYCERIN (NITROSTAT) 0.4 MG SL tablet Place 1 tablet (0.4 mg total) under the tongue every 5 (five) minutes as needed for chest pain (up to 3 doses). 06/20/14   [name], MD  PROAIR HFA 108 [phone] BASE) MCG/ACT inhaler Inhale 2 puffs into the  lungs every 6 (six) hours as needed. Shortness of breath or wheezing 11/28/14   Historical Provider, MD   BP 149/92 mmHg  Pulse 65  Resp 24  SpO2 99% Physical Exam  Constitutional: She is oriented to person, place, and time. She appears well-developed and well-nourished. She appears distressed.  HENT:  Head: Normocephalic and atraumatic.  Nose: Nose normal.  Mouth/Throat: Oropharynx is clear and moist. No oropharyngeal exudate.  Eyes: Conjunctivae and EOM are normal. Pupils are equal, round, and reactive to light. No scleral icterus.  Neck: Normal range of motion. Neck supple. No JVD present. No tracheal deviation present. No thyromegaly present.  Cardiovascular: Normal rate, regular rhythm and normal heart sounds.  Exam reveals no gallop and no friction rub.   No murmur heard. Pulmonary/Chest: She is in respiratory distress. She has wheezes. She exhibits no tenderness.  Inc work of breathing, exp wheezes heard with prolonged phase  Abdominal: Soft. Bowel sounds are normal. She exhibits no distension and no mass. There is no tenderness. There is no rebound and no guarding.  Musculoskeletal: Normal range of motion. She exhibits no edema or tenderness.  Lymphadenopathy:    She has no cervical adenopathy.  Neurological: She is alert and oriented to person, place, and time. No cranial nerve deficit. She exhibits normal muscle tone.  Skin: Skin is warm and dry. No rash noted. She is not diaphoretic. No erythema. No pallor.  Nursing note and vitals reviewed.   ED Course  Procedures (including critical care time) Labs Review Labs Reviewed  CBC WITH DIFFERENTIAL/PLATELET - Abnormal; Notable for the following:    Hemoglobin 15.1 (*)    Platelets 146 (*)    Neutrophils Relative % 33 (*)    Lymphocytes Relative 50 (*)    All other components within normal limits  BASIC METABOLIC PANEL - Abnormal; Notable for the following:    Glucose, Bld 105 (*)    BUN <5 (*)    All other components  within normal limits  BRAIN NATRIURETIC PEPTIDE  INFLUENZA PANEL BY PCR (TYPE A & B, H1N1)    Imaging Review Dg Chest 2 View  12/02/2014   CLINICAL DATA:  Initial evaluation for shortness of breath, cough for 1 week.  EXAM: CHEST  2 VIEW  COMPARISON:  Prior radiograph from 10/22/12.  FINDINGS: Left-sided single lead transvenous pacemaker/AICD is unchanged. Cardiomegaly is stable. Mediastinal silhouette within normal limits.  Lungs are normally inflated. No focal infiltrate, pulmonary edema, or pleural effusion. There is no pneumothorax.  No acute osseus abnormality. Cholecystectomy clips overlie the right upper quadrant.  IMPRESSION: 1. No active cardiopulmonary disease. 2. Stable cardiomegaly without pulmonary edema.   Electronically Signed   By: Jeannine Boga M.D.   On: 12/02/2014 06:05     EKG Interpretation   Date/Time:  Friday December 02 2014 05:33:16 EDT Ventricular Rate:  66 PR Interval:  178 QRS Duration: 114 QT Interval:  435 QTC Calculation: 456 R Axis:   31 Text Interpretation:  Sinus rhythm Multiple ventricular premature  complexes Left ventricular hypertrophy No significant change since last  tracing Confirmed by Glynn Octave [phone]) on 12/02/2014 5:47:12 AM      MDM   Final diagnoses:  SOB (shortness of breath)    Patient presents to the ED for SOB.  She is mildly wheezing on exam, her symptoms have improved with albuterol given by EMS.  Will obtain labs and cxr due to severity of her symptoms.  She was given IVF and mag as well in the ED.  I do not believe this is consistent with ACS.  She does not have risk factors for PE.    CXR neg for pna.  After interventions, wheezing has stopped however patient continues to be tachypneic with room air sat of 90%.  She will require admission for continued management.     Everlene Balls, MD 12/02/14 1345

## 2014-12-02 NOTE — Progress Notes (Signed)
[name] is a 59 y.o. female patient admitted from ED awake, alert - oriented  X 3 - no acute distress noted.  VSS - Blood pressure 148/69, pulse 56, temperature 97.7 F (36.5 C), temperature source Oral, resp. rate 18, height 5\' 9"  (1.753 m), weight 63.504 kg (140 lb), SpO2 96 %. Nasal Cannula 1L.   IV in place, occlusive dsg intact without redness.  Orientation to room, and floor completed with information packet given to patient/family.  Patient declined safety video at this time.  Admission INP armband ID verified with patient/family, and in place.   SR up x 2, fall assessment complete, with patient and family able to verbalize understanding of risk associated with falls, and verbalized understanding to call nsg before up out of bed.  Call light within reach, patient able to voice, and demonstrate understanding.  Skin, clean-dry- intact without evidence of bruising, or skin tears.   No evidence of skin break down noted on exam.    Patient states she smokes 1 pack of cigarettes a week and drinks 2-4 beers a day. No withdrawal symptoms noted at this time, CIWA 0. Last drink of alcohol 11/30/14.   Will cont to eval and treat per MD orders.  Janalyn Shy, RN 12/02/2014 10:23 AM

## 2014-12-02 NOTE — ED Notes (Signed)
Pt c/o shortness of breath x several days; reports feeling like having a cold. Increased sob started tonight. Pt received a 10mg  albuterol treatment, 125mg  Solumedrol and 1mg  atrovent by ems. Lung sounds clear at present. Oxygen sats at room air are 87-90%. Dr.oni aware. Pt placed on 2L [place]

## 2014-12-02 NOTE — H&P (Signed)
[facility] History and Physical                                                                                    [name], is a 59 y.o. female  MRN: [id number]   DOB - 03-Aug-1956  Admit Date - 12/02/2014  Outpatient Primary MD for the patient is Vicenta Aly, FNP  With History of -  Past Medical History  Diagnosis Date  . HYPERLIPIDEMIA-MIXED 02/07/2009  . HYPERTENSION, BENIGN 08/22/2009  . CAD (coronary artery disease)     a. Inf-post MI 2008 s/p BMS to large marginal of Cx.   Marland Kitchen VENTRICULAR TACHYCARDIA 05/21/2010  . SYSTOLIC HEART FAILURE, CHRONIC 02/07/2009    a. EF 30% 2010, 29% by MRI 08/2012. b. s/p prophylactic Medtronic ICD implantation 10/2012.  . Tobacco abuse   . PVD (peripheral vascular disease)     a. Evaluated by Dr. [name] Anon 08/2012.  Marland Kitchen Automatic implantable cardioverter-defibrillator in situ   . TIA (transient ischemic attack) 05/18/2014      Past Surgical History  Procedure Laterality Date  . Cardiac defibrillator placement  10/2012  . Cystoscopy w/ stone manipulation  1990's  . Cesarean section  1977; 1982  . Implantable cardioverter defibrillator implant N/A 10/21/2012    Procedure: IMPLANTABLE CARDIOVERTER DEFIBRILLATOR IMPLANT;  Surgeon: Evans Lance, MD;  Location: [facility] CATH LAB;  Service: Cardiovascular;  Laterality: N/A;    in for   Chief Complaint  Patient presents with  . Shortness of Breath     HPI  [name]  is a 59 y.o. female, with a past medical history significant for V. tach, AICD, CHF with an EF of 15% and September 2015, TIA, hypertension, hyperlipidemia, and ongoing tobacco use. She presents from home to the ER with her children. She states she has had cold symptoms (a runny nose and slight cough) for one week. She was feeling okay until she woke up very short of breath at 3 AM this morning. She called EMS and was transported to the emergency department. She specifically denies chest pain, palpitations, dyspnea on exertion,  decreased exercise tolerance, previous episodes of paroxysmal nocturnal dyspnea.   In the ER she received Solu-Medrol, nebulizer treatments, and magnesium, and she feels much better. Unfortunately ambulating oxygen saturation was 88% on room air. We will admit her to a telemetry bed for observation for acute on chronic bronchitis.  Review of Systems   In addition to the HPI above,  No Fever-chills, No Headache, No changes with Vision or hearing, No problems swallowing food or Liquids, No Chest pain, Cough  No Abdominal pain, No Nausea or Vomiting, Bowel movements are regular, No Blood in stool or Urine, No dysuria, No new skin rashes or bruises, No new joints pains-aches,  No new weakness, tingling, numbness in any extremity, No recent weight gain or loss, A full 10 point Review of Systems was done, except as stated above, all other Review of Systems were negative.  Social History History  Substance Use Topics  . Smoking status: Current Every Day Smoker -- 0.50 packs/day for 40 years    Types: Cigarettes  . Smokeless tobacco: Never Used  . Alcohol Use:  12.0 oz/week    20 Cans of beer per week     Comment: 05/18/2014 "2, 40's qod"    Family History Family History  Problem Relation Age of Onset  . Cancer Mother     cervical  . Heart attack Father   . Asthma Brother   . Other Brother     Pacemaker    Prior to Admission medications   Medication Sig Start Date End Date Taking? Authorizing Provider  acetaminophen (TYLENOL) 500 MG tablet Take 500 mg by mouth every 6 (six) hours as needed. For headache    Historical Provider, MD  aspirin 325 MG tablet Take 1 tablet (325 mg total) by mouth daily. 05/19/14   Maryann Mikhail, DO  atorvastatin (LIPITOR) 10 MG tablet Take 1 tablet (10 mg total) by mouth daily at 6 PM. 06/20/14   [name], MD  carvedilol (COREG) 12.5 MG tablet Take 1 tablet (12.5 mg total) by mouth 2 (two) times daily with a meal. 06/20/14   [name], MD   feeding supplement, ENSURE COMPLETE, (ENSURE COMPLETE) LIQD Take 237 mLs by mouth daily. 05/19/14   Maryann Mikhail, DO  lisinopril (PRINIVIL,ZESTRIL) 20 MG tablet Take 1 tablet (20 mg total) by mouth daily. 06/20/14   [name], MD  nicotine (NICODERM CQ - DOSED IN MG/24 HR) 7 mg/24hr patch Place 1 patch (7 mg total) onto the skin at bedtime. 05/19/14   Maryann Mikhail, DO  nitroGLYCERIN (NITROSTAT) 0.4 MG SL tablet Place 1 tablet (0.4 mg total) under the tongue every 5 (five) minutes as needed for chest pain (up to 3 doses). 06/20/14   [name], MD  predniSONE (DELTASONE) 20 MG tablet Take 3 tablets (60 mg total) by mouth daily. 12/02/14   Everlene Balls, MD  PROAIR HFA 108 (90 BASE) MCG/ACT inhaler Inhale 2 puffs into the lungs every 6 (six) hours as needed. Shortness of breath or wheezing 11/28/14   Historical Provider, MD    Allergies  Allergen Reactions  . Clopidogrel Bisulfate     REACTION: Rash    Physical Exam  Vitals  Blood pressure 135/70, pulse 59, temperature 98.9 F (37.2 C), temperature source Oral, resp. rate 21, height 5\' 9"  (1.753 m), weight 63.504 kg (140 lb), SpO2 97 %.   General: Well-developed, thin, AA female lying in bed in NAD, 2 daughters and nephew are bedside  Psych:  Normal affect and insight, Not Suicidal or Homicidal, Awake Alert, Oriented X 3.  Neuro:   No F.N deficits, ALL C.Nerves Intact, Strength 5/5 all 4 extremities, Sensation intact all 4 extremities.  ENT:  Ears and Eyes appear Normal, Conjunctivae clear, PER. Moist oral mucosa without erythema or exudates.  Neck:  Supple, No lymphadenopathy appreciated  Respiratory:  Symmetrical chest wall movement, Good air movement bilaterally, CTAB. On oxygen via nasal cannula  Cardiac:  RRR, No Murmurs, no LE edema noted, no JVD.  Palpable AICD  Abdomen:  Positive bowel sounds, Soft, Non tender, Non distended,  No masses appreciated  Skin:  No Cyanosis, Normal Skin Turgor, No Skin Rash or  Bruise.  Extremities:  Able to move all 4. 5/5 strength in each,  no effusions.  Data Review  CBC  Recent Labs Lab 12/02/14 0525  WBC 7.0  HGB 15.1*  HCT 45.3  PLT 146*  MCV 95.0  MCH 31.7  MCHC 33.3  RDW 13.4  LYMPHSABS 3.5  MONOABS 0.8  EOSABS 0.3  BASOSABS 0.0    Chemistries  Recent Labs Lab 12/02/14 0525  NA 137  K 3.8  CL 108  CO2 24  GLUCOSE 105*  BUN <5*  CREATININE 0.51  CALCIUM 9.4    Imaging results:   Dg Chest 2 View  12/02/2014   CLINICAL DATA:  Initial evaluation for shortness of breath, cough for 1 week.  EXAM: CHEST  2 VIEW  COMPARISON:  Prior radiograph from 10/22/12.  FINDINGS: Left-sided single lead transvenous pacemaker/AICD is unchanged. Cardiomegaly is stable. Mediastinal silhouette within normal limits.  Lungs are normally inflated. No focal infiltrate, pulmonary edema, or pleural effusion. There is no pneumothorax.  No acute osseus abnormality. Cholecystectomy clips overlie the right upper quadrant.  IMPRESSION: 1. No active cardiopulmonary disease. 2. Stable cardiomegaly without pulmonary edema.   Electronically Signed   By: Jeannine Boga M.D.   On: 12/02/2014 06:05     Assessment & Plan  Principal Problem:   Acute bronchitis Active Problems:   CAD, NATIVE VESSEL   VENTRICULAR TACHYCARDIA   Smoker   PVD (peripheral vascular disease)   Automatic implantable cardioverter-defibrillator in situ   COPD with acute exacerbation   Systolic heart failure   Dyspnea   Alcohol use   Elevated hemoglobin   Acute on chronic bronchitis This appears to be viral in nature given that she is afebrile, with a normal white count and relatively elevated lymphocytes. Influenza PCR, and droplet precautions are ordered. We will admit for observation and treat supportively with nebulizers, prednisone, Mucinex Chest x-ray is clear. Smoking cessation recommended. She may need oxygen at discharge.  Ongoing tobacco abuse Patient states she  knows she needs to quit smoking.  Nicotine patch ordered  Congestive heart failure Appears euvolemic. Possibly slightly dry. LVEF 15% in September 2015. Will not give IV fluids. Continue home medications including Coreg 12.5 and lisinopril.  Coronary artery disease status post stenting and AICD placement Continue 325 mg aspirin and Lipitor  Alcohol use May be dependent. Will order CIWA    DVT Prophylaxis:Lovenox  AM Labs Ordered, also please review Full Orders  Family Communication:   2 daughters and nephew are bedside  Disposition: Expect patient to return to home when appropriate. May need home oxygen and home health RN.  Code Status:  Full  Condition:  Guarded  Time spent in minutes : [address]  PA-C on 12/02/2014 at 8:33 AM  Between 7am to 7pm - Pager - [phone]  After 7pm go to [email or web address] - password [email or web address]  And look for the night coverage person covering me after hours  [facility]   e

## 2014-12-03 DIAGNOSIS — J9601 Acute respiratory failure with hypoxia: Secondary | ICD-10-CM | POA: Diagnosis not present

## 2014-12-03 DIAGNOSIS — Z720 Tobacco use: Secondary | ICD-10-CM | POA: Diagnosis not present

## 2014-12-03 DIAGNOSIS — I10 Essential (primary) hypertension: Secondary | ICD-10-CM | POA: Diagnosis not present

## 2014-12-03 DIAGNOSIS — J440 Chronic obstructive pulmonary disease with acute lower respiratory infection: Secondary | ICD-10-CM | POA: Diagnosis not present

## 2014-12-03 DIAGNOSIS — J441 Chronic obstructive pulmonary disease with (acute) exacerbation: Secondary | ICD-10-CM | POA: Diagnosis not present

## 2014-12-03 MED ORDER — NICOTINE 7 MG/24HR TD PT24: 7.0000 mg | MEDICATED_PATCH | Freq: Every day | TRANSDERMAL | Status: DC

## 2014-12-03 MED ORDER — GUAIFENESIN ER 600 MG PO TB12: 1200.0000 mg | ORAL_TABLET | Freq: Two times a day (BID) | ORAL | Status: DC | PRN

## 2014-12-03 NOTE — Progress Notes (Signed)
Attempt to schedule patient's follow up appointment. Office closed. Patient given information to call and set up a follow up in 1 week per MD order.

## 2014-12-03 NOTE — Progress Notes (Signed)
UR completed 

## 2014-12-03 NOTE — Discharge Summary (Signed)
Physician Discharge Summary  [name] DZH:[id number] DOB: August 20, 1956 DOA: 12/02/2014  PCP: Karen Aly, FNP  Admit date: 12/02/2014 Discharge date: 12/03/2014  Recommendations for Outpatient Follow-up:  1. Check CBC and BMP during next visit to PCP 2. Since it is Saturday patient will scheduled her own appt. 3. She was counseled on smoking cessation   Discharge Diagnoses:  Principal Problem:   Acute bronchitis Active Problems:   CAD, NATIVE VESSEL   VENTRICULAR TACHYCARDIA   Smoker   PVD (peripheral vascular disease)   Automatic implantable cardioverter-defibrillator in situ   COPD with acute exacerbation   Systolic heart failure   Dyspnea   Alcohol use   Elevated hemoglobin   Acute respiratory failure with hypoxia    Discharge Condition: stable   Diet recommendation: as tolerated   History of present illness:  59 y.o. female, with a past medical history significant for V. tach, AICD, CHF with an EF of 15% in September 2015, TIA, hypertension, hyperlipidemia, ongoing tobacco use. She presented with c/o upper respiratory tract symptoms, congestion, runny nose, cough. She felt all right until the morning prior to the admission when she started having ongoing shortness of breath.  On admission, she was given neb tx, steroids. Her CXR did not reveal acute cardiopulmonary findings. Blood work was essentially unremarkable.    Hospital Course:  Acute respiratory failure with hypoxia / Acute COPD exacerbation / Ongoing tobacco abuse - Patient is not breathing at 98% without [place] oxygen support - She will continue current home neb treatment - No need for steroids on discharge since no wheezing and no crackles - She was advised on smoking cessation - Influenza panel was negative   Chronic systolic ongestive heart failure - Appears euvolemic. Possibly slightly dry. - LVEF 15% in September 2015. - Continue home medications including Coreg and Lisinopril.  Coronary  artery disease status post stenting and AICD placement - Continue Aspirin and Lipitor  DVT Prophylaxis - Lovenox in hospital    Signed:  Leisa Lenz, MD  [facility] 12/03/2014, 10:22 AM  Pager #: [phone]    Discharge Exam: Filed Vitals:   12/03/14 0935  BP:   Pulse: 80  Temp:   Resp:    Filed Vitals:   12/03/14 0721 12/03/14 0821 12/03/14 0932 12/03/14 0935  BP:   145/90   Pulse:    80  Temp:      TempSrc:      Resp:      Height:      Weight: 62.4 kg (137 lb 9.1 oz)     SpO2:  96%      General: Pt is alert, follows commands appropriately, not in acute distress Cardiovascular: Regular rate and rhythm, S1/S2 +, no murmurs Respiratory: Clear to auscultation bilaterally, no wheezing, no crackles, no rhonchi Abdominal: Soft, non tender, non distended, bowel sounds +, no guarding Extremities: no edema, no cyanosis, pulses palpable bilaterally DP and PT Neuro: Grossly nonfocal  Discharge Instructions  Discharge Instructions    Call MD for:  difficulty breathing, headache or visual disturbances    Complete by:  As directed      Call MD for:  persistant nausea and vomiting    Complete by:  As directed      Call MD for:  severe uncontrolled pain    Complete by:  As directed      Diet - low sodium heart healthy    Complete by:  As directed      Increase activity slowly  Complete by:  As directed             Medication List    STOP taking these medications        nicotine 7 mg/24hr patch  Commonly known as:  NICODERM CQ - dosed in mg/24 hr      TAKE these medications        acetaminophen 500 MG tablet  Commonly known as:  TYLENOL  Take 500 mg by mouth every 6 (six) hours as needed. For headache     aspirin 325 MG tablet  Take 1 tablet (325 mg total) by mouth daily.     atorvastatin 10 MG tablet  Commonly known as:  LIPITOR  Take 1 tablet (10 mg total) by mouth daily at 6 PM.     carvedilol 12.5 MG tablet  Commonly known as:  COREG   Take 1 tablet (12.5 mg total) by mouth 2 (two) times daily with a meal.     feeding supplement (ENSURE COMPLETE) Liqd  Take 237 mLs by mouth daily.     guaiFENesin 600 MG 12 hr tablet  Commonly known as:  MUCINEX  Take 2 tablets (1,200 mg total) by mouth 2 (two) times daily as needed for cough or to loosen phlegm.     lisinopril 20 MG tablet  Commonly known as:  PRINIVIL,ZESTRIL  Take 1 tablet (20 mg total) by mouth daily.     nitroGLYCERIN 0.4 MG SL tablet  Commonly known as:  NITROSTAT  Place 1 tablet (0.4 mg total) under the tongue every 5 (five) minutes as needed for chest pain (up to 3 doses).     OVER THE COUNTER MEDICATION  Take 10 mLs by mouth every 4 (four) hours as needed (cough or cold symptoms).     PROAIR HFA 108 (90 BASE) MCG/ACT inhaler  Generic drug:  albuterol  Inhale 2 puffs into the lungs every 6 (six) hours as needed. Shortness of breath or wheezing           Follow-up Information    Follow up with Karen Aly, FNP. Schedule an appointment as soon as possible for a visit in 3 days.   Specialty:  Nurse Practitioner   Why:  for close follow up of your difficulty breathing   Contact information:   [place] [address] [phone]        The results of significant diagnostics from this hospitalization (including imaging, microbiology, ancillary and laboratory) are listed below for reference.    Significant Diagnostic Studies: Dg Chest 2 View  12/02/2014   CLINICAL DATA:  Initial evaluation for shortness of breath, cough for 1 week.  EXAM: CHEST  2 VIEW  COMPARISON:  Prior radiograph from 10/22/12.  FINDINGS: Left-sided single lead transvenous pacemaker/AICD is unchanged. Cardiomegaly is stable. Mediastinal silhouette within normal limits.  Lungs are normally inflated. No focal infiltrate, pulmonary edema, or pleural effusion. There is no pneumothorax.  No acute osseus abnormality. Cholecystectomy clips overlie the right  upper quadrant.  IMPRESSION: 1. No active cardiopulmonary disease. 2. Stable cardiomegaly without pulmonary edema.   Electronically Signed   By: Jeannine Boga M.D.   On: 12/02/2014 06:05    Microbiology: No results found for this or any previous visit (from the past 240 hour(s)).   Labs: Basic Metabolic Panel:  Recent Labs Lab 12/02/14 0525  NA 137  K 3.8  CL 108  CO2 24  GLUCOSE 105*  BUN <5*  CREATININE 0.51  CALCIUM 9.4  Liver Function Tests: No results for input(s): AST, ALT, ALKPHOS, BILITOT, PROT, ALBUMIN in the last 168 hours. No results for input(s): LIPASE, AMYLASE in the last 168 hours. No results for input(s): AMMONIA in the last 168 hours. CBC:  Recent Labs Lab 12/02/14 0525  WBC 7.0  NEUTROABS 2.3  HGB 15.1*  HCT 45.3  MCV 95.0  PLT 146*   Cardiac Enzymes: No results for input(s): CKTOTAL, CKMB, CKMBINDEX, TROPONINI in the last 168 hours. BNP: BNP (last 3 results)  Recent Labs  12/02/14 0525  BNP 86.4    ProBNP (last 3 results) No results for input(s): PROBNP in the last 8760 hours.  CBG: No results for input(s): GLUCAP in the last 168 hours.  Time coordinating discharge: Over 30 minutes

## 2014-12-03 NOTE — Discharge Instructions (Addendum)

## 2014-12-03 NOTE — Progress Notes (Signed)
Glenna Durand Sills to be D/C'd Home per MD order.  Discussed with the patient and all questions fully answered.  VSS, Skin clean, dry and intact without evidence of skin break down, no evidence of skin tears noted. IV catheter discontinued intact. Site without signs and symptoms of complications. Dressing and pressure applied.  An After Visit Summary was printed and given to the patient. Patient received prescription.  D/c education completed with patient/family including follow up instructions, medication list, d/c activities limitations if indicated, with other d/c instructions as indicated by MD - patient able to verbalize understanding, all questions fully answered.   Patient instructed to return to ED, call 911, or call MD for any changes in condition.   Patient escorted via [place], and D/C home via private auto.  L'ESPERANCE, Cayetano Mikita C 12/03/2014 11:55 AM

## 2014-12-09 ENCOUNTER — Encounter: Payer: Self-pay | Admitting: Internal Medicine

## 2014-12-14 ENCOUNTER — Encounter: Payer: Self-pay | Admitting: Cardiovascular Disease

## 2014-12-14 ENCOUNTER — Ambulatory Visit (INDEPENDENT_AMBULATORY_CARE_PROVIDER_SITE_OTHER): Payer: Medicare Other | Admitting: Cardiovascular Disease

## 2014-12-14 VITALS — BP 122/72 | HR 72 | Ht 69.0 in | Wt 139.4 lb

## 2014-12-14 DIAGNOSIS — E785 Hyperlipidemia, unspecified: Secondary | ICD-10-CM

## 2014-12-14 DIAGNOSIS — I420 Dilated cardiomyopathy: Secondary | ICD-10-CM | POA: Diagnosis not present

## 2014-12-14 DIAGNOSIS — Z79899 Other long term (current) drug therapy: Secondary | ICD-10-CM

## 2014-12-14 DIAGNOSIS — I10 Essential (primary) hypertension: Secondary | ICD-10-CM | POA: Diagnosis not present

## 2014-12-14 MED ORDER — LOSARTAN POTASSIUM 100 MG PO TABS: 100.0000 mg | ORAL_TABLET | Freq: Every day | ORAL | Status: DC

## 2014-12-14 MED ORDER — SPIRONOLACTONE 25 MG PO TABS: 12.5000 mg | ORAL_TABLET | Freq: Every day | ORAL | Status: DC

## 2014-12-14 NOTE — Assessment & Plan Note (Signed)
Well controlled.  Continue current medications and low sodium Dash type diet.    

## 2014-12-14 NOTE — Progress Notes (Signed)
Patient ID: [name], female   DOB: 1956/09/09, 59 y.o.   MRN: [id number] Previous patient of Dr Olevia Perches  History of BMS to circumflex in 2008. EF has been as low as 30-35% but not assessed in the last 3 years. Reviewed cath and she had small diagonal disease as well. Previous LE duplex suggested left SFA occlusion. Plavix apparently causes a rash.  Still smoking   Seeing Dr Ouida Sills with [facility] as primary Compliant with meds.   Gets right hip and buttock pain with walking that is likely claudications  F/U MRI 08/2012   showed inferior scar and EF 29% Seen by Dr Lovena Le and AICD placed 10/21/12  ABI's 12/15 .70 range bilaterally     12/04/14 admitted to hospital with URI/Wheezing  Thought to be COPD exacerbation no signs of CHF and CXR normal  K 3.8 Cr .5  BNP 86   Last echo 05/19/14  Reviewed Study Conclusions  - Left ventricle: The cavity size was mildly dilated. Wall thickness was normal. The estimated ejection fraction was 15%. Diffuse hypokinesis. There is akinesis of the anteroseptal, lateral, inferolateral, and apical myocardium. Doppler parameters are consistent with abnormal left ventricular relaxation (grade 1 diastolic dysfunction). - Aortic valve: There was mild regurgitation. - Mitral valve: There was mild regurgitation. - Left atrium: The atrium was mildly dilated. - Pulmonary arteries: Systolic pressure was mildly increased. PA peak pressure: 39 mm Hg (S).  Impressions:  - Severe LV dysfunction with wall motion abnormalities as outlined; grade 1 diastolic dysfunction; mild LAE; mild MR, TR and AI; mildly elevated pulmonary pressures.  ROS: Denies fever, malais, weight loss, blurry vision, decreased visual acuity, cough, sputum, SOB, hemoptysis, pleuritic pain, palpitaitons, heartburn, abdominal pain, melena, lower extremity edema, claudication, or rash.  All other systems reviewed and negative  General: Affect appropriate Healthy:  appears stated  age 45: normal Neck supple with no adenopathy JVP normal no bruits no thyromegaly Lungs clear with no wheezing and good diaphragmatic motion Heart:  S1/S2 no murmur, no rub, gallop or click PMI normal Abdomen: benighn, BS positve, no tenderness, no AAA no bruit.  No HSM or HJR Distal pulses intact with no bruits No edema Neuro non-focal Skin warm and dry patch on right shoulder No muscular weakness   Current Outpatient Prescriptions  Medication Sig Dispense Refill  . acetaminophen (TYLENOL) 500 MG tablet Take 500 mg by mouth every 6 (six) hours as needed. For headache    . aspirin 325 MG tablet Take 1 tablet (325 mg total) by mouth daily. 30 tablet 0  . atorvastatin (LIPITOR) 10 MG tablet Take 1 tablet (10 mg total) by mouth daily at 6 PM. 30 tablet 11  . carvedilol (COREG) 12.5 MG tablet Take 1 tablet (12.5 mg total) by mouth 2 (two) times daily with a meal. 60 tablet 11  . feeding supplement, ENSURE COMPLETE, (ENSURE COMPLETE) LIQD Take 237 mLs by mouth daily.    Marland Kitchen guaiFENesin (MUCINEX) 600 MG 12 hr tablet Take 2 tablets (1,200 mg total) by mouth 2 (two) times daily as needed for cough or to loosen phlegm. 45 tablet 0  . lisinopril (PRINIVIL,ZESTRIL) 20 MG tablet Take 1 tablet (20 mg total) by mouth daily. 30 tablet 11  . nicotine (NICODERM CQ - DOSED IN MG/24 HR) 7 mg/24hr patch Place 1 patch (7 mg total) onto the skin at bedtime. 28 patch 0  . nitroGLYCERIN (NITROSTAT) 0.4 MG SL tablet Place 1 tablet (0.4 mg total) under the tongue every 5 (five)  minutes as needed for chest pain (up to 3 doses). 25 tablet 4  . OVER THE COUNTER MEDICATION Take 10 mLs by mouth every 4 (four) hours as needed (cough or cold symptoms).    Marland Kitchen PROAIR HFA 108 (90 BASE) MCG/ACT inhaler Inhale 2 puffs into the lungs every 6 (six) hours as needed. Shortness of breath or wheezing     No current facility-administered medications for this visit.    Allergies  Clopidogrel bisulfate  Electrocardiogram:    12/02/14  SR LVH old anterior MI   Assessment and Plan

## 2014-12-14 NOTE — Assessment & Plan Note (Signed)
Recent exacerbation requiring ER visit  Continue mucinex and inhaler  Continue non smoking f/u primary

## 2014-12-14 NOTE — Patient Instructions (Signed)
Your physician wants you to follow-up in:    [place] will receive a reminder letter in the mail two months in advance. If you don't receive a letter, please call our office to schedule the follow-up appointment.  You have been referred to  [facility]  ASAP   Your physician has recommended you make the following change in your medication:  STOP LISINOPRIL  START  LOSARTAN  100 MG  EVERY DAY AND ALDACTONE  12.5 Agency physician recommends that you return for lab work in: BMET IN   [address] [place]   3 WEEKS

## 2014-12-14 NOTE — Assessment & Plan Note (Signed)
I think her coughing is from smoking and COPD with recent exacerbation but can change lisinopril to Cozaar.  Add aldactone low dose With BMET 10 days and 3 weeks  F/U CHF clinic

## 2014-12-14 NOTE — Assessment & Plan Note (Signed)
Stable mild claudication f/u [facility] ABI's in .7 range has not smoked in 3 weeks since ER visit

## 2014-12-14 NOTE — Assessment & Plan Note (Signed)
Cholesterol is at goal.  Continue current dose of statin and diet Rx.  No myalgias or side effects.  F/U  LFT's in 6 months. Lab Results  Component Value Date   LDLCALC 60 05/19/2014

## 2014-12-15 DIAGNOSIS — J441 Chronic obstructive pulmonary disease with (acute) exacerbation: Secondary | ICD-10-CM | POA: Diagnosis not present

## 2014-12-26 ENCOUNTER — Other Ambulatory Visit: Payer: Medicare Other

## 2015-02-27 ENCOUNTER — Encounter (HOSPITAL_COMMUNITY): Payer: Self-pay

## 2015-02-27 ENCOUNTER — Ambulatory Visit (HOSPITAL_COMMUNITY)
Admission: RE | Admit: 2015-02-27 | Discharge: 2015-02-27 | Disposition: A | Discharge status: Home / Self Care | Payer: Medicare Other | Source: Ambulatory Visit | Attending: Internal Medicine | Admitting: Internal Medicine

## 2015-02-27 ENCOUNTER — Other Ambulatory Visit (HOSPITAL_COMMUNITY): Payer: Self-pay | Admitting: *Deleted

## 2015-02-27 VITALS — BP 136/70 | HR 73 | Wt 130.2 lb

## 2015-02-27 DIAGNOSIS — I5022 Chronic systolic (congestive) heart failure: Secondary | ICD-10-CM

## 2015-02-27 DIAGNOSIS — Z79899 Other long term (current) drug therapy: Secondary | ICD-10-CM | POA: Insufficient documentation

## 2015-02-27 DIAGNOSIS — I2510 Atherosclerotic heart disease of native coronary artery without angina pectoris: Secondary | ICD-10-CM | POA: Insufficient documentation

## 2015-02-27 DIAGNOSIS — J449 Chronic obstructive pulmonary disease, unspecified: Secondary | ICD-10-CM | POA: Insufficient documentation

## 2015-02-27 DIAGNOSIS — I255 Ischemic cardiomyopathy: Secondary | ICD-10-CM | POA: Diagnosis not present

## 2015-02-27 DIAGNOSIS — Z7982 Long term (current) use of aspirin: Secondary | ICD-10-CM | POA: Insufficient documentation

## 2015-02-27 DIAGNOSIS — I10 Essential (primary) hypertension: Secondary | ICD-10-CM | POA: Diagnosis not present

## 2015-02-27 DIAGNOSIS — I252 Old myocardial infarction: Secondary | ICD-10-CM | POA: Diagnosis not present

## 2015-02-27 DIAGNOSIS — F17210 Nicotine dependence, cigarettes, uncomplicated: Secondary | ICD-10-CM | POA: Diagnosis not present

## 2015-02-27 DIAGNOSIS — I739 Peripheral vascular disease, unspecified: Secondary | ICD-10-CM | POA: Diagnosis not present

## 2015-02-27 DIAGNOSIS — E785 Hyperlipidemia, unspecified: Secondary | ICD-10-CM | POA: Insufficient documentation

## 2015-02-27 MED ORDER — SACUBITRIL-VALSARTAN 49-51 MG PO TABS: 1.0000 | ORAL_TABLET | Freq: Two times a day (BID) | ORAL | Status: DC

## 2015-02-27 NOTE — Patient Instructions (Signed)
START Entresto 49-51 twice a day.  STOP Losartan.  LABS in 10 days (bmet)  FOLLOW UP in 1 MONTH.  Your Provider has requested you have a stress test.

## 2015-02-27 NOTE — Progress Notes (Signed)
Patient ID: [name], female   DOB: 11-29-1955, 59 y.o.   MRN: [id number] PCP: Dr. Nonda Lou [facility]) Primary Cardiologist: Dr. Johnsie Cancel  59 yo with history of CAD s/p inferoposterior MI in 2008, PAD, COPD, and ischemic cardiomyopathy with chronic systolic CHF presents for CHF evaluation.  She has had no cardiac cath since 2008 at time of MI.  She has PAD with bilateral SFA occlusions followed by Dr [name] Anon.  She actually does not have significant claudication symptoms.  She has had no chest pain.  Last echo in 9/15 showed EF 15% with diffuse hypokinesis and some regionality.  She continues to smoke 2 cigarettes/day and has likely COPD.  She is not short of breath walking on flat ground.  She is mildly short of breath walking up inclines.  No orthopnea, PND, or bendopnea.  No lightheadedness or syncope.    Optivol was checked today: Fluid index > threshold but trending down.  Impedance trending up.   Labs (9/15): LDL 60 Labs (3/16): K 3.8, creatinine 0.51, HCT 45.3, BNP 86  ECG: NSR, nonspecific T wave flattening, PVCs (narrow QRS)  PMH: 1. PAD: 10/15 ABIs 0.76 right, 0.79 left with bilateral SFA occlusions.  Follows with Dr [name] Anon.  2. CAD: Inferoposterior MI in 2008 with BMS to LCx.  Allergic to Plavix (rash).   3. COPD: Active smoker.  4. Ischemic cardiomyopathy:  - cMRI (2/13): Inferior and inferolateral scar, EF 29%  - Medtronic ICD in 2/14.  - Echo (9/15) with EF 15%, diffuse hypokinesis with akinesis of the anteroseptal, lateral and inferolateral walls, mild AI, mild MR, PA systolic pressure 39 mmHg.  5. H/o VT: Has ICD.  6. Hyperlipidemia 7. HTN  SH: Smokes 2 cigs/day, no ETOH, lives in [place]  [place]: CAD  ROS: All systems reviewed and negative except as per HPI.    Current Outpatient Prescriptions  Medication Sig Dispense Refill  . acetaminophen (TYLENOL) 500 MG tablet Take 500 mg by mouth every 6 (six) hours as needed. For headache    . aspirin 81 MG tablet Take 81  mg by mouth daily.    Marland Kitchen atorvastatin (LIPITOR) 10 MG tablet Take 1 tablet (10 mg total) by mouth daily at 6 PM. 30 tablet 11  . carvedilol (COREG) 12.5 MG tablet Take 1 tablet (12.5 mg total) by mouth 2 (two) times daily with a meal. 60 tablet 11  . feeding supplement, ENSURE COMPLETE, (ENSURE COMPLETE) LIQD Take 237 mLs by mouth daily.    Marland Kitchen guaiFENesin (MUCINEX) 600 MG 12 hr tablet Take 2 tablets (1,200 mg total) by mouth 2 (two) times daily as needed for cough or to loosen phlegm. 45 tablet 0  . nitroGLYCERIN (NITROSTAT) 0.4 MG SL tablet Place 1 tablet (0.4 mg total) under the tongue every 5 (five) minutes as needed for chest pain (up to 3 doses). 25 tablet 4  . OVER THE COUNTER MEDICATION Take 10 mLs by mouth every 4 (four) hours as needed (cough or cold symptoms).    Marland Kitchen PROAIR HFA 108 (90 BASE) MCG/ACT inhaler Inhale 2 puffs into the lungs every 6 (six) hours as needed. Shortness of breath or wheezing    . spironolactone (ALDACTONE) 25 MG tablet Take 25 mg by mouth daily.    . sacubitril-valsartan (ENTRESTO) 49-51 MG Take 1 tablet by mouth 2 (two) times daily. 60 tablet 3   No current facility-administered medications for this encounter.   BP 136/70 mmHg  Pulse 73  Wt 130 lb 4 oz (59.081  kg)  SpO2 96% General: NAD Neck: No JVD, no thyromegaly or thyroid nodule.  Lungs: Clear to auscultation bilaterally with normal respiratory effort. CV: Nondisplaced PMI.  Heart regular S1/S2, no S3/S4, no murmur.  No peripheral edema.  No carotid bruit.  1+ PT pulses.  Abdomen: Soft, nontender, no hepatosplenomegaly, no distention.  Skin: Intact without lesions or rashes.  Neurologic: Alert and oriented x 3.  Psych: Normal affect. Extremities: No clubbing or cyanosis.  HEENT: Normal.   Assessment/Plan: 1. Chronic systolic CHF: Ischemic CMP.  Medtronic ICD.  NYHA class II symptoms and euvolemic on exam.  Optivol fluid index > threshold but impedance trending up.  By exam and symptoms, does not seem  volume overloaded.  Last echo in 9/15 with EF 15%.   - No recent ischemic evaluation.  Given fall in EF on 9/15 echo, would get ETT-Cardiolite to assess for any ischemia.  - I am not going to put her on lasix at this time.  Will need to follow closely.  - Continue current spironolactone and Coreg.  - Stop losartan and start Entresto 49/51 bid.  BMET in 10 days.  - I will arrange for CPX once I have optimized her medications.  2. CAD: S/p inferoposterior MI with PCI in 2008.  Continue statin and ASA 81.  As above, given fall in EF will arrange for Cardiolite.   3. PAD: Bilateral SFA occlusions on arterial duplex in 10/15. No significant claudication.  Has followup with Dr [name] Anon.  4. Smoking: Active smoker but only 2 cigs/day.  Suspect COPD.  I strongly recommended that she quit smoking.   Loralie Champagne 02/27/2015

## 2015-02-28 ENCOUNTER — Telehealth: Payer: Self-pay

## 2015-02-28 NOTE — Telephone Encounter (Signed)
Prior auth sent to [facility] for Entresto 49-51 via Cover My Meds.

## 2015-03-01 ENCOUNTER — Telehealth: Payer: Self-pay

## 2015-03-01 ENCOUNTER — Ambulatory Visit (INDEPENDENT_AMBULATORY_CARE_PROVIDER_SITE_OTHER): Payer: Medicare Other | Admitting: *Deleted

## 2015-03-01 DIAGNOSIS — Z95810 Presence of automatic (implantable) cardiac defibrillator: Secondary | ICD-10-CM

## 2015-03-01 DIAGNOSIS — I420 Dilated cardiomyopathy: Secondary | ICD-10-CM | POA: Diagnosis not present

## 2015-03-01 DIAGNOSIS — I5022 Chronic systolic (congestive) heart failure: Secondary | ICD-10-CM | POA: Diagnosis not present

## 2015-03-01 LAB — CUP PACEART INCLINIC DEVICE CHECK
Battery Voltage: 3.01 V
Brady Statistic RV Percent Paced: 0.03 %
Date Time Interrogation Session: 20160622141140
HIGH POWER IMPEDANCE MEASURED VALUE: 190 Ohm
HIGH POWER IMPEDANCE MEASURED VALUE: 75 Ohm
Lead Channel Impedance Value: 513 Ohm
Lead Channel Pacing Threshold Amplitude: 0.5 V
Lead Channel Sensing Intrinsic Amplitude: 9.25 mV
Lead Channel Setting Pacing Amplitude: 2.5 V
Lead Channel Setting Pacing Pulse Width: 0.4 ms
MDC IDC MSMT BATTERY REMAINING LONGEVITY: 123 mo
MDC IDC MSMT LEADCHNL RV PACING THRESHOLD PULSEWIDTH: 0.4 ms
MDC IDC MSMT LEADCHNL RV SENSING INTR AMPL: 10.125 mV
MDC IDC SET LEADCHNL RV SENSING SENSITIVITY: 0.3 mV
MDC IDC SET ZONE DETECTION INTERVAL: 360 ms
MDC IDC SET ZONE DETECTION INTERVAL: 400 ms
Zone Setting Detection Interval: 290 ms

## 2015-03-01 NOTE — Telephone Encounter (Signed)
Entresto approved through 02/28/2016 from [facility]. Reference PA [id number]. Pharmacy notified.

## 2015-03-01 NOTE — Progress Notes (Signed)
ICD check in clinic. Normal device function. Threshold and sensing consistent with previous device measurements. Impedance trends stable over time. No evidence of any ventricular arrhythmias. Histogram distribution appropriate for patient and level of activity. No changes made this session. Device programmed at appropriate safety margins. Device programmed to optimize intrinsic conduction. OptiVol abnormal since 02/11/15---pt asymptomatic. Estimated longevity 10.84yrs. Alert tone not demonstrated for patient, pt knows to call clinic if heard. ROV w/ device clinic 06/01/15 & w/ GT 3/17.

## 2015-03-08 ENCOUNTER — Ambulatory Visit (HOSPITAL_COMMUNITY)
Admission: RE | Admit: 2015-03-08 | Discharge: 2015-03-08 | Disposition: A | Discharge status: Home / Self Care | Payer: Medicare Other | Source: Ambulatory Visit | Attending: Cardiology | Admitting: Cardiology

## 2015-03-08 DIAGNOSIS — I10 Essential (primary) hypertension: Secondary | ICD-10-CM | POA: Diagnosis not present

## 2015-03-08 DIAGNOSIS — I5022 Chronic systolic (congestive) heart failure: Secondary | ICD-10-CM

## 2015-03-08 LAB — BASIC METABOLIC PANEL
ANION GAP: 8 (ref 5–15)
BUN: 7 mg/dL (ref 6–20)
CALCIUM: 9 mg/dL (ref 8.9–10.3)
CO2: 25 mmol/L (ref 22–32)
CREATININE: 0.6 mg/dL (ref 0.44–1.00)
Chloride: 107 mmol/L (ref 101–111)
GFR calc Af Amer: >60 mL/min (ref >60)
Glucose, Bld: 81 mg/dL (ref 65–99)
Potassium: 4.9 mmol/L (ref 3.5–5.1)
SODIUM: 140 mmol/L (ref 135–145)

## 2015-03-14 ENCOUNTER — Encounter: Payer: Self-pay | Admitting: Cardiology

## 2015-03-15 ENCOUNTER — Telehealth (HOSPITAL_COMMUNITY): Payer: Self-pay | Admitting: Cardiology

## 2015-03-15 NOTE — Telephone Encounter (Signed)
Pt scheduled for myoview 03/21/15 per Dr.McLean Cpt code# 91504 With pts current insurance- Medicare/ Medicaid NPCR

## 2015-03-16 ENCOUNTER — Telehealth (HOSPITAL_COMMUNITY): Payer: Self-pay | Admitting: *Deleted

## 2015-03-16 NOTE — Telephone Encounter (Signed)
Left message on voicemail in reference to upcoming appointment scheduled for 03/21/15. Phone number given for a call back so details instructions can be given. Hubbard Robinson, RN

## 2015-03-17 ENCOUNTER — Encounter: Payer: Self-pay | Admitting: Internal Medicine

## 2015-03-17 ENCOUNTER — Encounter: Payer: Self-pay | Admitting: Cardiology

## 2015-03-20 ENCOUNTER — Telehealth (HOSPITAL_COMMUNITY): Payer: Self-pay | Admitting: *Deleted

## 2015-03-20 NOTE — Telephone Encounter (Signed)
Patient given detailed instructions per Myocardial Perfusion Study Information Sheet for test on 03/21/15 at 0745. Patient Notified to arrive 15 minutes early, and that it is imperative to arrive on time for appointment to keep from having the test rescheduled. Patient verbalized understanding. [name], [name]

## 2015-03-21 ENCOUNTER — Ambulatory Visit (HOSPITAL_COMMUNITY): Payer: Medicare Other | Attending: Cardiology

## 2015-03-21 DIAGNOSIS — R9439 Abnormal result of other cardiovascular function study: Secondary | ICD-10-CM | POA: Insufficient documentation

## 2015-03-21 DIAGNOSIS — I5022 Chronic systolic (congestive) heart failure: Secondary | ICD-10-CM | POA: Diagnosis not present

## 2015-03-21 LAB — MYOCARDIAL PERFUSION IMAGING
CHL CUP RESTING HR STRESS: 57 {beats}/min
LV dias vol: 116 mL
LV sys vol: 58 mL
NUC STRESS TID: 0.86
Peak HR: 86 {beats}/min
RATE: 0.27
SDS: 1
SRS: 13
SSS: 14

## 2015-03-21 MED ORDER — TECHNETIUM TC 99M SESTAMIBI GENERIC - CARDIOLITE
30.7000 | Freq: Once | INTRAVENOUS | Status: AC | PRN
  Administered 2015-03-21: 30.7 via INTRAVENOUS

## 2015-03-21 MED ORDER — TECHNETIUM TC 99M SESTAMIBI GENERIC - CARDIOLITE
10.1000 | Freq: Once | INTRAVENOUS | Status: AC | PRN
  Administered 2015-03-21: 10.1 via INTRAVENOUS

## 2015-03-21 MED ORDER — REGADENOSON 0.4 MG/5ML IV SOLN
0.4000 mg | Freq: Once | INTRAVENOUS | Status: AC
  Administered 2015-03-21: 0.4 mg via INTRAVENOUS

## 2015-03-22 ENCOUNTER — Encounter: Payer: Self-pay | Admitting: Cardiology

## 2015-03-27 ENCOUNTER — Telehealth (HOSPITAL_COMMUNITY): Payer: Self-pay

## 2015-03-27 NOTE — Telephone Encounter (Signed)
ECHO results reviewed by Dr. Aundra Dubin, EF improved, no cath needed per [facility], may need repeat to confirm EF.  Attempted to call results, no answer. Pt has apt with our clinic this week, will add apt note to discuss results at apt if no call back.  Renee Pain

## 2015-03-30 ENCOUNTER — Encounter (HOSPITAL_COMMUNITY): Payer: Self-pay

## 2015-03-30 ENCOUNTER — Ambulatory Visit (HOSPITAL_COMMUNITY)
Admission: RE | Admit: 2015-03-30 | Discharge: 2015-03-30 | Disposition: A | Discharge status: Home / Self Care | Payer: Medicare Other | Source: Ambulatory Visit | Attending: Internal Medicine | Admitting: Internal Medicine

## 2015-03-30 VITALS — BP 134/88 | HR 68 | Wt 133.8 lb

## 2015-03-30 DIAGNOSIS — I739 Peripheral vascular disease, unspecified: Secondary | ICD-10-CM | POA: Diagnosis not present

## 2015-03-30 DIAGNOSIS — I2510 Atherosclerotic heart disease of native coronary artery without angina pectoris: Secondary | ICD-10-CM | POA: Diagnosis not present

## 2015-03-30 DIAGNOSIS — Z720 Tobacco use: Secondary | ICD-10-CM | POA: Diagnosis not present

## 2015-03-30 DIAGNOSIS — I5022 Chronic systolic (congestive) heart failure: Secondary | ICD-10-CM | POA: Diagnosis not present

## 2015-03-30 MED ORDER — SACUBITRIL-VALSARTAN 97-103 MG PO TABS: 1.0000 | ORAL_TABLET | Freq: Two times a day (BID) | ORAL | Status: DC

## 2015-03-30 NOTE — Progress Notes (Signed)
Patient ID: [name], female   DOB: 1956/04/22, 59 y.o.   MRN: [id number]  PCP: Dr. Nonda Lou [facility]) Primary Cardiologist: Dr. Johnsie Cancel HF: Dr. Aundra Dubin  59 yo with history of CAD s/p inferoposterior MI in 2008, PAD, COPD, and ischemic cardiomyopathy with chronic systolic CHF.  She has had no cardiac cath since 2008 at time of MI.  She has PAD with bilateral SFA occlusions followed by Dr [name] Anon.    EF previously 15% but Myoview 7/16 with EF 50%.  She reports today for heart failure follow up.  She had a recent myoview that showed large improvement of her EF since echo in 9/15.  Denies any claudications symptoms. Denies chest pain. She continues to smoke 4-5 cigarettes/day and has likely COPD, would be interested in using medication. She says she tried the patch with no success.  She denies any SOB, even on hills. Has moved and no longer has steps at her house, so is unsure how those would effect.  No orthopnea, PND, or bendopnea.  No lightheadedness or syncope.    Optivol was checked today: not working  Labs (9/15): LDL 60 Labs (3/16): K 3.8, creatinine 0.51, HCT 45.3, BNP 86 Labs (6/16) K 4.9, creatinine 0.60   PMH: 1. PAD: 10/15 ABIs 0.76 right, 0.79 left with bilateral SFA occlusions.  Follows with Dr [name] Anon.  2. CAD: Inferoposterior MI in 2008 with BMS to LCx.  Allergic to Plavix (rash).   3. COPD: Active smoker.  4. Ischemic cardiomyopathy:  - cMRI (2/13): Inferior and inferolateral scar, EF 29%  - Medtronic ICD in 2/14.  - Echo (9/15) with EF 15%, diffuse hypokinesis with akinesis of the anteroseptal, lateral and inferolateral walls, mild AI, mild MR, PA systolic pressure 39 mmHg.  - Nuclear stress 03/21/15 EF 50%, low risk study. 5. H/o VT: Has ICD.  6. Hyperlipidemia 7. HTN  SH: Smokes 4-5 cigs/day, no ETOH, lives in [place]  [place]: CAD  ROS: All systems reviewed and negative except as per HPI.    Current Outpatient Prescriptions  Medication Sig Dispense Refill   . acetaminophen (TYLENOL) 500 MG tablet Take 500 mg by mouth every 6 (six) hours as needed. For headache    . aspirin 81 MG tablet Take 81 mg by mouth daily.    Marland Kitchen atorvastatin (LIPITOR) 10 MG tablet Take 1 tablet (10 mg total) by mouth daily at 6 PM. 30 tablet 11  . carvedilol (COREG) 12.5 MG tablet Take 1 tablet (12.5 mg total) by mouth 2 (two) times daily with a meal. 60 tablet 11  . feeding supplement, ENSURE COMPLETE, (ENSURE COMPLETE) LIQD Take 237 mLs by mouth daily.    Marland Kitchen guaiFENesin (MUCINEX) 600 MG 12 hr tablet Take 2 tablets (1,200 mg total) by mouth 2 (two) times daily as needed for cough or to loosen phlegm. 45 tablet 0  . nitroGLYCERIN (NITROSTAT) 0.4 MG SL tablet Place 1 tablet (0.4 mg total) under the tongue every 5 (five) minutes as needed for chest pain (up to 3 doses). 25 tablet 4  . OVER THE COUNTER MEDICATION Take 10 mLs by mouth every 4 (four) hours as needed (cough or cold symptoms).    Marland Kitchen PROAIR HFA 108 (90 BASE) MCG/ACT inhaler Inhale 2 puffs into the lungs every 6 (six) hours as needed. Shortness of breath or wheezing    . sacubitril-valsartan (ENTRESTO) 49-51 MG Take 1 tablet by mouth 2 (two) times daily. 60 tablet 3  . spironolactone (ALDACTONE) 25 MG tablet Take 25  mg by mouth daily.     No current facility-administered medications for this encounter.   BP 134/88 mmHg  Pulse 68  Wt 133 lb 12.8 oz (60.691 kg)  SpO2 99% General: NAD Neck: No JVD, no thyromegaly or thyroid nodule.  Lungs: Clear to auscultation bilaterally with normal respiratory effort. CV: Nondisplaced PMI.  Heart regular S1/S2, no S3/S4, no murmur.  No peripheral edema.  No carotid bruit.  1+ PT pulses.  Abdomen: Soft, nontender, no hepatosplenomegaly, no distention.  Skin: Intact without lesions or rashes.  Neurologic: Alert and oriented x 3.  Psych: Normal affect. Extremities: No clubbing or cyanosis.  HEENT: Normal.   Assessment/Plan: 1. Chronic systolic CHF: Ischemic CMP.  Medtronic ICD.   NYHA class II symptoms and euvolemic on exam.  Optivol fluid index > threshold but impedance trending up.  By exam and symptoms, does not seem volume overloaded. Stress test shows EF 50% 03/21/15 Last echo in 9/15 with EF 15%.   - ETT-Cardiolite shows improved EF to 50% from 15% on ECHO 9/15. Cardiolite was low risk study with no ST deviation. - Will repeat echo to confirm improved EF. - Not fluid overloaded and good EF on Myoview.  No need for diuresis currently but will follow closely.  - Continue current spironolactone and Coreg.  - Continue Entresto 49/51 bid.   - CPX once medications optimized. 2. CAD: S/p inferoposterior MI with PCI in 2008.  Continue statin and ASA 81.  Myoview ok. 3. PAD: Bilateral SFA occlusions on arterial duplex in 10/15. No significant claudication.  Has followup with Dr [name] Anon. Needs to stop smoking.  4. Smoking: Active smoker but only 4-5 cigs/day.  Suspect COPD.  Strongly recommend cessation. Would consider medication.  Patches have not helped her.  59 Friar PA-C 03/30/2015  Patient seen and examined with Oda Kilts, PA-C. We discussed all aspects of the encounter. I agree with the assessment and plan as stated above.   Doing very well from a cardiac perspective. Myoview with improved EF and no significant ischemia. Will get echo to confirm. Volume status looks good. NYHA I. Will increase Entresto to 97/103 bid. Check in labs in 1 week. Counseled on need to stop smoking. Consider increasing atorva as tolerated.   Kratos Ruscitti,MD 10:54 AM

## 2015-03-30 NOTE — Addendum Note (Signed)
Encounter addended by: Kerry Dory, CMA on: 03/30/2015 11:16 AM<BR>     Documentation filed: Medications, Visit Diagnoses, Dx Association, Patient Instructions Section, Orders

## 2015-03-30 NOTE — Patient Instructions (Signed)
INCREASE Entresto to 97/103 mg, one tab twice a day  Labs needed in one week (BMET)  Your physician has requested that you have an echocardiogram. Echocardiography is a painless test that uses sound waves to create images of your heart. It provides your doctor with information about the size and shape of your heart and how well your heart's chambers and valves are working. This procedure takes approximately one hour. There are no restrictions for this procedure.  Your physician recommends that you schedule a follow-up appointment in: 6 months  Do the following things EVERYDAY: 1) Weigh yourself in the morning before breakfast. Write it down and keep it in a log. 2) Take your medicines as prescribed 3) Eat low salt foods-Limit salt (sodium) to 2000 mg per day.  4) Stay as active as you can everyday 5) Limit all fluids for the day to less than 2 liters 6)

## 2015-04-05 ENCOUNTER — Ambulatory Visit (HOSPITAL_COMMUNITY)
Admission: RE | Admit: 2015-04-05 | Discharge: 2015-04-05 | Disposition: A | Discharge status: Home / Self Care | Payer: Medicare Other | Source: Ambulatory Visit | Attending: Internal Medicine | Admitting: Internal Medicine

## 2015-04-05 DIAGNOSIS — I5022 Chronic systolic (congestive) heart failure: Secondary | ICD-10-CM | POA: Insufficient documentation

## 2015-04-05 LAB — BASIC METABOLIC PANEL
ANION GAP: 7 (ref 5–15)
CHLORIDE: 108 mmol/L (ref 101–111)
CO2: 24 mmol/L (ref 22–32)
CREATININE: 0.67 mg/dL (ref 0.44–1.00)
Calcium: 9 mg/dL (ref 8.9–10.3)
GFR calc Af Amer: >60 mL/min (ref >60)
Glucose, Bld: 101 mg/dL — ABNORMAL HIGH (ref 65–99)
Potassium: 4.2 mmol/L (ref 3.5–5.1)
SODIUM: 139 mmol/L (ref 135–145)

## 2015-04-12 ENCOUNTER — Ambulatory Visit (HOSPITAL_COMMUNITY)
Admission: RE | Admit: 2015-04-12 | Discharge: 2015-04-12 | Disposition: A | Discharge status: Home / Self Care | Payer: Medicare Other | Source: Ambulatory Visit | Attending: Internal Medicine | Admitting: Internal Medicine

## 2015-04-12 DIAGNOSIS — I5022 Chronic systolic (congestive) heart failure: Secondary | ICD-10-CM

## 2015-04-12 DIAGNOSIS — I340 Nonrheumatic mitral (valve) insufficiency: Secondary | ICD-10-CM | POA: Insufficient documentation

## 2015-04-12 DIAGNOSIS — I10 Essential (primary) hypertension: Secondary | ICD-10-CM | POA: Insufficient documentation

## 2015-04-12 DIAGNOSIS — F17200 Nicotine dependence, unspecified, uncomplicated: Secondary | ICD-10-CM | POA: Diagnosis not present

## 2015-04-12 DIAGNOSIS — I509 Heart failure, unspecified: Secondary | ICD-10-CM | POA: Diagnosis present

## 2015-04-12 DIAGNOSIS — I071 Rheumatic tricuspid insufficiency: Secondary | ICD-10-CM | POA: Insufficient documentation

## 2015-04-12 DIAGNOSIS — I371 Nonrheumatic pulmonary valve insufficiency: Secondary | ICD-10-CM | POA: Insufficient documentation

## 2015-04-12 DIAGNOSIS — I351 Nonrheumatic aortic (valve) insufficiency: Secondary | ICD-10-CM | POA: Diagnosis not present

## 2015-04-12 NOTE — Progress Notes (Signed)
  Echocardiogram 2D Echocardiogram has been performed.  [name] 04/12/2015, 11:15 AM

## 2015-06-01 ENCOUNTER — Ambulatory Visit (INDEPENDENT_AMBULATORY_CARE_PROVIDER_SITE_OTHER): Payer: Medicare Other | Admitting: *Deleted

## 2015-06-01 DIAGNOSIS — Z95810 Presence of automatic (implantable) cardiac defibrillator: Secondary | ICD-10-CM | POA: Diagnosis not present

## 2015-06-01 DIAGNOSIS — I5022 Chronic systolic (congestive) heart failure: Secondary | ICD-10-CM | POA: Diagnosis not present

## 2015-06-01 LAB — CUP PACEART INCLINIC DEVICE CHECK
Battery Voltage: 3.01 V
Date Time Interrogation Session: 20160922115846
HIGH POWER IMPEDANCE MEASURED VALUE: 190 Ohm
HighPow Impedance: 73 Ohm
Lead Channel Impedance Value: 532 Ohm
Lead Channel Pacing Threshold Amplitude: 0.5 V
Lead Channel Pacing Threshold Pulse Width: 0.4 ms
Lead Channel Setting Pacing Pulse Width: 0.4 ms
Lead Channel Setting Sensing Sensitivity: 0.3 mV
MDC IDC MSMT BATTERY REMAINING LONGEVITY: 121 mo
MDC IDC MSMT LEADCHNL RV SENSING INTR AMPL: 11.625 mV
MDC IDC SET LEADCHNL RV PACING AMPLITUDE: 2.5 V
MDC IDC SET ZONE DETECTION INTERVAL: 290 ms
MDC IDC SET ZONE DETECTION INTERVAL: 360 ms
MDC IDC SET ZONE DETECTION INTERVAL: 400 ms
MDC IDC STAT BRADY RV PERCENT PACED: 0.01 %

## 2015-06-01 NOTE — Progress Notes (Signed)
ICD check in clinic. Normal device function. Threshold and sensing consistent with previous device measurements. Impedance trends stable over time. No evidence of any ventricular arrhythmias. Histogram distribution appropriate for patient and level of activity. No changes made this session. Device programmed at appropriate safety margins. Device programmed to optimize intrinsic conduction. Estimated longevity 10.1  years. Pt enrolled in remote follow-up- monitor and wirex ordered. [facility] 08/31/15, ROV with [facility] in March.

## 2015-06-27 ENCOUNTER — Encounter: Payer: Self-pay | Admitting: Internal Medicine

## 2015-07-04 ENCOUNTER — Encounter: Payer: Self-pay | Admitting: Internal Medicine

## 2015-07-20 ENCOUNTER — Encounter: Payer: Self-pay | Admitting: *Deleted

## 2015-07-27 NOTE — Progress Notes (Signed)
Patient ID: AZARIE MARGOLIS, female   DOB: 06-02-1956, 59 y.o.   MRN: [id number]  PCP: Dr. Nonda Lou [facility]) Primary Cardiologist: Dr. Johnsie Cancel HF: Dr. Aundra Dubin  59 y.o.  with history of CAD s/p inferoposterior MI in 2008, PAD, COPD, and ischemic cardiomyopathy with chronic systolic CHF.  She has had no cardiac cath since 2008 at time of MI.  She has PAD with bilateral SFA occlusions followed by Dr [name] Anon.    EF previously 15% but Myoview 7/16 with EF 50%.  She had a recent myoview that showed large improvement of her EF since echo in 9/15.  Denies any claudications symptoms. Denies chest pain. She continues to smoke 4-5 cigarettes/day and has likely COPD, would be interested in using medication. She says she tried the patch with no success.  She denies any SOB, even on hills. Has moved and no longer has steps at her house, so is unsure how those would effect.  No orthopnea, PND, or bendopnea.  No lightheadedness or syncope.    No CXR in a long time and still smoking Some weight loss Taking Ensure  Lab Results  Component Value Date   CREATININE 0.67 04/05/2015   BUN <5* 04/05/2015   NA 139 04/05/2015   K 4.2 04/05/2015   CL 108 04/05/2015   CO2 24 04/05/2015   Lab Results  Component Value Date   LDLCALC 60 05/19/2014      PMH: 1. PAD: 10/15 ABIs 0.76 right, 0.79 left with bilateral SFA occlusions.  Follows with Dr [name] Anon.  2. CAD: Inferoposterior MI in 2008 with BMS to LCx.  Allergic to Plavix (rash).   3. COPD: Active smoker.  4. Ischemic cardiomyopathy:  - cMRI (2/13): Inferior and inferolateral scar, EF 29%  - Medtronic ICD in 2/14.  - Echo (9/15) with EF 15%, diffuse hypokinesis with akinesis of the anteroseptal, lateral and inferolateral walls, mild AI, mild MR, PA systolic pressure 39 mmHg.  - Nuclear stress 03/21/15 EF 50%, low risk study. 5. H/o VT: Has ICD.  6. Hyperlipidemia 7. HTN  SH: Smokes 4-5 cigs/day, no ETOH, lives in [place]  [place]: CAD  ROS: All  systems reviewed and negative except as per HPI.    Current Outpatient Prescriptions  Medication Sig Dispense Refill  . acetaminophen (TYLENOL) 500 MG tablet Take 500 mg by mouth every 6 (six) hours as needed for headache.     Marland Kitchen aspirin 81 MG tablet Take 81 mg by mouth daily.    Marland Kitchen atorvastatin (LIPITOR) 10 MG tablet Take 1 tablet (10 mg total) by mouth daily at 6 PM. 30 tablet 11  . budesonide-formoterol (SYMBICORT) 160-4.5 MCG/ACT inhaler Inhale 2 puffs into the lungs 2 (two) times daily.    . carvedilol (COREG) 12.5 MG tablet Take 1 tablet (12.5 mg total) by mouth 2 (two) times daily with a meal. 60 tablet 11  . feeding supplement, ENSURE COMPLETE, (ENSURE COMPLETE) LIQD Take 237 mLs by mouth daily.    Marland Kitchen guaiFENesin (MUCINEX) 600 MG 12 hr tablet Take 2 tablets (1,200 mg total) by mouth 2 (two) times daily as needed for cough or to loosen phlegm. 45 tablet 0  . ipratropium (ATROVENT) 0.02 % nebulizer solution Inhale 0.5 mg into the lungs 4 (four) times daily as needed. wheezing    . losartan (COZAAR) 100 MG tablet Take 100 mg by mouth daily.    . nitroGLYCERIN (NITROSTAT) 0.4 MG SL tablet Place 1 tablet (0.4 mg total) under the tongue every 5 (five) minutes  as needed for chest pain (up to 3 doses). 25 tablet 4  . OVER THE COUNTER MEDICATION Take 10 mLs by mouth every 4 (four) hours as needed (cough or cold symptoms).    Marland Kitchen PROAIR HFA 108 (90 BASE) MCG/ACT inhaler Inhale 2 puffs into the lungs every 6 (six) hours as needed. Shortness of breath or wheezing    . sacubitril-valsartan (ENTRESTO) 97-103 MG Take 1 tablet by mouth 2 (two) times daily. 60 tablet 6  . spironolactone (ALDACTONE) 25 MG tablet Take 25 mg by mouth daily.     No current facility-administered medications for this visit.   BP 108/56 mmHg  Pulse 91  Ht 5' 9.5" (1.765 m)  Wt 59.875 kg (132 lb)  BMI 19.22 kg/m2  SpO2 99% General: NAD Neck: No JVD, no thyromegaly or thyroid nodule.  Lungs: Clear to auscultation bilaterally  with normal respiratory effort. CV: Nondisplaced PMI.  Heart regular S1/S2, no S3/S4, no murmur.  No peripheral edema.  No carotid bruit.  1+ PT pulses.  Abdomen: Soft, nontender, no hepatosplenomegaly, no distention.  Skin: Intact without lesions or rashes.  Neurologic: Alert and oriented x 3.  Psych: Normal affect. Extremities: No clubbing or cyanosis.  HEENT: Normal.    ECG:  12/02/14  SR rate 68 PVC ICLBBB  Assessment/Plan: 1. Chronic systolic CHF: Ischemic CMP.  Medtronic ICD.  NYHA class II symptoms and euvolemic on exam.  Optivol fluid index > threshold but impedance trending up.  By exam and symptoms, does not seem volume overloaded. Stress test shows EF 50% 03/21/15 Last echo in 9/15 with EF 15%.   - ETT-Cardiolite shows improved EF to 50% from 15% on ECHO 9/15. Cardiolite was low risk study with no ST deviation. - Not fluid overloaded and good EF on Myoview.  No need for diuresis currently but will follow closely.  - Continue current spironolactone and Coreg.  - Continue Entresto 49/51 bid.   - CPX once medications optimized. 2. CAD: S/p inferoposterior MI with PCI in 2008.  Continue statin and ASA 81.  Myoview ok. 3. PAD: Bilateral SFA occlusions on arterial duplex in 10/15. No significant claudication.  Has followup with Dr [name] Anon. Needs to stop smoking.  4. Smoking: Active smoker but only 4-5 cigs/day.  Suspect COPD.  Strongly recommend cessation. Would consider medication.  Patches have not helped her. Will get CXR today given weight loss and ongoing smoking   Jenkins Rouge

## 2015-07-28 ENCOUNTER — Encounter: Payer: Self-pay | Admitting: Cardiovascular Disease

## 2015-07-28 ENCOUNTER — Ambulatory Visit (INDEPENDENT_AMBULATORY_CARE_PROVIDER_SITE_OTHER): Payer: Medicare Other | Admitting: Cardiovascular Disease

## 2015-07-28 VITALS — BP 108/56 | HR 91 | Ht 69.5 in | Wt 132.0 lb

## 2015-07-28 DIAGNOSIS — Z720 Tobacco use: Secondary | ICD-10-CM | POA: Diagnosis not present

## 2015-07-28 DIAGNOSIS — I2510 Atherosclerotic heart disease of native coronary artery without angina pectoris: Secondary | ICD-10-CM | POA: Diagnosis not present

## 2015-07-28 DIAGNOSIS — F17200 Nicotine dependence, unspecified, uncomplicated: Secondary | ICD-10-CM

## 2015-07-28 DIAGNOSIS — R0600 Dyspnea, unspecified: Secondary | ICD-10-CM | POA: Diagnosis not present

## 2015-07-28 NOTE — Patient Instructions (Signed)
Medication Instructions:  Your physician recommends that you continue on your current medications as directed. Please refer to the Current Medication list given to you today.   Labwork: NONE  Testing/Procedures: A chest x-ray takes a picture of the organs and structures inside the chest, including the heart, lungs, and blood vessels. This test can show several things, including, whether the heart is enlarges; whether fluid is building up in the lungs; and whether pacemaker / defibrillator leads are still in place.   Follow-Up: Your physician wants you to follow-up in:    [place] will receive a reminder letter in the mail two months in advance. If you don't receive a letter, please call our office to schedule the follow-up appointment.   Any Other Special Instructions Will Be Listed Below (If Applicable).     If you need a refill on your cardiac medications before your next appointment, please call your pharmacy.

## 2015-07-29 ENCOUNTER — Ambulatory Visit (HOSPITAL_COMMUNITY)
Admission: RE | Admit: 2015-07-29 | Discharge: 2015-07-29 | Disposition: A | Discharge status: Home / Self Care | Payer: Medicare Other | Source: Ambulatory Visit | Attending: Cardiovascular Disease | Admitting: Cardiovascular Disease

## 2015-07-29 DIAGNOSIS — R634 Abnormal weight loss: Secondary | ICD-10-CM | POA: Insufficient documentation

## 2015-07-29 DIAGNOSIS — F17200 Nicotine dependence, unspecified, uncomplicated: Secondary | ICD-10-CM

## 2015-07-29 DIAGNOSIS — R0600 Dyspnea, unspecified: Secondary | ICD-10-CM | POA: Diagnosis not present

## 2015-07-29 DIAGNOSIS — Z720 Tobacco use: Secondary | ICD-10-CM | POA: Insufficient documentation

## 2015-08-01 ENCOUNTER — Ambulatory Visit (INDEPENDENT_AMBULATORY_CARE_PROVIDER_SITE_OTHER): Payer: Medicare Other | Admitting: Cardiovascular Disease

## 2015-08-01 ENCOUNTER — Encounter: Payer: Self-pay | Admitting: Cardiovascular Disease

## 2015-08-01 VITALS — BP 110/62 | HR 68 | Ht 69.5 in | Wt 130.8 lb

## 2015-08-01 DIAGNOSIS — Z720 Tobacco use: Secondary | ICD-10-CM | POA: Diagnosis not present

## 2015-08-01 DIAGNOSIS — I2510 Atherosclerotic heart disease of native coronary artery without angina pectoris: Secondary | ICD-10-CM

## 2015-08-01 DIAGNOSIS — F17200 Nicotine dependence, unspecified, uncomplicated: Secondary | ICD-10-CM

## 2015-08-01 DIAGNOSIS — F17210 Nicotine dependence, cigarettes, uncomplicated: Secondary | ICD-10-CM | POA: Diagnosis not present

## 2015-08-01 DIAGNOSIS — I739 Peripheral vascular disease, unspecified: Secondary | ICD-10-CM

## 2015-08-01 MED ORDER — NICOTINE 14 MG/24HR TD PT24: 14.0000 mg | MEDICATED_PATCH | Freq: Every day | TRANSDERMAL | Status: DC

## 2015-08-01 MED ORDER — NICOTINE 7 MG/24HR TD PT24: 7.0000 mg | MEDICATED_PATCH | Freq: Every day | TRANSDERMAL | Status: DC

## 2015-08-01 NOTE — Progress Notes (Signed)
Primary cardiologist: Dr. Johnsie Cancel  HPI  This is a 59 year old African American female who is here today for a follow up visit regarding  peripheral arterial disease. The patient has extensive cardiac history including CAD with previous stenting as well as chronic systolic heart failure with severely reduced LV systolic function. cardiac MRI in 2013 showed an ejection fraction of 28%. The patient has an ICD. Most recent echo showed an ejection fraction of 50-55%. She is known to have occluded bilateral SFA for many years . She describes prolonged history of bilateral calf discomfort with moderate walking. ABI last year was 0.79 bilaterally.  Her claudication has been stable with no worsening. She quit smoking briefly last year but relapsed. She is currently smoking 4-5 cigarettes a day.    Allergies  Allergen Reactions  . Clopidogrel Bisulfate Rash     Current Outpatient Prescriptions on File Prior to Visit  Medication Sig Dispense Refill  . acetaminophen (TYLENOL) 500 MG tablet Take 500 mg by mouth every 6 (six) hours as needed for headache.     Marland Kitchen aspirin 81 MG tablet Take 81 mg by mouth daily.    Marland Kitchen atorvastatin (LIPITOR) 10 MG tablet Take 1 tablet (10 mg total) by mouth daily at 6 PM. 30 tablet 11  . budesonide-formoterol (SYMBICORT) 160-4.5 MCG/ACT inhaler Inhale 2 puffs into the lungs 2 (two) times daily.    . carvedilol (COREG) 12.5 MG tablet Take 1 tablet (12.5 mg total) by mouth 2 (two) times daily with a meal. 60 tablet 11  . feeding supplement, ENSURE COMPLETE, (ENSURE COMPLETE) LIQD Take 237 mLs by mouth daily.    Marland Kitchen guaiFENesin (MUCINEX) 600 MG 12 hr tablet Take 2 tablets (1,200 mg total) by mouth 2 (two) times daily as needed for cough or to loosen phlegm. 45 tablet 0  . ipratropium (ATROVENT) 0.02 % nebulizer solution Inhale 0.5 mg into the lungs 4 (four) times daily as needed. wheezing    . losartan (COZAAR) 100 MG tablet Take 100 mg by mouth daily.    . nitroGLYCERIN  (NITROSTAT) 0.4 MG SL tablet Place 1 tablet (0.4 mg total) under the tongue every 5 (five) minutes as needed for chest pain (up to 3 doses). 25 tablet 4  . OVER THE COUNTER MEDICATION Take 10 mLs by mouth every 4 (four) hours as needed (cough or cold symptoms).    Marland Kitchen PROAIR HFA 108 (90 BASE) MCG/ACT inhaler Inhale 2 puffs into the lungs every 6 (six) hours as needed. Shortness of breath or wheezing    . sacubitril-valsartan (ENTRESTO) 97-103 MG Take 1 tablet by mouth 2 (two) times daily. 60 tablet 6  . spironolactone (ALDACTONE) 25 MG tablet Take 25 mg by mouth daily.     No current facility-administered medications on file prior to visit.     Past Medical History  Diagnosis Date  . HYPERLIPIDEMIA-MIXED 02/07/2009  . HYPERTENSION, BENIGN 08/22/2009  . CAD (coronary artery disease)     a. Inf-post MI 2008 s/p BMS to large marginal of Cx.   Marland Kitchen VENTRICULAR TACHYCARDIA 05/21/2010  . SYSTOLIC HEART FAILURE, CHRONIC 02/07/2009    a. EF 30% 2010, 29% by MRI 08/2012. b. s/p prophylactic Medtronic ICD implantation 10/2012.  . Tobacco abuse   . PVD (peripheral vascular disease) ([place])     a. Evaluated by Dr. [name] Anon 08/2012.  Marland Kitchen Automatic implantable cardioverter-defibrillator in situ   . TIA (transient ischemic attack) 05/18/2014     Past Surgical History  Procedure Laterality Date  .  Cardiac defibrillator placement  10/2012  . Cystoscopy w/ stone manipulation  1990's  . Cesarean section  1977; 1982  . Implantable cardioverter defibrillator implant N/A 10/21/2012    Procedure: IMPLANTABLE CARDIOVERTER DEFIBRILLATOR IMPLANT;  Surgeon: Evans Lance, MD;  Location: [facility] CATH LAB;  Service: Cardiovascular;  Laterality: N/A;     Family History  Problem Relation Age of Onset  . Cancer Mother     cervical  . Heart attack Father   . Asthma Brother   . Other Brother     Pacemaker     Social History   Social History  . Marital Status: Single    Spouse Name: N/A  . Number of Children: N/A  . Years  of Education: N/A   Occupational History  . Not on file.   Social History Main Topics  . Smoking status: Current Every Day Smoker -- 0.50 packs/day for 40 years    Types: Cigarettes  . Smokeless tobacco: Never Used  . Alcohol Use: 12.0 oz/week    20 Cans of beer per week     Comment: 05/18/2014 "2, 40's qod"  . Drug Use: No  . Sexual Activity: Yes   Other Topics Concern  . Not on file   Social History Narrative     PHYSICAL EXAM   BP 110/62 mmHg  Pulse 68  Ht 5' 9.5" (1.765 m)  Wt 130 lb 12.8 oz (59.33 kg)  BMI 19.05 kg/m2 Constitutional: She is oriented to person, place, and time. She appears well-developed and well-nourished. No distress.  HENT: No nasal discharge.  Head: Normocephalic and atraumatic.  Eyes: Pupils are equal and round. Right eye exhibits no discharge. Left eye exhibits no discharge.  Neck: Normal range of motion. Neck supple. No JVD present. No thyromegaly present. No carotid bruits Cardiovascular: Normal rate, regular rhythm with premature beats, normal heart sounds. Exam reveals no gallop and no friction rub. No murmur heard.  Pulmonary/Chest: Effort normal and breath sounds normal. No stridor. No respiratory distress. She has no wheezes. She has no rales. She exhibits no tenderness.  Abdominal: Soft. Bowel sounds are normal. She exhibits no distension. There is no tenderness. There is no rebound and no guarding.  Musculoskeletal: Normal range of motion. She exhibits no edema and no tenderness.  Neurological: She is alert and oriented to person, place, and time. Coordination normal.  Skin: Skin is warm and dry. No rash noted. She is not diaphoretic. No erythema. No pallor.  Psychiatric: She has a normal mood and affect. Her behavior is normal. Judgment and thought content normal.  Vascular: Femoral pulses slightly diminished on the right side and normal on the left side. Distal pulses are not palpable.    ASSESSMENT AND PLAN

## 2015-08-01 NOTE — Assessment & Plan Note (Signed)
She has stable bilateral calf claudication which is currently not lifestyle limiting. Continue medical therapy.

## 2015-08-01 NOTE — Assessment & Plan Note (Signed)
I spent 8 minutes discussing the importance of smoking cessation especially with underlying peripheral arterial disease. I discussed different options including nicotine replacement therapy versus medications. She wants to try the nicotine patch and this was prescribed today.

## 2015-08-01 NOTE — Patient Instructions (Addendum)
Medication Instructions:  Your physician has recommended you make the following change in your medication:  1. START Nicotine patch 14mg  place patch daily for 6 WEEKS and then DECREASE to 7mg  daily for 2 WEEKS  Labwork: No new orders.   Testing/Procedures: No new orders.   Follow-Up: Your physician wants you to follow-up in: 1 YEAR with Dr [name] Anon.  You will receive a reminder letter in the mail two months in advance. If you don't receive a letter, please call our office to schedule the follow-up appointment.   Any Other Special Instructions Will Be Listed Below (If Applicable).  Pt given printed prescriptions for Nicotine patches.     If you need a refill on your cardiac medications before your next appointment, please call your pharmacy.

## 2015-08-29 ENCOUNTER — Telehealth: Payer: Self-pay | Admitting: Cardiovascular Disease

## 2015-08-29 ENCOUNTER — Other Ambulatory Visit: Payer: Self-pay | Admitting: Cardiovascular Disease

## 2015-08-29 MED ORDER — CARVEDILOL 12.5 MG PO TABS: ORAL_TABLET | ORAL | Status: DC

## 2015-08-29 NOTE — Telephone Encounter (Signed)
Pt's request was resent to pt's pharmacy for a 90 day supply. Confirmation received.

## 2015-08-29 NOTE — Telephone Encounter (Signed)
New Message   *STAT* If patient is at the pharmacy, call can be transferred to refill team.   1. Which medications need to be refilled? (please list name of each medication and dose if known) Carvedolol  2. Which pharmacy/location (including street and city if local pharmacy) is medication to be sent to? [facility] on [address] [phone]  3. Do they need a 30 day or 90 day supply? [place]

## 2015-08-31 ENCOUNTER — Encounter: Payer: Medicare Other | Admitting: *Deleted

## 2015-08-31 ENCOUNTER — Telehealth: Payer: Self-pay | Admitting: Cardiology

## 2015-08-31 NOTE — Telephone Encounter (Signed)
Confirmed remote transmission w/ pt boyfriend.   

## 2015-09-01 ENCOUNTER — Encounter: Payer: Self-pay | Admitting: Cardiology

## 2015-09-08 ENCOUNTER — Ambulatory Visit (INDEPENDENT_AMBULATORY_CARE_PROVIDER_SITE_OTHER): Payer: Medicare Other | Admitting: *Deleted

## 2015-09-08 DIAGNOSIS — I420 Dilated cardiomyopathy: Secondary | ICD-10-CM | POA: Diagnosis not present

## 2015-09-14 NOTE — Progress Notes (Signed)
Remote ICD transmission.   

## 2015-09-26 LAB — CUP PACEART REMOTE DEVICE CHECK
Battery Remaining Longevity: 119 mo
Date Time Interrogation Session: 20161230193542
HIGH POWER IMPEDANCE MEASURED VALUE: 83 Ohm
Lead Channel Impedance Value: 456 Ohm
Lead Channel Pacing Threshold Amplitude: 0.5 V
Lead Channel Sensing Intrinsic Amplitude: 10.25 mV
Lead Channel Sensing Intrinsic Amplitude: 10.25 mV
Lead Channel Setting Pacing Pulse Width: 0.4 ms
MDC IDC LEAD IMPLANT DT: 20140212
MDC IDC LEAD LOCATION: 753860
MDC IDC LEAD MODEL: 6935
MDC IDC MSMT BATTERY VOLTAGE: 3.01 V
MDC IDC MSMT LEADCHNL RV IMPEDANCE VALUE: 532 Ohm
MDC IDC MSMT LEADCHNL RV PACING THRESHOLD PULSEWIDTH: 0.4 ms
MDC IDC SET LEADCHNL RV PACING AMPLITUDE: 2.5 V
MDC IDC SET LEADCHNL RV SENSING SENSITIVITY: 0.3 mV
MDC IDC STAT BRADY RV PERCENT PACED: 0.01 %

## 2015-09-27 ENCOUNTER — Encounter: Payer: Self-pay | Admitting: Cardiology

## 2015-10-07 ENCOUNTER — Other Ambulatory Visit: Payer: Self-pay | Admitting: Cardiovascular Disease

## 2015-11-30 ENCOUNTER — Ambulatory Visit (INDEPENDENT_AMBULATORY_CARE_PROVIDER_SITE_OTHER): Payer: Medicare Other | Admitting: Internal Medicine

## 2015-11-30 ENCOUNTER — Encounter: Payer: Self-pay | Admitting: Internal Medicine

## 2015-11-30 VITALS — BP 124/80 | HR 80 | Ht 69.5 in | Wt 138.8 lb

## 2015-11-30 DIAGNOSIS — I5022 Chronic systolic (congestive) heart failure: Secondary | ICD-10-CM | POA: Diagnosis not present

## 2015-11-30 LAB — CUP PACEART INCLINIC DEVICE CHECK
Battery Remaining Longevity: 116 mo
HIGH POWER IMPEDANCE MEASURED VALUE: 76 Ohm
Implantable Lead Location: 753860
Lead Channel Impedance Value: 589 Ohm
Lead Channel Pacing Threshold Pulse Width: 0.4 ms
Lead Channel Setting Pacing Amplitude: 2.5 V
Lead Channel Setting Sensing Sensitivity: 0.3 mV
MDC IDC LEAD IMPLANT DT: 20140212
MDC IDC LEAD MODEL: 6935
MDC IDC MSMT BATTERY VOLTAGE: 3.01 V
MDC IDC MSMT LEADCHNL RV IMPEDANCE VALUE: 513 Ohm
MDC IDC MSMT LEADCHNL RV PACING THRESHOLD AMPLITUDE: 0.5 V
MDC IDC MSMT LEADCHNL RV SENSING INTR AMPL: 13.125 mV
MDC IDC SESS DTM: 20170323124627
MDC IDC SET LEADCHNL RV PACING PULSEWIDTH: 0.4 ms
MDC IDC STAT BRADY RV PERCENT PACED: 0.01 %

## 2015-11-30 NOTE — Patient Instructions (Signed)
Medication Instructions:  Your physician recommends that you continue on your current medications as directed. Please refer to the Current Medication list given to you today.   Labwork: None ordered   Testing/Procedures: None ordered   Follow-Up:  Your physician wants you to follow-up in: 12 months with Dr Knox Saliva will receive a reminder letter in the mail two months in advance. If you don't receive a letter, please call our office to schedule the follow-up appointment.  Remote monitoring is used to monitor your  ICD from home. This monitoring reduces the number of office visits required to check your device to one time per year. It allows [place] to keep an eye on the functioning of your device to ensure it is working properly. You are scheduled for a device check from home on 02/29/16. You may send your transmission at any time that day. If you have a wireless device, the transmission will be sent automatically. After your physician reviews your transmission, you will receive a postcard with your next transmission date.     Any Other Special Instructions Will Be Listed Below (If Applicable).     If you need a refill on your cardiac medications before your next appointment, please call your pharmacy.

## 2015-11-30 NOTE — Progress Notes (Signed)
HPI [name] returns today for followup. She is a pleasant 60 yo woman with a h/o CAD, VT, chronic systolic heart failure, s/p ICD implant. She has done well in the interim. She denies chest pain, sob, or syncope. No ICD shock. She has been reducing her tobacco use and is now down to 2 cigarettes a day.  Allergies  Allergen Reactions  . Clopidogrel Bisulfate Rash     Current Outpatient Prescriptions  Medication Sig Dispense Refill  . acetaminophen (TYLENOL) 500 MG tablet Take 500 mg by mouth every 6 (six) hours as needed for headache.     Marland Kitchen aspirin 81 MG tablet Take 81 mg by mouth daily.    Marland Kitchen atorvastatin (LIPITOR) 10 MG tablet Take 1 tablet (10 mg total) by mouth daily at 6 PM. 30 tablet 11  . budesonide-formoterol (SYMBICORT) 160-4.5 MCG/ACT inhaler Inhale 2 puffs into the lungs 2 (two) times daily.    . carvedilol (COREG) 12.5 MG tablet TAKE 1 TABLET (12.5 MG TOTAL) BY MOUTH 2 (TWO) TIMES DAILY WITH A MEAL. 180 tablet 3  . feeding supplement, ENSURE COMPLETE, (ENSURE COMPLETE) LIQD Take 237 mLs by mouth daily.    Marland Kitchen guaiFENesin (MUCINEX) 600 MG 12 hr tablet Take 2 tablets (1,200 mg total) by mouth 2 (two) times daily as needed for cough or to loosen phlegm. 45 tablet 0  . ipratropium (ATROVENT) 0.02 % nebulizer solution Inhale 0.5 mg into the lungs 4 (four) times daily as needed. wheezing    . losartan (COZAAR) 100 MG tablet TAKE 1 TABLET BY MOUTH DAILY 90 tablet 1  . nitroGLYCERIN (NITROSTAT) 0.4 MG SL tablet Place 1 tablet (0.4 mg total) under the tongue every 5 (five) minutes as needed for chest pain (up to 3 doses). 25 tablet 4  . OVER THE COUNTER MEDICATION Take 10 mLs by mouth every 4 (four) hours as needed (cough or cold symptoms).    Marland Kitchen PROAIR HFA 108 (90 BASE) MCG/ACT inhaler Inhale 2 puffs into the lungs every 6 (six) hours as needed. Shortness of breath or wheezing    . sacubitril-valsartan (ENTRESTO) 97-103 MG Take 1 tablet by mouth 2 (two) times daily. 60 tablet 6  .  spironolactone (ALDACTONE) 25 MG tablet Take 25 mg by mouth daily.     No current facility-administered medications for this visit.     Past Medical History  Diagnosis Date  . HYPERLIPIDEMIA-MIXED 02/07/2009  . HYPERTENSION, BENIGN 08/22/2009  . CAD (coronary artery disease)     a. Inf-post MI 2008 s/p BMS to large marginal of Cx.   Marland Kitchen VENTRICULAR TACHYCARDIA 05/21/2010  . SYSTOLIC HEART FAILURE, CHRONIC 02/07/2009    a. EF 30% 2010, 29% by MRI 08/2012. b. s/p prophylactic Medtronic ICD implantation 10/2012.  . Tobacco abuse   . PVD (peripheral vascular disease) ([place])     a. Evaluated by Dr. [name] Anon 08/2012.  Marland Kitchen Automatic implantable cardioverter-defibrillator in situ   . TIA (transient ischemic attack) 05/18/2014    ROS:   All systems reviewed and negative except as noted in the HPI.   Past Surgical History  Procedure Laterality Date  . Cardiac defibrillator placement  10/2012  . Cystoscopy w/ stone manipulation  1990's  . Cesarean section  1977; 1982  . Implantable cardioverter defibrillator implant N/A 10/21/2012    Procedure: IMPLANTABLE CARDIOVERTER DEFIBRILLATOR IMPLANT;  Surgeon: Evans Lance, MD;  Location: [facility] CATH LAB;  Service: Cardiovascular;  Laterality: N/A;     Family History  Problem  Relation Age of Onset  . Cancer Mother     cervical  . Heart attack Father   . Asthma Brother   . Other Brother     Pacemaker     Social History   Social History  . Marital Status: Single    Spouse Name: N/A  . Number of Children: N/A  . Years of Education: N/A   Occupational History  . Not on file.   Social History Main Topics  . Smoking status: Current Every Day Smoker -- 0.50 packs/day for 40 years    Types: Cigarettes  . Smokeless tobacco: Never Used  . Alcohol Use: 12.0 oz/week    20 Cans of beer per week     Comment: 05/18/2014 "2, 40's qod"  . Drug Use: No  . Sexual Activity: Yes   Other Topics Concern  . Not on file   Social History Narrative     BP  124/80 mmHg  Pulse 80  Ht 5' 9.5" (1.765 m)  Wt 138 lb 12.8 oz (62.959 kg)  BMI 20.21 kg/m2  Physical Exam:  Well appearing middle aged woman, NAD HEENT: Unremarkable Neck: 6 cm JVD, no thyromegally Back:  No CVA tenderness Lungs:  Clear with no wheezes, rales, or rhonchi, well healed ICD incision HEART:  Regular rate rhythm, no murmurs, no rubs, no clicks Abd:  soft, positive bowel sounds, no organomegally, no rebound, no guarding Ext:  2 plus pulses, no edema, no cyanosis, no clubbing Skin:  No rashes no nodules Neuro:  CN II through XII intact, motor grossly intact  DEVICE  Normal device function.  See PaceArt for details.   Assess/Plan: 1. VT - she has had no recurrent episodes of VT. She will continue her current meds. 2. Chronic systolic heart failure - she has class 2 symptoms. I have asked her to continue her current meds and try to maintain a low sodium diet. 3. CAD - she denies anginal symptoms. No change in her meds. 4. ICD - Her medtronic single chamber device is working normally. Will recheck in several months. 5. Tobacco abuse - with the nicotine patch, she is down to 2 cigs a day. I have encouraged her to stop altogether.  Mikle Bosworth.D.

## 2016-01-31 ENCOUNTER — Emergency Department (HOSPITAL_COMMUNITY)
Admission: EM | Admit: 2016-01-31 | Discharge: 2016-01-31 | Disposition: A | Discharge status: Home / Self Care | Payer: Medicare Other | Attending: Emergency Medicine | Admitting: Emergency Medicine

## 2016-01-31 ENCOUNTER — Encounter (HOSPITAL_COMMUNITY): Payer: Self-pay | Admitting: Emergency Medicine

## 2016-01-31 ENCOUNTER — Emergency Department (HOSPITAL_COMMUNITY): Payer: Medicare Other

## 2016-01-31 DIAGNOSIS — I110 Hypertensive heart disease with heart failure: Secondary | ICD-10-CM | POA: Insufficient documentation

## 2016-01-31 DIAGNOSIS — E782 Mixed hyperlipidemia: Secondary | ICD-10-CM | POA: Insufficient documentation

## 2016-01-31 DIAGNOSIS — Y939 Activity, unspecified: Secondary | ICD-10-CM | POA: Diagnosis not present

## 2016-01-31 DIAGNOSIS — I2510 Atherosclerotic heart disease of native coronary artery without angina pectoris: Secondary | ICD-10-CM | POA: Insufficient documentation

## 2016-01-31 DIAGNOSIS — Z95810 Presence of automatic (implantable) cardiac defibrillator: Secondary | ICD-10-CM | POA: Insufficient documentation

## 2016-01-31 DIAGNOSIS — S93402A Sprain of unspecified ligament of left ankle, initial encounter: Secondary | ICD-10-CM | POA: Insufficient documentation

## 2016-01-31 DIAGNOSIS — F17210 Nicotine dependence, cigarettes, uncomplicated: Secondary | ICD-10-CM | POA: Diagnosis not present

## 2016-01-31 DIAGNOSIS — Z8673 Personal history of transient ischemic attack (TIA), and cerebral infarction without residual deficits: Secondary | ICD-10-CM | POA: Insufficient documentation

## 2016-01-31 DIAGNOSIS — Z7982 Long term (current) use of aspirin: Secondary | ICD-10-CM | POA: Diagnosis not present

## 2016-01-31 DIAGNOSIS — X509XXA Other and unspecified overexertion or strenuous movements or postures, initial encounter: Secondary | ICD-10-CM | POA: Diagnosis not present

## 2016-01-31 DIAGNOSIS — I5022 Chronic systolic (congestive) heart failure: Secondary | ICD-10-CM | POA: Insufficient documentation

## 2016-01-31 DIAGNOSIS — S99922A Unspecified injury of left foot, initial encounter: Secondary | ICD-10-CM | POA: Diagnosis not present

## 2016-01-31 DIAGNOSIS — Z79899 Other long term (current) drug therapy: Secondary | ICD-10-CM | POA: Diagnosis not present

## 2016-01-31 DIAGNOSIS — S99912A Unspecified injury of left ankle, initial encounter: Secondary | ICD-10-CM | POA: Diagnosis present

## 2016-01-31 DIAGNOSIS — Y999 Unspecified external cause status: Secondary | ICD-10-CM | POA: Diagnosis not present

## 2016-01-31 DIAGNOSIS — Y9289 Other specified places as the place of occurrence of the external cause: Secondary | ICD-10-CM | POA: Diagnosis not present

## 2016-01-31 DIAGNOSIS — M79672 Pain in left foot: Secondary | ICD-10-CM | POA: Diagnosis not present

## 2016-01-31 NOTE — Discharge Instructions (Signed)
Minimize walking.  Wear boot when walking. Ice and elevate ankle whenever possible.   Ankle Sprain An ankle sprain is an injury to the strong, fibrous tissues (ligaments) that hold the bones of your ankle joint together.  CAUSES An ankle sprain is usually caused by a fall or by twisting your ankle. Ankle sprains most commonly occur when you step on the outer edge of your foot, and your ankle turns inward. People who participate in sports are more prone to these types of injuries.  SYMPTOMS   Pain in your ankle. The pain may be present at rest or only when you are trying to stand or walk.  Swelling.  Bruising. Bruising may develop immediately or within 1 to 2 days after your injury.  Difficulty standing or walking, particularly when turning corners or changing directions. DIAGNOSIS  Your caregiver will ask you details about your injury and perform a physical exam of your ankle to determine if you have an ankle sprain. During the physical exam, your caregiver will press on and apply pressure to specific areas of your foot and ankle. Your caregiver will try to move your ankle in certain ways. An X-ray exam may be done to be sure a bone was not broken or a ligament did not separate from one of the bones in your ankle (avulsion fracture).  TREATMENT  Certain types of braces can help stabilize your ankle. Your caregiver can make a recommendation for this. Your caregiver may recommend the use of medicine for pain. If your sprain is severe, your caregiver may refer you to a surgeon who helps to restore function to parts of your skeletal system (orthopedist) or a physical therapist. [place] ice to your injury for 1-2 days or as directed by your caregiver. Applying ice helps to reduce inflammation and pain.  Put ice in a plastic bag.  Place a towel between your skin and the bag.  Leave the ice on for 15-20 minutes at a time, every 2 hours while you are awake.  Only take  over-the-counter or prescription medicines for pain, discomfort, or fever as directed by your caregiver.  Elevate your injured ankle above the level of your heart as much as possible for 2-3 days.  If your caregiver recommends crutches, use them as instructed. Gradually put weight on the affected ankle. Continue to use crutches or a cane until you can walk without feeling pain in your ankle.  If you have a plaster splint, wear the splint as directed by your caregiver. Do not rest it on anything harder than a pillow for the first 24 hours. Do not put weight on it. Do not get it wet. You may take it off to take a shower or bath.  You may have been given an elastic bandage to wear around your ankle to provide support. If the elastic bandage is too tight (you have numbness or tingling in your foot or your foot becomes cold and blue), adjust the bandage to make it comfortable.  If you have an air splint, you may blow more air into it or let air out to make it more comfortable. You may take your splint off at night and before taking a shower or bath. Wiggle your toes in the splint several times per day to decrease swelling. SEEK MEDICAL CARE IF:   You have rapidly increasing bruising or swelling.  Your toes feel extremely cold or you lose feeling in your foot.  Your pain is not  relieved with medicine. SEEK IMMEDIATE MEDICAL CARE IF:  Your toes are numb or blue.  You have severe pain that is increasing. MAKE SURE YOU:   Understand these instructions.  Will watch your condition.  Will get help right away if you are not doing well or get worse.   This information is not intended to replace advice given to you by your health care provider. Make sure you discuss any questions you have with your health care provider.   Document Released: 08/26/2005 Document Revised: 09/16/2014 Document Reviewed: 09/07/2011 Elsevier Interactive Patient Education [employer].

## 2016-01-31 NOTE — ED Notes (Signed)
Pt arrives from home with c/o L lateral foot pain and swelling x2 days. Pt had twisted ankle 10 days ago, but pain and swelling to L side of foot started 2 days ago. Pt had been soaking and elevating feet daily. Tender to touch, able to bear some weight on L Leg.

## 2016-01-31 NOTE — ED Provider Notes (Signed)
CSN: [id number]     Arrival date & time 01/31/16  0654 History   First MD Initiated Contact with Patient 01/31/16 [phone]     Chief Complaint  Patient presents with  . Foot Pain      HPI  Patient presents for evaluation of left ankle pain. Inverted her ankle several days ago stepping off of the porch.  No other areas of pain or concern or injury. She is able to weight-bear some although she has pain, and limps.  Past Medical History  Diagnosis Date  . HYPERLIPIDEMIA-MIXED 02/07/2009  . HYPERTENSION, BENIGN 08/22/2009  . CAD (coronary artery disease)     a. Inf-post MI 2008 s/p BMS to large marginal of Cx.   Marland Kitchen VENTRICULAR TACHYCARDIA 05/21/2010  . SYSTOLIC HEART FAILURE, CHRONIC 02/07/2009    a. EF 30% 2010, 29% by MRI 08/2012. b. s/p prophylactic Medtronic ICD implantation 10/2012.  . Tobacco abuse   . PVD (peripheral vascular disease) ([place])     a. Evaluated by Dr. [name] Anon 08/2012.  Marland Kitchen Automatic implantable cardioverter-defibrillator in situ   . TIA (transient ischemic attack) 05/18/2014   Past Surgical History  Procedure Laterality Date  . Cardiac defibrillator placement  10/2012  . Cystoscopy w/ stone manipulation  1990's  . Cesarean section  1977; 1982  . Implantable cardioverter defibrillator implant N/A 10/21/2012    Procedure: IMPLANTABLE CARDIOVERTER DEFIBRILLATOR IMPLANT;  Surgeon: Evans Lance, MD;  Location: [facility] CATH LAB;  Service: Cardiovascular;  Laterality: N/A;   Family History  Problem Relation Age of Onset  . Cancer Mother     cervical  . Heart attack Father   . Asthma Brother   . Other Brother     Pacemaker   Social History  Substance Use Topics  . Smoking status: Current Every Day Smoker -- 0.50 packs/day for 40 years    Types: Cigarettes  . Smokeless tobacco: Never Used  . Alcohol Use: 12.0 oz/week    20 Cans of beer per week     Comment: 05/18/2014 "2, 40's qod"   OB History    No data available     Review of Systems  Constitutional: Negative for fever,  chills, diaphoresis, appetite change and fatigue.  HENT: Negative for mouth sores, sore throat and trouble swallowing.   Eyes: Negative for visual disturbance.  Respiratory: Negative for cough, chest tightness, shortness of breath and wheezing.   Cardiovascular: Negative for chest pain.  Gastrointestinal: Negative for nausea, vomiting, abdominal pain, diarrhea and abdominal distention.  Endocrine: Negative for polydipsia, polyphagia and polyuria.  Genitourinary: Negative for dysuria, frequency and hematuria.  Musculoskeletal: Negative for gait problem.       Left foot pain  Skin: Negative for color change, pallor and rash.  Neurological: Negative for dizziness, syncope, light-headedness and headaches.  Hematological: Does not bruise/bleed easily.  Psychiatric/Behavioral: Negative for behavioral problems and confusion.      Allergies  Clopidogrel bisulfate  Home Medications   Prior to Admission medications   Medication Sig Start Date End Date Taking? Authorizing Provider  acetaminophen (TYLENOL) 500 MG tablet Take 500 mg by mouth every 6 (six) hours as needed for headache.     Historical Provider, MD  aspirin 81 MG tablet Take 81 mg by mouth daily.    Historical Provider, MD  atorvastatin (LIPITOR) 10 MG tablet Take 1 tablet (10 mg total) by mouth daily at 6 PM. 06/20/14   [name], MD  budesonide-formoterol [facility]) 160-4.5 MCG/ACT inhaler Inhale 2 puffs into the  lungs 2 (two) times daily. 12/15/14 12/15/15  Historical Provider, MD  carvedilol (COREG) 12.5 MG tablet TAKE 1 TABLET (12.5 MG TOTAL) BY MOUTH 2 (TWO) TIMES DAILY WITH A MEAL. 08/29/15   [name], MD  feeding supplement, ENSURE COMPLETE, (ENSURE COMPLETE) LIQD Take 237 mLs by mouth daily. 05/19/14   Maryann Mikhail, DO  guaiFENesin (MUCINEX) 600 MG 12 hr tablet Take 2 tablets (1,200 mg total) by mouth 2 (two) times daily as needed for cough or to loosen phlegm. 12/03/14   Robbie Lis, MD  ipratropium (ATROVENT)  0.02 % nebulizer solution Inhale 0.5 mg into the lungs 4 (four) times daily as needed. wheezing 12/15/14 12/15/15  Historical Provider, MD  losartan (COZAAR) 100 MG tablet TAKE 1 TABLET BY MOUTH DAILY 10/09/15   [name], MD  nitroGLYCERIN (NITROSTAT) 0.4 MG SL tablet Place 1 tablet (0.4 mg total) under the tongue every 5 (five) minutes as needed for chest pain (up to 3 doses). 06/20/14   [name], MD  OVER THE COUNTER MEDICATION Take 10 mLs by mouth every 4 (four) hours as needed (cough or cold symptoms).    Historical Provider, MD  PROAIR HFA 108 (90 BASE) MCG/ACT inhaler Inhale 2 puffs into the lungs every 6 (six) hours as needed. Shortness of breath or wheezing 11/28/14   Historical Provider, MD  sacubitril-valsartan (ENTRESTO) 97-103 MG Take 1 tablet by mouth 2 (two) times daily. 03/30/15   Jolaine Artist, MD  spironolactone (ALDACTONE) 25 MG tablet Take 25 mg by mouth daily.    Historical Provider, MD   There were no vitals taken for this visit. Physical Exam  Constitutional: She is oriented to person, place, and time. She appears well-developed and well-nourished. No distress.  HENT:  Head: Normocephalic.  Eyes: Conjunctivae are normal. Pupils are equal, round, and reactive to light. No scleral icterus.  Neck: Normal range of motion. Neck supple. No thyromegaly present.  Cardiovascular: Normal rate and regular rhythm.  Exam reveals no gallop and no friction rub.   No murmur heard. Pulmonary/Chest: Effort normal and breath sounds normal. No respiratory distress. She has no wheezes. She has no rales.  Abdominal: Soft. Bowel sounds are normal. She exhibits no distension. There is no tenderness. There is no rebound.  Musculoskeletal: Normal range of motion.       Feet:  Neurological: She is alert and oriented to person, place, and time.  Skin: Skin is warm and dry. No rash noted.  Psychiatric: She has a normal mood and affect. Her behavior is normal.    ED Course  Procedures  (including critical care time) Labs Review Labs Reviewed - No data to display  Imaging Review Dg Foot Complete Left  01/31/2016  CLINICAL DATA:  Injured LEFT foot stepping off porch 2 weeks ago, fell like she twisted her foot, lateral foot pain along fifth metatarsal since especially when bearing weight EXAM: LEFT FOOT - COMPLETE 3+ VIEW COMPARISON:  None FINDINGS: Diffuse osseous demineralization. Joint spaces preserved. No acute fracture, dislocation, or bone destruction. IMPRESSION: Osseous demineralization without acute abnormalities. Electronically Signed   By: Lavonia Dana M.D.   On: 01/31/2016 08:16   I have personally reviewed and evaluated these images and lab results as part of my medical decision-making.   EKG Interpretation None      MDM   Final diagnoses:  Ankle sprain, left, initial encounter    Negative x-ray. Patient unable to coordinate use of crutches. Walker placed. Plan will be  minimizing ambulation. Weightbearing as tolerated with boot until symptoms improve.    Tanna Furry, MD 01/31/16 [phone]

## 2016-02-01 ENCOUNTER — Ambulatory Visit: Payer: Medicare Other | Admitting: Cardiovascular Disease

## 2016-02-19 NOTE — Progress Notes (Signed)
Patient ID: [name], female   DOB: 22-Jul-1956, 60 y.o.   MRN: [id number]  PCP: Dr. Nonda Lou [facility]) Primary Cardiologist: Dr. Johnsie Cancel HF: Dr. Aundra Dubin  60 y.o.  with history of CAD s/p inferoposterior MI in 2008, PAD, COPD, and ischemic cardiomyopathy with chronic systolic CHF.  She has had no cardiac cath since 2008 at time of MI.  She has PAD with bilateral SFA occlusions followed by Dr [name] Anon.  Single lead medtronic AICD followed by Dr Lovena Le  EF previously 15% but Myoview 7/16 with EF 50%.  She had a recent myoview that showed large improvement of her EF since echo in 9/15.  Denies any claudications symptoms. Denies chest pain. She continues to smoke 4-5 cigarettes/day and has likely COPD, would be interested in using medication. She says she tried the patch with no success.  She denies any SOB, even on hills. Has moved and no longer has steps at her house, so is unsure how those would effect.  No orthopnea, PND, or bendopnea.  No lightheadedness or syncope.    Trying to cut back cigarettes CXR 07/2015 NAD   Echo 04/12/15 Study Conclusions  - Left ventricle: The cavity size was normal. Wall thickness was  normal. Systolic function was normal. The estimated ejection  fraction was in the range of 50% to 55%. There is akinesis of the  basal-midinferolateral and inferior myocardium. Doppler  parameters are consistent with abnormal left ventricular  relaxation (grade 1 diastolic dysfunction). - Aortic valve: There was trivial regurgitation.  Impressions:  - Inferior and inferior lateral akinesis with overall low normal LV  function; grade 1 diastolic dysfunction; trace AI and MR.  Lab Results  Component Value Date   CREATININE 0.67 04/05/2015   BUN <5* 04/05/2015   NA 139 04/05/2015   K 4.2 04/05/2015   CL 108 04/05/2015   CO2 24 04/05/2015   Lab Results  Component Value Date   LDLCALC 60 05/19/2014      PMH: 1. PAD: 10/15 ABIs 0.76 right, 0.79 left  with bilateral SFA occlusions.  Follows with Dr [name] Anon.  2. CAD: Inferoposterior MI in 2008 with BMS to LCx.  Allergic to Plavix (rash).   3. COPD: Active smoker.  4. Ischemic cardiomyopathy:  - cMRI (2/13): Inferior and inferolateral scar, EF 29%  - Medtronic ICD in 2/14.  - Echo (9/15) with EF 15%, diffuse hypokinesis with akinesis of the anteroseptal, lateral and inferolateral walls, mild AI, mild MR, PA systolic pressure 39 mmHg.  - Nuclear stress 03/21/15 EF 50%, low risk study. 5. H/o VT: Has ICD.  6. Hyperlipidemia 7. HTN  SH: Smokes 4-5 cigs/day, no ETOH, lives in [place]  [place]: CAD  ROS: All systems reviewed and negative except as per HPI.    Current Outpatient Prescriptions  Medication Sig Dispense Refill  . acetaminophen (TYLENOL) 500 MG tablet Take 500 mg by mouth every 6 (six) hours as needed for headache.     Marland Kitchen aspirin 81 MG tablet Take 81 mg by mouth daily.    Marland Kitchen atorvastatin (LIPITOR) 10 MG tablet Take 1 tablet (10 mg total) by mouth daily at 6 PM. 30 tablet 11  . carvedilol (COREG) 12.5 MG tablet TAKE 1 TABLET (12.5 MG TOTAL) BY MOUTH 2 (TWO) TIMES DAILY WITH A MEAL. 180 tablet 3  . feeding supplement, ENSURE COMPLETE, (ENSURE COMPLETE) LIQD Take 237 mLs by mouth daily.    Marland Kitchen guaiFENesin (MUCINEX) 600 MG 12 hr tablet Take 2 tablets (1,200 mg total) by  mouth 2 (two) times daily as needed for cough or to loosen phlegm. 45 tablet 0  . losartan (COZAAR) 100 MG tablet TAKE 1 TABLET BY MOUTH DAILY 90 tablet 1  . nitroGLYCERIN (NITROSTAT) 0.4 MG SL tablet Place 1 tablet (0.4 mg total) under the tongue every 5 (five) minutes as needed for chest pain (up to 3 doses). 25 tablet 4  . PROAIR HFA 108 (90 BASE) MCG/ACT inhaler Inhale 2 puffs into the lungs every 6 (six) hours as needed. Shortness of breath or wheezing    . sacubitril-valsartan (ENTRESTO) 97-103 MG Take 1 tablet by mouth 2 (two) times daily. 60 tablet 6  . spironolactone (ALDACTONE) 25 MG tablet Take 25 mg by mouth  daily.    . budesonide-formoterol (SYMBICORT) 160-4.5 MCG/ACT inhaler Inhale 2 puffs into the lungs 2 (two) times daily.    Marland Kitchen ipratropium (ATROVENT) 0.02 % nebulizer solution Inhale 0.5 mg into the lungs 4 (four) times daily as needed. wheezing     No current facility-administered medications for this visit.   BP 142/70 mmHg  Pulse 90  Ht 5' 9.5" (1.765 m)  Wt 60.691 kg (133 lb 12.8 oz)  BMI 19.48 kg/m2 Affect appropriate Thin black female  HEENT: normal Neck supple with no adenopathy JVP normal no bruits no thyromegaly Lungs clear with no wheezing and good diaphragmatic motion Heart:  S1/S2 no murmur, no rub, gallop or click AICD under left clavicle  PMI normal Abdomen: benighn, BS positve, no tenderness, no AAA no bruit.  No HSM or HJR Distal pulses intact with no bruits No edema Neuro non-focal Skin warm and dry No muscular weakness    ECG:  12/02/14  SR rate 68 PVC ICLBBB  Assessment/Plan: 1. Chronic systolic euvolemic EF improved by last echo 50-55% continue medical RX  Including entresto 2. CAD: S/p inferoposterior MI with PCI in 2008.  Continue statin and ASA 81.  Myoview ok.03/2015  3. PAD: Bilateral SFA occlusions on arterial duplex in 10/15. No significant claudication.  Has followup with Dr [name] Anon. Needs to stop smoking.  4. Smoking: Active smoker but only 2-3 cigs/day.  Suspect COPD.  Strongly recommend cessation. Would consider medication.  Patches have not helped her. CXR 07/2015 ok   Jenkins Rouge

## 2016-02-20 ENCOUNTER — Encounter: Payer: Self-pay | Admitting: Cardiovascular Disease

## 2016-02-20 ENCOUNTER — Ambulatory Visit (INDEPENDENT_AMBULATORY_CARE_PROVIDER_SITE_OTHER): Payer: Medicare Other | Admitting: Cardiovascular Disease

## 2016-02-20 VITALS — BP 142/70 | HR 90 | Ht 69.5 in | Wt 133.8 lb

## 2016-02-20 DIAGNOSIS — I5022 Chronic systolic (congestive) heart failure: Secondary | ICD-10-CM

## 2016-02-20 DIAGNOSIS — I10 Essential (primary) hypertension: Secondary | ICD-10-CM

## 2016-02-20 NOTE — Patient Instructions (Addendum)
Medication Instructions:  Your physician recommends that you continue on your current medications as directed. Please refer to the Current Medication list given to you today.  Labwork: NONE  Testing/Procedures: NONE  Follow-Up: Your physician wants you to follow-up in: 6 months with Dr. [name] Anon. You will receive a reminder letter in the mail two months in advance. If you don't receive a letter, please call our office to schedule the follow-up appointment.  Your physician wants you to follow-up in: 12 months with Dr. Johnsie Cancel. You will receive a reminder letter in the mail two months in advance. If you don't receive a letter, please call our office to schedule the follow-up appointment.   If you need a refill on your cardiac medications before your next appointment, please call your pharmacy.

## 2016-02-22 ENCOUNTER — Telehealth: Payer: Self-pay

## 2016-02-22 NOTE — Telephone Encounter (Signed)
Prior auth obtained for Entresto 97-103 through [facility]. PA- [id number]. Good through 09/08/2016.

## 2016-02-29 ENCOUNTER — Telehealth: Payer: Self-pay | Admitting: Cardiology

## 2016-02-29 ENCOUNTER — Ambulatory Visit (INDEPENDENT_AMBULATORY_CARE_PROVIDER_SITE_OTHER): Payer: Medicare Other | Admitting: *Deleted

## 2016-02-29 DIAGNOSIS — I472 Ventricular tachycardia: Secondary | ICD-10-CM | POA: Diagnosis not present

## 2016-02-29 DIAGNOSIS — Z95810 Presence of automatic (implantable) cardiac defibrillator: Secondary | ICD-10-CM

## 2016-02-29 DIAGNOSIS — I4729 Other ventricular tachycardia: Secondary | ICD-10-CM

## 2016-02-29 NOTE — Telephone Encounter (Signed)
Spoke with pt and reminded pt of remote transmission that is due today. Pt verbalized understanding.   

## 2016-02-29 NOTE — Progress Notes (Signed)
Remote ICD transmission.   

## 2016-03-02 LAB — CUP PACEART REMOTE DEVICE CHECK
Battery Remaining Longevity: 113 mo
Battery Voltage: 3.01 V
Brady Statistic RV Percent Paced: 0.02 %
HighPow Impedance: 72 Ohm
Implantable Lead Location: 753860
Implantable Lead Model: 6935
Lead Channel Impedance Value: 456 Ohm
Lead Channel Impedance Value: 513 Ohm
Lead Channel Setting Pacing Amplitude: 2.5 V
Lead Channel Setting Pacing Pulse Width: 0.4 ms
Lead Channel Setting Sensing Sensitivity: 0.3 mV
MDC IDC LEAD IMPLANT DT: 20140212
MDC IDC MSMT LEADCHNL RV PACING THRESHOLD AMPLITUDE: 0.5 V
MDC IDC MSMT LEADCHNL RV PACING THRESHOLD PULSEWIDTH: 0.4 ms
MDC IDC MSMT LEADCHNL RV SENSING INTR AMPL: 9.375 mV
MDC IDC MSMT LEADCHNL RV SENSING INTR AMPL: 9.375 mV
MDC IDC SESS DTM: 20170622155121

## 2016-05-10 ENCOUNTER — Other Ambulatory Visit: Payer: Self-pay | Admitting: Cardiovascular Disease

## 2016-05-10 ENCOUNTER — Other Ambulatory Visit (HOSPITAL_COMMUNITY): Payer: Self-pay | Admitting: Internal Medicine

## 2016-05-10 DIAGNOSIS — I5022 Chronic systolic (congestive) heart failure: Secondary | ICD-10-CM

## 2016-05-10 NOTE — Telephone Encounter (Signed)
Looks like dose was changed by the chf clinic on 02/27/15 to 25 mg qd. Ok to refill or forward to the chf clinic?

## 2016-05-16 DIAGNOSIS — R0602 Shortness of breath: Secondary | ICD-10-CM | POA: Diagnosis not present

## 2016-05-17 ENCOUNTER — Emergency Department (HOSPITAL_COMMUNITY): Payer: Medicare Other

## 2016-05-17 ENCOUNTER — Encounter (HOSPITAL_COMMUNITY): Payer: Self-pay | Admitting: Emergency Medicine

## 2016-05-17 ENCOUNTER — Inpatient Hospital Stay (HOSPITAL_COMMUNITY)
Admission: EM | Admit: 2016-05-17 | Discharge: 2016-05-20 | DRG: 190 | Disposition: A | Discharge status: Home / Self Care | Payer: Medicare Other | Attending: Internal Medicine | Admitting: Internal Medicine

## 2016-05-17 DIAGNOSIS — I2510 Atherosclerotic heart disease of native coronary artery without angina pectoris: Secondary | ICD-10-CM | POA: Diagnosis present

## 2016-05-17 DIAGNOSIS — I255 Ischemic cardiomyopathy: Secondary | ICD-10-CM | POA: Diagnosis present

## 2016-05-17 DIAGNOSIS — I252 Old myocardial infarction: Secondary | ICD-10-CM | POA: Diagnosis not present

## 2016-05-17 DIAGNOSIS — I119 Hypertensive heart disease without heart failure: Secondary | ICD-10-CM | POA: Diagnosis not present

## 2016-05-17 DIAGNOSIS — J441 Chronic obstructive pulmonary disease with (acute) exacerbation: Principal | ICD-10-CM | POA: Diagnosis present

## 2016-05-17 DIAGNOSIS — Z789 Other specified health status: Secondary | ICD-10-CM | POA: Diagnosis present

## 2016-05-17 DIAGNOSIS — Z955 Presence of coronary angioplasty implant and graft: Secondary | ICD-10-CM | POA: Diagnosis not present

## 2016-05-17 DIAGNOSIS — I110 Hypertensive heart disease with heart failure: Secondary | ICD-10-CM | POA: Diagnosis present

## 2016-05-17 DIAGNOSIS — Z79899 Other long term (current) drug therapy: Secondary | ICD-10-CM | POA: Diagnosis not present

## 2016-05-17 DIAGNOSIS — J189 Pneumonia, unspecified organism: Secondary | ICD-10-CM | POA: Diagnosis present

## 2016-05-17 DIAGNOSIS — F17210 Nicotine dependence, cigarettes, uncomplicated: Secondary | ICD-10-CM | POA: Diagnosis present

## 2016-05-17 DIAGNOSIS — J440 Chronic obstructive pulmonary disease with acute lower respiratory infection: Secondary | ICD-10-CM | POA: Diagnosis present

## 2016-05-17 DIAGNOSIS — F1090 Alcohol use, unspecified, uncomplicated: Secondary | ICD-10-CM | POA: Diagnosis present

## 2016-05-17 DIAGNOSIS — Z95810 Presence of automatic (implantable) cardiac defibrillator: Secondary | ICD-10-CM | POA: Diagnosis not present

## 2016-05-17 DIAGNOSIS — Z8673 Personal history of transient ischemic attack (TIA), and cerebral infarction without residual deficits: Secondary | ICD-10-CM | POA: Diagnosis not present

## 2016-05-17 DIAGNOSIS — R079 Chest pain, unspecified: Secondary | ICD-10-CM | POA: Diagnosis not present

## 2016-05-17 DIAGNOSIS — I4581 Long QT syndrome: Secondary | ICD-10-CM | POA: Diagnosis present

## 2016-05-17 DIAGNOSIS — I739 Peripheral vascular disease, unspecified: Secondary | ICD-10-CM | POA: Diagnosis present

## 2016-05-17 DIAGNOSIS — E785 Hyperlipidemia, unspecified: Secondary | ICD-10-CM | POA: Diagnosis present

## 2016-05-17 DIAGNOSIS — Z7289 Other problems related to lifestyle: Secondary | ICD-10-CM | POA: Diagnosis present

## 2016-05-17 DIAGNOSIS — I10 Essential (primary) hypertension: Secondary | ICD-10-CM | POA: Diagnosis present

## 2016-05-17 DIAGNOSIS — I5022 Chronic systolic (congestive) heart failure: Secondary | ICD-10-CM | POA: Diagnosis present

## 2016-05-17 DIAGNOSIS — Z87891 Personal history of nicotine dependence: Secondary | ICD-10-CM

## 2016-05-17 DIAGNOSIS — Z7982 Long term (current) use of aspirin: Secondary | ICD-10-CM | POA: Diagnosis not present

## 2016-05-17 DIAGNOSIS — R0602 Shortness of breath: Secondary | ICD-10-CM | POA: Diagnosis not present

## 2016-05-17 LAB — CBC WITH DIFFERENTIAL/PLATELET
BASOS ABS: 0 10*3/uL (ref 0.0–0.1)
BASOS PCT: 0 %
Eosinophils Absolute: 0.3 10*3/uL (ref 0.0–0.7)
Eosinophils Relative: 4 %
HEMATOCRIT: 48.1 % — AB (ref 36.0–46.0)
Hemoglobin: 16.2 g/dL — ABNORMAL HIGH (ref 12.0–15.0)
LYMPHS PCT: 18 %
Lymphs Abs: 1.5 10*3/uL (ref 0.7–4.0)
MCH: 32.3 pg (ref 26.0–34.0)
MCHC: 33.7 g/dL (ref 30.0–36.0)
MCV: 96 fL (ref 78.0–100.0)
Monocytes Absolute: 0.8 10*3/uL (ref 0.1–1.0)
Monocytes Relative: 9 %
NEUTROS ABS: 5.5 10*3/uL (ref 1.7–7.7)
NEUTROS PCT: 68 %
Platelets: 98 10*3/uL — ABNORMAL LOW (ref 150–400)
RBC: 5.01 MIL/uL (ref 3.87–5.11)
RDW: 13.2 % (ref 11.5–15.5)
WBC: 8.1 10*3/uL (ref 4.0–10.5)

## 2016-05-17 LAB — COMPREHENSIVE METABOLIC PANEL
ALBUMIN: 3.9 g/dL (ref 3.5–5.0)
ALT: 26 U/L (ref 14–54)
AST: 48 U/L — AB (ref 15–41)
Alkaline Phosphatase: 84 U/L (ref 38–126)
Anion gap: 11 (ref 5–15)
BILIRUBIN TOTAL: 0.7 mg/dL (ref 0.3–1.2)
BUN: 5 mg/dL — AB (ref 6–20)
CHLORIDE: 98 mmol/L — AB (ref 101–111)
CO2: 23 mmol/L (ref 22–32)
CREATININE: 0.58 mg/dL (ref 0.44–1.00)
Calcium: 9.5 mg/dL (ref 8.9–10.3)
GFR calc Af Amer: >60 mL/min (ref >60)
GLUCOSE: 115 mg/dL — AB (ref 65–99)
Potassium: 3.8 mmol/L (ref 3.5–5.1)
Sodium: 132 mmol/L — ABNORMAL LOW (ref 135–145)
TOTAL PROTEIN: 7.6 g/dL (ref 6.5–8.1)

## 2016-05-17 LAB — BRAIN NATRIURETIC PEPTIDE: B Natriuretic Peptide: 54.1 pg/mL (ref 0.0–100.0)

## 2016-05-17 LAB — STREP PNEUMONIAE URINARY ANTIGEN: Strep Pneumo Urinary Antigen: NEGATIVE

## 2016-05-17 LAB — I-STAT TROPONIN, ED: TROPONIN I, POC: 0.02 ng/mL (ref 0.00–0.08)

## 2016-05-17 LAB — I-STAT CG4 LACTIC ACID, ED: LACTIC ACID, VENOUS: 1 mmol/L (ref 0.5–1.9)

## 2016-05-17 LAB — LIPASE, BLOOD: LIPASE: 29 U/L (ref 11–51)

## 2016-05-17 MED ORDER — AZITHROMYCIN 500 MG IV SOLR
500.0000 mg | INTRAVENOUS | Status: DC
  Administered 2016-05-18 – 2016-05-20 (×3): 500 mg via INTRAVENOUS
  Filled 2016-05-17 (×4): qty 500

## 2016-05-17 MED ORDER — SODIUM CHLORIDE 0.9 % IV SOLN
INTRAVENOUS | Status: DC
  Administered 2016-05-17: 11:00:00 via INTRAVENOUS

## 2016-05-17 MED ORDER — LORAZEPAM 1 MG PO TABS: 1.0000 mg | ORAL_TABLET | Freq: Four times a day (QID) | ORAL | Status: AC | PRN

## 2016-05-17 MED ORDER — ADULT MULTIVITAMIN W/MINERALS CH
1.0000 | ORAL_TABLET | Freq: Every day | ORAL | Status: DC
  Administered 2016-05-17 – 2016-05-20 (×4): 1 via ORAL
  Filled 2016-05-17 (×4): qty 1

## 2016-05-17 MED ORDER — ALBUTEROL SULFATE (2.5 MG/3ML) 0.083% IN NEBU: 2.5000 mg | INHALATION_SOLUTION | RESPIRATORY_TRACT | Status: DC | PRN

## 2016-05-17 MED ORDER — VITAMIN B-1 100 MG PO TABS
100.0000 mg | ORAL_TABLET | Freq: Every day | ORAL | Status: DC
  Administered 2016-05-17 – 2016-05-20 (×4): 100 mg via ORAL
  Filled 2016-05-17 (×4): qty 1

## 2016-05-17 MED ORDER — DEXTROSE 5 % IV SOLN
500.0000 mg | Freq: Once | INTRAVENOUS | Status: AC
  Administered 2016-05-17: 500 mg via INTRAVENOUS
  Filled 2016-05-17: qty 500

## 2016-05-17 MED ORDER — BISACODYL 5 MG PO TBEC: 5.0000 mg | DELAYED_RELEASE_TABLET | Freq: Every day | ORAL | Status: DC | PRN

## 2016-05-17 MED ORDER — LORAZEPAM 2 MG/ML IJ SOLN: 1.0000 mg | Freq: Four times a day (QID) | INTRAMUSCULAR | Status: AC | PRN

## 2016-05-17 MED ORDER — IPRATROPIUM-ALBUTEROL 0.5-2.5 (3) MG/3ML IN SOLN
3.0000 mL | Freq: Once | RESPIRATORY_TRACT | Status: AC
  Administered 2016-05-17: 3 mL via RESPIRATORY_TRACT
  Filled 2016-05-17: qty 3

## 2016-05-17 MED ORDER — ONDANSETRON HCL 4 MG/2ML IJ SOLN: 4.0000 mg | Freq: Three times a day (TID) | INTRAMUSCULAR | Status: AC | PRN

## 2016-05-17 MED ORDER — ACETAMINOPHEN 325 MG PO TABS: 650.0000 mg | ORAL_TABLET | Freq: Four times a day (QID) | ORAL | Status: DC | PRN

## 2016-05-17 MED ORDER — POLYETHYLENE GLYCOL 3350 17 G PO PACK: 17.0000 g | PACK | Freq: Every day | ORAL | Status: DC | PRN

## 2016-05-17 MED ORDER — MOMETASONE FURO-FORMOTEROL FUM 200-5 MCG/ACT IN AERO
2.0000 | INHALATION_SPRAY | Freq: Two times a day (BID) | RESPIRATORY_TRACT | Status: DC
  Administered 2016-05-17 (×2): 2 via RESPIRATORY_TRACT
  Filled 2016-05-17: qty 8.8

## 2016-05-17 MED ORDER — CEFTRIAXONE SODIUM 1 G IJ SOLR
1.0000 g | INTRAMUSCULAR | Status: DC
  Administered 2016-05-18 – 2016-05-20 (×3): 1 g via INTRAVENOUS
  Filled 2016-05-17 (×4): qty 10

## 2016-05-17 MED ORDER — ENOXAPARIN SODIUM 40 MG/0.4ML ~~LOC~~ SOLN
40.0000 mg | SUBCUTANEOUS | Status: DC
  Administered 2016-05-17 – 2016-05-20 (×4): 40 mg via SUBCUTANEOUS
  Filled 2016-05-17 (×4): qty 0.4

## 2016-05-17 MED ORDER — ATORVASTATIN CALCIUM 10 MG PO TABS
10.0000 mg | ORAL_TABLET | Freq: Every day | ORAL | Status: DC
  Administered 2016-05-17 – 2016-05-19 (×3): 10 mg via ORAL
  Filled 2016-05-17 (×3): qty 1

## 2016-05-17 MED ORDER — IOPAMIDOL (ISOVUE-370) INJECTION 76%
INTRAVENOUS | Status: AC
  Administered 2016-05-17: 100 mL
  Filled 2016-05-17: qty 100

## 2016-05-17 MED ORDER — SPIRONOLACTONE 25 MG PO TABS
25.0000 mg | ORAL_TABLET | Freq: Every day | ORAL | Status: DC
Start: 2016-05-17 — End: 2016-05-20
  Administered 2016-05-17 – 2016-05-20 (×4): 25 mg via ORAL
  Filled 2016-05-17 (×4): qty 1

## 2016-05-17 MED ORDER — FOLIC ACID 1 MG PO TABS
1.0000 mg | ORAL_TABLET | Freq: Every day | ORAL | Status: DC
  Administered 2016-05-17 – 2016-05-20 (×4): 1 mg via ORAL
  Filled 2016-05-17 (×4): qty 1

## 2016-05-17 MED ORDER — ALBUTEROL SULFATE (2.5 MG/3ML) 0.083% IN NEBU
2.5000 mg | INHALATION_SOLUTION | RESPIRATORY_TRACT | Status: AC
  Administered 2016-05-17: 2.5 mg via RESPIRATORY_TRACT
  Filled 2016-05-17 (×3): qty 3

## 2016-05-17 MED ORDER — DEXTROSE 5 % IV SOLN
1.0000 g | INTRAVENOUS | Status: DC
  Filled 2016-05-17: qty 10

## 2016-05-17 MED ORDER — THIAMINE HCL 100 MG/ML IJ SOLN: 100.0000 mg | Freq: Every day | INTRAMUSCULAR | Status: DC

## 2016-05-17 MED ORDER — CARVEDILOL 12.5 MG PO TABS
12.5000 mg | ORAL_TABLET | Freq: Two times a day (BID) | ORAL | Status: DC
  Administered 2016-05-17 – 2016-05-20 (×7): 12.5 mg via ORAL
  Filled 2016-05-17 (×7): qty 1

## 2016-05-17 MED ORDER — METHYLPREDNISOLONE SODIUM SUCC 125 MG IJ SOLR
125.0000 mg | Freq: Once | INTRAMUSCULAR | Status: AC
  Administered 2016-05-17: 125 mg via INTRAVENOUS
  Filled 2016-05-17: qty 2

## 2016-05-17 MED ORDER — DEXTROSE 5 % IV SOLN
500.0000 mg | INTRAVENOUS | Status: DC
  Filled 2016-05-17: qty 500

## 2016-05-17 MED ORDER — ACETAMINOPHEN 650 MG RE SUPP: 650.0000 mg | Freq: Four times a day (QID) | RECTAL | Status: DC | PRN

## 2016-05-17 MED ORDER — DEXTROSE 5 % IV SOLN
1.0000 g | Freq: Once | INTRAVENOUS | Status: AC
  Administered 2016-05-17: 1 g via INTRAVENOUS
  Filled 2016-05-17: qty 10

## 2016-05-17 MED ORDER — ALBUTEROL SULFATE (2.5 MG/3ML) 0.083% IN NEBU
2.5000 mg | INHALATION_SOLUTION | Freq: Once | RESPIRATORY_TRACT | Status: AC
  Administered 2016-05-17: 2.5 mg via RESPIRATORY_TRACT
  Filled 2016-05-17: qty 3

## 2016-05-17 MED ORDER — NITROGLYCERIN 0.4 MG SL SUBL: 0.4000 mg | SUBLINGUAL_TABLET | SUBLINGUAL | Status: DC | PRN

## 2016-05-17 MED ORDER — GUAIFENESIN ER 600 MG PO TB12
600.0000 mg | ORAL_TABLET | Freq: Two times a day (BID) | ORAL | Status: DC
  Administered 2016-05-17 (×2): 600 mg via ORAL
  Filled 2016-05-17 (×2): qty 1

## 2016-05-17 MED ORDER — TRAZODONE HCL 50 MG PO TABS: 25.0000 mg | ORAL_TABLET | Freq: Every evening | ORAL | Status: DC | PRN

## 2016-05-17 MED ORDER — ASPIRIN EC 81 MG PO TBEC
81.0000 mg | DELAYED_RELEASE_TABLET | Freq: Every day | ORAL | Status: DC
  Administered 2016-05-17 – 2016-05-20 (×4): 81 mg via ORAL
  Filled 2016-05-17 (×4): qty 1

## 2016-05-17 NOTE — ED Notes (Signed)
Pt transported to Xray. 

## 2016-05-17 NOTE — Progress Notes (Signed)
Noticed that patient is on both Cozaar and Entresto. Spoke with pharmacy here and they agree that it is unusual to be on both. Heart Failure Pharmacist Pilar Plate contacting [facility] to confirm that patient doesn't need both and he will let us know. For now, holding Cozaar. Entresto be be held today given IV contrast. Should be resumed tomorrow.

## 2016-05-17 NOTE — Progress Notes (Signed)
This is a no charge note  Kieth Brightly on admission per Dr. Betsey Holiday  60 year old lady with past medical history of hypertension, hyperlipidemia, COPD, TIA, sCHF with EF 50-55%, CAD, tobacco abuse, s/p of ICD, who presents with cough, shortness of breath, back pain around right scapular area radiating to central chest. Fund to have oxygen desaturation to 90%. CTA of chest is negative for PE, but showed right lung patchy infiltration. WBC 8.1, temperature 99.9, tachycardia, troponin negative, BNP 54.1, lipase 29, electrolytes and renal function okay. Patient was started with Rocephin and azithromycin for CAP. Lactic acid is pending. Pt is accepted to tele bed as inpt.  Ivor Costa, MD  [facility] Pager [phone]  If 7PM-7AM, please contact night-coverage [email or web address] Password [email or web address] 05/17/2016, 4:03 AM

## 2016-05-17 NOTE — ED Provider Notes (Signed)
[place] DEPT Provider Note   CSN: [id number] Arrival date & time: 05/17/16  0036  By signing my name below, I, Delton Prairie, attest that this documentation has been prepared under the direction and in the presence of Orpah Greek, MD  Electronically Signed: Delton Prairie, ED Scribe. 05/17/16. 12:48 AM.    History   Chief Complaint Chief Complaint  Patient presents with  . Back Pain  . Chest Pain  . Shortness of Breath     The history is provided by the patient. No language interpreter was used.    HPI Comments:  [name] is a 60 y.o. female, with a Hx of CHF, and COPD who presents to the Emergency Department via EMS complaining of acute onset, moderate upper back pain onset 10AM yesterday.Pt reports associated productive cough, SOB and radiation of the back pain into her central chest onset a few hours ago.  Pt states she used her inhaler and a nebulizer with little relief.  Pt states her back pain is exacerbated with movement and her chest pain is exacerbated to the touch. Pt states her current symptoms are not similar to her symptoms from her MI in 2008. Pt has a defibrillator in place. Pt denies abdominal pain.  Past Medical History:  Diagnosis Date  . Automatic implantable cardioverter-defibrillator in situ   . CAD (coronary artery disease)    a. Inf-post MI 2008 s/p BMS to large marginal of Cx.   Marland Kitchen HYPERLIPIDEMIA-MIXED 02/07/2009  . HYPERTENSION, BENIGN 08/22/2009  . PVD (peripheral vascular disease) ([place])    a. Evaluated by Dr. [name] Anon 08/2012.  Marland Kitchen SYSTOLIC HEART FAILURE, CHRONIC 02/07/2009   a. EF 30% 2010, 29% by MRI 08/2012. b. s/p prophylactic Medtronic ICD implantation 10/2012.  Marland Kitchen TIA (transient ischemic attack) 05/18/2014  . Tobacco abuse   . VENTRICULAR TACHYCARDIA 05/21/2010    Patient Active Problem List   Diagnosis Date Noted  . Essential hypertension   . COPD with acute exacerbation ([place]) 12/02/2014  . Systolic heart failure ([place]) 12/02/2014    . Dyspnea 12/02/2014  . Alcohol use ([place]) 12/02/2014  . Elevated hemoglobin ([place]) 12/02/2014  . Acute bronchitis 12/02/2014  . Acute respiratory failure with hypoxia ([place])   . Malnutrition of moderate degree ([place]) 05/19/2014  . Right facial numbness 05/18/2014  . Weakness of right lower extremity 05/18/2014  . TIA (transient ischemic attack) 05/18/2014  . 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  . Smoker 08/11/2012  . PVD (peripheral vascular disease) ([place]) 08/11/2012  . VENTRICULAR TACHYCARDIA 05/21/2010  . BEN HTN HEART DISEASE WITHOUT HEART FAIL 08/28/2009  . HYPERTENSION, BENIGN 08/22/2009  . CHEST PAIN-PRECORDIAL 07/18/2009  . CARDIOVASCULAR FUNCTION STUDY, ABNORMAL 07/18/2009  . Elevated lipids 02/07/2009  . CAD, NATIVE VESSEL 02/07/2009  . Congestive dilated cardiomyopathy ([place]) 02/07/2009  . SYSTOLIC HEART FAILURE, CHRONIC 02/07/2009    Past Surgical History:  Procedure Laterality Date  . CARDIAC DEFIBRILLATOR PLACEMENT  10/2012  . CESAREAN SECTION  1977; 1982  . CYSTOSCOPY W/ STONE MANIPULATION  1990's  . IMPLANTABLE CARDIOVERTER DEFIBRILLATOR IMPLANT N/A 10/21/2012   Procedure: IMPLANTABLE CARDIOVERTER DEFIBRILLATOR IMPLANT;  Surgeon: Evans Lance, MD;  Location: [facility] CATH LAB;  Service: Cardiovascular;  Laterality: N/A;    OB History    No data available       Home Medications    Prior to Admission medications   Medication Sig Start Date End Date Taking? Authorizing Provider  acetaminophen (TYLENOL) 500 MG tablet Take 500 mg by mouth every 6 (  six) hours as needed for headache.     Historical Provider, MD  aspirin 81 MG tablet Take 81 mg by mouth daily.    Historical Provider, MD  atorvastatin (LIPITOR) 10 MG tablet Take 1 tablet (10 mg total) by mouth daily at 6 PM. 06/20/14   [name], MD  budesonide-formoterol [facility]) 160-4.5 MCG/ACT inhaler Inhale 2 puffs into the lungs 2 (two) times daily. 12/15/14 12/15/15  Historical  Provider, MD  carvedilol (COREG) 12.5 MG tablet TAKE 1 TABLET (12.5 MG TOTAL) BY MOUTH 2 (TWO) TIMES DAILY WITH A MEAL. 08/29/15   [name], MD  ENTRESTO 97-103 MG TAKE 1 TABLET BY MOUTH 2 (TWO) TIMES DAILY. 05/10/16   Jolaine Artist, MD  feeding supplement, ENSURE COMPLETE, (ENSURE COMPLETE) LIQD Take 237 mLs by mouth daily. 05/19/14   Maryann Mikhail, DO  guaiFENesin (MUCINEX) 600 MG 12 hr tablet Take 2 tablets (1,200 mg total) by mouth 2 (two) times daily as needed for cough or to loosen phlegm. 12/03/14   Robbie Lis, MD  ipratropium (ATROVENT) 0.02 % nebulizer solution Inhale 0.5 mg into the lungs 4 (four) times daily as needed. wheezing 12/15/14 12/15/15  Historical Provider, MD  losartan (COZAAR) 100 MG tablet TAKE 1 TABLET BY MOUTH DAILY 10/09/15   [name], MD  nitroGLYCERIN (NITROSTAT) 0.4 MG SL tablet Place 1 tablet (0.4 mg total) under the tongue every 5 (five) minutes as needed for chest pain (up to 3 doses). 06/20/14   [name], MD  PROAIR HFA 108 [phone] BASE) MCG/ACT inhaler Inhale 2 puffs into the lungs every 6 (six) hours as needed. Shortness of breath or wheezing 11/28/14   Historical Provider, MD  spironolactone (ALDACTONE) 25 MG tablet Take 25 mg by mouth daily.    Historical Provider, MD    Family History Family History  Problem Relation Age of Onset  . Cancer Mother     cervical  . Heart attack Father   . Asthma Brother   . Other Brother     Pacemaker    Social History Social History  Substance Use Topics  . Smoking status: Current Every Day Smoker    Packs/day: 0.50    Years: 40.00    Types: Cigarettes  . Smokeless tobacco: Never Used  . Alcohol use 12.0 oz/week    20 Cans of beer per week     Comment: 05/18/2014 "2, 40's qod"     Allergies   Clopidogrel bisulfate   Review of Systems Review of Systems  Respiratory: Positive for shortness of breath.   Cardiovascular: Positive for chest pain. Negative for leg swelling.  Gastrointestinal:  Negative for abdominal pain.  Musculoskeletal: Positive for back pain.  All other systems reviewed and are negative.    Physical Exam Updated Vital Signs BP 137/79   Pulse (!) 48   Temp 99.9 F (37.7 C) (Oral)   Resp (!) 32   Ht 5\' 9"  (1.753 m)   Wt 140 lb (63.5 kg)   SpO2 95%   BMI 20.67 kg/m   Physical Exam  Constitutional: She is oriented to person, place, and time. She appears well-developed and well-nourished. No distress.  HENT:  Head: Normocephalic and atraumatic.  Right Ear: Hearing normal.  Left Ear: Hearing normal.  Nose: Nose normal.  Mouth/Throat: Oropharynx is clear and moist and mucous membranes are normal.  Eyes: Conjunctivae and EOM are normal. Pupils are equal, round, and reactive to light.  Neck: Normal range of motion. Neck  supple.  Cardiovascular: Regular rhythm, S1 normal and S2 normal.  Exam reveals no gallop and no friction rub.   No murmur heard. Pulmonary/Chest: Effort normal. No respiratory distress. She exhibits no tenderness.  Lung little diminished but no wheezing   Abdominal: Soft. Normal appearance and bowel sounds are normal. There is no hepatosplenomegaly. There is no tenderness. There is no rebound, no guarding, no tenderness at McBurney's point and negative Murphy's sign. No hernia.  Musculoskeletal: Normal range of motion.  Neurological: She is alert and oriented to person, place, and time. She has normal strength. No cranial nerve deficit or sensory deficit. Coordination normal. GCS eye subscore is 4. GCS verbal subscore is 5. GCS motor subscore is 6.  Skin: Skin is warm, dry and intact. No rash noted. No cyanosis.  Psychiatric: She has a normal mood and affect. Her speech is normal and behavior is normal. Thought content normal.  Nursing note and vitals reviewed.    ED Treatments / Results  DIAGNOSTIC STUDIES:  Oxygen Saturation is 93% on RA, adequate by my interpretation.    COORDINATION OF CARE:  12:42 AM Discussed treatment  plan with pt at bedside and pt agreed to plan.  Labs (all labs ordered are listed, but only abnormal results are displayed) Labs Reviewed  CBC WITH DIFFERENTIAL/PLATELET - Abnormal; Notable for the following:       Result Value   Hemoglobin 16.2 (*)    HCT 48.1 (*)    Platelets 98 (*)    All other components within normal limits  COMPREHENSIVE METABOLIC PANEL - Abnormal; Notable for the following:    Sodium 132 (*)    Chloride 98 (*)    Glucose, Bld 115 (*)    BUN 5 (*)    AST 48 (*)    All other components within normal limits  CULTURE, BLOOD (ROUTINE X 2)  CULTURE, BLOOD (ROUTINE X 2)  LIPASE, BLOOD  BRAIN NATRIURETIC PEPTIDE  I-STAT CG4 LACTIC ACID, ED  I-STAT TROPOININ, ED  I-STAT CG4 LACTIC ACID, ED    EKG  EKG Interpretation  Date/Time:  Friday May 17 2016 00:38:43 EDT Ventricular Rate:  95 PR Interval:    QRS Duration: 111 QT Interval:  390 QTC Calculation: 491 R Axis:   -20 Text Interpretation:  Sinus rhythm Multiform ventricular premature complexes Left ventricular hypertrophy Low voltage, extremity leads Borderline prolonged QT interval Baseline wander in lead(s) V2 No significant change since last tracing Confirmed by Tanveer Dobberstein  MD, Mariette Cowley [id number]) on 05/17/2016 1:06:00 AM       Radiology Dg Chest 2 View  Result Date: 05/17/2016 CLINICAL DATA:  60 year old female with chest pain and shortness of breath EXAM: CHEST  2 VIEW COMPARISON:  Chest radiograph dated 07/1915 FINDINGS: Two views of the chest demonstrate emphysematous changes of the lungs with diffuse interstitial coarsening. Bibasilar linear atelectasis/ scarring noted. There is no focal consolidation, pleural effusion, or pneumothorax. The cardiac silhouette is within normal limits. Left pectoral AICD device. No acute osseous pathology. IMPRESSION: Emphysema.  No focal consolidation or pneumothorax. Electronically Signed   By: Anner Crete M.D.   On: 05/17/2016 01:43   Ct Angio Chest Pe W Or  Wo Contrast  Result Date: 05/17/2016 CLINICAL DATA:  Acute onset of shortness breath and generalized chest pain. Initial encounter. EXAM: CT ANGIOGRAPHY CHEST WITH CONTRAST TECHNIQUE: Multidetector CT imaging of the chest was performed using the standard protocol during bolus administration of intravenous contrast. Multiplanar CT image reconstructions and MIPs were obtained to evaluate  the vascular anatomy. CONTRAST:  100 mL of Isovue 370 IV contrast COMPARISON:  CTA of the chest performed 03/05/2010, and chest radiograph performed earlier today at 12:48 a.m. FINDINGS: There is no evidence of pulmonary embolus. Mild patchy opacity is noted at the right lung base, raising concern for a mild infectious process. Mild bilateral emphysematous change is noted, more prominent at the upper lung lobes. There is no evidence of pleural effusion or pneumothorax. No masses are identified; no abnormal focal contrast enhancement is seen. Scattered coronary artery calcifications are seen. Mild calcification is noted along the aortic arch. The great vessels are grossly unremarkable. No pericardial effusion is seen. No mediastinal lymphadenopathy is appreciated. A left-sided AICD is noted, with a single lead ending at the right ventricle. No axillary lymphadenopathy is seen. The visualized portions of the thyroid gland are unremarkable in appearance. The visualized portions of the liver and spleen are unremarkable. The patient is status cholecystectomy, with clips noted at the gallbladder fossa. No acute osseous abnormalities are seen. Review of the MIP images confirms the above findings. IMPRESSION: 1. No evidence of pulmonary embolus. 2. Mild patchy opacity at the right lung base, concerning for a mild infectious process. 3. Mild bilateral emphysematous change, more prominent at the upper lung lobes. 4. Scattered coronary artery calcifications seen. Electronically Signed   By: Garald Balding M.D.   On: 05/17/2016 03:26     Procedures Procedures (including critical care time)  Medications Ordered in ED Medications  cefTRIAXone (ROCEPHIN) 1 g in dextrose 5 % 50 mL IVPB (not administered)  azithromycin (ZITHROMAX) 500 mg in dextrose 5 % 250 mL IVPB (not administered)  methylPREDNISolone sodium succinate (SOLU-MEDROL) 125 mg/2 mL injection 125 mg (not administered)  ipratropium-albuterol (DUONEB) 0.5-2.5 (3) MG/3ML nebulizer solution 3 mL (not administered)  albuterol (PROVENTIL) (2.5 MG/3ML) 0.083% nebulizer solution 2.5 mg (not administered)  iopamidol (ISOVUE-370) 76 % injection (100 mLs  Contrast Given 05/17/16 0245)     Initial Impression / Assessment and Plan / ED Course  I have reviewed the triage vital signs and the nursing notes.  Pertinent labs & imaging results that were available during my care of the patient were reviewed by me and considered in my medical decision making (see chart for details).  Clinical Course   Patient presents to the emergency department for evaluation of chest pain, back pain and shortness of breath. She has been experiencing cough that is productive of sputum as well. Patient has a history of COPD as well as significant coronary artery disease and ischemic cardiomyopathy. She does not appear to be experiencing decompensation of congestive heart failure clinically. Chest x-ray was clear, no abnormality including no evidence of edema. EKG unchanged from previous. Troponin negative. BNP normal.  Because patient has been experiencing chest and back pain, as well as shortness of breath, PE and aortic dissection were considered. CT angiography performed. This reveals right-sided pneumonia. This is likely causing the patient's symptoms. Upon rechecking her she has to, breathing nearly 40 times per minute. She also is experiencing mild tachycardia in the 105-110 range. No hypotension. She has been on nasal cannula oxygen here in the ER. She does not use oxygen at home. Recheck of  room air saturation was 90% at rest here in the ER. Patient will require hospitalization for further management of community-acquired pneumonia in the setting of COPD.  Patient administered to IV Rocephin, IV Zithromax, IV Solu-Medrol. She was also initiated on the dilator therapy in the ER.  Final Clinical  Impressions(s) / ED Diagnoses   Final diagnoses:  COPD exacerbation ([place])  CAP (community acquired pneumonia)    New Prescriptions New Prescriptions   No medications on file  I personally performed the services described in this documentation, which was scribed in my presence. The recorded information has been reviewed and is accurate.      Orpah Greek, MD 05/17/16 [phone]

## 2016-05-17 NOTE — Progress Notes (Signed)
   AddendumPilar Plate in Pharmacy contacted [facility] - spoke with Dr. Marigene Ehlers who wants the Cozaar discontinued. Patient shouldn't be on both.   Noticed that patient is on both Cozaar and Entresto. Spoke with pharmacy here and they agree that it is unusual to be on both. Heart Failure Pharmacist Pilar Plate contacting [facility] Failure Clinic to confirm that patient doesn't need both and he will let us know. For now, holding Cozaar. Entresto be be held today given IV contrast. Should be resumed tomorrow

## 2016-05-17 NOTE — H&P (Signed)
History and Physical    LORAN RIGOLI [id number] DOB: [birth year] DOA: 05/17/2016  PCP: Vicenta Aly, FNP  Patient coming from:   Home   Chief Complaint: chest / back pain, sob, cough  HPI: [name] is a 61 y.o. female with a multiple medical problems not limited to CAD s/p MI 2008, , VT, ischemic cardiomyopathy with chronic systolic heart failure status post ICD placement, hyperlipidemia, hypertension and COPD. Patient presented to the emergency department around midnight with right scapular pain, shortness of breath and cough. Cough started two days ago, productive of green phlegm. Scapular pain and shortness of breath woke her up yesterday mornring.  This morning she developed right sided chest pain like something was sitting on chest and worse with coughing. No fevers / chills. Pain totally resolved now. Mpw fever or chills.   Uses Nebulizers at home as needed which is seldom. Stopped smoking two weeks ago but had one cigarette Tuesday.    ED Course:  Temp 99.9, normotensive. Tachypnea with respirations in the thirties, O2 saturation mid nineties on O2 per nasal cannula Brought in by EMS. O2 saturation 98% on room air, started on 4 L O2 per nasal cannula, O2 sat increased to 94%. BMP unremarkable, troponin negative. Lactic acid 1.0, normal WBC, hemoglobin 16 point 2, platelets 98  RX: Duoneb, solumedrol, rocephin and zithromax CT of the chest shows right-sided pneumonia Rocephin, Zithromax, Solu-Medrol and nebulizers.   Review of Systems: As per HPI, otherwise 10 point review of systems negative.    Past Medical History:  Diagnosis Date  . Automatic implantable cardioverter-defibrillator in situ   . CAD (coronary artery disease)    a. Inf-post MI 2008 s/p BMS to large marginal of Cx.   Marland Kitchen HYPERLIPIDEMIA-MIXED 02/07/2009  . HYPERTENSION, BENIGN 08/22/2009  . PVD (peripheral vascular disease) ([place])    a. Evaluated by Dr. [name] Anon 08/2012.  Marland Kitchen SYSTOLIC HEART FAILURE,  CHRONIC 02/07/2009   a. EF 30% 2010, 29% by MRI 08/2012. b. s/p prophylactic Medtronic ICD implantation 10/2012.  Marland Kitchen TIA (transient ischemic attack) 05/18/2014  . Tobacco abuse   . VENTRICULAR TACHYCARDIA 05/21/2010    Past Surgical History:  Procedure Laterality Date  . CARDIAC DEFIBRILLATOR PLACEMENT  10/2012  . CESAREAN SECTION  1977; 1982  . CYSTOSCOPY W/ STONE MANIPULATION  1990's  . IMPLANTABLE CARDIOVERTER DEFIBRILLATOR IMPLANT N/A 10/21/2012   Procedure: IMPLANTABLE CARDIOVERTER DEFIBRILLATOR IMPLANT;  Surgeon: Evans Lance, MD;  Location: [facility] CATH LAB;  Service: Cardiovascular;  Laterality: N/A;    Social History   Social History  . Marital status: Single    Spouse name: N/A  . Number of children: N/A  . Years of education: N/A   Occupational History  . Not on file.   Social History Main Topics  . Smoking status: Current Every Day Smoker    Packs/day: 0.50    Years: 40.00    Types: Cigarettes  . Smokeless tobacco: Never Used  . Alcohol use 12.0 oz/week    20 Cans of beer per week     Comment: 05/18/2014 "2, 40's qod"  . Drug use: No  . Sexual activity: Yes   Other Topics Concern  . Not on file   Social History Narrative  . No narrative on file    Allergies  Allergen Reactions  . Clopidogrel Bisulfate Rash    Family History  Problem Relation Age of Onset  . Cancer Mother     cervical  . Heart attack Father   . Asthma  Brother   . Other Brother     Pacemaker     Prior to Admission medications   Medication Sig Start Date End Date Taking? Authorizing Provider  acetaminophen (TYLENOL) 500 MG tablet Take 500 mg by mouth every 6 (six) hours as needed for headache.    Yes Historical Provider, MD  aspirin 81 MG tablet Take 81 mg by mouth daily.   Yes Historical Provider, MD  atorvastatin (LIPITOR) 10 MG tablet Take 1 tablet (10 mg total) by mouth daily at 6 PM. 06/20/14  Yes [name], MD  budesonide-formoterol [facility]) 160-4.5 MCG/ACT inhaler Inhale 2  puffs into the lungs 2 (two) times daily. 12/15/14 05/17/16 Yes Historical Provider, MD  carvedilol (COREG) 12.5 MG tablet TAKE 1 TABLET (12.5 MG TOTAL) BY MOUTH 2 (TWO) TIMES DAILY WITH A MEAL. 08/29/15  Yes [name], MD  ENTRESTO 97-103 MG TAKE 1 TABLET BY MOUTH 2 (TWO) TIMES DAILY. 05/10/16  Yes Jolaine Artist, MD  losartan (COZAAR) 100 MG tablet TAKE 1 TABLET BY MOUTH DAILY 10/09/15  Yes [name], MD  nitroGLYCERIN (NITROSTAT) 0.4 MG SL tablet Place 1 tablet (0.4 mg total) under the tongue every 5 (five) minutes as needed for chest pain (up to 3 doses). 06/20/14  Yes [name], MD  PROAIR HFA 108 (90 BASE) MCG/ACT inhaler Inhale 2 puffs into the lungs every 6 (six) hours as needed. Shortness of breath or wheezing 11/28/14  Yes Historical Provider, MD  spironolactone (ALDACTONE) 25 MG tablet Take 25 mg by mouth daily.   Yes Historical Provider, MD  feeding supplement, ENSURE COMPLETE, (ENSURE COMPLETE) LIQD Take 237 mLs by mouth daily. Patient not taking: Reported on 05/17/2016 05/19/14   Velta Addison Mikhail, DO  guaiFENesin (MUCINEX) 600 MG 12 hr tablet Take 2 tablets (1,200 mg total) by mouth 2 (two) times daily as needed for cough or to loosen phlegm. Patient not taking: Reported on 05/17/2016 12/03/14   Robbie Lis, MD    Physical Exam: Vitals:   05/17/16 0400 05/17/16 0445 05/17/16 0500 05/17/16 0536  BP: 123/75 140/85 140/82 (!) 141/65  Pulse: 105 99 99 79  Resp: (!) 29 (!) 28  20  Temp:    98.3 F (36.8 C)  TempSrc:    Oral  SpO2: 94% 98% 96% 94%  Weight:    60.7 kg (133 lb 14.4 oz)  Height:    5\' 9"  (1.753 m)    Constitutional:  NAD, calm, comfortable Vitals:   05/17/16 0400 05/17/16 0445 05/17/16 0500 05/17/16 0536  BP: 123/75 140/85 140/82 (!) 141/65  Pulse: 105 99 99 79  Resp: (!) 29 (!) 28  20  Temp:    98.3 F (36.8 C)  TempSrc:    Oral  SpO2: 94% 98% 96% 94%  Weight:    60.7 kg (133 lb 14.4 oz)  Height:    5\' 9"  (1.753 m)   Eyes: PER, lids and  conjunctivae normal ENMT: Mucous membranes are moist. Posterior pharynx clear of any exudate or lesions..  Neck: normal, supple, no masses Respiratory: clear to auscultation bilaterally, no wheezing, no crackles. Normal respiratory effort. No accessory muscle use.  Cardiovascular: Regular rate and rhythm, no murmurs / rubs / gallops. No extremity edema. 2+ dorsal pedis pulses.   Abdomen: no tenderness, no masses palpated. No hepatomegaly. Bowel sounds positive.  Musculoskeletal: no clubbing / cyanosis. No joint deformity upper and lower extremities. Good ROM, no contractures. Normal muscle tone.  Skin: no rashes, lesions,  ulcers.  Neurologic: CN 2-12 grossly intact. Sensation intact, Strength 5/5 in all 4.  Psychiatric: Normal judgment and insight. Alert and oriented x 3. Normal mood.   Labs on Admission: I have personally reviewed following labs and imaging studies   Urine analysis:    Component Value Date/Time   COLORURINE YELLOW 03/04/2010 2317   APPEARANCEUR CLEAR 03/04/2010 2317   LABSPEC 1.006 03/04/2010 2317   PHURINE 5.5 03/04/2010 2317   GLUCOSEU NEGATIVE 03/04/2010 2317   HGBUR NEGATIVE 03/04/2010 2317   BILIRUBINUR NEGATIVE 03/04/2010 2317   KETONESUR NEGATIVE 03/04/2010 2317   PROTEINUR NEGATIVE 03/04/2010 2317   UROBILINOGEN 0.2 03/04/2010 2317   NITRITE NEGATIVE 03/04/2010 2317   LEUKOCYTESUR  03/04/2010 2317    NEGATIVE MICROSCOPIC NOT DONE ON URINES WITH NEGATIVE PROTEIN, BLOOD, LEUKOCYTES, NITRITE, OR GLUCOSE <1000 mg/dL.    Radiological Exams on Admission: Dg Chest 2 View  Result Date: 05/17/2016 CLINICAL DATA:  60 year old female with chest pain and shortness of breath EXAM: CHEST  2 VIEW COMPARISON:  Chest radiograph dated 07/1915 FINDINGS: Two views of the chest demonstrate emphysematous changes of the lungs with diffuse interstitial coarsening. Bibasilar linear atelectasis/ scarring noted. There is no focal consolidation, pleural effusion, or pneumothorax. The  cardiac silhouette is within normal limits. Left pectoral AICD device. No acute osseous pathology. IMPRESSION: Emphysema.  No focal consolidation or pneumothorax. Electronically Signed   By: Anner Crete M.D.   On: 05/17/2016 01:43   Ct Angio Chest Pe W Or Wo Contrast  Result Date: 05/17/2016 CLINICAL DATA:  Acute onset of shortness breath and generalized chest pain. Initial encounter. EXAM: CT ANGIOGRAPHY CHEST WITH CONTRAST TECHNIQUE: Multidetector CT imaging of the chest was performed using the standard protocol during bolus administration of intravenous contrast. Multiplanar CT image reconstructions and MIPs were obtained to evaluate the vascular anatomy. CONTRAST:  100 mL of Isovue 370 IV contrast COMPARISON:  CTA of the chest performed 03/05/2010, and chest radiograph performed earlier today at 12:48 a.m. FINDINGS: There is no evidence of pulmonary embolus. Mild patchy opacity is noted at the right lung base, raising concern for a mild infectious process. Mild bilateral emphysematous change is noted, more prominent at the upper lung lobes. There is no evidence of pleural effusion or pneumothorax. No masses are identified; no abnormal focal contrast enhancement is seen. Scattered coronary artery calcifications are seen. Mild calcification is noted along the aortic arch. The great vessels are grossly unremarkable. No pericardial effusion is seen. No mediastinal lymphadenopathy is appreciated. A left-sided AICD is noted, with a single lead ending at the right ventricle. No axillary lymphadenopathy is seen. The visualized portions of the thyroid gland are unremarkable in appearance. The visualized portions of the liver and spleen are unremarkable. The patient is status cholecystectomy, with clips noted at the gallbladder fossa. No acute osseous abnormalities are seen. Review of the MIP images confirms the above findings. IMPRESSION: 1. No evidence of pulmonary embolus. 2. Mild patchy opacity at the right  lung base, concerning for a mild infectious process. 3. Mild bilateral emphysematous change, more prominent at the upper lung lobes. 4. Scattered coronary artery calcifications seen. Electronically Signed   By: Garald Balding M.D.   On: 05/17/2016 03:26    EKG: Independently reviewed.   EKG Interpretation  Date/Time:  Friday May 17 2016 00:38:43 EDT Ventricular Rate:  95 PR Interval:    QRS Duration: 111 QT Interval:  390 QTC Calculation: 491 R Axis:   -20 Text Interpretation:  Sinus rhythm Multiform ventricular  premature complexes Left ventricular hypertrophy Low voltage, extremity leads Borderline prolonged QT interval Baseline wander in lead(s) V2 No significant change since last tracing Confirmed by POLLINA  MD, Kermit [phone]) on 05/17/2016 1:06:00 AM        Assessment/Plan   Active Problems:   CAP (community acquired pneumonia)      CAP, RLL. Presented with right chest pain / right scapular pain / productive cough.  Chest CTA negative  Chest pain resolved. CTA negative for PE    -Admited to tele  -PNA order set utilized.  -Follow up on blood culture, sputum Gram stain and culture, strep pneumo urinary antigen, HIV antibody -D/C solumedrol (given in ED) -continue Azithro / Rocephin  CAD hx of stenting / chronic systolic heart failure s/p ICD. EF 50-55% improved on last echo Aug 2016.  -continue baby asa, statin -continue Coreg -hold entresto and Cozaar today. Gentle IV hydration after IV contrast.     COPD, hasn't totally stopped smoking yet. Uses inhalers / nebs at home prn  Hx of tobacco abuse, quit 2 weeks ago except for one cigarette on Tuesday. Understands importance of discontinuing tobacco, states she can quick on her own .   PAD, follows with Dr. [name] Anon.   ETOH use, drinks two 40 oz beer daily . -CIWA  HTN, controlled. -resume home Cozaar tomorrow  Hyperlipidemia -continue home statin  Weight loss per patient. By outpatient weights she has been  stable at 133 pounds since June.  Doesn't like Ensure, causes diarrhea.                         DVT prophylaxis:   Lovenox     Code Status:     Full code Family Communication:    None Disposition Plan:   Discharge home in 24-48 hours              Consults called:  None Admission status:    Admission- Medical bed  Tye Savoy NP [facility] Pager [phone]  If 7PM-7AM, please contact night-coverage [email or web address] Password [email or web address]  05/17/2016, 7:13 AM

## 2016-05-17 NOTE — ED Triage Notes (Signed)
Pt BIB EMS from home. States she woke up with back pain around her R scapula, approx 1030 this past morning. Pain has been constant all day. Became SOB and pain began to radiate to central chest 2-3hrs ago. Pt took 2puffs from albuterol inhaler and 2.5mg  albuterol from nebulizer at home. EMS reported SpO2 at 90% on RA, placed her on 4L via [place], which incr'd to 94%. Pt has prior hx of COPD, MI (2008). Has ICD in L chest.

## 2016-05-17 NOTE — ED Notes (Signed)
Patient transported to CT 

## 2016-05-18 DIAGNOSIS — Z789 Other specified health status: Secondary | ICD-10-CM

## 2016-05-18 DIAGNOSIS — I10 Essential (primary) hypertension: Secondary | ICD-10-CM

## 2016-05-18 DIAGNOSIS — I2510 Atherosclerotic heart disease of native coronary artery without angina pectoris: Secondary | ICD-10-CM

## 2016-05-18 DIAGNOSIS — I119 Hypertensive heart disease without heart failure: Secondary | ICD-10-CM

## 2016-05-18 DIAGNOSIS — J189 Pneumonia, unspecified organism: Secondary | ICD-10-CM

## 2016-05-18 DIAGNOSIS — I739 Peripheral vascular disease, unspecified: Secondary | ICD-10-CM

## 2016-05-18 LAB — BASIC METABOLIC PANEL
ANION GAP: 7 (ref 5–15)
BUN: 6 mg/dL (ref 6–20)
CALCIUM: 9.5 mg/dL (ref 8.9–10.3)
CO2: 26 mmol/L (ref 22–32)
Chloride: 106 mmol/L (ref 101–111)
Creatinine, Ser: 0.57 mg/dL (ref 0.44–1.00)
Glucose, Bld: 96 mg/dL (ref 65–99)
POTASSIUM: 4.2 mmol/L (ref 3.5–5.1)
SODIUM: 139 mmol/L (ref 135–145)

## 2016-05-18 LAB — EXPECTORATED SPUTUM ASSESSMENT W REFEX TO RESP CULTURE

## 2016-05-18 LAB — CBC
HEMATOCRIT: 45.3 % (ref 36.0–46.0)
HEMOGLOBIN: 15.4 g/dL — AB (ref 12.0–15.0)
MCH: 33.2 pg (ref 26.0–34.0)
MCHC: 34 g/dL (ref 30.0–36.0)
MCV: 97.6 fL (ref 78.0–100.0)
Platelets: 94 10*3/uL — ABNORMAL LOW (ref 150–400)
RBC: 4.64 MIL/uL (ref 3.87–5.11)
RDW: 13.5 % (ref 11.5–15.5)
WBC: 10.5 10*3/uL (ref 4.0–10.5)

## 2016-05-18 LAB — HIV ANTIBODY (ROUTINE TESTING W REFLEX): HIV Screen 4th Generation wRfx: NONREACTIVE

## 2016-05-18 MED ORDER — IPRATROPIUM-ALBUTEROL 0.5-2.5 (3) MG/3ML IN SOLN
3.0000 mL | Freq: Four times a day (QID) | RESPIRATORY_TRACT | Status: DC
  Administered 2016-05-18 (×3): 3 mL via RESPIRATORY_TRACT
  Filled 2016-05-18 (×3): qty 3

## 2016-05-18 MED ORDER — GUAIFENESIN ER 600 MG PO TB12
1200.0000 mg | ORAL_TABLET | Freq: Two times a day (BID) | ORAL | Status: DC
  Administered 2016-05-18 – 2016-05-20 (×5): 1200 mg via ORAL
  Filled 2016-05-18 (×5): qty 2

## 2016-05-18 MED ORDER — IPRATROPIUM-ALBUTEROL 0.5-2.5 (3) MG/3ML IN SOLN
3.0000 mL | Freq: Three times a day (TID) | RESPIRATORY_TRACT | Status: DC
  Administered 2016-05-19 – 2016-05-20 (×5): 3 mL via RESPIRATORY_TRACT
  Filled 2016-05-18 (×5): qty 3

## 2016-05-18 MED ORDER — IPRATROPIUM-ALBUTEROL 0.5-2.5 (3) MG/3ML IN SOLN: 3.0000 mL | RESPIRATORY_TRACT | Status: DC | PRN

## 2016-05-18 MED ORDER — BUDESONIDE 0.25 MG/2ML IN SUSP
0.2500 mg | Freq: Two times a day (BID) | RESPIRATORY_TRACT | Status: DC
  Administered 2016-05-18 – 2016-05-20 (×5): 0.25 mg via RESPIRATORY_TRACT
  Filled 2016-05-18 (×5): qty 2

## 2016-05-18 MED ORDER — FLUTICASONE PROPIONATE 50 MCG/ACT NA SUSP
2.0000 | Freq: Every day | NASAL | Status: DC
  Administered 2016-05-18 – 2016-05-20 (×3): 2 via NASAL
  Filled 2016-05-18: qty 16

## 2016-05-18 NOTE — Progress Notes (Signed)
PROGRESS NOTE    [name]  [id number] DOB: 05-30-1956 DOA: 05/17/2016 PCP: Vicenta Aly, FNP    Brief Narrative:  [name] is a 60 y.o. female with a multiple medical problems not limited to CAD s/p MI 2008, , VT, ischemic cardiomyopathy with chronic systolic heart failure status post ICD placement, hyperlipidemia, hypertension and COPD. Patient presented to the emergency department around midnight with right scapular pain, shortness of breath and cough. Cough started two days ago, productive of green phlegm. Scapular pain and shortness of breath woke her up yesterday mornring.  This morning she developed right sided chest pain like something was sitting on chest and worse with coughing. No fevers / chills. Pain totally resolved now. Mpw fever or chills.   Uses Nebulizers at home as needed which is seldom. Stopped smoking two weeks ago but had one cigarette Tuesday.      Assessment & Plan:   Principal Problem:   CAP (community acquired pneumonia) Active Problems:   HYPERTENSION, BENIGN   BEN HTN HEART DISEASE WITHOUT HEART FAIL   CAD, NATIVE VESSEL   PVD (peripheral vascular disease) ([place])   Automatic implantable cardioverter-defibrillator in situ   Alcohol use ([place])   Essential hypertension  #1 community acquired pneumonia/RLL Per CT chest. Patient with some clinical improvement. Sputum Gram stain and culture pending. Blood cultures pending. Patient currently afebrile. Continue empiric IV Rocephin and azithromycin, oxygen. Increase Mucinex to 1200 mg twice daily. We'll place on Pulmicort and scheduled nebulizers as well as Flonase due to patient's history of concurrent COPD with ongoing tobacco abuse. Follow.  #2 COPD Stable. Will place patient on Pulmicort,duonebs, flonase. Follow.  #3 hypertension Stable. Continue spironolactone and Coreg.  #4 coronary artery disease/peripheral vascular disease/status post ICD/chronic systolic heart failure Stable.  Patient denies any chest pain. Continue aspirin, Lipitor, Coreg, spironolactone.  #5 alcohol use Stable. Patient with no signs of withdrawal. Continue folic acid, thiamine and Ativan withdrawal protocol.  #6 tobacco abuse Tobacco cessation.  #7 hyperlipidemia Continue statin.    DVT prophylaxis: Lovenox Code Status: Full Family Communication: Updated patient. No family at bedside. Disposition Plan: Home when medically stable hopefully in 2-3 days.   Consultants:   None  Procedures:   Chest x-ray 05/17/2016  CT angiogram chest 05/17/2016  Antimicrobials:   IV Azithromycin 05/17/2016  IV Rocephin 05/17/2016   Subjective: Patient states cough and shortness of breath have improved since admission. Patient denies any chest pain.  Objective: Vitals:   05/17/16 2100 05/18/16 0524 05/18/16 0930 05/18/16 0932  BP:  (!) 136/92    Pulse:  85    Resp:  20    Temp:  98.2 F (36.8 C)    TempSrc:  Oral    SpO2: 98% 93% 92% 92%  Weight:  61.1 kg (134 lb 11.2 oz)    Height:       No intake or output data in the 24 hours ending 05/18/16 1317 Filed Weights   05/17/16 0048 05/17/16 0536 05/18/16 0524  Weight: 63.5 kg (140 lb) 60.7 kg (133 lb 14.4 oz) 61.1 kg (134 lb 11.2 oz)    Examination:  General exam: Appears calm and comfortable  Respiratory system: Clear to auscultation. Respiratory effort normal. Cardiovascular system: S1 & S2 heard, RRR. No JVD, murmurs, rubs, gallops or clicks. No pedal edema. Gastrointestinal system: Abdomen is nondistended, soft and nontender. No organomegaly or masses felt. Normal bowel sounds heard. Central nervous system: Alert and oriented. No focal neurological deficits. Extremities: Symmetric 5 x  5 power. Skin: No rashes, lesions or ulcers Psychiatry: Judgement and insight appear normal. Mood & affect appropriate.     Data Reviewed: I have personally reviewed following labs and imaging studies  CBC:  Recent Labs Lab  05/17/16 0045 05/18/16 0403  WBC 8.1 10.5  NEUTROABS 5.5  --   HGB 16.2* 15.4*  HCT 48.1* 45.3  MCV 96.0 97.6  PLT 98* 94*   Basic Metabolic Panel:  Recent Labs Lab 05/17/16 0045 05/18/16 0403  NA 132* 139  K 3.8 4.2  CL 98* 106  CO2 23 26  GLUCOSE 115* 96  BUN 5* 6  CREATININE 0.58 0.57  CALCIUM 9.5 9.5   GFR: Estimated Creatinine Clearance: 73.9 mL/min (by C-G formula based on SCr of 0.8 mg/dL). Liver Function Tests:  Recent Labs Lab 05/17/16 0045  AST 48*  ALT 26  ALKPHOS 84  BILITOT 0.7  PROT 7.6  ALBUMIN 3.9    Recent Labs Lab 05/17/16 0045  LIPASE 29   No results for input(s): AMMONIA in the last 168 hours. Coagulation Profile: No results for input(s): INR, PROTIME in the last 168 hours. Cardiac Enzymes: No results for input(s): CKTOTAL, CKMB, CKMBINDEX, TROPONINI in the last 168 hours. BNP (last 3 results) No results for input(s): PROBNP in the last 8760 hours. HbA1C: No results for input(s): HGBA1C in the last 72 hours. CBG: No results for input(s): GLUCAP in the last 168 hours. Lipid Profile: No results for input(s): CHOL, HDL, LDLCALC, TRIG, CHOLHDL, LDLDIRECT in the last 72 hours. Thyroid Function Tests: No results for input(s): TSH, T4TOTAL, FREET4, T3FREE, THYROIDAB in the last 72 hours. Anemia Panel: No results for input(s): VITAMINB12, FOLATE, FERRITIN, TIBC, IRON, RETICCTPCT in the last 72 hours. Sepsis Labs:  Recent Labs Lab 05/17/16 0408  LATICACIDVEN 1.00    Recent Results (from the past 240 hour(s))  Culture, blood (Routine X 2) w Reflex to ID Panel     Status: None (Preliminary result)   Collection Time: 05/17/16  4:00 AM  Result Value Ref Range Status   Specimen Description BLOOD RIGHT ARM  Final   Special Requests BOTTLES DRAWN AEROBIC AND ANAEROBIC 10ML  Final   Culture NO GROWTH 1 DAY  Final   Report Status PENDING  Incomplete  Culture, blood (Routine X 2) w Reflex to ID Panel     Status: None (Preliminary result)    Collection Time: 05/17/16  4:05 AM  Result Value Ref Range Status   Specimen Description BLOOD RIGHT FOREARM  Final   Special Requests BOTTLES DRAWN AEROBIC AND ANAEROBIC 5ML  Final   Culture NO GROWTH 1 DAY  Final   Report Status PENDING  Incomplete  Culture, sputum-assessment     Status: None   Collection Time: 05/17/16 10:13 PM  Result Value Ref Range Status   Specimen Description EXPECTORATED SPUTUM  Final   Special Requests NONE  Final   Sputum evaluation   Final    THIS SPECIMEN IS ACCEPTABLE. RESPIRATORY CULTURE REPORT TO FOLLOW.   Report Status 05/18/2016 FINAL  Final         Radiology Studies: Dg Chest 2 View  Result Date: 05/17/2016 CLINICAL DATA:  60 year old female with chest pain and shortness of breath EXAM: CHEST  2 VIEW COMPARISON:  Chest radiograph dated 07/1915 FINDINGS: Two views of the chest demonstrate emphysematous changes of the lungs with diffuse interstitial coarsening. Bibasilar linear atelectasis/ scarring noted. There is no focal consolidation, pleural effusion, or pneumothorax. The cardiac silhouette is within normal  limits. Left pectoral AICD device. No acute osseous pathology. IMPRESSION: Emphysema.  No focal consolidation or pneumothorax. Electronically Signed   By: Anner Crete M.D.   On: 05/17/2016 01:43   Ct Angio Chest Pe W Or Wo Contrast  Result Date: 05/17/2016 CLINICAL DATA:  Acute onset of shortness breath and generalized chest pain. Initial encounter. EXAM: CT ANGIOGRAPHY CHEST WITH CONTRAST TECHNIQUE: Multidetector CT imaging of the chest was performed using the standard protocol during bolus administration of intravenous contrast. Multiplanar CT image reconstructions and MIPs were obtained to evaluate the vascular anatomy. CONTRAST:  100 mL of Isovue 370 IV contrast COMPARISON:  CTA of the chest performed 03/05/2010, and chest radiograph performed earlier today at 12:48 a.m. FINDINGS: There is no evidence of pulmonary embolus. Mild patchy  opacity is noted at the right lung base, raising concern for a mild infectious process. Mild bilateral emphysematous change is noted, more prominent at the upper lung lobes. There is no evidence of pleural effusion or pneumothorax. No masses are identified; no abnormal focal contrast enhancement is seen. Scattered coronary artery calcifications are seen. Mild calcification is noted along the aortic arch. The great vessels are grossly unremarkable. No pericardial effusion is seen. No mediastinal lymphadenopathy is appreciated. A left-sided AICD is noted, with a single lead ending at the right ventricle. No axillary lymphadenopathy is seen. The visualized portions of the thyroid gland are unremarkable in appearance. The visualized portions of the liver and spleen are unremarkable. The patient is status cholecystectomy, with clips noted at the gallbladder fossa. No acute osseous abnormalities are seen. Review of the MIP images confirms the above findings. IMPRESSION: 1. No evidence of pulmonary embolus. 2. Mild patchy opacity at the right lung base, concerning for a mild infectious process. 3. Mild bilateral emphysematous change, more prominent at the upper lung lobes. 4. Scattered coronary artery calcifications seen. Electronically Signed   By: Garald Balding M.D.   On: 05/17/2016 03:26        Scheduled Meds: . aspirin EC  81 mg Oral Daily  . atorvastatin  10 mg Oral q1800  . azithromycin  500 mg Intravenous Q24H  . budesonide (PULMICORT) nebulizer solution  0.25 mg Nebulization BID  . carvedilol  12.5 mg Oral BID WC  . cefTRIAXone (ROCEPHIN)  IV  1 g Intravenous Q24H  . enoxaparin (LOVENOX) injection  40 mg Subcutaneous Q24H  . fluticasone  2 spray Each Nare Daily  . folic acid  1 mg Oral Daily  . guaiFENesin  1,200 mg Oral BID  . ipratropium-albuterol  3 mL Nebulization Q6H  . multivitamin with minerals  1 tablet Oral Daily  . spironolactone  25 mg Oral Daily  . thiamine  100 mg Oral Daily   Or   . thiamine  100 mg Intravenous Daily   Continuous Infusions:    LOS: 1 day    Time spent: 33 minutes    Abdulraheem Pineo, MD [facility] Pager [phone]  If 7PM-7AM, please contact night-coverage [email or web address] Password [email or web address] 05/18/2016, 1:17 PM

## 2016-05-19 DIAGNOSIS — J441 Chronic obstructive pulmonary disease with (acute) exacerbation: Principal | ICD-10-CM

## 2016-05-19 LAB — BASIC METABOLIC PANEL
Anion gap: 8 (ref 5–15)
CALCIUM: 9.5 mg/dL (ref 8.9–10.3)
CO2: 24 mmol/L (ref 22–32)
CREATININE: 0.55 mg/dL (ref 0.44–1.00)
Chloride: 105 mmol/L (ref 101–111)
GFR calc non Af Amer: >60 mL/min (ref >60)
Glucose, Bld: 91 mg/dL (ref 65–99)
Potassium: 3.9 mmol/L (ref 3.5–5.1)
SODIUM: 137 mmol/L (ref 135–145)

## 2016-05-19 LAB — CBC
HEMATOCRIT: 50.5 % — AB (ref 36.0–46.0)
Hemoglobin: 16.6 g/dL — ABNORMAL HIGH (ref 12.0–15.0)
MCH: 32.9 pg (ref 26.0–34.0)
MCHC: 32.9 g/dL (ref 30.0–36.0)
MCV: 100.2 fL — ABNORMAL HIGH (ref 78.0–100.0)
Platelets: 100 10*3/uL — ABNORMAL LOW (ref 150–400)
RBC: 5.04 MIL/uL (ref 3.87–5.11)
RDW: 13.3 % (ref 11.5–15.5)
WBC: 9.2 10*3/uL (ref 4.0–10.5)

## 2016-05-19 MED ORDER — LORATADINE 10 MG PO TABS
10.0000 mg | ORAL_TABLET | Freq: Every day | ORAL | Status: DC
  Administered 2016-05-19 – 2016-05-20 (×2): 10 mg via ORAL
  Filled 2016-05-19 (×2): qty 1

## 2016-05-19 MED ORDER — ARFORMOTEROL TARTRATE 15 MCG/2ML IN NEBU
15.0000 ug | INHALATION_SOLUTION | Freq: Two times a day (BID) | RESPIRATORY_TRACT | Status: DC
  Administered 2016-05-19 – 2016-05-20 (×3): 15 ug via RESPIRATORY_TRACT
  Filled 2016-05-19 (×3): qty 2

## 2016-05-19 MED ORDER — METHYLPREDNISOLONE SODIUM SUCC 125 MG IJ SOLR
60.0000 mg | Freq: Three times a day (TID) | INTRAMUSCULAR | Status: DC
  Administered 2016-05-19 – 2016-05-20 (×4): 60 mg via INTRAVENOUS
  Filled 2016-05-19 (×4): qty 2

## 2016-05-19 NOTE — Progress Notes (Signed)
PROGRESS NOTE    [name]  [id number] DOB: 1956/03/15 DOA: 05/17/2016 PCP: Vicenta Aly, FNP    Brief Narrative:  [name] is a 60 y.o. female with a multiple medical problems not limited to CAD s/p MI 2008, , [place], ischemic cardiomyopathy with chronic systolic heart failure status post ICD placement, hyperlipidemia, hypertension and COPD. Patient presented to the emergency department around midnight with right scapular pain, shortness of breath and cough. Cough started two days ago, productive of green phlegm. Scapular pain and shortness of breath woke her up yesterday mornring.  This morning she developed right sided chest pain like something was sitting on chest and worse with coughing. No fevers / chills. Pain totally resolved now. Mpw fever or chills.   Uses Nebulizers at home as needed which is seldom. Stopped smoking two weeks ago but had one cigarette Tuesday.      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 Patient with some clinical improvement. Sputum Gram stain and culture pending. Blood cultures pending. Patient currently afebrile. Continue empiric IV Rocephin and azithromycin, oxygen. Increased Mucinex to 1200 mg twice daily. Continue scheduled nebulizers, Pulmicort, Flonase. Brovana and IV Solu-Medrol added this morning secondary to probable mild acute COPD exacerbation. If continued improvement will transition to oral antibiotics tomorrow.   #2 mild acute COPD exacerbation Likely triggered by committee acquired pneumonia. Patient with wheezing and worsening shortness of breath this  morning on ambulation. Continue oxygen, scheduled nebs, Pulmicort, Flonase, Claritin, antibiotics. Add Brovana, IV Solu-Medrol 60 IV every 8 hours.   #3 hypertension Stable. Continue spironolactone and Coreg.  #4 coronary artery disease/peripheral vascular disease/status post ICD/chronic systolic heart failure Stable. Patient denies any chest pain. Continue aspirin, Lipitor, Coreg, spironolactone.  #5 alcohol use Stable. Patient with no signs of withdrawal. Continue folic acid, thiamine and Ativan withdrawal protocol.  #6 tobacco abuse Tobacco cessation.  #7 hyperlipidemia Continue statin.    DVT prophylaxis: Lovenox Code Status: Full Family Communication: Updated patient. No family at bedside. Disposition Plan: Home when medically stable hopefully in 1-2 days.   Consultants:   None  Procedures:   Chest x-ray 05/17/2016  CT angiogram chest 05/17/2016  Antimicrobials:   IV Azithromycin 05/17/2016  IV Rocephin 05/17/2016   Subjective: Patient noted to have wheezing and shortness of breath on ambulation early this morning which was improved with nebulizer treatments. Some improvement with cough.   Objective: Vitals:   05/18/16 1514 05/18/16 2037 05/18/16 2042 05/19/16 0528  BP: 115/74 133/75  (!) 167/97  Pulse: 75 64  73  Resp: 18 18  18   Temp: 98.1 F (36.7 C) 97.8 F (36.6 C)  97.7 F (36.5 C)  TempSrc: Oral Oral  Oral  SpO2: 93% 97% 97% 96%  Weight:    62.1 kg (136 lb 12.8 oz)  Height:        Intake/Output Summary (Last 24 hours) at 05/19/16 1151 Last data filed at 05/18/16 1800  Gross per 24 hour  Intake              300 ml  Output  0 ml  Net              300 ml   Filed Weights   05/17/16 0536 05/18/16 0524 05/19/16 0528  Weight: 60.7 kg (133 lb 14.4 oz) 61.1 kg (134 lb 11.2 oz) 62.1 kg (136 lb 12.8 oz)    Examination:  General exam: Appears calm and comfortable  Respiratory system: Minimal expiratory wheezing. Respiratory  effort normal. Cardiovascular system: S1 & S2 heard, RRR. No JVD, murmurs, rubs, gallops or clicks. No pedal edema. Gastrointestinal system: Abdomen is nondistended, soft and nontender. No organomegaly or masses felt. Normal bowel sounds heard. Central nervous system: Alert and oriented. No focal neurological deficits. Extremities: Symmetric 5 x 5 power. Skin: No rashes, lesions or ulcers Psychiatry: Judgement and insight appear normal. Mood & affect appropriate.     Data Reviewed: I have personally reviewed following labs and imaging studies  CBC:  Recent Labs Lab 05/17/16 0045 05/18/16 0403 05/19/16 0517  WBC 8.1 10.5 9.2  NEUTROABS 5.5  --   --   HGB 16.2* 15.4* 16.6*  HCT 48.1* 45.3 50.5*  MCV 96.0 97.6 100.2*  PLT 98* 94* 123XX123*   Basic Metabolic Panel:  Recent Labs Lab 05/17/16 0045 05/18/16 0403 05/19/16 0517  NA 132* 139 137  K 3.8 4.2 3.9  CL 98* 106 105  CO2 23 26 24   GLUCOSE 115* 96 91  BUN 5* 6 <5*  CREATININE 0.58 0.57 0.55  CALCIUM 9.5 9.5 9.5   GFR: Estimated Creatinine Clearance: 75.1 mL/min (by C-G formula based on SCr of 0.8 mg/dL). Liver Function Tests:  Recent Labs Lab 05/17/16 0045  AST 48*  ALT 26  ALKPHOS 84  BILITOT 0.7  PROT 7.6  ALBUMIN 3.9    Recent Labs Lab 05/17/16 0045  LIPASE 29   No results for input(s): AMMONIA in the last 168 hours. Coagulation Profile: No results for input(s): INR, PROTIME in the last 168 hours. Cardiac Enzymes: No results for input(s): CKTOTAL, CKMB, CKMBINDEX, TROPONINI in the last 168 hours. BNP (last 3 results) No results for input(s): PROBNP in the last 8760 hours. HbA1C: No results for input(s): HGBA1C in the last 72 hours. CBG: No results for input(s): GLUCAP in the last 168 hours. Lipid Profile: No results for input(s): CHOL, HDL, LDLCALC, TRIG, CHOLHDL, LDLDIRECT in the last 72 hours. Thyroid Function Tests: No results for input(s): TSH, T4TOTAL, FREET4, T3FREE, THYROIDAB in the last  72 hours. Anemia Panel: No results for input(s): VITAMINB12, FOLATE, FERRITIN, TIBC, IRON, RETICCTPCT in the last 72 hours. Sepsis Labs:  Recent Labs Lab 05/17/16 0408  LATICACIDVEN 1.00    Recent Results (from the past 240 hour(s))  Culture, blood (Routine X 2) w Reflex to ID Panel     Status: None (Preliminary result)   Collection Time: 05/17/16  4:00 AM  Result Value Ref Range Status   Specimen Description BLOOD RIGHT ARM  Final   Special Requests BOTTLES DRAWN AEROBIC AND ANAEROBIC 10ML  Final   Culture NO GROWTH 1 DAY  Final   Report Status PENDING  Incomplete  Culture, blood (Routine X 2) w Reflex to ID Panel     Status: None (Preliminary result)   Collection Time: 05/17/16  4:05 AM  Result Value Ref Range Status   Specimen Description BLOOD RIGHT FOREARM  Final   Special Requests BOTTLES DRAWN AEROBIC AND ANAEROBIC 5ML  Final   Culture NO GROWTH 1 DAY  Final   Report Status PENDING  Incomplete  Culture,  sputum-assessment     Status: None   Collection Time: 05/17/16 10:13 PM  Result Value Ref Range Status   Specimen Description EXPECTORATED SPUTUM  Final   Special Requests NONE  Final   Sputum evaluation   Final    THIS SPECIMEN IS ACCEPTABLE. RESPIRATORY CULTURE REPORT TO FOLLOW.   Report Status 05/18/2016 FINAL  Final  Culture, respiratory (NON-Expectorated)     Status: None (Preliminary result)   Collection Time: 05/17/16 10:13 PM  Result Value Ref Range Status   Specimen Description EXPECTORATED SPUTUM  Final   Special Requests NONE  Final   Gram Stain   Final    MODERATE WBC PRESENT, PREDOMINANTLY PMN RARE SQUAMOUS EPITHELIAL CELLS PRESENT MODERATE GRAM POSITIVE COCCI IN PAIRS IN CLUSTERS MODERATE GRAM VARIABLE ROD MODERATE GRAM NEGATIVE COCCOBACILLI    Culture CULTURE REINCUBATED FOR BETTER GROWTH  Final   Report Status PENDING  Incomplete         Radiology Studies: No results found.      Scheduled Meds: . arformoterol  15 mcg Nebulization BID   . aspirin EC  81 mg Oral Daily  . atorvastatin  10 mg Oral q1800  . azithromycin  500 mg Intravenous Q24H  . budesonide (PULMICORT) nebulizer solution  0.25 mg Nebulization BID  . carvedilol  12.5 mg Oral BID WC  . cefTRIAXone (ROCEPHIN)  IV  1 g Intravenous Q24H  . enoxaparin (LOVENOX) injection  40 mg Subcutaneous Q24H  . fluticasone  2 spray Each Nare Daily  . folic acid  1 mg Oral Daily  . guaiFENesin  1,200 mg Oral BID  . ipratropium-albuterol  3 mL Nebulization TID  . loratadine  10 mg Oral Daily  . methylPREDNISolone (SOLU-MEDROL) injection  60 mg Intravenous Q8H  . multivitamin with minerals  1 tablet Oral Daily  . spironolactone  25 mg Oral Daily  . thiamine  100 mg Oral Daily   Or  . thiamine  100 mg Intravenous Daily   Continuous Infusions:    LOS: 2 days    Time spent: 4 minutes    Alarik Radu, MD [facility] Pager [phone]  If 7PM-7AM, please contact night-coverage [email or web address] Password [email or web address] 05/19/2016, 11:51 AM

## 2016-05-20 DIAGNOSIS — J441 Chronic obstructive pulmonary disease with (acute) exacerbation: Secondary | ICD-10-CM

## 2016-05-20 LAB — BASIC METABOLIC PANEL
ANION GAP: 8 (ref 5–15)
BUN: <5 mg/dL — ABNORMAL LOW (ref 6–20)
CALCIUM: 9.5 mg/dL (ref 8.9–10.3)
CO2: 26 mmol/L (ref 22–32)
Chloride: 106 mmol/L (ref 101–111)
Creatinine, Ser: 0.57 mg/dL (ref 0.44–1.00)
GFR calc non Af Amer: >60 mL/min (ref >60)
Glucose, Bld: 128 mg/dL — ABNORMAL HIGH (ref 65–99)
POTASSIUM: 3.7 mmol/L (ref 3.5–5.1)
SODIUM: 140 mmol/L (ref 135–145)

## 2016-05-20 LAB — CBC
HCT: 43.7 % (ref 36.0–46.0)
Hemoglobin: 14.6 g/dL (ref 12.0–15.0)
MCH: 32.6 pg (ref 26.0–34.0)
MCHC: 33.4 g/dL (ref 30.0–36.0)
MCV: 97.5 fL (ref 78.0–100.0)
PLATELETS: 118 10*3/uL — AB (ref 150–400)
RBC: 4.48 MIL/uL (ref 3.87–5.11)
RDW: 12.9 % (ref 11.5–15.5)
WBC: 6.6 10*3/uL (ref 4.0–10.5)

## 2016-05-20 LAB — CULTURE, RESPIRATORY: CULTURE: NORMAL

## 2016-05-20 MED ORDER — ADULT MULTIVITAMIN W/MINERALS CH: 1.0000 | ORAL_TABLET | Freq: Every day | ORAL | Status: DC

## 2016-05-20 MED ORDER — PREDNISONE 20 MG PO TABS: 20.0000 mg | ORAL_TABLET | Freq: Every day | ORAL | 0 refills | Status: DC

## 2016-05-20 MED ORDER — FLUTICASONE PROPIONATE 50 MCG/ACT NA SUSP: 2.0000 | Freq: Every day | NASAL | 2 refills | Status: DC

## 2016-05-20 MED ORDER — BUDESONIDE-FORMOTEROL FUMARATE 160-4.5 MCG/ACT IN AERO: 2.0000 | INHALATION_SPRAY | Freq: Two times a day (BID) | RESPIRATORY_TRACT | 6 refills | Status: DC

## 2016-05-20 MED ORDER — TIOTROPIUM BROMIDE MONOHYDRATE 18 MCG IN CAPS: 18.0000 ug | ORAL_CAPSULE | Freq: Every day | RESPIRATORY_TRACT | 6 refills | Status: DC

## 2016-05-20 MED ORDER — PROAIR HFA 108 (90 BASE) MCG/ACT IN AERS
2.0000 | INHALATION_SPRAY | Freq: Four times a day (QID) | RESPIRATORY_TRACT | 3 refills | Status: DC | PRN
Start: 2016-05-20 — End: 2017-11-03

## 2016-05-20 MED ORDER — AMOXICILLIN-POT CLAVULANATE 875-125 MG PO TABS: 1.0000 | ORAL_TABLET | Freq: Two times a day (BID) | ORAL | 0 refills | Status: AC

## 2016-05-20 MED ORDER — FOLIC ACID 1 MG PO TABS: 1.0000 mg | ORAL_TABLET | Freq: Every day | ORAL | Status: DC

## 2016-05-20 MED ORDER — THIAMINE HCL 100 MG PO TABS: 100.0000 mg | ORAL_TABLET | Freq: Every day | ORAL | Status: DC

## 2016-05-20 MED ORDER — PREDNISONE 20 MG PO TABS: 60.0000 mg | ORAL_TABLET | Freq: Two times a day (BID) | ORAL | Status: DC

## 2016-05-20 MED ORDER — GUAIFENESIN ER 600 MG PO TB12: 1200.0000 mg | ORAL_TABLET | Freq: Two times a day (BID) | ORAL | 0 refills | Status: AC

## 2016-05-20 NOTE — Care Management Important Message (Signed)
Important Message  Patient Details  Name: [name] MRN: [id number] Date of Birth: 03/04/1956   Medicare Important Message Given:  Yes    [name] [name] 05/20/2016, 1:48 PM

## 2016-05-20 NOTE — Discharge Summary (Signed)
Physician Discharge Summary  [name] [id number] DOB: 1955-11-19 DOA: 05/17/2016  PCP: [name], [name]  Admit date: 05/17/2016 Discharge date: 05/20/2016  Time spent: 65 minutes  Recommendations for Outpatient Follow-up:  1. Follow-up with [name] in 2 weeks. On follow-up patient will need a basic metabolic profile done to follow-up on electrolytes and renal function. Patient's COPD we'll need to be reassessed at that time. Patient may benefit from outpatient referral to pulmonologist. 2.    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 Trish Mage [name] is a 60 y.o. female with a multiple medical problems not limited to CAD s/p MI 2008, , [place], ischemic cardiomyopathy with chronic systolic heart failure status post ICD placement, hyperlipidemia, hypertension and COPD. Patient presented to the emergency department around midnight with right scapular pain, shortness of breath and cough. Cough started two days prior to admission, productive of green phlegm. Scapular pain and shortness of breath woke her up the morning prior to admission. On the morning of admission, she developed right sided chest pain like something was sitting on chest and worse with coughing. No fevers / chills. Pain totally resolved now. No fever or chills.   Uses Nebulizers at home as needed which is seldom. Stopped  smoking two weeks ago but had one cigarette Tuesday.     Hospital Course:  #1 community acquired pneumonia/RLL Per CT chest. Patient was admitted, sputum Gram stain and culture obtained as well as blood cultures which came back negative. Patient was placed on oxygen, empiric IV Rocephin and azithromycin, Mucinex, scheduled nebulizers. Patient improved clinically on a daily basis. Patient remained afebrile. Patient will be discharged home on 5 more days of oral Augmentin to complete a course of antibiotic treatment.   #2 mild acute COPD exacerbation Likely triggered by community acquired pneumonia. Patient with wheezing and worsening shortness of breath the morning of 05/19/2016 on ambulation. Patient was subsequently maintained on Pulmicort, Brovana, Flonase, Claritin, scheduled nebulizers, IV steroids and oxygen. Patient improved clinically and transition to oral prednisone and will be discharged home on a prednisone taper. Outpatient follow-up.   #3 hypertension Stable. Continue spironolactone and Coreg.  #4 coronary artery disease/peripheral vascular disease/status post ICD/chronic systolic heart failure Stable. Patient denied any chest pain. Continued on home regimen of aspirin, Lipitor, Coreg, spironolactone.  #5 alcohol use Stable. Patient with no signs of withdrawal. Patient was maintained on folic acid, thiamine and Ativan withdrawal protocol.  #6 tobacco abuse Tobacco cessation.  #7 hyperlipidemia Continued on statin.   Procedures:  Chest x-ray 05/17/2016  CT angiogram chest 05/17/2016   Consultations:  None  Discharge Exam: Vitals:   05/20/16 0631 05/20/16 1247  BP: (!) 146/75 136/83  Pulse: 68 78  Resp: 16 18  Temp: 97.8 F (36.6 C) 98 F (36.7 C)    General: NAD Cardiovascular: RRR Respiratory: CTAB  Discharge Instructions   Discharge Instructions  Diet general    Complete by:  As directed   Discharge instructions    Complete by:  As  directed   Stop smoking.   Increase activity slowly    Complete by:  As directed     Current Discharge Medication List    START taking these medications   Details  amoxicillin-clavulanate (AUGMENTIN) 875-125 MG tablet Take 1 tablet by mouth 2 (two) times daily. Take for 5 days then stop. Qty: 10 tablet, Refills: 0    fluticasone (FLONASE) 50 MCG/ACT nasal spray Place 2 sprays into both nostrils daily. Refills: 2    folic acid (FOLVITE) 1 MG tablet Take 1 tablet (1 mg total) by mouth daily.    Multiple Vitamin (MULTIVITAMIN WITH MINERALS) TABS tablet Take 1 tablet by mouth daily.    predniSONE (DELTASONE) 20 MG tablet Take 1-3 tablets (20-60 mg total) by mouth daily with breakfast. Take 3 tablets (60mg ) daily x 2 days, then 2 tablets (40mg ) daily x 3 days, then 1 tablet (20mg ) daily x 3 days then stop. Qty: 15 tablet, Refills: 0    thiamine 100 MG tablet Take 1 tablet (100 mg total) by mouth daily.    tiotropium (SPIRIVA HANDIHALER) 18 MCG inhalation capsule Place 1 capsule (18 mcg total) into inhaler and inhale daily. Qty: 30 capsule, Refills: 6      CONTINUE these medications which have CHANGED   Details  budesonide-formoterol (SYMBICORT) 160-4.5 MCG/ACT inhaler Inhale 2 puffs into the lungs 2 (two) times daily. Qty: 1 Inhaler, Refills: 6    guaiFENesin (MUCINEX) 600 MG 12 hr tablet Take 2 tablets (1,200 mg total) by mouth 2 (two) times daily. Take for 5 days then stop. Qty: 20 tablet, Refills: 0    PROAIR HFA 108 (90 Base) MCG/ACT inhaler Inhale 2 puffs into the lungs every 6 (six) hours as needed. 2 puffs 3 times daily x 5 days then every 6 hours as needed. Shortness of breath or wheezing Qty: 18 g, Refills: 3      CONTINUE these medications which have NOT CHANGED   Details  acetaminophen (TYLENOL) 500 MG tablet Take 500 mg by mouth every 6 (six) hours as needed for headache.     aspirin 81 MG tablet Take 81 mg by mouth daily.    atorvastatin (LIPITOR) 10 MG tablet  Take 1 tablet (10 mg total) by mouth daily at 6 PM. Qty: 30 tablet, Refills: 11    carvedilol (COREG) 12.5 MG tablet TAKE 1 TABLET (12.5 MG TOTAL) BY MOUTH 2 (TWO) TIMES DAILY WITH A MEAL. Qty: 180 tablet, Refills: 3    ENTRESTO 97-103 MG TAKE 1 TABLET BY MOUTH 2 (TWO) TIMES DAILY. Qty: 60 tablet, Refills: 1   Associated Diagnoses: Chronic systolic heart failure (HCC)    nitroGLYCERIN (NITROSTAT) 0.4 MG SL tablet Place 1 tablet (0.4 mg total) under the tongue every 5 (five) minutes as needed for chest pain (up to 3 doses). Qty: 25 tablet, Refills: 4    spironolactone (ALDACTONE) 25 MG tablet Take 25 mg by mouth daily.    feeding supplement, ENSURE COMPLETE, (ENSURE COMPLETE) LIQD Take 237 mLs by mouth daily.       Allergies  Allergen Reactions  . Clopidogrel Bisulfate Rash   Follow-up Information    [name]. Schedule an appointment as soon as possible for a visit in 2 week(s).   Specialty:  Nurse Practitioner Contact information: [place] [address] [phone]  The results of significant diagnostics from this hospitalization (including imaging, microbiology, ancillary and laboratory) are listed below for reference.    Significant Diagnostic Studies: Dg Chest 2 View  Result Date: 05/17/2016 CLINICAL DATA:  60 year old female with chest pain and shortness of breath EXAM: CHEST  2 VIEW COMPARISON:  Chest radiograph dated 07/1915 FINDINGS: Two views of the chest demonstrate emphysematous changes of the lungs with diffuse interstitial coarsening. Bibasilar linear atelectasis/ scarring noted. There is no focal consolidation, pleural effusion, or pneumothorax. The cardiac silhouette is within normal limits. Left pectoral AICD device. No acute osseous pathology. IMPRESSION: Emphysema.  No focal consolidation or pneumothorax. Electronically Signed   By: Anner Crete M.D.   On: 05/17/2016 01:43   Ct Angio Chest Pe W Or Wo  Contrast  Result Date: 05/17/2016 CLINICAL DATA:  Acute onset of shortness breath and generalized chest pain. Initial encounter. EXAM: CT ANGIOGRAPHY CHEST WITH CONTRAST TECHNIQUE: Multidetector CT imaging of the chest was performed using the standard protocol during bolus administration of intravenous contrast. Multiplanar CT image reconstructions and MIPs were obtained to evaluate the vascular anatomy. CONTRAST:  100 mL of Isovue 370 IV contrast COMPARISON:  CTA of the chest performed 03/05/2010, and chest radiograph performed earlier today at 12:48 a.m. FINDINGS: There is no evidence of pulmonary embolus. Mild patchy opacity is noted at the right lung base, raising concern for a mild infectious process. Mild bilateral emphysematous change is noted, more prominent at the upper lung lobes. There is no evidence of pleural effusion or pneumothorax. No masses are identified; no abnormal focal contrast enhancement is seen. Scattered coronary artery calcifications are seen. Mild calcification is noted along the aortic arch. The great vessels are grossly unremarkable. No pericardial effusion is seen. No mediastinal lymphadenopathy is appreciated. A left-sided AICD is noted, with a single lead ending at the right ventricle. No axillary lymphadenopathy is seen. The visualized portions of the thyroid gland are unremarkable in appearance. The visualized portions of the liver and spleen are unremarkable. The patient is status cholecystectomy, with clips noted at the gallbladder fossa. No acute osseous abnormalities are seen. Review of the MIP images confirms the above findings. IMPRESSION: 1. No evidence of pulmonary embolus. 2. Mild patchy opacity at the right lung base, concerning for a mild infectious process. 3. Mild bilateral emphysematous change, more prominent at the upper lung lobes. 4. Scattered coronary artery calcifications seen. Electronically Signed   By: Garald Balding M.D.   On: 05/17/2016 03:26     Microbiology: Recent Results (from the past 240 hour(s))  Culture, blood (Routine X 2) w Reflex to ID Panel     Status: None (Preliminary result)   Collection Time: 05/17/16  4:00 AM  Result Value Ref Range Status   Specimen Description BLOOD RIGHT ARM  Final   Special Requests BOTTLES DRAWN AEROBIC AND ANAEROBIC 10ML  Final   Culture NO GROWTH 2 DAYS  Final   Report Status PENDING  Incomplete  Culture, blood (Routine X 2) w Reflex to ID Panel     Status: None (Preliminary result)   Collection Time: 05/17/16  4:05 AM  Result Value Ref Range Status   Specimen Description BLOOD RIGHT FOREARM  Final   Special Requests BOTTLES DRAWN AEROBIC AND ANAEROBIC 5ML  Final   Culture NO GROWTH 2 DAYS  Final   Report Status PENDING  Incomplete  Culture, sputum-assessment     Status: None   Collection Time: 05/17/16 10:13 PM  Result Value Ref Range Status  Specimen Description EXPECTORATED SPUTUM  Final   Special Requests NONE  Final   Sputum evaluation   Final    THIS SPECIMEN IS ACCEPTABLE. RESPIRATORY CULTURE REPORT TO FOLLOW.   Report Status 05/18/2016 FINAL  Final  Culture, respiratory (NON-Expectorated)     Status: None   Collection Time: 05/17/16 10:13 PM  Result Value Ref Range Status   Specimen Description EXPECTORATED SPUTUM  Final   Special Requests NONE  Final   Gram Stain   Final    MODERATE WBC PRESENT, PREDOMINANTLY PMN RARE SQUAMOUS EPITHELIAL CELLS PRESENT MODERATE GRAM POSITIVE COCCI IN PAIRS IN CLUSTERS MODERATE GRAM VARIABLE ROD MODERATE GRAM NEGATIVE COCCOBACILLI    Culture Consistent with normal respiratory flora.  Final   Report Status 05/20/2016 FINAL  Final     Labs: Basic Metabolic Panel:  Recent Labs Lab 05/17/16 0045 05/18/16 0403 05/19/16 0517 05/20/16 0301  NA 132* 139 137 140  K 3.8 4.2 3.9 3.7  CL 98* 106 105 106  CO2 23 26 24 26   GLUCOSE 115* 96 91 128*  BUN 5* 6 <5* <5*  CREATININE 0.58 0.57 0.55 0.57  CALCIUM 9.5 9.5 9.5 9.5   Liver  Function Tests:  Recent Labs Lab 05/17/16 0045  AST 48*  ALT 26  ALKPHOS 84  BILITOT 0.7  PROT 7.6  ALBUMIN 3.9    Recent Labs Lab 05/17/16 0045  LIPASE 29   No results for input(s): AMMONIA in the last 168 hours. CBC:  Recent Labs Lab 05/17/16 0045 05/18/16 0403 05/19/16 0517 05/20/16 0301  WBC 8.1 10.5 9.2 6.6  NEUTROABS 5.5  --   --   --   HGB 16.2* 15.4* 16.6* 14.6  HCT 48.1* 45.3 50.5* 43.7  MCV 96.0 97.6 100.2* 97.5  PLT 98* 94* 100* 118*   Cardiac Enzymes: No results for input(s): CKTOTAL, CKMB, CKMBINDEX, TROPONINI in the last 168 hours. BNP: BNP (last 3 results)  Recent Labs  05/17/16 0045  BNP 54.1    ProBNP (last 3 results) No results for input(s): PROBNP in the last 8760 hours.  CBG: No results for input(s): GLUCAP in the last 168 hours.     SignedIrine Seal MD.  [facility] 05/20/2016, 2:08 PM

## 2016-05-22 LAB — CULTURE, BLOOD (ROUTINE X 2)
CULTURE: NO GROWTH
Culture: NO GROWTH

## 2016-05-30 ENCOUNTER — Ambulatory Visit (INDEPENDENT_AMBULATORY_CARE_PROVIDER_SITE_OTHER): Payer: Medicare Other | Admitting: *Deleted

## 2016-05-30 DIAGNOSIS — I420 Dilated cardiomyopathy: Secondary | ICD-10-CM | POA: Diagnosis not present

## 2016-05-30 NOTE — Progress Notes (Signed)
Remote ICD transmission.   

## 2016-05-31 ENCOUNTER — Encounter: Payer: Self-pay | Admitting: Cardiology

## 2016-06-20 LAB — CUP PACEART REMOTE DEVICE CHECK
Battery Voltage: 3.01 V
Brady Statistic RV Percent Paced: 0.05 %
HighPow Impedance: 70 Ohm
Implantable Lead Location: 753860
Implantable Lead Model: 6935
Lead Channel Impedance Value: 437 Ohm
Lead Channel Impedance Value: 513 Ohm
Lead Channel Pacing Threshold Amplitude: 0.5 V
Lead Channel Sensing Intrinsic Amplitude: 10 mV
Lead Channel Setting Pacing Amplitude: 2.5 V
MDC IDC LEAD IMPLANT DT: 20140212
MDC IDC MSMT BATTERY REMAINING LONGEVITY: 109 mo
MDC IDC MSMT LEADCHNL RV PACING THRESHOLD PULSEWIDTH: 0.4 ms
MDC IDC MSMT LEADCHNL RV SENSING INTR AMPL: 10 mV
MDC IDC SESS DTM: 20170921121027
MDC IDC SET LEADCHNL RV PACING PULSEWIDTH: 0.4 ms
MDC IDC SET LEADCHNL RV SENSING SENSITIVITY: 0.3 mV

## 2016-06-24 NOTE — Progress Notes (Signed)
Patient referred to [facility] clinic by Memory [facility], device tech/Dr Lovena Le.  Call to patient and provided ICM intro and explained program.  She agreed to monthly calls.  She was in the hospital in September for pneumonia.  She is currently at her sister's house and will return next week.  1st ICM remote transmission scheduled for 07/03/2016.  Will provided ICM number at next call.

## 2016-07-03 ENCOUNTER — Telehealth: Payer: Self-pay | Admitting: Cardiology

## 2016-07-03 ENCOUNTER — Ambulatory Visit (INDEPENDENT_AMBULATORY_CARE_PROVIDER_SITE_OTHER): Payer: Medicare Other

## 2016-07-03 DIAGNOSIS — Z95810 Presence of automatic (implantable) cardiac defibrillator: Secondary | ICD-10-CM | POA: Diagnosis not present

## 2016-07-03 DIAGNOSIS — I420 Dilated cardiomyopathy: Secondary | ICD-10-CM

## 2016-07-03 NOTE — Telephone Encounter (Signed)
Spoke with pt and reminded pt of remote transmission that is due today. Pt verbalized understanding.   

## 2016-07-04 NOTE — Progress Notes (Signed)
EPIC Encounter for ICM Monitoring  Patient Name: [name] is a 60 y.o. female Date: 07/04/2016 Primary Care Physican: Vicenta Aly, [place] Primary Cardiologist: Johnsie Cancel Electrophysiologist: Lovena Le Dry Weight: unknown  1st ICM encounter.  Heart Failure questions reviewed, pt asymptomatic   Thoracic impedance abnormal suggesting fluid accumulation.  She stated she was out of town and that is why she may be showing fluid retention.   Taking Spironolactone and no other diuretics prescribed.  Recommendations:  No changes.  Discussed low sodium diet and advised to limit salt intake to 2000 mg daily.  She uses Ms Deliah Boston to cook with. Encouraged to call for fluid symptoms.    Follow-up plan: ICM clinic phone appointment on 08/08/2016.  Copy of ICM check sent to device physician.   ICM trend: 07/03/2016       Karen Billings, RN 07/04/2016 2:23 PM

## 2016-07-25 ENCOUNTER — Other Ambulatory Visit (HOSPITAL_COMMUNITY): Payer: Self-pay | Admitting: Internal Medicine

## 2016-07-25 DIAGNOSIS — I5022 Chronic systolic (congestive) heart failure: Secondary | ICD-10-CM

## 2016-08-08 ENCOUNTER — Ambulatory Visit (INDEPENDENT_AMBULATORY_CARE_PROVIDER_SITE_OTHER): Payer: Medicare Other

## 2016-08-08 DIAGNOSIS — Z95810 Presence of automatic (implantable) cardiac defibrillator: Secondary | ICD-10-CM | POA: Diagnosis not present

## 2016-08-08 DIAGNOSIS — I5022 Chronic systolic (congestive) heart failure: Secondary | ICD-10-CM

## 2016-08-08 NOTE — Progress Notes (Signed)
EPIC Encounter for ICM Monitoring  Patient Name: [name] is a 60 y.o. female Date: 08/08/2016 Primary Care Physican: Vicenta Aly, [place] Primary Cardiologist: Johnsie Cancel Electrophysiologist: Lovena Le Dry Weight: 140 lbs         Heart Failure questions reviewed, pt symptomatic with weight gain of 3 lbs but does not weigh routinely  Thoracic impedance abnormal suggesting fluid accumulation since October.  Recommendations:  She reported she drinks a lot of fluid and explained should limit to 2 liters per day and also reinforced low salt food choices. Encouraged to call for fluid symptoms.  Taking Spironolactone and no other diuretics prescribed.  Follow-up plan: ICM clinic phone appointment on 08/12/2016 to recheck fluid levels.  Copy of ICM check sent to primary cardiologist and device physician.   ICM trend: 08/08/2016       [name], RN 08/08/2016 5:18 PM

## 2016-08-12 ENCOUNTER — Ambulatory Visit (INDEPENDENT_AMBULATORY_CARE_PROVIDER_SITE_OTHER): Payer: Medicare Other

## 2016-08-12 DIAGNOSIS — I5022 Chronic systolic (congestive) heart failure: Secondary | ICD-10-CM

## 2016-08-12 DIAGNOSIS — Z95810 Presence of automatic (implantable) cardiac defibrillator: Secondary | ICD-10-CM

## 2016-08-12 NOTE — Progress Notes (Signed)
EPIC Encounter for ICM Monitoring  Patient Name: [name] is a 60 y.o. female Date: 08/12/2016 Primary Care Physican: Vicenta Aly, [place] Primary Cardiologist: Johnsie Cancel Electrophysiologist: Lovena Le Dry Weight:    140 lbs        Heart Failure questions reviewed, pt asymptomatic   Thoracic impedance returned to normal   Recommendations: No changes.  Reinforced low salt food choices and limiting fluid intake to < 2 liters per day. Encouraged to call for fluid symptoms.    Follow-up plan: ICM clinic phone appointment on 09/13/2015.  Copy of ICM check sent to device physician.   ICM trend: 08/12/2016     [name], RN 08/12/2016 1:27 PM

## 2016-09-12 ENCOUNTER — Telehealth: Payer: Self-pay

## 2016-09-12 ENCOUNTER — Ambulatory Visit (INDEPENDENT_AMBULATORY_CARE_PROVIDER_SITE_OTHER): Payer: Medicare Other | Admitting: *Deleted

## 2016-09-12 DIAGNOSIS — I5022 Chronic systolic (congestive) heart failure: Secondary | ICD-10-CM

## 2016-09-12 DIAGNOSIS — Z95810 Presence of automatic (implantable) cardiac defibrillator: Secondary | ICD-10-CM | POA: Diagnosis not present

## 2016-09-12 DIAGNOSIS — I420 Dilated cardiomyopathy: Secondary | ICD-10-CM

## 2016-09-12 NOTE — Telephone Encounter (Signed)
Remote ICM transmission received.  Attempted patient call and left message to return call.   

## 2016-09-12 NOTE — Progress Notes (Signed)
EPIC Encounter for ICM Monitoring  Patient Name: [name] is a 61 y.o. female Date: 09/12/2016 Primary Care Physican: Vicenta Aly, [place] Primary [place] Electrophysiologist: Druscilla Brownie Weight:[place]       Attempted ICM call and unable to reach.  Left message to return call regarding transmission.  Transmission reviewed.   Thoracic impedance just starting to trend below baseline on 09/11/2016 suggesting fluid accumulation.   Impedance has been mostly below baseline during month of November and December.  Recommendations: NONE - unable to reach  Follow-up plan: ICM clinic phone appointment on 10/15/2016.  Copy of ICM check sent to primary cardiologist and device physician.   3 month ICM trend : 09/12/2016   1 Year ICM trend:      [name], RN 09/12/2016 2:52 PM

## 2016-09-12 NOTE — Progress Notes (Signed)
Remote ICD transmission.   

## 2016-09-13 ENCOUNTER — Encounter: Payer: Self-pay | Admitting: Cardiology

## 2016-09-16 NOTE — Telephone Encounter (Signed)
Attempted ICM return call and no answer.  Left message to return call.

## 2016-09-16 NOTE — Telephone Encounter (Signed)
Patient called and left voice message.  Attempted to return call and no answer.  Left voice mail message that I called and would try back.

## 2016-10-01 LAB — CUP PACEART REMOTE DEVICE CHECK
Battery Remaining Longevity: 106 mo
Battery Voltage: 3.01 V
HIGH POWER IMPEDANCE MEASURED VALUE: 69 Ohm
Implantable Lead Implant Date: 20140212
Implantable Lead Model: 6935
Implantable Pulse Generator Implant Date: 20140212
Lead Channel Impedance Value: 437 Ohm
Lead Channel Pacing Threshold Amplitude: 0.5 V
Lead Channel Pacing Threshold Pulse Width: 0.4 ms
Lead Channel Setting Pacing Pulse Width: 0.4 ms
Lead Channel Setting Sensing Sensitivity: 0.3 mV
MDC IDC LEAD LOCATION: 753860
MDC IDC MSMT LEADCHNL RV IMPEDANCE VALUE: 494 Ohm
MDC IDC MSMT LEADCHNL RV SENSING INTR AMPL: 8.75 mV
MDC IDC MSMT LEADCHNL RV SENSING INTR AMPL: 8.75 mV
MDC IDC SESS DTM: 20180104152101
MDC IDC SET LEADCHNL RV PACING AMPLITUDE: 2.5 V
MDC IDC STAT BRADY RV PERCENT PACED: 0.02 %

## 2016-10-15 ENCOUNTER — Ambulatory Visit (INDEPENDENT_AMBULATORY_CARE_PROVIDER_SITE_OTHER): Payer: Medicare Other

## 2016-10-15 DIAGNOSIS — I5022 Chronic systolic (congestive) heart failure: Secondary | ICD-10-CM

## 2016-10-15 DIAGNOSIS — Z95810 Presence of automatic (implantable) cardiac defibrillator: Secondary | ICD-10-CM

## 2016-10-15 NOTE — Progress Notes (Signed)
No ICM remote transmission received for 10/15/2016 and next ICM transmission scheduled for 10/29/2016.

## 2016-10-29 ENCOUNTER — Telehealth: Payer: Self-pay | Admitting: Cardiology

## 2016-10-29 NOTE — Telephone Encounter (Signed)
LMOVM reminding pt to send remote transmission.   

## 2016-11-11 NOTE — Progress Notes (Signed)
No ICM remote transmission received for 10/29/2016 and next ICM transmission scheduled for 11/25/2016.

## 2016-11-25 ENCOUNTER — Telehealth: Payer: Self-pay | Admitting: Cardiology

## 2016-11-25 NOTE — Telephone Encounter (Signed)
LMOVM reminding pt to send remote transmission.   

## 2016-11-26 NOTE — Progress Notes (Signed)
No ICM remote transmission received for 11/25/2016 and next ICM transmission scheduled for 01/16/2017.  Defib check office appointment with Dr Lovena Le 12/16/2016

## 2016-12-04 ENCOUNTER — Encounter: Payer: Self-pay | Admitting: Internal Medicine

## 2016-12-12 ENCOUNTER — Encounter: Payer: Medicare Other | Admitting: Internal Medicine

## 2016-12-16 ENCOUNTER — Ambulatory Visit (INDEPENDENT_AMBULATORY_CARE_PROVIDER_SITE_OTHER): Payer: Medicare Other | Admitting: Internal Medicine

## 2016-12-16 ENCOUNTER — Encounter: Payer: Self-pay | Admitting: Internal Medicine

## 2016-12-16 VITALS — BP 158/96 | HR 96 | Ht 69.5 in | Wt 178.8 lb

## 2016-12-16 DIAGNOSIS — I5022 Chronic systolic (congestive) heart failure: Secondary | ICD-10-CM | POA: Diagnosis not present

## 2016-12-16 DIAGNOSIS — I472 Ventricular tachycardia: Secondary | ICD-10-CM | POA: Diagnosis not present

## 2016-12-16 DIAGNOSIS — I4729 Other ventricular tachycardia: Secondary | ICD-10-CM

## 2016-12-16 NOTE — Progress Notes (Signed)
HPI [name] returns today for followup. She is a pleasant 61 yo woman with a h/o CAD, VT, chronic systolic heart failure, s/p ICD implant. She has done well in the interim. She denies chest pain, sob, or syncope. No ICD shock. She has been reducing her tobacco use and is now down to 3 cigarettes a day. She was smoking only one but had 2 deaths in her family.  Allergies  Allergen Reactions  . Clopidogrel Bisulfate Rash     Current Outpatient Prescriptions  Medication Sig Dispense Refill  . acetaminophen (TYLENOL) 500 MG tablet Take 500 mg by mouth every 6 (six) hours as needed for headache.     Marland Kitchen aspirin 81 MG tablet Take 81 mg by mouth daily.    Marland Kitchen atorvastatin (LIPITOR) 10 MG tablet Take 1 tablet (10 mg total) by mouth daily at 6 PM. 30 tablet 11  . budesonide-formoterol (SYMBICORT) 160-4.5 MCG/ACT inhaler Inhale 2 puffs into the lungs 2 (two) times daily. 1 Inhaler 6  . carvedilol (COREG) 12.5 MG tablet TAKE 1 TABLET (12.5 MG TOTAL) BY MOUTH 2 (TWO) TIMES DAILY WITH A MEAL. 180 tablet 3  . ENTRESTO 97-103 MG TAKE 1 TABLET BY MOUTH 2 (TWO) TIMES DAILY. 60 tablet 1  . feeding supplement, ENSURE COMPLETE, (ENSURE COMPLETE) LIQD Take 237 mLs by mouth daily.    . fluticasone (FLONASE) 50 MCG/ACT nasal spray Place 2 sprays into both nostrils daily.  2  . folic acid (FOLVITE) 1 MG tablet Take 1 tablet (1 mg total) by mouth daily.    . Multiple Vitamin (MULTIVITAMIN WITH MINERALS) TABS tablet Take 1 tablet by mouth daily.    . nitroGLYCERIN (NITROSTAT) 0.4 MG SL tablet Place 1 tablet (0.4 mg total) under the tongue every 5 (five) minutes as needed for chest pain (up to 3 doses). 25 tablet 4  . predniSONE (DELTASONE) 20 MG tablet Take 1-3 tablets (20-60 mg total) by mouth daily with breakfast. Take 3 tablets (60mg ) daily x 2 days, then 2 tablets (40mg ) daily x 3 days, then 1 tablet (20mg ) daily x 3 days then stop. 15 tablet 0  . PROAIR HFA 108 (90 Base) MCG/ACT inhaler Inhale 2 puffs into  the lungs every 6 (six) hours as needed. 2 puffs 3 times daily x 5 days then every 6 hours as needed. Shortness of breath or wheezing 18 g 3  . spironolactone (ALDACTONE) 25 MG tablet Take 25 mg by mouth daily.    Marland Kitchen thiamine 100 MG tablet Take 1 tablet (100 mg total) by mouth daily.    Marland Kitchen tiotropium (SPIRIVA HANDIHALER) 18 MCG inhalation capsule Place 1 capsule (18 mcg total) into inhaler and inhale daily. 30 capsule 6   No current facility-administered medications for this visit.      Past Medical History:  Diagnosis Date  . Automatic implantable cardioverter-defibrillator in situ   . CAD (coronary artery disease)    a. Inf-post MI 2008 s/p BMS to large marginal of Cx.   Marland Kitchen HYPERLIPIDEMIA-MIXED 02/07/2009  . HYPERTENSION, BENIGN 08/22/2009  . PVD (peripheral vascular disease) ([place])    a. Evaluated by Dr. [name] Anon 08/2012.  Marland Kitchen SYSTOLIC HEART FAILURE, CHRONIC 02/07/2009   a. EF 30% 2010, 29% by MRI 08/2012. b. s/p prophylactic Medtronic ICD implantation 10/2012.  Marland Kitchen TIA (transient ischemic attack) 05/18/2014  . Tobacco abuse   . VENTRICULAR TACHYCARDIA 05/21/2010    ROS:   All systems reviewed and negative except as noted in the HPI.  Past Surgical History:  Procedure Laterality Date  . CARDIAC DEFIBRILLATOR PLACEMENT  10/2012  . CESAREAN SECTION  1977; 1982  . CYSTOSCOPY W/ STONE MANIPULATION  1990's  . IMPLANTABLE CARDIOVERTER DEFIBRILLATOR IMPLANT N/A 10/21/2012   Procedure: IMPLANTABLE CARDIOVERTER DEFIBRILLATOR IMPLANT;  Surgeon: Evans Lance, MD;  Location: [facility] CATH LAB;  Service: Cardiovascular;  Laterality: N/A;     Family History  Problem Relation Age of Onset  . Cancer Mother     cervical  . Heart attack Father   . Asthma Brother   . Other Brother     Pacemaker     Social History   Social History  . Marital status: Single    Spouse name: N/A  . Number of children: N/A  . Years of education: N/A   Occupational History  . Not on file.   Social History Main Topics   . Smoking status: Current Every Day Smoker    Packs/day: 0.50    Years: 40.00    Types: Cigarettes  . Smokeless tobacco: Never Used  . Alcohol use 12.0 oz/week    20 Cans of beer per week     Comment: 05/18/2014 "2, 40's qod"  . Drug use: No  . Sexual activity: Yes   Other Topics Concern  . Not on file   Social History Narrative  . No narrative on file     BP (!) 158/96   Pulse 96   Ht 5' 9.5" (1.765 m)   Wt 178 lb 12.8 oz (81.1 kg)   BMI 26.03 kg/m   Physical Exam:  Well appearing middle aged woman, NAD HEENT: Unremarkable Neck: 6 cm JVD, no thyromegally Back:  No CVA tenderness Lungs:  Clear with no wheezes, rales, or rhonchi, well healed ICD incision HEART:  Regular rate rhythm, no murmurs, no rubs, no clicks Abd:  soft, positive bowel sounds, no organomegally, no rebound, no guarding Ext:  2 plus pulses, no edema, no cyanosis, no clubbing Skin:  No rashes no nodules Neuro:  CN II through XII intact, motor grossly intact  DEVICE  Normal device function.  See PaceArt for details.   Assess/Plan: 1. VT - she has had no recurrent episodes of VT. She will continue her current meds. 2. Chronic systolic heart failure - she has class 2 symptoms. I have asked her to continue her current meds and try to maintain a low sodium diet. 3. CAD - she denies anginal symptoms. No change in her meds. 4. ICD - Her medtronic single chamber device is working normally. Will recheck in several months. 5. Tobacco abuse - with the nicotine patch, she is down to 3 cigs a day. I have encouraged her to stop altogether.  Mikle Bosworth.D.

## 2016-12-16 NOTE — Patient Instructions (Signed)
Medication Instructions:  Your physician recommends that you continue on your current medications as directed. Please refer to the Current Medication list given to you today.   Labwork: None Ordered   Testing/Procedures: None Ordered   Follow-Up: Remote monitoring is used to monitor your ICD from home. This monitoring reduces the number of office visits required to check your device to one time per year. It allows us to keep an eye on the functioning of your device to ensure it is working properly. You are scheduled for a device check from home on 03/17/17. You may send your transmission at any time that day. If you have a wireless device, the transmission will be sent automatically. After your physician reviews your transmission, you will receive a postcard with your next transmission date.  Your physician wants you to follow-up in: 1 year with Dr. Taylor.  You will receive a reminder letter in the mail two months in advance. If you don't receive a letter, please call our office to schedule the follow-up appointment.   If you need a refill on your cardiac medications before your next appointment, please call your pharmacy.   Thank you for choosing [facility]! Michelle Swinyer, RN [phone]    

## 2016-12-20 LAB — CUP PACEART INCLINIC DEVICE CHECK
Battery Remaining Longevity: 102 mo
Battery Voltage: 2.99 V
Brady Statistic RV Percent Paced: 0.02 %
Date Time Interrogation Session: 20180409182240
HIGH POWER IMPEDANCE MEASURED VALUE: 86 Ohm
Implantable Lead Location: 753860
Implantable Lead Model: 6935
Lead Channel Impedance Value: 513 Ohm
Lead Channel Pacing Threshold Amplitude: 0.5 V
Lead Channel Sensing Intrinsic Amplitude: 14.625 mV
MDC IDC LEAD IMPLANT DT: 20140212
MDC IDC MSMT LEADCHNL RV IMPEDANCE VALUE: 456 Ohm
MDC IDC MSMT LEADCHNL RV PACING THRESHOLD PULSEWIDTH: 0.4 ms
MDC IDC PG IMPLANT DT: 20140212
MDC IDC SET LEADCHNL RV PACING AMPLITUDE: 2.5 V
MDC IDC SET LEADCHNL RV PACING PULSEWIDTH: 0.4 ms
MDC IDC SET LEADCHNL RV SENSING SENSITIVITY: 0.3 mV

## 2017-01-16 ENCOUNTER — Telehealth: Payer: Self-pay | Admitting: Cardiology

## 2017-01-16 NOTE — Telephone Encounter (Signed)
LMOVM reminding pt to send remote transmission.   

## 2017-01-21 NOTE — Progress Notes (Signed)
No ICM remote transmission received for 01/16/2017.  Have not been able to reach patient for ICM monthly follow up since 08/2016.  Patient will continue to be followed every 3 months by device clinic but no further monthly ICM calls since unable to reach.

## 2017-02-15 NOTE — Progress Notes (Deleted)
Patient ID: [name], female   DOB: [birth year], 61 y.o.   MRN: [id number]  PCP: Dr. Nonda Lou [facility]) Primary Cardiologist: Dr. Johnsie Cancel HF: Dr. Aundra Dubin  61 y.o.  with history of CAD s/p inferoposterior MI in 2008, PAD, COPD, and ischemic cardiomyopathy with chronic systolic CHF.  She has had no cardiac cath since 2008 at time of MI.  She has PAD with bilateral SFA occlusions followed by Dr [name] Anon.  Single lead medtronic AICD followed by Dr Lovena Le  EF previously 15% but Myoview 7/16 with EF 50%.  She had a recent myoview that showed large improvement of her EF since echo in 9/15.  Denies any claudications symptoms. Denies chest pain. She continues to smoke 4-5 cigarettes/day and has likely COPD, would be interested in using medication. She says she tried the patch with no success.  She denies any SOB, even on hills. Has moved and no longer has steps at her house, so is unsure how those would effect.  No orthopnea, PND, or bendopnea.  No lightheadedness or syncope.    Trying to cut back cigarettes CXR 07/2015 NAD   Echo 04/12/15 Study Conclusions  - Left ventricle: The cavity size was normal. Wall thickness was  normal. Systolic function was normal. The estimated ejection  fraction was in the range of 50% to 55%. There is akinesis of the  basal-midinferolateral and inferior myocardium. Doppler  parameters are consistent with abnormal left ventricular  relaxation (grade 1 diastolic dysfunction). - Aortic valve: There was trivial regurgitation.  Impressions:  - Inferior and inferior lateral akinesis with overall low normal LV  function; grade 1 diastolic dysfunction; trace AI and MR.  Lab Results  Component Value Date   CREATININE 0.57 05/20/2016   BUN <5 (L) 05/20/2016   NA 140 05/20/2016   K 3.7 05/20/2016   CL 106 05/20/2016   CO2 26 05/20/2016   Lab Results  Component Value Date   LDLCALC 60 05/19/2014      PMH: 1. PAD: 10/15 ABIs 0.76 right, 0.79 left  with bilateral SFA occlusions.  Follows with Dr [name] Anon.  2. CAD: Inferoposterior MI in 2008 with BMS to LCx.  Allergic to Plavix (rash).   3. COPD: Active smoker.  4. Ischemic cardiomyopathy:  - cMRI (2/13): Inferior and inferolateral scar, EF 29%  - Medtronic ICD in 2/14.  - Echo (9/15) with EF 15%, diffuse hypokinesis with akinesis of the anteroseptal, lateral and inferolateral walls, mild AI, mild MR, PA systolic pressure 39 mmHg.  - Nuclear stress 03/21/15 EF 50%, low risk study. 5. H/o VT: Has ICD.  6. Hyperlipidemia 7. HTN  SH: Smokes 4-5 cigs/day, no ETOH, lives in [place]  [place]: CAD  ROS: All systems reviewed and negative except as per HPI.    Current Outpatient Prescriptions  Medication Sig Dispense Refill  . acetaminophen (TYLENOL) 500 MG tablet Take 500 mg by mouth every 6 (six) hours as needed for headache.     Marland Kitchen aspirin 81 MG tablet Take 81 mg by mouth daily.    Marland Kitchen atorvastatin (LIPITOR) 10 MG tablet Take 1 tablet (10 mg total) by mouth daily at 6 PM. 30 tablet 11  . budesonide-formoterol (SYMBICORT) 160-4.5 MCG/ACT inhaler Inhale 2 puffs into the lungs 2 (two) times daily. 1 Inhaler 6  . carvedilol (COREG) 12.5 MG tablet TAKE 1 TABLET (12.5 MG TOTAL) BY MOUTH 2 (TWO) TIMES DAILY WITH A MEAL. 180 tablet 3  . ENTRESTO 97-103 MG TAKE 1 TABLET BY MOUTH 2 (TWO)  TIMES DAILY. 60 tablet 1  . feeding supplement, ENSURE COMPLETE, (ENSURE COMPLETE) LIQD Take 237 mLs by mouth daily.    . fluticasone (FLONASE) 50 MCG/ACT nasal spray Place 2 sprays into both nostrils daily.  2  . folic acid (FOLVITE) 1 MG tablet Take 1 tablet (1 mg total) by mouth daily.    . Multiple Vitamin (MULTIVITAMIN WITH MINERALS) TABS tablet Take 1 tablet by mouth daily.    . nitroGLYCERIN (NITROSTAT) 0.4 MG SL tablet Place 1 tablet (0.4 mg total) under the tongue every 5 (five) minutes as needed for chest pain (up to 3 doses). 25 tablet 4  . predniSONE (DELTASONE) 20 MG tablet Take 1-3 tablets (20-60 mg total)  by mouth daily with breakfast. Take 3 tablets (60mg ) daily x 2 days, then 2 tablets (40mg ) daily x 3 days, then 1 tablet (20mg ) daily x 3 days then stop. 15 tablet 0  . PROAIR HFA 108 (90 Base) MCG/ACT inhaler Inhale 2 puffs into the lungs every 6 (six) hours as needed. 2 puffs 3 times daily x 5 days then every 6 hours as needed. Shortness of breath or wheezing 18 g 3  . spironolactone (ALDACTONE) 25 MG tablet Take 25 mg by mouth daily.    Marland Kitchen thiamine 100 MG tablet Take 1 tablet (100 mg total) by mouth daily.    Marland Kitchen tiotropium (SPIRIVA HANDIHALER) 18 MCG inhalation capsule Place 1 capsule (18 mcg total) into inhaler and inhale daily. 30 capsule 6   No current facility-administered medications for this visit.    There were no vitals taken for this visit. Affect appropriate Thin black female HEENT: normal Neck supple with no adenopathy JVP normal no bruits no thyromegaly Lungs clear with no wheezing and good diaphragmatic motion Heart:  S1/S2 no murmur, no rub, gallop or click AICD under left clavicle  PMI normal Abdomen: benighn, BS positve, no tenderness, no AAA no bruit.  No HSM or HJR Distal pulses intact with no bruits No edema Neuro non-focal Skin warm and dry No muscular weakness     ECG:  12/02/14  SR rate 68 PVC ICLBBB  Assessment/Plan: 1. Chronic systolic euvolemic EF improved by last echo 50-55% continue medical RX  Including entresto will update echo since 2016 2. CAD: S/p inferoposterior MI with PCI in 2008.  Continue statin and ASA 81.  Myoview ok.03/2015  3. PAD: Bilateral SFA occlusions on arterial duplex in 10/15. No significant claudication.  Has followup with Dr [name] Anon. Needs to stop smoking.  4. Smoking: Active smoker but only 2-3 cigs/day.  Suspect COPD.  Strongly recommend cessation. Would consider medication.  Patches have not helped her. CXR 07/2015 ok   Jenkins Rouge

## 2017-02-19 ENCOUNTER — Ambulatory Visit: Payer: Medicare Other | Admitting: Cardiovascular Disease

## 2017-02-24 ENCOUNTER — Ambulatory Visit (INDEPENDENT_AMBULATORY_CARE_PROVIDER_SITE_OTHER): Payer: Medicare Other | Admitting: Nurse Practitioner

## 2017-02-24 ENCOUNTER — Encounter: Payer: Self-pay | Admitting: Nurse Practitioner

## 2017-02-24 ENCOUNTER — Encounter (INDEPENDENT_AMBULATORY_CARE_PROVIDER_SITE_OTHER): Payer: Self-pay

## 2017-02-24 VITALS — BP 138/74 | HR 88 | Ht 69.5 in | Wt 146.4 lb

## 2017-02-24 DIAGNOSIS — Z95810 Presence of automatic (implantable) cardiac defibrillator: Secondary | ICD-10-CM

## 2017-02-24 DIAGNOSIS — E7800 Pure hypercholesterolemia, unspecified: Secondary | ICD-10-CM | POA: Diagnosis not present

## 2017-02-24 DIAGNOSIS — I255 Ischemic cardiomyopathy: Secondary | ICD-10-CM

## 2017-02-24 DIAGNOSIS — I5022 Chronic systolic (congestive) heart failure: Secondary | ICD-10-CM | POA: Diagnosis not present

## 2017-02-24 DIAGNOSIS — I2589 Other forms of chronic ischemic heart disease: Secondary | ICD-10-CM

## 2017-02-24 LAB — HEPATIC FUNCTION PANEL
ALT: 51 IU/L — ABNORMAL HIGH (ref 0–32)
AST: 95 IU/L — ABNORMAL HIGH (ref 0–40)
Albumin: 4.1 g/dL (ref 3.5–5.5)
Alkaline Phosphatase: 119 IU/L — ABNORMAL HIGH (ref 39–117)
Bilirubin Total: 0.5 mg/dL (ref 0.0–1.2)
Bilirubin, Direct: 0.17 mg/dL (ref 0.00–0.40)
Total Protein: 7.3 g/dL (ref 6.0–8.5)

## 2017-02-24 LAB — BASIC METABOLIC PANEL
BUN/Creatinine Ratio: 9 (ref 9–23)
BUN: 7 mg/dL (ref 6–24)
CO2: 19 mmol/L — ABNORMAL LOW (ref 20–29)
Calcium: 9.6 mg/dL (ref 8.7–10.2)
Chloride: 100 mmol/L (ref 96–106)
Creatinine, Ser: 0.8 mg/dL (ref 0.57–1.00)
GFR calc Af Amer: 93 mL/min/{1.73_m2} (ref >59)
GFR calc non Af Amer: 81 mL/min/{1.73_m2} (ref >59)
Glucose: 122 mg/dL — ABNORMAL HIGH (ref 65–99)
Potassium: 4.5 mmol/L (ref 3.5–5.2)
Sodium: 139 mmol/L (ref 134–144)

## 2017-02-24 LAB — CBC
Hematocrit: 48.5 % — ABNORMAL HIGH (ref 34.0–46.6)
Hemoglobin: 16.4 g/dL — ABNORMAL HIGH (ref 11.1–15.9)
MCH: 31.7 pg (ref 26.6–33.0)
MCHC: 33.8 g/dL (ref 31.5–35.7)
MCV: 94 fL (ref 79–97)
Platelets: 125 10*3/uL — ABNORMAL LOW (ref 150–379)
RBC: 5.18 x10E6/uL (ref 3.77–5.28)
RDW: 13.5 % (ref 12.3–15.4)
WBC: 5.8 10*3/uL (ref 3.4–10.8)

## 2017-02-24 LAB — LIPID PANEL
Chol/HDL Ratio: 2.1 ratio (ref 0.0–4.4)
Cholesterol, Total: 153 mg/dL (ref 100–199)
HDL: 74 mg/dL (ref >39)
LDL Calculated: 67 mg/dL (ref 0–99)
Triglycerides: 61 mg/dL (ref 0–149)
VLDL Cholesterol Cal: 12 mg/dL (ref 5–40)

## 2017-02-24 MED ORDER — LOSARTAN POTASSIUM 100 MG PO TABS: 100.0000 mg | ORAL_TABLET | Freq: Every day | ORAL | 3 refills | Status: DC

## 2017-02-24 MED ORDER — SACUBITRIL-VALSARTAN 97-103 MG PO TABS: 1.0000 | ORAL_TABLET | Freq: Two times a day (BID) | ORAL | 3 refills | Status: DC

## 2017-02-24 MED ORDER — CARVEDILOL 12.5 MG PO TABS: ORAL_TABLET | ORAL | 3 refills | Status: DC

## 2017-02-24 NOTE — Patient Instructions (Addendum)
We will be checking the following labs today - BMET, CBC, HPF, Lipids   Medication Instructions:    Continue with your current medicines.  I sent in your refills today     Testing/Procedures To Be Arranged:  N/A  Follow-Up:   See Dr. Johnsie Cancel in 6 months and Dr. Lovena Le as planned.     Other Special Instructions:   Keep working on the smoking cessation.     If you need a refill on your cardiac medications before your next appointment, please call your pharmacy.   Call the [place] office at [phone] if you have any questions, problems or concerns.

## 2017-02-24 NOTE — Progress Notes (Signed)
CARDIOLOGY OFFICE NOTE  Date:  02/24/2017    [place] Date of Birth: 08-12-1956 Medical Record #[id number]  PCP:  Vicenta Aly, [place]  Cardiologist:  Nishan/Taylor    Chief Complaint  Patient presents with  . Cardiomyopathy    Follow up visit - seen for Dr. Johnsie Cancel    History of Present Illness: [name] is a 61 y.o. female who presents today for a follow up visit. Seen for Dr. Johnsie Cancel and Dr. Lovena Le.   She has a history of h/o CAD with inferoposterior MI in 2008, VT, PAD, COPD, ischemic CM, chronic systolic heart failure, s/p ICD implant. She has ongoing tobacco abuse.   EF previously down to 15% but improved by her Myoview 7/16 with EF 50%.  Saw Dr. Lovena Le back in April - felt to be doing ok. Last seen by Dr. Johnsie Cancel last June.   Comes in today. Here alone. She feels she is doing well. No chest pain. Breathing is ok. Rare dizziness. Taking medicines as prescribed. No ICD shocks. Down to smoking 4 cigs per day - she has cut down and is trying to quit. She feels like she is doing ok and has no real concerns today. Does need med refills. Has not seen her PCP in quite some time due to their location.   Past Medical History:  Diagnosis Date  . Automatic implantable cardioverter-defibrillator in situ   . CAD (coronary artery disease)    a. Inf-post MI 2008 s/p BMS to large marginal of Cx.   Marland Kitchen HYPERLIPIDEMIA-MIXED 02/07/2009  . HYPERTENSION, BENIGN 08/22/2009  . PVD (peripheral vascular disease) ([place])    a. Evaluated by Dr. [name] Anon 08/2012.  Marland Kitchen SYSTOLIC HEART FAILURE, CHRONIC 02/07/2009   a. EF 30% 2010, 29% by MRI 08/2012. b. s/p prophylactic Medtronic ICD implantation 10/2012.  Marland Kitchen TIA (transient ischemic attack) 05/18/2014  . Tobacco abuse   . VENTRICULAR TACHYCARDIA 05/21/2010    Past Surgical History:  Procedure Laterality Date  . CARDIAC DEFIBRILLATOR PLACEMENT  10/2012  . CESAREAN SECTION  1977; 1982  . CYSTOSCOPY W/ STONE MANIPULATION  1990's  . IMPLANTABLE  CARDIOVERTER DEFIBRILLATOR IMPLANT N/A 10/21/2012   Procedure: IMPLANTABLE CARDIOVERTER DEFIBRILLATOR IMPLANT;  Surgeon: Evans Lance, MD;  Location: [facility] CATH LAB;  Service: Cardiovascular;  Laterality: N/A;     Medications: Current Outpatient Prescriptions  Medication Sig Dispense Refill  . acetaminophen (TYLENOL) 500 MG tablet Take 500 mg by mouth every 6 (six) hours as needed for headache.     Marland Kitchen aspirin 81 MG tablet Take 81 mg by mouth daily.    . budesonide-formoterol (SYMBICORT) 160-4.5 MCG/ACT inhaler Inhale 2 puffs into the lungs 2 (two) times daily. 1 Inhaler 6  . carvedilol (COREG) 12.5 MG tablet TAKE 1 TABLET (12.5 MG TOTAL) BY MOUTH 2 (TWO) TIMES DAILY WITH A MEAL. 180 tablet 3  . feeding supplement, ENSURE COMPLETE, (ENSURE COMPLETE) LIQD Take 237 mLs by mouth daily.    . fluticasone (FLONASE) 50 MCG/ACT nasal spray Place 2 sprays into both nostrils as needed for allergies or rhinitis.    Marland Kitchen losartan (COZAAR) 100 MG tablet Take 1 tablet (100 mg total) by mouth daily. 90 tablet 3  . nitroGLYCERIN (NITROSTAT) 0.4 MG SL tablet Place 1 tablet (0.4 mg total) under the tongue every 5 (five) minutes as needed for chest pain (up to 3 doses). 25 tablet 4  . PROAIR HFA 108 (90 Base) MCG/ACT inhaler Inhale 2 puffs into the lungs every 6 (six)  hours as needed. 2 puffs 3 times daily x 5 days then every 6 hours as needed. Shortness of breath or wheezing 18 g 3  . sacubitril-valsartan (ENTRESTO) 97-103 MG Take 1 tablet by mouth 2 (two) times daily. 180 tablet 3   No current facility-administered medications for this visit.     Allergies: Allergies  Allergen Reactions  . Clopidogrel Bisulfate Rash    Social History: The patient  reports that she has been smoking Cigarettes.  She has a 20.00 pack-year smoking history. She has never used smokeless tobacco. She reports that she drinks about 12.0 oz of alcohol per week . She reports that she does not use drugs.   Family History: The patient's  family history includes Asthma in her brother; Cancer in her mother; Heart attack in her father; Other in her brother.   Review of Systems: Please see the history of present illness.   Otherwise, the review of systems is positive for none.   All other systems are reviewed and negative.   Physical Exam: VS:  BP 138/74 (BP Location: Left Arm, Patient Position: Sitting, Cuff Size: Normal)   Pulse 88   Ht 5' 9.5" (1.765 m)   Wt 146 lb 6.4 oz (66.4 kg)   BMI 21.31 kg/m  .  BMI Body mass index is 21.31 kg/m.  Wt Readings from Last 3 Encounters:  02/24/17 146 lb 6.4 oz (66.4 kg)  12/16/16 178 lb 12.8 oz (81.1 kg)  05/19/16 136 lb 12.8 oz (62.1 kg)    General: Pleasant. Tall and thin. She is alert and in no acute distress.   HEENT: Normal.  Neck: Supple, no JVD, carotid bruits, or masses noted.  Cardiac: Regular rate and rhythm with occasional ectopic. No edema.  Respiratory:  Lungs are clear to auscultation bilaterally with normal work of breathing.  GI: Soft and nontender.  MS: No deformity or atrophy. Gait and ROM intact.  Skin: Warm and dry. Color is normal.  Neuro:  Strength and sensation are intact and no gross focal deficits noted.  Psych: Alert, appropriate and with normal affect.   LABORATORY DATA:  EKG:  EKG is ordered today. This demonstrates NSR, prior inferior infarct, lateral T wave changes and PVC.  Lab Results  Component Value Date   WBC 6.6 05/20/2016   HGB 14.6 05/20/2016   HCT 43.7 05/20/2016   PLT 118 (L) 05/20/2016   GLUCOSE 128 (H) 05/20/2016   CHOL 136 05/19/2014   TRIG 57 05/19/2014   HDL 65 05/19/2014   LDLCALC 60 05/19/2014   ALT 26 05/17/2016   AST 48 (H) 05/17/2016   NA 140 05/20/2016   K 3.7 05/20/2016   CL 106 05/20/2016   CREATININE 0.57 05/20/2016   BUN <5 (L) 05/20/2016   CO2 26 05/20/2016   TSH 3.301 Test methodology is 3rd generation TSH 03/05/2010   INR 0.93 05/18/2014   HGBA1C 5.4 05/18/2014   MICROALBUR 0.57 05/16/2009      BNP (last 3 results)  Recent Labs  05/17/16 0045  BNP 54.1    ProBNP (last 3 results) No results for input(s): PROBNP in the last 8760 hours.   Other Studies Reviewed Today:  Myoview Study Highlights 03/2015    Nuclear stress EF: 50%.  There was no ST segment deviation noted during stress.  This is a low risk study.  The left ventricular ejection fraction is mildly decreased (45-54%).   Small area of apical and lateral wall infarct no ischemia EF 50%  Echo Study Conclusions 04/2015  - Left ventricle: The cavity size was normal. Wall thickness was   normal. Systolic function was normal. The estimated ejection   fraction was in the range of 50% to 55%. There is akinesis of the   basal-midinferolateral and inferior myocardium. Doppler   parameters are consistent with abnormal left ventricular   relaxation (grade 1 diastolic dysfunction). - Aortic valve: There was trivial regurgitation.  Impressions:  - Inferior and inferior lateral akinesis with overall low normal LV   function; grade 1 diastolic dysfunction; trace AI and MR.  Assessment/Plan:  1. Ischemic CM with chronic systolic HF - she appears to be euvolemic - her EF improved by last echo 50-55% -would continue with current medical RX - needs labs today. Meds refilled.   2. CAD: S/p inferoposterior MI with PCI in 2008.   Myoview from 03/2015. She has no symptoms of chest pain - would keep on her current regimen. CV risk factor modification.   3. PAD: Bilateral SFA occlusions on arterial duplex in 10/15.  No significant claudication.   Needs to stop smoking.   4. Smoking: Down to 4 cigs/day.   Strongly recommend cessation.   5. Underlying ICD - followed by EP - no shocks noted.   Current medicines are reviewed with the patient today.  The patient does not have concerns regarding medicines other than what has been noted above.  The following changes have been made:  See above.  Labs/ tests  ordered today include:    Orders Placed This Encounter  Procedures  . Basic metabolic panel  . CBC  . Hepatic function panel  . Lipid panel  . EKG 12-Lead     Disposition:   FU with Dr. Johnsie Cancel in 6 months. See Dr. Lovena Le as planned. Will get labs today since she is basically not seeing primary care.    Patient is agreeable to this plan and will call if any problems develop in the interim.   SignedTruitt Merle, NP  02/24/2017 12:03 PM  [facility] [address] [place],   [address] Phone: [phone] Fax: [phone]

## 2017-03-05 ENCOUNTER — Other Ambulatory Visit: Payer: Self-pay | Admitting: *Deleted

## 2017-03-05 DIAGNOSIS — R945 Abnormal results of liver function studies: Principal | ICD-10-CM

## 2017-03-05 DIAGNOSIS — R7989 Other specified abnormal findings of blood chemistry: Secondary | ICD-10-CM

## 2017-03-11 ENCOUNTER — Other Ambulatory Visit: Payer: Medicare Other | Admitting: *Deleted

## 2017-03-11 DIAGNOSIS — R7989 Other specified abnormal findings of blood chemistry: Secondary | ICD-10-CM | POA: Diagnosis not present

## 2017-03-11 DIAGNOSIS — R945 Abnormal results of liver function studies: Principal | ICD-10-CM

## 2017-03-12 LAB — HEPATIC FUNCTION PANEL
ALT: 30 IU/L (ref 0–32)
AST: 47 IU/L — ABNORMAL HIGH (ref 0–40)
Albumin: 3.9 g/dL (ref 3.5–5.5)
Alkaline Phosphatase: 98 IU/L (ref 39–117)
Bilirubin Total: 0.4 mg/dL (ref 0.0–1.2)
Bilirubin, Direct: 0.13 mg/dL (ref 0.00–0.40)
Total Protein: 6.9 g/dL (ref 6.0–8.5)

## 2017-03-17 ENCOUNTER — Telehealth: Payer: Self-pay | Admitting: Cardiology

## 2017-03-17 ENCOUNTER — Ambulatory Visit (INDEPENDENT_AMBULATORY_CARE_PROVIDER_SITE_OTHER): Payer: Medicare Other | Admitting: *Deleted

## 2017-03-17 DIAGNOSIS — I255 Ischemic cardiomyopathy: Secondary | ICD-10-CM

## 2017-03-17 NOTE — Telephone Encounter (Signed)
Spoke with pt and reminded pt of remote transmission that is due today. Pt verbalized understanding.   

## 2017-03-18 LAB — CUP PACEART REMOTE DEVICE CHECK
Date Time Interrogation Session: 20180709190754
HIGH POWER IMPEDANCE MEASURED VALUE: 81 Ohm
Implantable Lead Implant Date: 20140212
Implantable Lead Location: 753860
Implantable Pulse Generator Implant Date: 20140212
Lead Channel Pacing Threshold Amplitude: 0.625 V
Lead Channel Pacing Threshold Pulse Width: 0.4 ms
Lead Channel Sensing Intrinsic Amplitude: 13.125 mV
Lead Channel Sensing Intrinsic Amplitude: 13.125 mV
Lead Channel Setting Pacing Pulse Width: 0.4 ms
MDC IDC MSMT BATTERY REMAINING LONGEVITY: 96 mo
MDC IDC MSMT BATTERY VOLTAGE: 3.01 V
MDC IDC MSMT LEADCHNL RV IMPEDANCE VALUE: 513 Ohm
MDC IDC MSMT LEADCHNL RV IMPEDANCE VALUE: 570 Ohm
MDC IDC SET LEADCHNL RV PACING AMPLITUDE: 2.5 V
MDC IDC SET LEADCHNL RV SENSING SENSITIVITY: 0.3 mV
MDC IDC STAT BRADY RV PERCENT PACED: 0.01 %

## 2017-03-18 NOTE — Progress Notes (Signed)
Remote ICD transmission.   

## 2017-03-22 ENCOUNTER — Emergency Department (HOSPITAL_COMMUNITY)
Admission: EM | Admit: 2017-03-22 | Discharge: 2017-03-22 | Disposition: A | Discharge status: Home / Self Care | Payer: Medicare Other | Attending: Emergency Medicine | Admitting: Emergency Medicine

## 2017-03-22 ENCOUNTER — Emergency Department (HOSPITAL_COMMUNITY): Payer: Medicare Other

## 2017-03-22 ENCOUNTER — Encounter (HOSPITAL_COMMUNITY): Payer: Self-pay | Admitting: Emergency Medicine

## 2017-03-22 DIAGNOSIS — F17210 Nicotine dependence, cigarettes, uncomplicated: Secondary | ICD-10-CM | POA: Insufficient documentation

## 2017-03-22 DIAGNOSIS — J449 Chronic obstructive pulmonary disease, unspecified: Secondary | ICD-10-CM | POA: Diagnosis not present

## 2017-03-22 DIAGNOSIS — I2510 Atherosclerotic heart disease of native coronary artery without angina pectoris: Secondary | ICD-10-CM | POA: Insufficient documentation

## 2017-03-22 DIAGNOSIS — R0789 Other chest pain: Secondary | ICD-10-CM | POA: Insufficient documentation

## 2017-03-22 DIAGNOSIS — Z8673 Personal history of transient ischemic attack (TIA), and cerebral infarction without residual deficits: Secondary | ICD-10-CM | POA: Insufficient documentation

## 2017-03-22 DIAGNOSIS — Z79899 Other long term (current) drug therapy: Secondary | ICD-10-CM | POA: Diagnosis not present

## 2017-03-22 DIAGNOSIS — I509 Heart failure, unspecified: Secondary | ICD-10-CM | POA: Insufficient documentation

## 2017-03-22 DIAGNOSIS — R911 Solitary pulmonary nodule: Secondary | ICD-10-CM

## 2017-03-22 DIAGNOSIS — R079 Chest pain, unspecified: Secondary | ICD-10-CM | POA: Diagnosis not present

## 2017-03-22 LAB — COMPREHENSIVE METABOLIC PANEL
ALT: 40 U/L (ref 14–54)
AST: 70 U/L — AB (ref 15–41)
Albumin: 3.4 g/dL — ABNORMAL LOW (ref 3.5–5.0)
Alkaline Phosphatase: 83 U/L (ref 38–126)
Anion gap: 10 (ref 5–15)
BILIRUBIN TOTAL: 0.5 mg/dL (ref 0.3–1.2)
BUN: 7 mg/dL (ref 6–20)
CHLORIDE: 104 mmol/L (ref 101–111)
CO2: 18 mmol/L — ABNORMAL LOW (ref 22–32)
CREATININE: 0.57 mg/dL (ref 0.44–1.00)
Calcium: 9 mg/dL (ref 8.9–10.3)
Glucose, Bld: 93 mg/dL (ref 65–99)
Potassium: 4.5 mmol/L (ref 3.5–5.1)
Sodium: 132 mmol/L — ABNORMAL LOW (ref 135–145)
TOTAL PROTEIN: 7.2 g/dL (ref 6.5–8.1)

## 2017-03-22 LAB — CBC WITH DIFFERENTIAL/PLATELET
BASOS ABS: 0 10*3/uL (ref 0.0–0.1)
BASOS PCT: 0 %
EOS ABS: 0.5 10*3/uL (ref 0.0–0.7)
EOS PCT: 6 %
HCT: 45.9 % (ref 36.0–46.0)
Hemoglobin: 15.5 g/dL — ABNORMAL HIGH (ref 12.0–15.0)
Lymphocytes Relative: 64 %
Lymphs Abs: 5 10*3/uL — ABNORMAL HIGH (ref 0.7–4.0)
MCH: 32 pg (ref 26.0–34.0)
MCHC: 33.8 g/dL (ref 30.0–36.0)
MCV: 94.8 fL (ref 78.0–100.0)
MONO ABS: 0.6 10*3/uL (ref 0.1–1.0)
Monocytes Relative: 8 %
Neutro Abs: 1.7 10*3/uL (ref 1.7–7.7)
Neutrophils Relative %: 22 %
PLATELETS: 87 10*3/uL — AB (ref 150–400)
RBC: 4.84 MIL/uL (ref 3.87–5.11)
RDW: 13.4 % (ref 11.5–15.5)
WBC: 7.8 10*3/uL (ref 4.0–10.5)

## 2017-03-22 LAB — I-STAT TROPONIN, ED
TROPONIN I, POC: 0.01 ng/mL (ref 0.00–0.08)
Troponin i, poc: 0.01 ng/mL (ref 0.00–0.08)

## 2017-03-22 LAB — LIPASE, BLOOD: LIPASE: 43 U/L (ref 11–51)

## 2017-03-22 LAB — D-DIMER, QUANTITATIVE (NOT AT ARMC): D DIMER QUANT: 0.81 ug{FEU}/mL — AB (ref 0.00–0.50)

## 2017-03-22 MED ORDER — IOPAMIDOL (ISOVUE-370) INJECTION 76%
INTRAVENOUS | Status: AC
  Administered 2017-03-22: 100 mL
  Filled 2017-03-22: qty 100

## 2017-03-22 MED ORDER — TRAMADOL HCL 50 MG PO TABS: 50.0000 mg | ORAL_TABLET | Freq: Four times a day (QID) | ORAL | 0 refills | Status: DC | PRN

## 2017-03-22 MED ORDER — FENTANYL CITRATE (PF) 100 MCG/2ML IJ SOLN
50.0000 ug | Freq: Once | INTRAMUSCULAR | Status: AC
  Administered 2017-03-22: 50 ug via INTRAVENOUS
  Filled 2017-03-22: qty 2

## 2017-03-22 MED ORDER — ALBUTEROL SULFATE (2.5 MG/3ML) 0.083% IN NEBU: 5.0000 mg | INHALATION_SOLUTION | Freq: Once | RESPIRATORY_TRACT | Status: DC

## 2017-03-22 NOTE — ED Notes (Signed)
Declined W/C at D/C and was escorted to lobby by RN. 

## 2017-03-22 NOTE — ED Provider Notes (Signed)
[facility] DEPT Provider Note   CSN: [id number] Arrival date & time: 03/22/17  0505     History   Chief Complaint Chief Complaint  Patient presents with  . Chest Pain    HPI [name] is a 61 y.o. female.  HPI  61 year old female with a history of coronary disease with a stent in 2008, history of VT, CHF, and implantable defibrillator along with COPD presents with left-sided chest pain. Started around 12 AM. Has been constant since starting. It is in her left midaxillary lower chest and now is in the front of her lower chest as well. It is sharp in nature. Taking deep breaths or rotating to the left seems to make it worse. It is not significant at all with no deep breaths. There is no shortness of breath. No coughing. She has had what she thought were allergies with sneezing and rhinorrhea for the last 2 days. No fevers. No leg swelling or leg pain. No recent travel, surgery, or prior history of DVT/PE. Pain is about a 5/10. She tried albuterol and EMS gave her aspirin and nitroglycerin with no significant change. Does not feel like her prior MI. Does not feel like her typical COPD. She did drink some alcohol tonight.  Past Medical History:  Diagnosis Date  . Automatic implantable cardioverter-defibrillator in situ   . CAD (coronary artery disease)    a. Inf-post MI 2008 s/p BMS to large marginal of Cx.   Marland Kitchen HYPERLIPIDEMIA-MIXED 02/07/2009  . HYPERTENSION, BENIGN 08/22/2009  . PVD (peripheral vascular disease) ([place])    a. Evaluated by Dr. [name] Anon 08/2012.  Marland Kitchen SYSTOLIC HEART FAILURE, CHRONIC 02/07/2009   a. EF 30% 2010, 29% by MRI 08/2012. b. s/p prophylactic Medtronic ICD implantation 10/2012.  Marland Kitchen TIA (transient ischemic attack) 05/18/2014  . Tobacco abuse   . VENTRICULAR TACHYCARDIA 05/21/2010    Patient Active Problem List   Diagnosis Date Noted  . COPD exacerbation ([place])   . CAP (community acquired pneumonia) 05/17/2016  . Essential hypertension   . COPD with acute  exacerbation ([place]) 12/02/2014  . Systolic heart failure ([place]) 12/02/2014  . Dyspnea 12/02/2014  . Alcohol use ([place]) 12/02/2014  . Elevated hemoglobin ([place]) 12/02/2014  . Acute bronchitis 12/02/2014  . Acute respiratory failure with hypoxia ([place])   . Malnutrition of moderate degree ([place]) 05/19/2014  . Right facial numbness 05/18/2014  . Weakness of right lower extremity 05/18/2014  . TIA (transient ischemic attack) 05/18/2014  . 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  . Smoker 08/11/2012  . PVD (peripheral vascular disease) ([place]) 08/11/2012  . VENTRICULAR TACHYCARDIA 05/21/2010  . BEN HTN HEART DISEASE WITHOUT HEART FAIL 08/28/2009  . HYPERTENSION, BENIGN 08/22/2009  . CHEST PAIN-PRECORDIAL 07/18/2009  . CARDIOVASCULAR FUNCTION STUDY, ABNORMAL 07/18/2009  . Elevated lipids 02/07/2009  . CAD, NATIVE VESSEL 02/07/2009  . Congestive dilated cardiomyopathy ([place]) 02/07/2009  . SYSTOLIC HEART FAILURE, CHRONIC 02/07/2009    Past Surgical History:  Procedure Laterality Date  . CARDIAC DEFIBRILLATOR PLACEMENT  10/2012  . CESAREAN SECTION  1977; 1982  . CYSTOSCOPY W/ STONE MANIPULATION  1990's  . IMPLANTABLE CARDIOVERTER DEFIBRILLATOR IMPLANT N/A 10/21/2012   Procedure: IMPLANTABLE CARDIOVERTER DEFIBRILLATOR IMPLANT;  Surgeon: Evans Lance, MD;  Location: [facility] CATH LAB;  Service: Cardiovascular;  Laterality: N/A;    OB History    No data available       Home Medications    Prior to Admission medications   Medication Sig Start Date End Date Taking? Authorizing Provider  acetaminophen (TYLENOL) 500 MG tablet Take 500 mg by mouth every 6 (six) hours as needed for headache.    Yes [provider]  aspirin 81 MG tablet Take 81 mg by mouth daily.   Yes [provider]  budesonide-formoterol (SYMBICORT) 160-4.5 MCG/ACT inhaler Inhale 2 puffs into the lungs 2 (two) times daily. 05/20/16  Yes Eugenie Filler, MD  carvedilol (COREG) 12.5 MG  tablet TAKE 1 TABLET (12.5 MG TOTAL) BY MOUTH 2 (TWO) TIMES DAILY WITH A MEAL. 02/24/17  Yes Burtis Junes, NP  feeding supplement, ENSURE COMPLETE, (ENSURE COMPLETE) LIQD Take 237 mLs by mouth daily. 05/19/14  Yes Mikhail, Velta [place], DO  fluticasone (FLONASE) 50 MCG/ACT nasal spray Place 2 sprays into both nostrils as needed for allergies or rhinitis.   Yes [provider]  losartan (COZAAR) 100 MG tablet Take 1 tablet (100 mg total) by mouth daily. 02/24/17  Yes Burtis Junes, NP  nitroGLYCERIN (NITROSTAT) 0.4 MG SL tablet Place 1 tablet (0.4 mg total) under the tongue every 5 (five) minutes as needed for chest pain (up to 3 doses). 06/20/14  Yes [name], MD  PROAIR HFA 108 [phone] Base) MCG/ACT inhaler Inhale 2 puffs into the lungs every 6 (six) hours as needed. 2 puffs 3 times daily x 5 days then every 6 hours as needed. Shortness of breath or wheezing Patient taking differently: Inhale 2 puffs into the lungs every 6 (six) hours as needed for wheezing.  05/20/16  Yes Eugenie Filler, MD  sacubitril-valsartan (ENTRESTO) 97-103 MG Take 1 tablet by mouth 2 (two) times daily. 02/24/17  Yes Burtis Junes, NP    Family History Family History  Problem Relation Age of Onset  . Cancer Mother        cervical  . Heart attack Father   . Asthma Brother   . Other Brother        Pacemaker    Social History Social History  Substance Use Topics  . Smoking status: Current Every Day Smoker    Packs/day: 0.50    Years: 40.00    Types: Cigarettes  . Smokeless tobacco: Never Used  . Alcohol use 12.0 oz/week    20 Cans of beer per week     Comment: 05/18/2014 "2, 40's qod"     Allergies   Clopidogrel bisulfate   Review of Systems Review of Systems  Constitutional: Negative for fever.  HENT: Positive for rhinorrhea and sneezing.   Respiratory: Negative for cough and shortness of breath.   Cardiovascular: Positive for chest pain.  Gastrointestinal: Negative for abdominal pain  and vomiting.  Musculoskeletal: Positive for back pain.  All other systems reviewed and are negative.    Physical Exam Updated Vital Signs BP 124/71   Pulse 80   Temp 97.9 F (36.6 C) (Oral)   SpO2 94%   Physical Exam  Constitutional: She is oriented to person, place, and time. She appears well-developed and well-nourished.  HENT:  Head: Normocephalic and atraumatic.  Right Ear: External ear normal.  Left Ear: External ear normal.  Nose: Nose normal.  Eyes: Right eye exhibits no discharge. Left eye exhibits no discharge.  Cardiovascular: Normal rate, regular rhythm and normal heart sounds.   Pulses:      Radial pulses are 2+ on the right side, and 2+ on the left side.  Pulmonary/Chest: Effort normal and breath sounds normal. She has no wheezes. She exhibits tenderness.    No rash  Abdominal: Soft. There is  tenderness in the left upper quadrant.    Musculoskeletal: She exhibits no edema.  Neurological: She is alert and oriented to person, place, and time.  Skin: Skin is warm and dry. She is not diaphoretic.  Nursing note and vitals reviewed.    ED Treatments / Results  Labs (all labs ordered are listed, but only abnormal results are displayed) Labs Reviewed  COMPREHENSIVE METABOLIC PANEL - Abnormal; Notable for the following:       Result Value   Sodium 132 (*)    CO2 18 (*)    Albumin 3.4 (*)    AST 70 (*)    All other components within normal limits  CBC WITH DIFFERENTIAL/PLATELET - Abnormal; Notable for the following:    Hemoglobin 15.5 (*)    Platelets 87 (*)    Lymphs Abs 5.0 (*)    All other components within normal limits  D-DIMER, QUANTITATIVE (NOT AT [facility]) - Abnormal; Notable for the following:    D-Dimer, Quant 0.81 (*)    All other components within normal limits  LIPASE, BLOOD  I-STAT TROPOININ, ED    EKG  EKG Interpretation  Date/Time:  Saturday March 22 2017 06:03:12 EDT Ventricular Rate:  60 PR Interval:    QRS Duration: 111 QT  Interval:  470 QTC Calculation: 470 R Axis:   12 Text Interpretation:  Sinus rhythm Incomplete left bundle branch block Low voltage, extremity leads Probable left ventricular hypertrophy Anterior Q waves, possibly due to LVH no significant change since earlier in the day or sept 2017 Confirmed by Sherwood Gambler [phone]) on 03/22/2017 6:15:54 AM       Radiology Dg Chest 2 View  Result Date: 03/22/2017 CLINICAL DATA:  61 year old female with left-sided chest pain. EXAM: CHEST  2 VIEW COMPARISON:  Chest CT dated 05/17/2016 FINDINGS: The lungs are clear. There is no pleural effusion or pneumothorax. The cardiac silhouette is within normal limits. Left pectoral AICD device. No acute osseous pathology. Right upper quadrant cholecystectomy clips. IMPRESSION: No active cardiopulmonary disease. Electronically Signed   By: Anner Crete M.D.   On: 03/22/2017 06:07   Ct Angio Chest Pe W/cm &/or Wo Cm  Result Date: 03/22/2017 CLINICAL DATA:  Chest pain and shortness of breath since tonight. Smoker. EXAM: CT ANGIOGRAPHY CHEST WITH CONTRAST TECHNIQUE: Multidetector CT imaging of the chest was performed using the standard protocol during bolus administration of intravenous contrast. Multiplanar CT image reconstructions and MIPs were obtained to evaluate the vascular anatomy. CONTRAST:  100 cc Isovue 370 COMPARISON:  Chest radiographs obtained earlier today. Chest CTA dated 05/17/2016 and 03/05/2010. FINDINGS: Cardiovascular: Normally opacified pulmonary arteries with no pulmonary arterial filling defects seen. Small amount coronary artery calcification as well as very stent. Aortic calcification. Mediastinum/Nodes: Mildly prominent right hilar lymph nodes without significant change. Largest node has a short axis diameter of 8 mm on image number 90 of series 5. No abnormally enlarged mediastinal or hilar nodes are seen. Lungs/Pleura: Bilateral bullous changes and prominence of the interstitial markings with little  change. Interval triangular-shaped nodular opacity in the lateral aspect of the left upper lobe with a maximum diameter of 7 mm on image number 66 of series 6. A 4 mm nodule in the left upper lobe on image number 68 series 6 is unchanged since 05/17/2016, not seen on 03/05/2010. 7 mm subpleural nodule in the right upper lobe on image number 63 of series 6 with a linear component on sagittal images. This has not changed significantly 05/17/2016 with minimal linear  density at that location on 03/05/2010. No pleural fluid. Upper Abdomen: Cholecystectomy clips. Atheromatous arterial calcifications, including the origins of the renal arteries. Musculoskeletal: Mild thoracic and lower cervical spine degenerative changes. Review of the MIP images confirms the above findings. IMPRESSION: 1. No pulmonary emboli or acute abnormality. 2. Mildly progressive changes of COPD. 3. Interval 7 mm subpleural triangular-shaped nodular opacity in the left upper lobe, most likely representing a small focus of atelectasis. 4. Single 4 mm nodule in each upper lobe without change. Non-contrast chest CT can be considered in 12 months since the patient is high-risk. This recommendation follows the consensus statement: Guidelines for Management of Incidental Pulmonary Nodules Detected on CT Images: From the Fleischner Society 2017; Radiology 2017; 284:228-243. 5. Mild calcified coronary artery atherosclerosis stent mild calcified aortic atherosclerosis. Aortic Atherosclerosis (ICD10-I70.0) and Emphysema (ICD10-J43.9). Electronically Signed   By: Claudie Revering M.D.   On: 03/22/2017 07:54    Procedures Procedures (including critical care time)  Medications Ordered in ED Medications  albuterol (PROVENTIL) (2.5 MG/3ML) 0.083% nebulizer solution 5 mg (not administered)  fentaNYL (SUBLIMAZE) injection 50 mcg (50 mcg Intravenous Given 03/22/17 0623)  iopamidol (ISOVUE-370) 76 % injection (100 mLs  Contrast Given 03/22/17 0654)     Initial  Impression / Assessment and Plan / ED Course  I have reviewed the triage vital signs and the nursing notes.  Pertinent labs & imaging results that were available during my care of the patient were reviewed by me and considered in my medical decision making (see chart for details).     Patient is currently feeling better. I think this is most likely chest wall pain. However given her age and history of prior MI, will get a second troponin for this otherwise atypical chest pain. D-dimer was positive and CT was obtained which is negative for PE. She was made aware of the nodules and that she will need repeat CT scan by her PCP. Second troponin is pending and care was transferred to Dr. Tamera Punt, with plan to d/c if second troponin negative.   Final Clinical Impressions(s) / ED Diagnoses   Final diagnoses:  Left-sided chest wall pain    New Prescriptions New Prescriptions   No medications on file     Sherwood Gambler, MD 03/22/17 [phone]

## 2017-03-22 NOTE — ED Notes (Signed)
Patient transported to X-ray 

## 2017-03-22 NOTE — ED Provider Notes (Signed)
Patient's second troponin was negative. Her pain seems to be very atypical. She only has pain with movement and deep breathing. She does say that she did a lot work around the house that may have exacerbated her chest wall. There is no evidence of pulmonary embolus. No evidence of acute coronary syndrome. She does have pulmonary nodule which will need routine monitoring and I did advise the patient of this and putting her discharge papers. She was encouraged to have follow-up with her PCP. She was discharged home in good condition. She was given a prescription for tramadol for pain. Return precautions were given.   Malvin Johns, MD 03/22/17 [phone]

## 2017-03-22 NOTE — ED Triage Notes (Signed)
Pt comes from home via EMS for chest pain. Approx 0000 pain started in patients back and moved around under left breast. Pt c/o not being able to get a complete breath. Pt gave self a nebulizer at home without relief. 324 ASA and 1 nitro given by EMS with out relief. Pt does have cardiac hx and pacemaker

## 2017-03-24 ENCOUNTER — Encounter: Payer: Self-pay | Admitting: Cardiology

## 2017-05-03 ENCOUNTER — Emergency Department (HOSPITAL_COMMUNITY)
Admission: EM | Admit: 2017-05-03 | Discharge: 2017-05-03 | Disposition: A | Discharge status: Home / Self Care | Payer: Medicare Other | Attending: Emergency Medicine | Admitting: Emergency Medicine

## 2017-05-03 ENCOUNTER — Encounter (HOSPITAL_COMMUNITY): Payer: Self-pay

## 2017-05-03 DIAGNOSIS — F17210 Nicotine dependence, cigarettes, uncomplicated: Secondary | ICD-10-CM | POA: Insufficient documentation

## 2017-05-03 DIAGNOSIS — I2510 Atherosclerotic heart disease of native coronary artery without angina pectoris: Secondary | ICD-10-CM | POA: Insufficient documentation

## 2017-05-03 DIAGNOSIS — R202 Paresthesia of skin: Secondary | ICD-10-CM

## 2017-05-03 DIAGNOSIS — Z8673 Personal history of transient ischemic attack (TIA), and cerebral infarction without residual deficits: Secondary | ICD-10-CM | POA: Diagnosis not present

## 2017-05-03 DIAGNOSIS — Z79899 Other long term (current) drug therapy: Secondary | ICD-10-CM | POA: Diagnosis not present

## 2017-05-03 DIAGNOSIS — R200 Anesthesia of skin: Secondary | ICD-10-CM | POA: Diagnosis present

## 2017-05-03 DIAGNOSIS — I5022 Chronic systolic (congestive) heart failure: Secondary | ICD-10-CM | POA: Insufficient documentation

## 2017-05-03 DIAGNOSIS — I110 Hypertensive heart disease with heart failure: Secondary | ICD-10-CM | POA: Diagnosis not present

## 2017-05-03 DIAGNOSIS — Z7982 Long term (current) use of aspirin: Secondary | ICD-10-CM | POA: Insufficient documentation

## 2017-05-03 NOTE — ED Provider Notes (Signed)
[facility] DEPT Provider Note   CSN: [id number] Arrival date & time: 05/03/17  1532     History   Chief Complaint Chief Complaint  Patient presents with  . Numbness  . Back Pain  . Pruritis    HPI [name] is a 61 y.o. female.  [name] is a 61 y.o. Female with a history of PVD, CAD, and TIA, who presents to the ED complaining of left leg numbness and left wrist numbness. She reports she began having numbness to her left leg from just below her knee to her ankle yesterday. She reports the numbness surrounded her leg and was not worse on one side or the other and was not only on one side of her leg. It did not extend into her foot or toes. She denies any trouble walking or pain to her leg. She reports her symptoms have now resolved. She reports earlier today she also had numbness surrounding her left wrist earlier today that is now resolved. It did not extend into her hand or fingers and she had no pain. She reports this is also now resolved. Patient also tells me she had a brief period of some back pain that lasted a few seconds to her left lower back extending down into her left buttocks. She reports this is also now resolved. She currently feels back to normal. She's had no pain to her extremities. She denies any swelling to her extremities. She denies fevers, weakness, difficulty walking, loss of bladder control, loss of bowel control, history of cancer, history of IV drug use, falls, rashes, abdominal pain, extremity pain, extremity swelling.    The history is provided by the patient and medical records. No language interpreter was used.  Back Pain   Associated symptoms include numbness. Pertinent negatives include no chest pain, no fever, no headaches, no abdominal pain, no dysuria and no weakness.    Past Medical History:  Diagnosis Date  . Automatic implantable cardioverter-defibrillator in situ   . CAD (coronary artery disease)    a. Inf-post MI 2008 s/p  BMS to large marginal of Cx.   Marland Kitchen HYPERLIPIDEMIA-MIXED 02/07/2009  . HYPERTENSION, BENIGN 08/22/2009  . PVD (peripheral vascular disease) ([place])    a. Evaluated by Dr. [name] Anon 08/2012.  Marland Kitchen SYSTOLIC HEART FAILURE, CHRONIC 02/07/2009   a. EF 30% 2010, 29% by MRI 08/2012. b. s/p prophylactic Medtronic ICD implantation 10/2012.  Marland Kitchen TIA (transient ischemic attack) 05/18/2014  . Tobacco abuse   . VENTRICULAR TACHYCARDIA 05/21/2010    Patient Active Problem List   Diagnosis Date Noted  . COPD exacerbation ([place])   . CAP (community acquired pneumonia) 05/17/2016  . Essential hypertension   . COPD with acute exacerbation ([place]) 12/02/2014  . Systolic heart failure ([place]) 12/02/2014  . Dyspnea 12/02/2014  . Alcohol use ([place]) 12/02/2014  . Elevated hemoglobin ([place]) 12/02/2014  . Acute bronchitis 12/02/2014  . Acute respiratory failure with hypoxia ([place])   . Malnutrition of moderate degree ([place]) 05/19/2014  . Right facial numbness 05/18/2014  . Weakness of right lower extremity 05/18/2014  . TIA (transient ischemic attack) 05/18/2014  . 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  . Smoker 08/11/2012  . PVD (peripheral vascular disease) ([place]) 08/11/2012  . VENTRICULAR TACHYCARDIA 05/21/2010  . BEN HTN HEART DISEASE WITHOUT HEART FAIL 08/28/2009  . HYPERTENSION, BENIGN 08/22/2009  . CHEST PAIN-PRECORDIAL 07/18/2009  . CARDIOVASCULAR FUNCTION STUDY, ABNORMAL 07/18/2009  . Elevated lipids 02/07/2009  . CAD, NATIVE VESSEL 02/07/2009  . Congestive  dilated cardiomyopathy ([place]) 02/07/2009  . SYSTOLIC HEART FAILURE, CHRONIC 02/07/2009    Past Surgical History:  Procedure Laterality Date  . CARDIAC DEFIBRILLATOR PLACEMENT  10/2012  . CESAREAN SECTION  1977; 1982  . CYSTOSCOPY W/ STONE MANIPULATION  1990's  . IMPLANTABLE CARDIOVERTER DEFIBRILLATOR IMPLANT N/A 10/21/2012   Procedure: IMPLANTABLE CARDIOVERTER DEFIBRILLATOR IMPLANT;  Surgeon: Evans Lance, MD;  Location: [facility] CATH LAB;   Service: Cardiovascular;  Laterality: N/A;    OB History    No data available       Home Medications    Prior to Admission medications   Medication Sig Start Date End Date Taking? Authorizing Provider  acetaminophen (TYLENOL) 500 MG tablet Take 500 mg by mouth every 6 (six) hours as needed for headache.     [provider]  aspirin 81 MG tablet Take 81 mg by mouth daily.    [provider]  budesonide-formoterol (SYMBICORT) 160-4.5 MCG/ACT inhaler Inhale 2 puffs into the lungs 2 (two) times daily. 05/20/16   Eugenie Filler, MD  carvedilol (COREG) 12.5 MG tablet TAKE 1 TABLET (12.5 MG TOTAL) BY MOUTH 2 (TWO) TIMES DAILY WITH A MEAL. 02/24/17   Burtis Junes, NP  feeding supplement, ENSURE COMPLETE, (ENSURE COMPLETE) LIQD Take 237 mLs by mouth daily. 05/19/14   Mikhail, Velta Addison, DO  fluticasone (FLONASE) 50 MCG/ACT nasal spray Place 2 sprays into both nostrils as needed for allergies or rhinitis.    [provider]  losartan (COZAAR) 100 MG tablet Take 1 tablet (100 mg total) by mouth daily. 02/24/17   Burtis Junes, NP  nitroGLYCERIN (NITROSTAT) 0.4 MG SL tablet Place 1 tablet (0.4 mg total) under the tongue every 5 (five) minutes as needed for chest pain (up to 3 doses). 06/20/14   [name], MD  PROAIR HFA 108 [phone] Base) MCG/ACT inhaler Inhale 2 puffs into the lungs every 6 (six) hours as needed. 2 puffs 3 times daily x 5 days then every 6 hours as needed. Shortness of breath or wheezing Patient taking differently: Inhale 2 puffs into the lungs every 6 (six) hours as needed for wheezing.  05/20/16   Eugenie Filler, MD  sacubitril-valsartan (ENTRESTO) 97-103 MG Take 1 tablet by mouth 2 (two) times daily. 02/24/17   Burtis Junes, NP  traMADol (ULTRAM) 50 MG tablet Take 1 tablet (50 mg total) by mouth every 6 (six) hours as needed. 03/22/17   Malvin Johns, MD    Family History Family History  Problem Relation Age of Onset  . Cancer Mother         cervical  . Heart attack Father   . Asthma Brother   . Other Brother        Pacemaker    Social History Social History  Substance Use Topics  . Smoking status: Current Every Day Smoker    Packs/day: 0.50    Years: 40.00    Types: Cigarettes  . Smokeless tobacco: Never Used  . Alcohol use 12.0 oz/week    20 Cans of beer per week     Comment: 05/18/2014 "2, 40's qod"     Allergies   Clopidogrel bisulfate   Review of Systems Review of Systems  Constitutional: Negative for chills and fever.  HENT: Negative for congestion and sore throat.   Eyes: Negative for visual disturbance.  Respiratory: Negative for cough and shortness of breath.   Cardiovascular: Negative for chest pain.  Gastrointestinal: Negative for abdominal pain, diarrhea, nausea and vomiting.  Genitourinary: Negative for difficulty urinating, dysuria, flank pain and frequency.  Musculoskeletal: Positive for back pain (resolved ). Negative for arthralgias, gait problem, joint swelling and neck pain.  Skin: Negative for rash and wound.  Neurological: Positive for numbness. Negative for dizziness, syncope, weakness, light-headedness and headaches.     Physical Exam Updated Vital Signs BP (!) 169/95   Pulse 61   Temp 97.6 F (36.4 C) (Oral)   Resp 16   SpO2 97%   Physical Exam  Constitutional: She is oriented to person, place, and time. She appears well-developed and well-nourished. No distress.  HENT:  Head: Normocephalic and atraumatic.  Mouth/Throat: Oropharynx is clear and moist.  Eyes: Pupils are equal, round, and reactive to light. Conjunctivae and EOM are normal. Right eye exhibits no discharge. Left eye exhibits no discharge.  Neck: Neck supple. No JVD present. No tracheal deviation present.  Cardiovascular: Normal rate, regular rhythm, normal heart sounds and intact distal pulses.  Exam reveals no gallop and no friction rub.   No murmur heard. Bilateral radial, and posterior tibialis pulses are  intact and equal.   Pulmonary/Chest: Effort normal and breath sounds normal. No stridor. No respiratory distress. She has no wheezes. She has no rales.  Abdominal: Soft. There is no tenderness. There is no guarding.  Musculoskeletal: Normal range of motion. She exhibits no edema, tenderness or deformity.  No lower extremity edema or tenderness. Good strength with plantar and dorsiflexion bilaterally. No back erythema, ecchymosis,  edema or tenderness.  Lymphadenopathy:    She has no cervical adenopathy.  Neurological: She is alert and oriented to person, place, and time. She displays normal reflexes. No cranial nerve deficit or sensory deficit. She exhibits normal muscle tone. Coordination normal.  Alert and oriented 3. Cranial nerves are intact. Speech is clear and coherent. No pronator drift. Finger to nose intact bilaterally. No foot drop. Normal gait. Strength and sensation is intact her bilateral upper and lower extremities. Bilateral patellar DTRs are intact.  Skin: Skin is warm and dry. Capillary refill takes less than 2 seconds. No rash noted. She is not diaphoretic. No erythema. No pallor.  Psychiatric: She has a normal mood and affect. Her behavior is normal.  Nursing note and vitals reviewed.    ED Treatments / Results  Labs (all labs ordered are listed, but only abnormal results are displayed) Labs Reviewed - No data to display  EKG  EKG Interpretation None       Radiology No results found.  Procedures Procedures (including critical care time)  Medications Ordered in ED Medications - No data to display   Initial Impression / Assessment and Plan / ED Course  I have reviewed the triage vital signs and the nursing notes.  Pertinent labs & imaging results that were available during my care of the patient were reviewed by me and considered in my medical decision making (see chart for details).    This is a 61 y.o. Female with a history of PVD, CAD, and TIA, who  presents to the ED complaining of left leg numbness and left wrist numbness. She reports she began having numbness to her left leg from just below her knee to her ankle yesterday. She reports the numbness surrounded her leg and was not worse on one side or the other and was not only on one side of her leg. It did not extend into her foot or toes. She denies any trouble walking or pain to her leg. She reports her symptoms have  now resolved. She reports earlier today she also had numbness surrounding her left wrist earlier today that is now resolved. It did not extend into her hand or fingers and she had no pain. She reports this is also now resolved. Patient also tells me she had a brief period of some back pain that lasted a few seconds to her left lower back extending down into her left buttocks. She reports this is also now resolved. She currently feels back to normal. She's had no pain to her extremities. She denies any swelling to her extremities. On exam patient is afebrile nontoxic appearing. Exam is reassuring. She is neurovascularly intact. Good equal pulses to extremities. No extremity edema or tenderness. She underwent a normal gait. No foot drop. No back tenderness to palpation. She is not currently having any of these symptoms. I have low suspicion for DVT or radicular pain based on patient's history and exam. I'm uncertain the cause of her symptoms. They have currently resolved. We'll discharge with close follow-up by primary care. I encouraged her to continue taking her aspirin as directed. If her symptoms return, worsen or change or she has new concerns she needs to return to the emergency department immediately. I advised the patient to follow-up with their primary care provider this week. I advised the patient to return to the emergency department with new or worsening symptoms or new concerns. The patient verbalized understanding and agreement with plan.   This patient was discussed with Dr.  Jeneen Rinks who agrees with assessment and plan.   Final Clinical Impressions(s) / ED Diagnoses   Final diagnoses:  Paresthesia    New Prescriptions New Prescriptions   No medications on file     Sharmaine Base 05/03/17 2122    Tanna Furry, MD 05/13/17 [phone]

## 2017-05-03 NOTE — ED Triage Notes (Signed)
Onset yesterday left lower leg numbness, then left side of back started hurting, then left side of face started hurting.  No facial droop, hand grips are equal.  Pt reports no gait disturbances.

## 2017-05-07 DIAGNOSIS — I2510 Atherosclerotic heart disease of native coronary artery without angina pectoris: Secondary | ICD-10-CM | POA: Diagnosis not present

## 2017-05-07 DIAGNOSIS — M25571 Pain in right ankle and joints of right foot: Secondary | ICD-10-CM | POA: Diagnosis not present

## 2017-05-07 DIAGNOSIS — Z95810 Presence of automatic (implantable) cardiac defibrillator: Secondary | ICD-10-CM | POA: Diagnosis not present

## 2017-05-07 DIAGNOSIS — I10 Essential (primary) hypertension: Secondary | ICD-10-CM | POA: Diagnosis not present

## 2017-05-07 DIAGNOSIS — M25572 Pain in left ankle and joints of left foot: Secondary | ICD-10-CM | POA: Diagnosis not present

## 2017-05-20 DIAGNOSIS — M25572 Pain in left ankle and joints of left foot: Secondary | ICD-10-CM | POA: Diagnosis not present

## 2017-06-16 ENCOUNTER — Telehealth: Payer: Self-pay | Admitting: Cardiology

## 2017-06-16 ENCOUNTER — Ambulatory Visit (INDEPENDENT_AMBULATORY_CARE_PROVIDER_SITE_OTHER): Payer: Medicare Other | Admitting: *Deleted

## 2017-06-16 DIAGNOSIS — I255 Ischemic cardiomyopathy: Secondary | ICD-10-CM

## 2017-06-16 NOTE — Telephone Encounter (Signed)
Spoke with pt and reminded pt of remote transmission that is due today. Pt verbalized understanding.   

## 2017-06-17 NOTE — Progress Notes (Signed)
Remote ICD transmission.   

## 2017-06-18 LAB — CUP PACEART REMOTE DEVICE CHECK
Brady Statistic RV Percent Paced: 0.03 %
Date Time Interrogation Session: 20181008181739
HighPow Impedance: 78 Ohm
Implantable Lead Implant Date: 20140212
Implantable Lead Location: 753860
Lead Channel Impedance Value: 532 Ohm
Lead Channel Pacing Threshold Pulse Width: 0.4 ms
Lead Channel Sensing Intrinsic Amplitude: 9.625 mV
MDC IDC MSMT BATTERY REMAINING LONGEVITY: 97 mo
MDC IDC MSMT BATTERY VOLTAGE: 3.01 V
MDC IDC MSMT LEADCHNL RV IMPEDANCE VALUE: 456 Ohm
MDC IDC MSMT LEADCHNL RV PACING THRESHOLD AMPLITUDE: 0.5 V
MDC IDC MSMT LEADCHNL RV SENSING INTR AMPL: 9.625 mV
MDC IDC PG IMPLANT DT: 20140212
MDC IDC SET LEADCHNL RV PACING AMPLITUDE: 2.5 V
MDC IDC SET LEADCHNL RV PACING PULSEWIDTH: 0.4 ms
MDC IDC SET LEADCHNL RV SENSING SENSITIVITY: 0.3 mV

## 2017-06-20 ENCOUNTER — Encounter: Payer: Self-pay | Admitting: Cardiology

## 2017-09-15 ENCOUNTER — Ambulatory Visit (INDEPENDENT_AMBULATORY_CARE_PROVIDER_SITE_OTHER): Payer: Medicare Other | Admitting: *Deleted

## 2017-09-15 DIAGNOSIS — I255 Ischemic cardiomyopathy: Secondary | ICD-10-CM | POA: Diagnosis not present

## 2017-09-15 NOTE — Progress Notes (Signed)
Remote ICD transmission.   

## 2017-09-16 ENCOUNTER — Encounter: Payer: Self-pay | Admitting: Cardiology

## 2017-09-16 LAB — CUP PACEART REMOTE DEVICE CHECK
Battery Remaining Longevity: 94 mo
Battery Voltage: 3.01 V
Brady Statistic RV Percent Paced: 0.02 %
HIGH POWER IMPEDANCE MEASURED VALUE: 84 Ohm
Implantable Lead Model: 6935
Lead Channel Impedance Value: 494 Ohm
Lead Channel Impedance Value: 513 Ohm
Lead Channel Sensing Intrinsic Amplitude: 12 mV
Lead Channel Sensing Intrinsic Amplitude: 12 mV
Lead Channel Setting Pacing Pulse Width: 0.4 ms
Lead Channel Setting Sensing Sensitivity: 0.3 mV
MDC IDC LEAD IMPLANT DT: 20140212
MDC IDC LEAD LOCATION: 753860
MDC IDC MSMT LEADCHNL RV PACING THRESHOLD AMPLITUDE: 0.5 V
MDC IDC MSMT LEADCHNL RV PACING THRESHOLD PULSEWIDTH: 0.4 ms
MDC IDC PG IMPLANT DT: 20140212
MDC IDC SESS DTM: 20190107175202
MDC IDC SET LEADCHNL RV PACING AMPLITUDE: 2.5 V

## 2017-09-23 ENCOUNTER — Encounter: Payer: Self-pay | Admitting: Cardiovascular Disease

## 2017-10-06 NOTE — Progress Notes (Deleted)
CARDIOLOGY OFFICE NOTE  Date:  10/06/2017    [name] Date of Birth: [birth year] Medical Record #[id number]  PCP:  Vicenta Aly, [place]  Cardiologist:  Oshen Wlodarczyk/Taylor    No chief complaint on file.   History of Present Illness: [name] is a 62 y.o. female who presents today for a follow up visit.     She has a history of h/o CAD with inferoposterior MI in 2008, VT, PAD, COPD, ischemic CM, chronic systolic heart failure, s/p ICD implant. She has ongoing tobacco abuse.   EF previously down to 15% but improved by her Myoview 7/16 with EF 50%.  Smoking as little as 4/day. Compliant with meds Remote transmission reviewed and no PAF/VT   Past Medical History:  Diagnosis Date  . Automatic implantable cardioverter-defibrillator in situ   . CAD (coronary artery disease)    a. Inf-post MI 2008 s/p BMS to large marginal of Cx.   Marland Kitchen HYPERLIPIDEMIA-MIXED 02/07/2009  . HYPERTENSION, BENIGN 08/22/2009  . PVD (peripheral vascular disease) ([place])    a. Evaluated by Dr. [name] Anon 08/2012.  Marland Kitchen SYSTOLIC HEART FAILURE, CHRONIC 02/07/2009   a. EF 30% 2010, 29% by MRI 08/2012. b. s/p prophylactic Medtronic ICD implantation 10/2012.  Marland Kitchen TIA (transient ischemic attack) 05/18/2014  . Tobacco abuse   . VENTRICULAR TACHYCARDIA 05/21/2010    Past Surgical History:  Procedure Laterality Date  . CARDIAC DEFIBRILLATOR PLACEMENT  10/2012  . CESAREAN SECTION  1977; 1982  . CYSTOSCOPY W/ STONE MANIPULATION  1990's  . IMPLANTABLE CARDIOVERTER DEFIBRILLATOR IMPLANT N/A 10/21/2012   Procedure: IMPLANTABLE CARDIOVERTER DEFIBRILLATOR IMPLANT;  Surgeon: Evans Lance, MD;  Location[facility] C[facility] LAB;  Service: Cardiovascular;  Laterality: N/A;     Medications: Current Outpatient Medications  Medication Sig Dispense Refill  . acetaminophen (TYLENOL) 500 MG tablet Take 500 mg by mouth every 6 (six) hours as needed for headache.     Marland Kitchen aspirin 81 MG tablet Take 81 mg by mouth daily.    .  budesonide-formoterol (SYMBICORT) 160-4.5 MCG/ACT inhaler Inhale 2 puffs into the lungs 2 (two) times daily. 1 Inhaler 6  . carvedilol (COREG) 12.5 MG tablet TAKE 1 TABLET (12.5 MG TOTAL) BY MOUTH 2 (TWO) TIMES DAILY WITH A MEAL. 180 tablet 3  . feeding supplement, ENSURE COMPLETE, (ENSURE COMPLETE) LIQD Take 237 mLs by mouth daily.    . fluticasone (FLONASE) 50 MCG/ACT nasal spray Place 2 sprays into both nostrils as needed for allergies or rhinitis.    Marland Kitchen losartan (COZAAR) 100 MG tablet Take 1 tablet (100 mg total) by mouth daily. 90 tablet 3  . nitroGLYCERIN (NITROSTAT) 0.4 MG SL tablet Place 1 tablet (0.4 mg total) under the tongue every 5 (five) minutes as needed for chest pain (up to 3 doses). 25 tablet 4  . PROAIR HFA 108 (90 Base) MCG/ACT inhaler Inhale 2 puffs into the lungs every 6 (six) hours as needed. 2 puffs 3 times daily x 5 days then every 6 hours as needed. Shortness of breath or wheezing (Patient taking differently: Inhale 2 puffs into the lungs every 6 (six) hours as needed for wheezing. ) 18 g 3  . sacubitril-valsartan (ENTRESTO) 97-103 MG Take 1 tablet by mouth 2 (two) times daily. 180 tablet 3  . traMADol (ULTRAM) 50 MG tablet Take 1 tablet (50 mg total) by mouth every 6 (six) hours as needed. 15 tablet 0   No current facility-administered medications for this visit.     Allergies: Allergies  Allergen Reactions  . Clopidogrel Bisulfate Rash    Social History: The patient  reports that she has been smoking cigarettes.  She has a 20.00 pack-year smoking history. she has never used smokeless tobacco. She reports that she drinks about 12.0 oz of alcohol per week. She reports that she does not use drugs.   Family History: The patient's family history includes Asthma in her brother; Cancer in her mother; Heart attack in her father; Other in her brother.   Review of Systems: Please see the history of present illness.   Otherwise, the review of systems is positive for none.    All other systems are reviewed and negative.   Physical Exam: VS:  There were no vitals taken for this visit. Marland Kitchen  BMI There is no height or weight on file to calculate BMI.  Wt Readings from Last 3 Encounters:  02/24/17 146 lb 6.4 oz (66.4 kg)  12/16/16 178 lb 12.8 oz (81.1 kg)  05/19/16 136 lb 12.8 oz (62.1 kg)   Affect appropriate Healthy:  appears stated age HEENT: normal Neck supple with no adenopathy JVP normal no bruits no thyromegaly Lungs clear with no wheezing and good diaphragmatic motion Heart:  S1/S2 no murmur, no rub, gallop or click PMI normal Abdomen: benighn, BS positve, no tenderness, no AAA no bruit.  No HSM or HJR Distal pulses intact with no bruits No edema Neuro non-focal Skin warm and dry No muscular weakness   LABORATORY DATA:  EKG:  03/22/17  NSR, prior inferior infarct, lateral T wave changes and PVC.  Lab Results  Component Value Date   WBC 6.6 05/20/2016   HGB 14.6 05/20/2016   HCT 43.7 05/20/2016   PLT 118 (L) 05/20/2016   GLUCOSE 128 (H) 05/20/2016   CHOL 136 05/19/2014   TRIG 57 05/19/2014   HDL 65 05/19/2014   LDLCALC 60 05/19/2014   ALT 26 05/17/2016   AST 48 (H) 05/17/2016   NA 140 05/20/2016   K 3.7 05/20/2016   CL 106 05/20/2016   CREATININE 0.57 05/20/2016   BUN <5 (L) 05/20/2016   CO2 26 05/20/2016   TSH 3.301 Test methodology is 3rd generation TSH 03/05/2010   INR 0.93 05/18/2014   HGBA1C 5.4 05/18/2014   MICROALBUR 0.57 05/16/2009     BNP (last 3 results) No results for input(s): BNP in the last 8760 hours.  ProBNP (last 3 results) No results for input(s): PROBNP in the last 8760 hours.   Other Studies Reviewed Today:  Myoview Study Highlights 03/2015    Nuclear stress EF: 50%.  There was no ST segment deviation noted during stress.  This is a low risk study.  The left ventricular ejection fraction is mildly decreased (45-54%).   Small area of apical and lateral wall infarct no ischemia EF 50%     Echo  Study Conclusions 04/2015  - Left ventricle: The cavity size was normal. Wall thickness was   normal. Systolic function was normal. The estimated ejection   fraction was in the range of 50% to 55%. There is akinesis of the   basal-midinferolateral and inferior myocardium. Doppler   parameters are consistent with abnormal left ventricular   relaxation (grade 1 diastolic dysfunction). - Aortic valve: There was trivial regurgitation.  Impressions:  - Inferior and inferior lateral akinesis with overall low normal LV   function; grade 1 diastolic dysfunction; trace AI and MR.  Assessment/Plan:  1. Ischemic CM with chronic systolic HF - she appears to be euvolemic -  her EF improved by last echo 50-55% -would continue with current medical RX   2. CAD: S/p inferoposterior MI with PCI in 2008.   Myoview from 03/2015. No chest pain continue medical Rx   3. PAD: Bilateral SFA occlusions on arterial duplex in 10/15.  No significant claudication.   Needs to stop smoking.   4. Smoking: Down to 4 cigs/day.   Strongly recommend cessation. CT 03/22/17 nodules needs f/u July 2019   5. Underlying ICD - followed by EP - no shocks noted. Continue remote transmission  Jenkins Rouge

## 2017-10-08 ENCOUNTER — Ambulatory Visit: Payer: Medicare Other | Admitting: Cardiovascular Disease

## 2017-10-27 NOTE — Progress Notes (Signed)
CARDIOLOGY OFFICE NOTE  Date:  11/03/2017    [name] Date of Birth: 1955/09/28 Medical Record #[id number]  PCP:  Vicenta Aly, Clarissa  Cardiologist:  Alyviah Crandle/Taylor    No chief complaint on file.   History of Present Illness:  62 y.o. CAD IMI 2008 with PCI , Ischemic DCM with AICD EF improved by last echo 50% 2016 Smoker with COPD Sees Dr Lovena Le for her device f/u and remote transmission reviewed no discharges normal function  PVD with bilateral SFA occlusions. She needs f/u with Dr [name] Anon who has not seen her since 2016 At that time no real Claudication and ABI's around .80  She still denies any calf or leg pain with ambulation Still smoking counseled on cessation less than a ppd. No recent CXR    Past Medical History:  Diagnosis Date  . Automatic implantable cardioverter-defibrillator in situ   . CAD (coronary artery disease)    a. Inf-post MI 2008 s/p BMS to large marginal of Cx.   Marland Kitchen HYPERLIPIDEMIA-MIXED 02/07/2009  . HYPERTENSION, BENIGN 08/22/2009  . PVD (peripheral vascular disease) ([place])    a. Evaluated by Dr. [name] Anon 08/2012.  Marland Kitchen SYSTOLIC HEART FAILURE, CHRONIC 02/07/2009   a. EF 30% 2010, 29% by MRI 08/2012. b. s/p prophylactic Medtronic ICD implantation 10/2012.  Marland Kitchen TIA (transient ischemic attack) 05/18/2014  . Tobacco abuse   . VENTRICULAR TACHYCARDIA 05/21/2010    Past Surgical History:  Procedure Laterality Date  . CARDIAC DEFIBRILLATOR PLACEMENT  10/2012  . CESAREAN SECTION  1977; 1982  . CYSTOSCOPY W/ STONE MANIPULATION  1990's  . IMPLANTABLE CARDIOVERTER DEFIBRILLATOR IMPLANT N/A 10/21/2012   Procedure: IMPLANTABLE CARDIOVERTER DEFIBRILLATOR IMPLANT;  Surgeon: Evans Lance, MD;  Location: [facility] CATH LAB;  Service: Cardiovascular;  Laterality: N/A;     Medications: Current Outpatient Medications  Medication Sig Dispense Refill  . acetaminophen (TYLENOL) 500 MG tablet Take 500 mg by mouth every 6 (six) hours as needed for headache.     . albuterol  (PROAIR HFA) 108 (90 Base) MCG/ACT inhaler Inhale 2 puffs into the lungs every 6 (six) hours as needed for wheezing or shortness of breath.    Marland Kitchen aspirin 81 MG tablet Take 81 mg by mouth daily.    . budesonide-formoterol (SYMBICORT) 160-4.5 MCG/ACT inhaler Inhale 2 puffs into the lungs 2 (two) times daily. 1 Inhaler 6  . carvedilol (COREG) 12.5 MG tablet TAKE 1 TABLET (12.5 MG TOTAL) BY MOUTH 2 (TWO) TIMES DAILY WITH A MEAL. 180 tablet 3  . feeding supplement, ENSURE COMPLETE, (ENSURE COMPLETE) LIQD Take 237 mLs by mouth daily.    . fluticasone (FLONASE) 50 MCG/ACT nasal spray Place 2 sprays into both nostrils as needed for allergies or rhinitis.    Marland Kitchen nitroGLYCERIN (NITROSTAT) 0.4 MG SL tablet Place 1 tablet (0.4 mg total) under the tongue every 5 (five) minutes as needed for chest pain (up to 3 doses). 25 tablet 4  . sacubitril-valsartan (ENTRESTO) 97-103 MG Take 1 tablet by mouth 2 (two) times daily. 180 tablet 3  . traMADol (ULTRAM) 50 MG tablet Take 1 tablet (50 mg total) by mouth every 6 (six) hours as needed. 15 tablet 0   No current facility-administered medications for this visit.     Allergies: Allergies  Allergen Reactions  . Clopidogrel Bisulfate Rash    Social History: The patient  reports that she has been smoking cigarettes.  She has a 20.00 pack-year smoking history. she has never used smokeless tobacco. She reports that  she drinks about 12.0 oz of alcohol per week. She reports that she does not use drugs.   Family History: The patient's family history includes Asthma in her brother; Cancer in her mother; Heart attack in her father; Other in her brother.   Review of Systems: Please see the history of present illness.   Otherwise, the review of systems is positive for none.   All other systems are reviewed and negative.   Physical Exam: VS:  BP 128/74   Pulse 92   Ht 5' 9.5" (1.765 m)   Wt 153 lb 8 oz (69.6 kg)   SpO2 97%   BMI 22.34 kg/m  .  BMI Body mass index is  22.34 kg/m.  Wt Readings from Last 3 Encounters:  11/03/17 153 lb 8 oz (69.6 kg)  02/24/17 146 lb 6.4 oz (66.4 kg)  12/16/16 178 lb 12.8 oz (81.1 kg)   Affect appropriate Healthy:  appears stated age HEENT: normal Neck supple with no adenopathy JVP normal no bruits no thyromegaly Lungs clear with no wheezing and good diaphragmatic motion Heart:  S1/S2 SEM murmur, no rub, gallop or click PMI normal AICD under left clavicle  Abdomen: benighn, BS positve, no tenderness, no AAA no bruit.  No HSM or HJR Distal pulses decreased below the knees  No edema Neuro non-focal Skin warm and dry No muscular weakness    LABORATORY DATA:  EKG:  03/22/17  NSR, prior inferior infarct, lateral T wave changes and PVC.  Lab Results  Component Value Date   WBC 6.6 05/20/2016   HGB 14.6 05/20/2016   HCT 43.7 05/20/2016   PLT 118 (L) 05/20/2016   GLUCOSE 128 (H) 05/20/2016   CHOL 136 05/19/2014   TRIG 57 05/19/2014   HDL 65 05/19/2014   LDLCALC 60 05/19/2014   ALT 26 05/17/2016   AST 48 (H) 05/17/2016   NA 140 05/20/2016   K 3.7 05/20/2016   CL 106 05/20/2016   CREATININE 0.57 05/20/2016   BUN <5 (L) 05/20/2016   CO2 26 05/20/2016   TSH 3.301 Test methodology is 3rd generation TSH 03/05/2010   INR 0.93 05/18/2014   HGBA1C 5.4 05/18/2014   MICROALBUR 0.57 05/16/2009     BNP (last 3 results) No results for input(s): BNP in the last 8760 hours.  ProBNP (last 3 results) No results for input(s): PROBNP in the last 8760 hours.   Other Studies Reviewed Today:  Myoview Study Highlights 03/2015    Nuclear stress EF: 50%.  There was no ST segment deviation noted during stress.  This is a low risk study.  The left ventricular ejection fraction is mildly decreased (45-54%).   Small area of apical and lateral wall infarct no ischemia EF 50%     Echo Study Conclusions 04/2015  - Left ventricle: The cavity size was normal. Wall thickness was   normal. Systolic function was  normal. The estimated ejection   fraction was in the range of 50% to 55%. There is akinesis of the   basal-midinferolateral and inferior myocardium. Doppler   parameters are consistent with abnormal left ventricular   relaxation (grade 1 diastolic dysfunction). - Aortic valve: There was trivial regurgitation.  Impressions:  - Inferior and inferior lateral akinesis with overall low normal LV   function; grade 1 diastolic dysfunction; trace AI and MR.  Assessment/Plan:  1. Ischemic CM  Will update echo continue enstresto and coreg   2. CAD: S/p inferoposterior MI with PCI in 2008.   Myoview from  03/2015. No chest pain continue medical Rx   3. PAD: Bilateral SFA occlusions check ABI's refer back to Dr [name] Anon    4. Smoking: counseled on cessation normal exam F/U CXR ordered   5. Underlying ICD - followed by EP - no shocks noted. Continue remote transmission F/u Dr Katherina Mires

## 2017-10-29 ENCOUNTER — Telehealth: Payer: Self-pay

## 2017-10-29 NOTE — Telephone Encounter (Signed)
Tried to call patient about her medications. Patient's number is not in service.

## 2017-11-03 ENCOUNTER — Ambulatory Visit (INDEPENDENT_AMBULATORY_CARE_PROVIDER_SITE_OTHER): Payer: Medicare Other | Admitting: Cardiovascular Disease

## 2017-11-03 ENCOUNTER — Encounter: Payer: Self-pay | Admitting: Cardiovascular Disease

## 2017-11-03 VITALS — BP 128/74 | HR 92 | Ht 69.5 in | Wt 153.5 lb

## 2017-11-03 DIAGNOSIS — I739 Peripheral vascular disease, unspecified: Secondary | ICD-10-CM

## 2017-11-03 DIAGNOSIS — F17200 Nicotine dependence, unspecified, uncomplicated: Secondary | ICD-10-CM

## 2017-11-03 DIAGNOSIS — I255 Ischemic cardiomyopathy: Secondary | ICD-10-CM | POA: Diagnosis not present

## 2017-11-03 MED ORDER — NITROGLYCERIN 0.4 MG SL SUBL: 0.4000 mg | SUBLINGUAL_TABLET | SUBLINGUAL | 4 refills | Status: AC | PRN

## 2017-11-03 NOTE — Patient Instructions (Addendum)
Medication Instructions:  Your physician recommends that you continue on your current medications as directed. Please refer to the Current Medication list given to you today.  Labwork: NONE  Testing/Procedures: Your physician has requested that you have an ankle brachial index (ABI). During this test an ultrasound and blood pressure cuff are used to evaluate the arteries that supply the arms and legs with blood. Allow thirty minutes for this exam. There are no restrictions or special instructions.  A chest x-ray takes a picture of the organs and structures inside the chest, including the heart, lungs, and blood vessels. This test can show several things, including, whether the heart is enlarges; whether fluid is building up in the lungs; and whether pacemaker / defibrillator leads are still in place. Please go to [facility].   Follow-Up: Your physician wants you to follow-up in: 12 months with Dr. Johnsie Cancel. You will receive a reminder letter in the mail two months in advance. If you don't receive a letter, please call our office to schedule the follow-up appointment.   If you need a refill on your cardiac medications before your next appointment, please call your pharmacy.

## 2017-11-05 ENCOUNTER — Ambulatory Visit
Admission: RE | Admit: 2017-11-05 | Discharge: 2017-11-05 | Disposition: A | Discharge status: Home / Self Care | Payer: Medicare Other | Source: Ambulatory Visit | Attending: Cardiovascular Disease | Admitting: Cardiovascular Disease

## 2017-11-05 DIAGNOSIS — Z87891 Personal history of nicotine dependence: Secondary | ICD-10-CM | POA: Diagnosis not present

## 2017-11-05 DIAGNOSIS — F17200 Nicotine dependence, unspecified, uncomplicated: Secondary | ICD-10-CM

## 2017-11-19 ENCOUNTER — Ambulatory Visit (HOSPITAL_COMMUNITY)
Admission: RE | Admit: 2017-11-19 | Discharge: 2017-11-19 | Disposition: A | Discharge status: Home / Self Care | Payer: Medicare Other | Source: Ambulatory Visit | Attending: Cardiovascular Disease | Admitting: Cardiovascular Disease

## 2017-11-19 DIAGNOSIS — I739 Peripheral vascular disease, unspecified: Secondary | ICD-10-CM | POA: Diagnosis not present

## 2017-11-19 DIAGNOSIS — F17200 Nicotine dependence, unspecified, uncomplicated: Secondary | ICD-10-CM | POA: Diagnosis not present

## 2017-11-24 ENCOUNTER — Telehealth: Payer: Self-pay

## 2017-11-24 DIAGNOSIS — I739 Peripheral vascular disease, unspecified: Secondary | ICD-10-CM

## 2017-11-24 NOTE — Telephone Encounter (Signed)
-----   Message from [name], MD sent at 11/20/2017  9:50 PM EDT ----- ABI"s mildly decreased f/u in a year

## 2017-11-24 NOTE — Telephone Encounter (Signed)
Patient aware of results. Patient will come in next March for a repeat study. Order is in already.

## 2017-12-15 ENCOUNTER — Telehealth: Payer: Self-pay | Admitting: Cardiology

## 2017-12-15 ENCOUNTER — Ambulatory Visit (INDEPENDENT_AMBULATORY_CARE_PROVIDER_SITE_OTHER): Payer: Medicare Other | Admitting: *Deleted

## 2017-12-15 DIAGNOSIS — I255 Ischemic cardiomyopathy: Secondary | ICD-10-CM | POA: Diagnosis not present

## 2017-12-15 NOTE — Telephone Encounter (Signed)
Spoke with pt and reminded pt of remote transmission that is due today. Pt verbalized understanding.   

## 2017-12-16 NOTE — Progress Notes (Signed)
Remote ICD transmission.   

## 2017-12-18 ENCOUNTER — Encounter: Payer: Self-pay | Admitting: Cardiology

## 2017-12-30 LAB — CUP PACEART REMOTE DEVICE CHECK
Battery Remaining Longevity: 91 mo
Battery Voltage: 3.01 V
Brady Statistic RV Percent Paced: 0.02 %
Date Time Interrogation Session: 20190408175117
HighPow Impedance: 79 Ohm
Implantable Lead Implant Date: 20140212
Implantable Lead Location: 753860
Implantable Lead Model: 6935
Implantable Pulse Generator Implant Date: 20140212
Lead Channel Impedance Value: 513 Ohm
Lead Channel Pacing Threshold Amplitude: 0.5 V
Lead Channel Pacing Threshold Pulse Width: 0.4 ms
Lead Channel Setting Pacing Amplitude: 2.5 V
Lead Channel Setting Pacing Pulse Width: 0.4 ms
Lead Channel Setting Sensing Sensitivity: 0.3 mV
MDC IDC MSMT LEADCHNL RV IMPEDANCE VALUE: 456 Ohm
MDC IDC MSMT LEADCHNL RV SENSING INTR AMPL: 11.375 mV
MDC IDC MSMT LEADCHNL RV SENSING INTR AMPL: 11.375 mV

## 2018-02-18 ENCOUNTER — Ambulatory Visit (INDEPENDENT_AMBULATORY_CARE_PROVIDER_SITE_OTHER): Payer: Medicare Other | Admitting: Nurse Practitioner

## 2018-02-18 ENCOUNTER — Encounter: Payer: Self-pay | Admitting: Nurse Practitioner

## 2018-02-18 ENCOUNTER — Encounter (INDEPENDENT_AMBULATORY_CARE_PROVIDER_SITE_OTHER): Payer: Self-pay

## 2018-02-18 DIAGNOSIS — IMO0001 Reserved for inherently not codable concepts without codable children: Secondary | ICD-10-CM

## 2018-02-18 DIAGNOSIS — R319 Hematuria, unspecified: Secondary | ICD-10-CM

## 2018-02-18 DIAGNOSIS — E7800 Pure hypercholesterolemia, unspecified: Secondary | ICD-10-CM

## 2018-02-18 DIAGNOSIS — I255 Ischemic cardiomyopathy: Secondary | ICD-10-CM

## 2018-02-18 DIAGNOSIS — I5022 Chronic systolic (congestive) heart failure: Secondary | ICD-10-CM

## 2018-02-18 DIAGNOSIS — F17200 Nicotine dependence, unspecified, uncomplicated: Secondary | ICD-10-CM

## 2018-02-18 LAB — URINALYSIS
Bilirubin, UA: NEGATIVE
Glucose, UA: NEGATIVE
Ketones, UA: NEGATIVE
Nitrite, UA: NEGATIVE
Protein, UA: NEGATIVE
RBC, UA: NEGATIVE
Specific Gravity, UA: 1.01 (ref 1.005–1.030)
Urobilinogen, Ur: 0.2 mg/dL (ref 0.2–1.0)
pH, UA: 5.5 (ref 5.0–7.5)

## 2018-02-18 NOTE — Progress Notes (Signed)
CARDIOLOGY OFFICE NOTE  Date:  02/18/2018    [name] Date of Birth: 1956/01/29 Medical Record #[id number]  PCP:  Vicenta Aly, FNP  Cardiologist:  Gillian Shields    Chief Complaint  Patient presents with  . Cardiomyopathy    Follow up visit - seen for Dr. Johnsie Cancel    History of Present Illness: [name] is a 62 y.o. female who presents today for a follow up/work in visit. Seen for Dr. Johnsie Cancel.   She has a history of inferior MI with PCI back in 2008, ischemic CM, underlying ICD, ongoing tobacco abuse, COPD, PAD with bilateral SFA occlusions, VT and HLD.   EF previously down to 15% but improved by her Myoview 7/16 with EF 50%.  Last seen by Dr. Johnsie Cancel back in February - continuing to smoke.   Comes in today. Here alone. She tells me she is here because of blood in her urine of at least one month duration. Has not seen her PCP. Not on blood thinner other than aspirin. No cardiac complaints. No chest pain or shortness of breath. Still smoking. She says she did not know who to call.   Past Medical History:  Diagnosis Date  . Automatic implantable cardioverter-defibrillator in situ   . CAD (coronary artery disease)    a. Inf-post MI 2008 s/p BMS to large marginal of Cx.   Marland Kitchen HYPERLIPIDEMIA-MIXED 02/07/2009  . HYPERTENSION, BENIGN 08/22/2009  . PVD (peripheral vascular disease) ([place])    a. Evaluated by Dr. [name] Anon 08/2012.  Marland Kitchen SYSTOLIC HEART FAILURE, CHRONIC 02/07/2009   a. EF 30% 2010, 29% by MRI 08/2012. b. s/p prophylactic Medtronic ICD implantation 10/2012.  Marland Kitchen TIA (transient ischemic attack) 05/18/2014  . Tobacco abuse   . VENTRICULAR TACHYCARDIA 05/21/2010    Past Surgical History:  Procedure Laterality Date  . CARDIAC DEFIBRILLATOR PLACEMENT  10/2012  . CESAREAN SECTION  1977; 1982  . CYSTOSCOPY W/ STONE MANIPULATION  1990's  . IMPLANTABLE CARDIOVERTER DEFIBRILLATOR IMPLANT N/A 10/21/2012   Procedure: IMPLANTABLE CARDIOVERTER DEFIBRILLATOR IMPLANT;   Surgeon: Evans Lance, MD;  Location: [facility] CATH LAB;  Service: Cardiovascular;  Laterality: N/A;     Medications: No outpatient medications have been marked as taking for the 02/18/18 encounter (Office Visit) with Burtis Junes, NP.     Allergies: Allergies  Allergen Reactions  . Clopidogrel Bisulfate Rash    Social History: The patient  reports that she has been smoking cigarettes.  She has a 20.00 pack-year smoking history. She has never used smokeless tobacco. She reports that she drinks about 12.0 oz of alcohol per week. She reports that she does not use drugs.   Family History: The patient's family history includes Asthma in her brother; Cancer in her mother; Heart attack in her father; Other in her brother.   Review of Systems: Please see the history of present illness.   Otherwise, the review of systems is positive for none.   All other systems are reviewed and negative.   Physical Exam: VS:  There were no vitals taken for this visit. Marland Kitchen  BMI There is no height or weight on file to calculate BMI.  Wt Readings from Last 3 Encounters:  11/03/17 153 lb 8 oz (69.6 kg)  02/24/17 146 lb 6.4 oz (66.4 kg)  12/16/16 178 lb 12.8 oz (81.1 kg)    General: Pleasant. Alert. Smells of tobacco.  She was not otherwise examined today.   LABORATORY DATA:  EKG:  EKG is  not ordered today.  Lab Results  Component Value Date   WBC 7.8 03/22/2017   HGB 15.5 (H) 03/22/2017   HCT 45.9 03/22/2017   PLT 87 (L) 03/22/2017   GLUCOSE 93 03/22/2017   CHOL 153 02/24/2017   TRIG 61 02/24/2017   HDL 74 02/24/2017   LDLCALC 67 02/24/2017   ALT 40 03/22/2017   AST 70 (H) 03/22/2017   NA 132 (L) 03/22/2017   K 4.5 03/22/2017   CL 104 03/22/2017   CREATININE 0.57 03/22/2017   BUN 7 03/22/2017   CO2 18 (L) 03/22/2017   TSH 3.301 Test methodology is 3rd generation TSH 03/05/2010   INR 0.93 05/18/2014   HGBA1C 5.4 05/18/2014   MICROALBUR 0.57 05/16/2009     BNP (last 3 results) No  results for input(s): BNP in the last 8760 hours.  ProBNP (last 3 results) No results for input(s): PROBNP in the last 8760 hours.   Other Studies Reviewed Today:  Myoview Study Highlights 03/2015    Nuclear stress EF: 50%.  There was no ST segment deviation noted during stress.  This is a low risk study.  The left ventricular ejection fraction is mildly decreased (45-54%).  Small area of apical and lateral wall infarct no ischemia EF 50%     Echo Study Conclusions 04/2015  - Left ventricle: The cavity size was normal. Wall thickness was normal. Systolic function was normal. The estimated ejection fraction was in the range of 50% to 55%. There is akinesis of the basal-midinferolateral and inferior myocardium. Doppler parameters are consistent with abnormal left ventricular relaxation (grade 1 diastolic dysfunction). - Aortic valve: There was trivial regurgitation.  Impressions:  - Inferior and inferior lateral akinesis with overall low normal LV function; grade 1 diastolic dysfunction; trace AI and MR.  Assessment/Plan:  1. Hematuria - will check UA - probably needs urology referral or to see her PCP.   Her cardiac status remains stable. She had no other complaints here today and was only here due to hematuria. Will see back as planned.   Current medicines are reviewed with the patient today.  The patient does not have concerns regarding medicines other than what has been noted above.  The following changes have been made:  See above.  Labs/ tests ordered today include:    Orders Placed This Encounter  Procedures  . Urine Culture  . Urinalysis  . Ambulatory referral to Urology     Disposition:   FU with Dr. Johnsie Cancel as planned. I will see back as planned.   Patient is agreeable to this plan and will call if any problems develop in the interim.   SignedTruitt Merle, NP  02/18/2018 10:14 AM  [facility] [address] [place],   [address] Phone: [phone] Fax: [phone]

## 2018-02-20 LAB — URINE CULTURE

## 2018-02-25 DIAGNOSIS — I10 Essential (primary) hypertension: Secondary | ICD-10-CM | POA: Diagnosis not present

## 2018-02-25 DIAGNOSIS — R319 Hematuria, unspecified: Secondary | ICD-10-CM | POA: Diagnosis not present

## 2018-02-25 DIAGNOSIS — N3001 Acute cystitis with hematuria: Secondary | ICD-10-CM | POA: Diagnosis not present

## 2018-03-16 ENCOUNTER — Ambulatory Visit (INDEPENDENT_AMBULATORY_CARE_PROVIDER_SITE_OTHER): Payer: Medicare Other | Admitting: *Deleted

## 2018-03-16 DIAGNOSIS — I255 Ischemic cardiomyopathy: Secondary | ICD-10-CM | POA: Diagnosis not present

## 2018-03-17 NOTE — Progress Notes (Signed)
Remote ICD transmission.   

## 2018-03-18 DIAGNOSIS — R945 Abnormal results of liver function studies: Secondary | ICD-10-CM | POA: Diagnosis not present

## 2018-03-20 LAB — CUP PACEART REMOTE DEVICE CHECK
Battery Voltage: 3 V
Brady Statistic RV Percent Paced: 0.01 %
Date Time Interrogation Session: 20190708153716
HIGH POWER IMPEDANCE MEASURED VALUE: 75 Ohm
Implantable Lead Model: 6935
Lead Channel Impedance Value: 380 Ohm
Lead Channel Impedance Value: 456 Ohm
Lead Channel Sensing Intrinsic Amplitude: 9.375 mV
Lead Channel Setting Pacing Amplitude: 2.5 V
Lead Channel Setting Sensing Sensitivity: 0.3 mV
MDC IDC LEAD IMPLANT DT: 20140212
MDC IDC LEAD LOCATION: 753860
MDC IDC MSMT BATTERY REMAINING LONGEVITY: 85 mo
MDC IDC MSMT LEADCHNL RV PACING THRESHOLD AMPLITUDE: 0.5 V
MDC IDC MSMT LEADCHNL RV PACING THRESHOLD PULSEWIDTH: 0.4 ms
MDC IDC MSMT LEADCHNL RV SENSING INTR AMPL: 9.375 mV
MDC IDC PG IMPLANT DT: 20140212
MDC IDC SET LEADCHNL RV PACING PULSEWIDTH: 0.4 ms

## 2018-03-23 ENCOUNTER — Other Ambulatory Visit: Payer: Self-pay

## 2018-03-23 ENCOUNTER — Emergency Department (HOSPITAL_COMMUNITY): Payer: Medicare Other

## 2018-03-23 ENCOUNTER — Encounter (HOSPITAL_COMMUNITY): Payer: Self-pay | Admitting: Emergency Medicine

## 2018-03-23 ENCOUNTER — Inpatient Hospital Stay (HOSPITAL_COMMUNITY)
Admission: EM | Admit: 2018-03-23 | Discharge: 2018-03-27 | DRG: 286 | Disposition: A | Discharge status: Home / Self Care | Payer: Medicare Other | Attending: Family Medicine | Admitting: Family Medicine

## 2018-03-23 ENCOUNTER — Inpatient Hospital Stay (HOSPITAL_COMMUNITY): Payer: Medicare Other

## 2018-03-23 DIAGNOSIS — Z7951 Long term (current) use of inhaled steroids: Secondary | ICD-10-CM | POA: Diagnosis not present

## 2018-03-23 DIAGNOSIS — E872 Acidosis: Secondary | ICD-10-CM | POA: Diagnosis present

## 2018-03-23 DIAGNOSIS — Z720 Tobacco use: Secondary | ICD-10-CM | POA: Diagnosis not present

## 2018-03-23 DIAGNOSIS — I252 Old myocardial infarction: Secondary | ICD-10-CM | POA: Diagnosis not present

## 2018-03-23 DIAGNOSIS — I739 Peripheral vascular disease, unspecified: Secondary | ICD-10-CM | POA: Diagnosis present

## 2018-03-23 DIAGNOSIS — I5023 Acute on chronic systolic (congestive) heart failure: Secondary | ICD-10-CM | POA: Diagnosis present

## 2018-03-23 DIAGNOSIS — F17200 Nicotine dependence, unspecified, uncomplicated: Secondary | ICD-10-CM | POA: Diagnosis present

## 2018-03-23 DIAGNOSIS — I5022 Chronic systolic (congestive) heart failure: Secondary | ICD-10-CM

## 2018-03-23 DIAGNOSIS — J441 Chronic obstructive pulmonary disease with (acute) exacerbation: Secondary | ICD-10-CM | POA: Diagnosis present

## 2018-03-23 DIAGNOSIS — R231 Pallor: Secondary | ICD-10-CM | POA: Diagnosis not present

## 2018-03-23 DIAGNOSIS — F1010 Alcohol abuse, uncomplicated: Secondary | ICD-10-CM | POA: Diagnosis present

## 2018-03-23 DIAGNOSIS — I5043 Acute on chronic combined systolic (congestive) and diastolic (congestive) heart failure: Secondary | ICD-10-CM | POA: Diagnosis present

## 2018-03-23 DIAGNOSIS — I509 Heart failure, unspecified: Secondary | ICD-10-CM | POA: Diagnosis not present

## 2018-03-23 DIAGNOSIS — R0602 Shortness of breath: Secondary | ICD-10-CM

## 2018-03-23 DIAGNOSIS — J9811 Atelectasis: Secondary | ICD-10-CM | POA: Diagnosis present

## 2018-03-23 DIAGNOSIS — Z95810 Presence of automatic (implantable) cardiac defibrillator: Secondary | ICD-10-CM | POA: Diagnosis not present

## 2018-03-23 DIAGNOSIS — Z825 Family history of asthma and other chronic lower respiratory diseases: Secondary | ICD-10-CM

## 2018-03-23 DIAGNOSIS — I351 Nonrheumatic aortic (valve) insufficiency: Secondary | ICD-10-CM

## 2018-03-23 DIAGNOSIS — J9601 Acute respiratory failure with hypoxia: Secondary | ICD-10-CM | POA: Diagnosis present

## 2018-03-23 DIAGNOSIS — Z7982 Long term (current) use of aspirin: Secondary | ICD-10-CM

## 2018-03-23 DIAGNOSIS — I110 Hypertensive heart disease with heart failure: Secondary | ICD-10-CM | POA: Diagnosis not present

## 2018-03-23 DIAGNOSIS — I10 Essential (primary) hypertension: Secondary | ICD-10-CM | POA: Diagnosis not present

## 2018-03-23 DIAGNOSIS — I340 Nonrheumatic mitral (valve) insufficiency: Secondary | ICD-10-CM | POA: Diagnosis not present

## 2018-03-23 DIAGNOSIS — R0689 Other abnormalities of breathing: Secondary | ICD-10-CM | POA: Diagnosis not present

## 2018-03-23 DIAGNOSIS — F17210 Nicotine dependence, cigarettes, uncomplicated: Secondary | ICD-10-CM | POA: Diagnosis present

## 2018-03-23 DIAGNOSIS — Z8249 Family history of ischemic heart disease and other diseases of the circulatory system: Secondary | ICD-10-CM | POA: Diagnosis not present

## 2018-03-23 DIAGNOSIS — I959 Hypotension, unspecified: Secondary | ICD-10-CM | POA: Diagnosis not present

## 2018-03-23 DIAGNOSIS — Z7289 Other problems related to lifestyle: Secondary | ICD-10-CM | POA: Diagnosis present

## 2018-03-23 DIAGNOSIS — R739 Hyperglycemia, unspecified: Secondary | ICD-10-CM | POA: Diagnosis present

## 2018-03-23 DIAGNOSIS — T380X5A Adverse effect of glucocorticoids and synthetic analogues, initial encounter: Secondary | ICD-10-CM | POA: Diagnosis not present

## 2018-03-23 DIAGNOSIS — I2510 Atherosclerotic heart disease of native coronary artery without angina pectoris: Secondary | ICD-10-CM | POA: Diagnosis present

## 2018-03-23 DIAGNOSIS — J9600 Acute respiratory failure, unspecified whether with hypoxia or hypercapnia: Secondary | ICD-10-CM | POA: Diagnosis not present

## 2018-03-23 DIAGNOSIS — J9621 Acute and chronic respiratory failure with hypoxia: Secondary | ICD-10-CM | POA: Diagnosis present

## 2018-03-23 DIAGNOSIS — I70203 Unspecified atherosclerosis of native arteries of extremities, bilateral legs: Secondary | ICD-10-CM | POA: Diagnosis present

## 2018-03-23 DIAGNOSIS — Z8673 Personal history of transient ischemic attack (TIA), and cerebral infarction without residual deficits: Secondary | ICD-10-CM

## 2018-03-23 DIAGNOSIS — Z789 Other specified health status: Secondary | ICD-10-CM | POA: Diagnosis not present

## 2018-03-23 DIAGNOSIS — Z955 Presence of coronary angioplasty implant and graft: Secondary | ICD-10-CM

## 2018-03-23 DIAGNOSIS — Y92230 Patient room in hospital as the place of occurrence of the external cause: Secondary | ICD-10-CM | POA: Diagnosis not present

## 2018-03-23 DIAGNOSIS — D696 Thrombocytopenia, unspecified: Secondary | ICD-10-CM | POA: Diagnosis present

## 2018-03-23 DIAGNOSIS — I255 Ischemic cardiomyopathy: Secondary | ICD-10-CM | POA: Diagnosis present

## 2018-03-23 DIAGNOSIS — Z79899 Other long term (current) drug therapy: Secondary | ICD-10-CM | POA: Diagnosis not present

## 2018-03-23 DIAGNOSIS — F1090 Alcohol use, unspecified, uncomplicated: Secondary | ICD-10-CM | POA: Diagnosis present

## 2018-03-23 DIAGNOSIS — R7303 Prediabetes: Secondary | ICD-10-CM | POA: Diagnosis present

## 2018-03-23 DIAGNOSIS — R0902 Hypoxemia: Secondary | ICD-10-CM | POA: Diagnosis not present

## 2018-03-23 HISTORY — DX: Alcohol abuse, uncomplicated: F10.10

## 2018-03-23 HISTORY — DX: Chronic obstructive pulmonary disease, unspecified: J44.9

## 2018-03-23 HISTORY — DX: Acute on chronic systolic (congestive) heart failure: I50.23

## 2018-03-23 HISTORY — DX: Hyperglycemia, unspecified: R73.9

## 2018-03-23 HISTORY — DX: Acute respiratory failure, unspecified whether with hypoxia or hypercapnia: J96.00

## 2018-03-23 LAB — I-STAT ARTERIAL BLOOD GAS, ED
ACID-BASE DEFICIT: 12 mmol/L — AB (ref 0.0–2.0)
ACID-BASE DEFICIT: 5 mmol/L — AB (ref 0.0–2.0)
Bicarbonate: 19.2 mmol/L — ABNORMAL LOW (ref 20.0–28.0)
Bicarbonate: 21.5 mmol/L (ref 20.0–28.0)
O2 Saturation: 83 %
O2 Saturation: 97 %
PH ART: 7.107 — AB (ref 7.350–7.450)
PH ART: 7.294 — AB (ref 7.350–7.450)
PO2 ART: 99 mmHg (ref 83.0–108.0)
Patient temperature: 98.6
Patient temperature: 98.6
TCO2: 21 mmol/L — ABNORMAL LOW (ref 22–32)
TCO2: 23 mmol/L (ref 22–32)
pCO2 arterial: 60.7 mmHg — ABNORMAL HIGH (ref 32.0–48.0)
pCO2 arterial: 44.2 mmHg (ref 32.0–48.0)
pO2, Arterial: 65 mmHg — ABNORMAL LOW (ref 83.0–108.0)

## 2018-03-23 LAB — LIPID PANEL
CHOL/HDL RATIO: 3.3 ratio
CHOLESTEROL: 130 mg/dL (ref 0–200)
HDL: 39 mg/dL — AB (ref >40)
LDL CALC: 78 mg/dL (ref 0–99)
TRIGLYCERIDES: 66 mg/dL (ref <150)
VLDL: 13 mg/dL (ref 0–40)

## 2018-03-23 LAB — COMPREHENSIVE METABOLIC PANEL
ALBUMIN: 3.5 g/dL (ref 3.5–5.0)
ALT: 43 U/L (ref 0–44)
AST: 80 U/L — ABNORMAL HIGH (ref 15–41)
Alkaline Phosphatase: 95 U/L (ref 38–126)
Anion gap: 13 (ref 5–15)
BUN: 6 mg/dL (ref 6–20)
CHLORIDE: 107 mmol/L (ref 98–111)
CO2: 18 mmol/L — AB (ref 22–32)
CREATININE: 0.76 mg/dL (ref 0.44–1.00)
Calcium: 9.1 mg/dL (ref 8.9–10.3)
GFR calc non Af Amer: >60 mL/min (ref >60)
GLUCOSE: 172 mg/dL — AB (ref 70–99)
Potassium: 3.9 mmol/L (ref 3.5–5.1)
SODIUM: 138 mmol/L (ref 135–145)
Total Bilirubin: 1.3 mg/dL — ABNORMAL HIGH (ref 0.3–1.2)
Total Protein: 7.7 g/dL (ref 6.5–8.1)

## 2018-03-23 LAB — CBC WITH DIFFERENTIAL/PLATELET
Abs Immature Granulocytes: 0 10*3/uL (ref 0.0–0.1)
Basophils Absolute: 0.1 10*3/uL (ref 0.0–0.1)
Basophils Relative: 1 %
EOS ABS: 0.4 10*3/uL (ref 0.0–0.7)
Eosinophils Relative: 4 %
HEMATOCRIT: 53.6 % — AB (ref 36.0–46.0)
Hemoglobin: 17.5 g/dL — ABNORMAL HIGH (ref 12.0–15.0)
IMMATURE GRANULOCYTES: 0 %
LYMPHS ABS: 4.5 10*3/uL — AB (ref 0.7–4.0)
Lymphocytes Relative: 46 %
MCH: 31.8 pg (ref 26.0–34.0)
MCHC: 31.7 g/dL (ref 30.0–36.0)
MCV: 100.2 fL — AB (ref 78.0–100.0)
MONOS PCT: 4 %
Monocytes Absolute: 0.4 10*3/uL (ref 0.1–1.0)
NEUTROS PCT: 45 %
Neutro Abs: 4.4 10*3/uL (ref 1.7–7.7)
Platelets: 116 10*3/uL — ABNORMAL LOW (ref 150–400)
RBC: 5.51 MIL/uL — ABNORMAL HIGH (ref 3.87–5.11)
RDW: 13.3 % (ref 11.5–15.5)
WBC: 9.7 10*3/uL (ref 4.0–10.5)

## 2018-03-23 LAB — BRAIN NATRIURETIC PEPTIDE: B Natriuretic Peptide: 1069.6 pg/mL — ABNORMAL HIGH (ref 0.0–100.0)

## 2018-03-23 LAB — ECHOCARDIOGRAM COMPLETE
Height: 69 in
Weight: 2423.3 oz

## 2018-03-23 LAB — GLUCOSE, CAPILLARY
GLUCOSE-CAPILLARY: 123 mg/dL — AB (ref 70–99)
Glucose-Capillary: 130 mg/dL — ABNORMAL HIGH (ref 70–99)
Glucose-Capillary: 177 mg/dL — ABNORMAL HIGH (ref 70–99)

## 2018-03-23 LAB — I-STAT TROPONIN, ED: Troponin i, poc: 0 ng/mL (ref 0.00–0.08)

## 2018-03-23 LAB — TROPONIN I: Troponin I: 0.03 ng/mL (ref <0.03)

## 2018-03-23 LAB — CBG MONITORING, ED: Glucose-Capillary: 113 mg/dL — ABNORMAL HIGH (ref 70–99)

## 2018-03-23 LAB — MRSA PCR SCREENING: MRSA by PCR: NEGATIVE

## 2018-03-23 MED ORDER — FUROSEMIDE 10 MG/ML IJ SOLN
40.0000 mg | Freq: Two times a day (BID) | INTRAMUSCULAR | Status: DC
  Administered 2018-03-23 – 2018-03-25 (×4): 40 mg via INTRAVENOUS
  Filled 2018-03-23 (×4): qty 4

## 2018-03-23 MED ORDER — SODIUM CHLORIDE 0.9 % IV SOLN
250.0000 mL | INTRAVENOUS | Status: DC | PRN
Start: 2018-03-23 — End: 2018-03-27
  Administered 2018-03-23: 250 mL via INTRAVENOUS

## 2018-03-23 MED ORDER — FUROSEMIDE 10 MG/ML IJ SOLN: 40.0000 mg | Freq: Two times a day (BID) | INTRAMUSCULAR | Status: DC

## 2018-03-23 MED ORDER — SODIUM CHLORIDE 0.9% FLUSH
3.0000 mL | Freq: Two times a day (BID) | INTRAVENOUS | Status: DC
  Administered 2018-03-23 – 2018-03-26 (×5): 3 mL via INTRAVENOUS

## 2018-03-23 MED ORDER — THIAMINE HCL 100 MG/ML IJ SOLN
100.0000 mg | Freq: Every day | INTRAMUSCULAR | Status: DC
  Administered 2018-03-24: 100 mg via INTRAVENOUS
  Filled 2018-03-23: qty 2

## 2018-03-23 MED ORDER — IPRATROPIUM-ALBUTEROL 0.5-2.5 (3) MG/3ML IN SOLN
3.0000 mL | Freq: Four times a day (QID) | RESPIRATORY_TRACT | Status: DC
  Administered 2018-03-23 – 2018-03-24 (×5): 3 mL via RESPIRATORY_TRACT
  Filled 2018-03-23 (×3): qty 3

## 2018-03-23 MED ORDER — SACUBITRIL-VALSARTAN 97-103 MG PO TABS
1.0000 | ORAL_TABLET | Freq: Two times a day (BID) | ORAL | Status: DC
  Administered 2018-03-23 – 2018-03-25 (×6): 1 via ORAL
  Filled 2018-03-23 (×8): qty 1

## 2018-03-23 MED ORDER — ONDANSETRON HCL 4 MG/2ML IJ SOLN: 4.0000 mg | Freq: Four times a day (QID) | INTRAMUSCULAR | Status: DC | PRN

## 2018-03-23 MED ORDER — NITROGLYCERIN 2 % TD OINT
1.0000 [in_us] | TOPICAL_OINTMENT | Freq: Once | TRANSDERMAL | Status: AC
  Administered 2018-03-23: 1 [in_us] via TOPICAL
  Filled 2018-03-23: qty 1

## 2018-03-23 MED ORDER — FOLIC ACID 1 MG PO TABS
1.0000 mg | ORAL_TABLET | Freq: Every day | ORAL | Status: DC
  Administered 2018-03-23 – 2018-03-27 (×5): 1 mg via ORAL
  Filled 2018-03-23 (×5): qty 1

## 2018-03-23 MED ORDER — MAGNESIUM SULFATE 2 GM/50ML IV SOLN
2.0000 g | Freq: Once | INTRAVENOUS | Status: AC
  Administered 2018-03-23: 2 g via INTRAVENOUS
  Filled 2018-03-23: qty 50

## 2018-03-23 MED ORDER — INSULIN ASPART 100 UNIT/ML ~~LOC~~ SOLN: 0.0000 [IU] | Freq: Every day | SUBCUTANEOUS | Status: DC

## 2018-03-23 MED ORDER — METOPROLOL TARTRATE 5 MG/5ML IV SOLN
5.0000 mg | Freq: Three times a day (TID) | INTRAVENOUS | Status: DC
  Administered 2018-03-23 (×2): 5 mg via INTRAVENOUS
  Filled 2018-03-23 (×4): qty 5

## 2018-03-23 MED ORDER — LORAZEPAM 2 MG/ML IJ SOLN
INTRAMUSCULAR | Status: AC
  Administered 2018-03-23: 1 mg
  Filled 2018-03-23: qty 1

## 2018-03-23 MED ORDER — ENOXAPARIN SODIUM 40 MG/0.4ML ~~LOC~~ SOLN
40.0000 mg | SUBCUTANEOUS | Status: DC
  Administered 2018-03-23 – 2018-03-25 (×3): 40 mg via SUBCUTANEOUS
  Filled 2018-03-23 (×4): qty 0.4

## 2018-03-23 MED ORDER — SODIUM CHLORIDE 0.9% FLUSH: 3.0000 mL | INTRAVENOUS | Status: DC | PRN

## 2018-03-23 MED ORDER — INSULIN ASPART 100 UNIT/ML ~~LOC~~ SOLN
0.0000 [IU] | Freq: Three times a day (TID) | SUBCUTANEOUS | Status: DC
  Administered 2018-03-23: 2 [IU] via SUBCUTANEOUS
  Administered 2018-03-23 – 2018-03-24 (×3): 1 [IU] via SUBCUTANEOUS
  Administered 2018-03-24: 3 [IU] via SUBCUTANEOUS
  Administered 2018-03-25: 2 [IU] via SUBCUTANEOUS
  Administered 2018-03-25 – 2018-03-26 (×3): 1 [IU] via SUBCUTANEOUS

## 2018-03-23 MED ORDER — LORAZEPAM 1 MG PO TABS
1.0000 mg | ORAL_TABLET | Freq: Four times a day (QID) | ORAL | Status: AC | PRN
  Administered 2018-03-24: 1 mg via ORAL
  Filled 2018-03-23: qty 1

## 2018-03-23 MED ORDER — ADULT MULTIVITAMIN W/MINERALS CH
1.0000 | ORAL_TABLET | Freq: Every day | ORAL | Status: DC
  Administered 2018-03-23 – 2018-03-27 (×5): 1 via ORAL
  Filled 2018-03-23 (×5): qty 1

## 2018-03-23 MED ORDER — VITAMIN B-1 100 MG PO TABS
100.0000 mg | ORAL_TABLET | Freq: Every day | ORAL | Status: DC
  Administered 2018-03-23 – 2018-03-27 (×4): 100 mg via ORAL
  Filled 2018-03-23 (×5): qty 1

## 2018-03-23 MED ORDER — FUROSEMIDE 10 MG/ML IJ SOLN
40.0000 mg | Freq: Once | INTRAMUSCULAR | Status: AC
  Administered 2018-03-23: 40 mg via INTRAVENOUS
  Filled 2018-03-23: qty 4

## 2018-03-23 MED ORDER — ACETAMINOPHEN 325 MG PO TABS: 650.0000 mg | ORAL_TABLET | ORAL | Status: DC | PRN

## 2018-03-23 MED ORDER — ASPIRIN 81 MG PO CHEW
81.0000 mg | CHEWABLE_TABLET | Freq: Every day | ORAL | Status: DC
  Administered 2018-03-23 – 2018-03-27 (×5): 81 mg via ORAL
  Filled 2018-03-23 (×5): qty 1

## 2018-03-23 MED ORDER — METHYLPREDNISOLONE SODIUM SUCC 125 MG IJ SOLR
60.0000 mg | Freq: Two times a day (BID) | INTRAMUSCULAR | Status: DC
  Administered 2018-03-23 – 2018-03-25 (×5): 60 mg via INTRAVENOUS
  Filled 2018-03-23 (×5): qty 2

## 2018-03-23 MED ORDER — LORAZEPAM 2 MG/ML IJ SOLN: 1.0000 mg | Freq: Four times a day (QID) | INTRAMUSCULAR | Status: AC | PRN

## 2018-03-23 NOTE — Progress Notes (Signed)
CRITICAL VALUE ALERT  Critical Value:  Troponin 0.03  Date & Time Notied:  03/23/18 1040  Provider Notified: Lorin Mercy  Orders Received/Actions taken: no new orders given

## 2018-03-23 NOTE — H&P (Signed)
History and Physical    [name] AOZ:[id number] DOB: 1956/07/02 DOA: 03/23/2018  PCP: Vicenta Aly, FNP Patient coming from: home  Chief Complaint: dyspnea  HPI: [name] is a 62 y.o. female with medical history significant for CAD IMI 2008 with PCI , Ischemic DCM with AICD EF improved by last echo 50% 2016 Smoker with COPD, COPD not on home oxygen, EtOH abuse, tobacco abuse,a, peripheral vascular disease, hypertension, persistent emergency Department chief complaint shortness of breath. Initial evaluation reveals acute respiratory failure secondary to acute on chronic heart failure in the setting of COPD exacerbation. [facility] are asked to admit  Information is obtained from the patient and her children who are at the bedside.patient reports she is in her usual state of health until 2 days ago she developed gradual worsening shortness of breath. She is unsure of what the trigger was but she did spend some time outdoors yesterday. She uses her home inhaler without improvement. She reports compliance with all her other medications. Past night she could not catch her breath so she called 911. Reportedly EMS documented oxygen saturation level the mid 80s. She denies chest pain palpitations lower extremity edema. She denies headache dizziness syncope or near-syncope. She denies a fever does report frequent productive cough with thick white sputum. She denies abdominal pain nausea vomiting diarrhea constipation melena bright red blood per rectum. She denies dysuria hematuria frequency or urgency. She reports she continues to smoke and she also drinks 2-3 40 ounce beers per day.   ED Course: emergency department she's afebrile tachycardia hypertensive with tachypnea. He is provided with steroids and nebulizers per EMS. In the ED she is provided with Lasix and BiPAP. At the time of admission only mild increased work of breathing with conversation remains on BiPAP oxygen saturation  level 99%.  Review of Systems: As per HPI otherwise all other systems reviewed and are negative.   Ambulatory Status: ambulates independently is independent with ADLs however children report dyspnea on exertion  Past Medical History:  Diagnosis Date  . Acute on chronic systolic heart failure ([place])   . Acute respiratory failure ([place])   . Automatic implantable cardioverter-defibrillator in situ   . CAD (coronary artery disease)    a. Inf-post MI 2008 s/p BMS to large marginal of Cx.   Marland Kitchen COPD (chronic obstructive pulmonary disease) ([place])   . ETOH abuse   . Hyperglycemia   . HYPERLIPIDEMIA-MIXED 02/07/2009  . HYPERTENSION, BENIGN 08/22/2009  . PVD (peripheral vascular disease) ([place])    a. Evaluated by Dr. [name] Anon 08/2012.  Marland Kitchen SYSTOLIC HEART FAILURE, CHRONIC 02/07/2009   a. EF 30% 2010, 29% by MRI 08/2012. b. s/p prophylactic Medtronic ICD implantation 10/2012.  Marland Kitchen TIA (transient ischemic attack) 05/18/2014  . Tobacco abuse   . VENTRICULAR TACHYCARDIA 05/21/2010    Past Surgical History:  Procedure Laterality Date  . CARDIAC DEFIBRILLATOR PLACEMENT  10/2012  . CESAREAN SECTION  1977; 1982  . CYSTOSCOPY W/ STONE MANIPULATION  1990's  . IMPLANTABLE CARDIOVERTER DEFIBRILLATOR IMPLANT N/A 10/21/2012   Procedure: IMPLANTABLE CARDIOVERTER DEFIBRILLATOR IMPLANT;  Surgeon: Evans Lance, MD;  Location: [facility] CATH LAB;  Service: Cardiovascular;  Laterality: N/A;    Social History   Socioeconomic History  . Marital status: Single    Spouse name: Not on file  . Number of children: Not on file  . Years of education: Not on file  . Highest education level: Not on file  Occupational History  . Not on file  Social  Needs  . Financial resource strain: Not on file  . Food insecurity:    Worry: Not on file    Inability: Not on file  . Transportation needs:    Medical: Not on file    Non-medical: Not on file  Tobacco Use  . Smoking status: Current Every Day Smoker    Packs/day: 0.50    Years: 40.00      Pack years: 20.00    Types: Cigarettes  . Smokeless tobacco: Never Used  Substance and Sexual Activity  . Alcohol use: Yes    Alcohol/week: 12.0 oz    Types: 20 Cans of beer per week    Comment: 05/18/2014 "2, 40's qod"  . Drug use: No  . Sexual activity: Yes  Lifestyle  . Physical activity:    Days per week: Not on file    Minutes per session: Not on file  . Stress: Not on file  Relationships  . Social connections:    Talks on phone: Not on file    Gets together: Not on file    Attends religious service: Not on file    Active member of club or organization: Not on file    Attends meetings of clubs or organizations: Not on file    Relationship status: Not on file  . Intimate partner violence:    Fear of current or ex partner: Not on file    Emotionally abused: Not on file    Physically abused: Not on file    Forced sexual activity: Not on file  Other Topics Concern  . Not on file  Social History Narrative  . Not on file    Allergies  Allergen Reactions  . Clopidogrel Bisulfate Rash    Family History  Problem Relation Age of Onset  . Cancer Mother        cervical  . Heart attack Father   . Asthma Brother   . Other Brother        Pacemaker    Prior to Admission medications   Medication Sig Start Date End Date Taking? Authorizing Provider  acetaminophen (TYLENOL) 500 MG tablet Take 500 mg by mouth every 6 (six) hours as needed for headache.     [provider]  albuterol (PROAIR HFA) 108 (90 Base) MCG/ACT inhaler Inhale 2 puffs into the lungs every 6 (six) hours as needed for wheezing or shortness of breath.    [provider]  aspirin 81 MG tablet Take 81 mg by mouth daily.    [provider]  budesonide-formoterol (SYMBICORT) 160-4.5 MCG/ACT inhaler Inhale 2 puffs into the lungs 2 (two) times daily. 05/20/16   Eugenie Filler, MD  carvedilol (COREG) 12.5 MG tablet TAKE 1 TABLET (12.5 MG TOTAL) BY MOUTH 2 (TWO) TIMES DAILY WITH A  MEAL. 02/24/17   Burtis Junes, NP  feeding supplement, ENSURE COMPLETE, (ENSURE COMPLETE) LIQD Take 237 mLs by mouth daily. 05/19/14   Mikhail, Velta Addison, DO  fluticasone (FLONASE) 50 MCG/ACT nasal spray Place 2 sprays into both nostrils as needed for allergies or rhinitis.    [provider]  nitroGLYCERIN (NITROSTAT) 0.4 MG SL tablet Place 1 tablet (0.4 mg total) under the tongue every 5 (five) minutes as needed for chest pain (up to 3 doses). 11/03/17   [name], MD  sacubitril-valsartan (ENTRESTO) 97-103 MG Take 1 tablet by mouth 2 (two) times daily. 02/24/17   Burtis Junes, NP  traMADol (ULTRAM) 50 MG tablet Take 1 tablet (50 mg  total) by mouth every 6 (six) hours as needed. 03/22/17   Malvin Johns, MD    Physical Exam: Vitals:   03/23/18 0630 03/23/18 0645 03/23/18 0700 03/23/18 0733  BP: 122/81 122/81  124/80  Pulse: 88 91  84  Resp: (!) 30 (!) 24  (!) 26  SpO2: 97% 98%  99%  Weight:   68.3 kg (150 lb 9.2 oz)   Height:   5\' 9"  (1.753 m)      General:  Appears calm and comfortable, thin Eyes:  PERRL, EOMI, normal lids, iris ENT:  grossly normal hearing, lips & tongue,  Neck:  no LAD, masses or thyromegaly Cardiovascular:  RRR, no m/r/g. No LE edema.  Respiratory:  On BiPAP mild increased work of breathing with conversation. Breath sounds quite diminished throughout a year some fine crackles particularly on the right base. A faint end expiratory wheezing Abdomen:  soft, ntnd, positive bowel sounds throughout no guarding or rebounding Skin:  no rash or induration seen on limited exam Musculoskeletal:  grossly normal tone BUE/BLE, good ROM, no bony abnormality Psychiatric:  grossly normal mood and affect, speech fluent and appropriate, AOx3 Neurologic:  CN 2-12 grossly intact, moves all extremities in coordinated fashion, sensation intact follow simple commands moves all extremities spontaneously  Labs on Admission: I have personally reviewed following labs and  imaging studies  CBC: Recent Labs  Lab 03/23/18 0424  WBC 9.7  NEUTROABS 4.4  HGB 17.5*  HCT 53.6*  MCV 100.2*  PLT 734*   Basic Metabolic Panel: Recent Labs  Lab 03/23/18 0424  NA 138  K 3.9  CL 107  CO2 18*  GLUCOSE 172*  BUN 6  CREATININE 0.76  CALCIUM 9.1   GFR: Estimated Creatinine Clearance: 78.2 mL/min (by C-G formula based on SCr of 0.76 mg/dL). Liver Function Tests: Recent Labs  Lab 03/23/18 0424  AST 80*  ALT 43  ALKPHOS 95  BILITOT 1.3*  PROT 7.7  ALBUMIN 3.5   No results for input(s): LIPASE, AMYLASE in the last 168 hours. No results for input(s): AMMONIA in the last 168 hours. Coagulation Profile: No results for input(s): INR, PROTIME in the last 168 hours. Cardiac Enzymes: No results for input(s): CKTOTAL, CKMB, CKMBINDEX, TROPONINI in the last 168 hours. BNP (last 3 results) No results for input(s): PROBNP in the last 8760 hours. HbA1C: No results for input(s): HGBA1C in the last 72 hours. CBG: No results for input(s): GLUCAP in the last 168 hours. Lipid Profile: No results for input(s): CHOL, HDL, LDLCALC, TRIG, CHOLHDL, LDLDIRECT in the last 72 hours. Thyroid Function Tests: No results for input(s): TSH, T4TOTAL, FREET4, T3FREE, THYROIDAB in the last 72 hours. Anemia Panel: No results for input(s): VITAMINB12, FOLATE, FERRITIN, TIBC, IRON, RETICCTPCT in the last 72 hours. Urine analysis:    Component Value Date/Time   COLORURINE YELLOW 03/04/2010 2317   APPEARANCEUR Clear 02/18/2018 1007   LABSPEC 1.006 03/04/2010 2317   PHURINE 5.5 03/04/2010 2317   GLUCOSEU Negative 02/18/2018 1007   HGBUR NEGATIVE 03/04/2010 2317   BILIRUBINUR Negative 02/18/2018 1007   KETONESUR NEGATIVE 03/04/2010 2317   PROTEINUR Negative 02/18/2018 1007   PROTEINUR NEGATIVE 03/04/2010 2317   UROBILINOGEN 0.2 03/04/2010 2317   NITRITE Negative 02/18/2018 1007   NITRITE NEGATIVE 03/04/2010 2317   LEUKOCYTESUR 1+ (A) 02/18/2018 1007    Creatinine  Clearance: Estimated Creatinine Clearance: 78.2 mL/min (by C-G formula based on SCr of 0.76 mg/dL).  Sepsis Labs: @LABRCNTIP (procalcitonin:4,lacticidven:4) )No results found for this or any previous  visit (from the past 240 hour(s)).   Radiological Exams on Admission: Dg Chest Port 1 View  Result Date: 03/23/2018 CLINICAL DATA:  Shortness of breath EXAM: PORTABLE CHEST 1 VIEW COMPARISON:  11/05/2017 FINDINGS: Left chest wall single lead AICD is unchanged. There is mild interstitial pulmonary edema, worsened compared to the prior study. Mild cardiomegaly. No focal consolidation. No pleural effusion or pneumothorax. IMPRESSION: Cardiomegaly and mild interstitial pulmonary edema. Electronically Signed   By: Ulyses Jarred M.D.   On: 03/23/2018 05:16    EKG: Independently reviewed. Sinus tachycardia Left atrial enlargement Low voltage, extremity leads Left ventricular hypertrophy Anterior Q waves, possibly due to LVH  Assessment/Plan Principal Problem:   Acute respiratory failure with hypoxia (HCC) Active Problems:   COPD with acute exacerbation (HCC)   Essential hypertension   Acute on chronic systolic heart failure (HCC)   Smoker   Automatic implantable cardioverter-defibrillator in situ   Alcohol use (HCC)   CAD (coronary artery disease)   Hyperglycemia   #1. Acute respiratory failure with hypoxia secondary to acute on chronic systolic heart failure in the setting of COPD exacerbation. Oxygen saturation level reportedly mid 80s per EMS. BNP greater than 1000, chest x-ray with cardiomegaly and mild interstitial pulmonary edema. Initial ABG with a pH of 7.1 and a CO2 of greater than 66. Provided with Lasix continuous nebs steroids as well as nitroglycerin ointment. She is placed on BiPAP as well. At time of admission in no respiratory distress on BiPAP. -Admit to step down -Continue BiPAP -Continue steroids -Continue scheduled nebulizers -wean oxygen as able -obtain echo -monitor  oxygen saturation level  2. Acute on chronic systolic heart failure/ischemic DCM with AicD. Patient with a history of systolic heart failure with an EF of 15%. Chart review does indicate this improved to 50% 2016. Reports compliance with her medications which include Entresto and coreg. Chest x-ray as noted above. EKG sinus tach with ventricular enlargement. Initial troponin negative. BNP greater than 1000. Provided with Lasix 40 mg IV in the emergency department -Continue Lasix 40 mg IV twice a day -Provide IV metoprolol while on BiPAP with parameters -continue entresto -Obtain a 2-D echo -Monitor intake and output -Obtain daily weights -consider or cardiology consult pending echo results  #3. COPD with acute exacerbation. Patient not on home oxygen but family does report dyspnea on exertion. She continues to smoke. Chest x-ray as noted above. See #1 -Scheduled nebulizers -Wean BiPAP as able -Solu-Medrol -may need home oxygen at discharge -continue home nebs when indicated  4.CAD.status post MI 2008 with PCI. EKG as noted above. Initial troponin negative. Denies chest pain. Home medications include aspirin. -repeat troponin -monitor tele -continue home meds  5.hypertension. Blood pressure on the high end of normal in the emergency department. [place] with Lasix and Nitropaste in the emergency department home medications include Coreg. Chart review indicates previously on spironolactone.time of admission blood pressure controlled - IV metoprolol until able to resume home Coreg -Lasix as noted above -monitor  #6. EtOH use. No signs of withdrawal. She reports drinking several 40 ounce bottles of beer daily. -CIWA  7. Hyperglycemia. Likely steroid-induced. Serum glucose 172. -Obtain a hemoglobin A1c -Sliding scale insulin for optimal control  #8. Tobacco use. -Cessation counseling offered     DVT prophylaxis: lovenox  Code Status: full  Family Communication: daughters at  baseline  Disposition Plan: home  Consults called: none  Admission status: inpateint    Radene Gunning MD [facility]  If 7PM-7AM, please contact night-coverage [email or web address]  Password [email or web address]  03/23/2018, 7:57 AM

## 2018-03-23 NOTE — ED Triage Notes (Signed)
Patient with history of COPD with increased shortness of breath.  Patient with labored breathing and one to two word sentences on arrival to ED.  Patient with diminished breath sounds.  No chest pain, but she is diaphoretic on arrival.

## 2018-03-23 NOTE — ED Provider Notes (Signed)
[place] [facility] EMERGENCY DEPARTMENT Provider Note   CSN: [id number] Arrival date & time: 03/23/18  0354     History   Chief Complaint Chief Complaint  Patient presents with  . Respiratory Distress    HPI [name] is a 62 y.o. female.  Patient is a 62 year old female with history of CHF with pacemaker, defibrillator, TIA, hypertension, COPD.  She presents for evaluation of shortness of breath.  This is been worsening over the past 24 hours.  It became worse this evening and she called 911.  She was found to be in significant respiratory distress with oxygen saturations in the mid 80s.  She was placed on supplemental oxygen, then transported here.  She received a breathing treatment in route with little improvement.  She denies any fevers or chills.  History is somewhat limited secondary to acuity of condition.  The history is provided by the patient.    Past Medical History:  Diagnosis Date  . Automatic implantable cardioverter-defibrillator in situ   . CAD (coronary artery disease)    a. Inf-post MI 2008 s/p BMS to large marginal of Cx.   Marland Kitchen HYPERLIPIDEMIA-MIXED 02/07/2009  . HYPERTENSION, BENIGN 08/22/2009  . PVD (peripheral vascular disease) ([place])    a. Evaluated by Dr. [name] Anon 08/2012.  Marland Kitchen SYSTOLIC HEART FAILURE, CHRONIC 02/07/2009   a. EF 30% 2010, 29% by MRI 08/2012. b. s/p prophylactic Medtronic ICD implantation 10/2012.  Marland Kitchen TIA (transient ischemic attack) 05/18/2014  . Tobacco abuse   . VENTRICULAR TACHYCARDIA 05/21/2010    Patient Active Problem List   Diagnosis Date Noted  . COPD exacerbation ([place])   . CAP (community acquired pneumonia) 05/17/2016  . Essential hypertension   . COPD with acute exacerbation ([place]) 12/02/2014  . Systolic heart failure ([place]) 12/02/2014  . Dyspnea 12/02/2014  . Alcohol use ([place]) 12/02/2014  . Elevated hemoglobin ([place]) 12/02/2014  . Acute bronchitis 12/02/2014  . Acute respiratory failure with hypoxia ([place])   .  Malnutrition of moderate degree ([place]) 05/19/2014  . Right facial numbness 05/18/2014  . Weakness of right lower extremity 05/18/2014  . TIA (transient ischemic attack) 05/18/2014  . 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  . Smoker 08/11/2012  . PVD (peripheral vascular disease) ([place]) 08/11/2012  . VENTRICULAR TACHYCARDIA 05/21/2010  . BEN HTN HEART DISEASE WITHOUT HEART FAIL 08/28/2009  . HYPERTENSION, BENIGN 08/22/2009  . CHEST PAIN-PRECORDIAL 07/18/2009  . CARDIOVASCULAR FUNCTION STUDY, ABNORMAL 07/18/2009  . Elevated lipids 02/07/2009  . CAD, NATIVE VESSEL 02/07/2009  . Congestive dilated cardiomyopathy ([place]) 02/07/2009  . SYSTOLIC HEART FAILURE, CHRONIC 02/07/2009    Past Surgical History:  Procedure Laterality Date  . CARDIAC DEFIBRILLATOR PLACEMENT  10/2012  . CESAREAN SECTION  1977; 1982  . CYSTOSCOPY W/ STONE MANIPULATION  1990's  . IMPLANTABLE CARDIOVERTER DEFIBRILLATOR IMPLANT N/A 10/21/2012   Procedure: IMPLANTABLE CARDIOVERTER DEFIBRILLATOR IMPLANT;  Surgeon: Evans Lance, MD;  Location: [facility] CATH LAB;  Service: Cardiovascular;  Laterality: N/A;     OB History   None      Home Medications    Prior to Admission medications   Medication Sig Start Date End Date Taking? Authorizing Provider  acetaminophen (TYLENOL) 500 MG tablet Take 500 mg by mouth every 6 (six) hours as needed for headache.     [provider]  albuterol (PROAIR HFA) 108 (90 Base) MCG/ACT inhaler Inhale 2 puffs into the lungs every 6 (six) hours as needed for wheezing or shortness of breath.    [provider]  aspirin 81 MG tablet Take 81 mg by mouth daily.    [provider]  budesonide-formoterol (SYMBICORT) 160-4.5 MCG/ACT inhaler Inhale 2 puffs into the lungs 2 (two) times daily. 05/20/16   Eugenie Filler, MD  carvedilol (COREG) 12.5 MG tablet TAKE 1 TABLET (12.5 MG TOTAL) BY MOUTH 2 (TWO) TIMES DAILY WITH A MEAL. 02/24/17   Burtis Junes, NP  feeding supplement, ENSURE COMPLETE, (ENSURE COMPLETE) LIQD Take 237 mLs by mouth daily. 05/19/14   Mikhail, Velta Addison, DO  fluticasone (FLONASE) 50 MCG/ACT nasal spray Place 2 sprays into both nostrils as needed for allergies or rhinitis.    [provider]  nitroGLYCERIN (NITROSTAT) 0.4 MG SL tablet Place 1 tablet (0.4 mg total) under the tongue every 5 (five) minutes as needed for chest pain (up to 3 doses). 11/03/17   [name], MD  sacubitril-valsartan (ENTRESTO) 97-103 MG Take 1 tablet by mouth 2 (two) times daily. 02/24/17   Burtis Junes, NP  traMADol (ULTRAM) 50 MG tablet Take 1 tablet (50 mg total) by mouth every 6 (six) hours as needed. 03/22/17   Malvin Johns, MD    Family History Family History  Problem Relation Age of Onset  . Cancer Mother        cervical  . Heart attack Father   . Asthma Brother   . Other Brother        Pacemaker    Social History Social History   Tobacco Use  . Smoking status: Current Every Day Smoker    Packs/day: 0.50    Years: 40.00    Pack years: 20.00    Types: Cigarettes  . Smokeless tobacco: Never Used  Substance Use Topics  . Alcohol use: Yes    Alcohol/week: 12.0 oz    Types: 20 Cans of beer per week    Comment: 05/18/2014 "2, 40's qod"  . Drug use: No     Allergies   Clopidogrel bisulfate   Review of Systems Review of Systems  All other systems reviewed and are negative.    Physical Exam Updated Vital Signs There were no vitals taken for this visit.  Physical Exam  Constitutional: She is oriented to person, place, and time. She appears well-developed and well-nourished. She appears distressed.  Patient appears anxious and in respiratory distress.  HENT:  Head: Normocephalic and atraumatic.  Neck: Normal range of motion. Neck supple.  Cardiovascular: Normal rate and regular rhythm. Exam reveals no gallop and no friction rub.  No murmur heard. Pulmonary/Chest: She is in respiratory distress. She  has no wheezes. She has rales.  There are rales in the bases bilaterally.  Inspiratory effort is poor and she is tachypneic.  Abdominal: Soft. Bowel sounds are normal. She exhibits no distension. There is no tenderness.  Musculoskeletal: Normal range of motion. She exhibits no edema.  Neurological: She is alert and oriented to person, place, and time.  Skin: Skin is warm and dry. She is not diaphoretic.  Nursing note and vitals reviewed.    ED Treatments / Results  Labs (all labs ordered are listed, but only abnormal results are displayed) Labs Reviewed  COMPREHENSIVE METABOLIC PANEL  CBC WITH DIFFERENTIAL/PLATELET  BRAIN NATRIURETIC PEPTIDE  BLOOD GAS, ARTERIAL  I-STAT TROPONIN, ED    EKG None  Radiology No results found.  Procedures Procedures (including critical care time)  Medications Ordered in ED Medications  furosemide (LASIX) injection 40 mg (has no administration in time range)  nitroGLYCERIN (NITROGLYN) 2 %  ointment 1 inch (has no administration in time range)  LORazepam (ATIVAN) 2 MG/ML injection (has no administration in time range)     Initial Impression / Assessment and Plan / ED Course  I have reviewed the triage vital signs and the nursing notes.  Pertinent labs & imaging results that were available during my care of the patient were reviewed by me and considered in my medical decision making (see chart for details).  Patient is a 62 year old female with history of CHF and COPD presenting with dyspnea.  She was found by EMS to be in significant respiratory distress with hypoxia.  She remained in respiratory distress upon arrival.  She appeared extremely anxious.  She was placed on BiPAP.  Initial ABG revealed a respiratory acidosis with improvement after 60 minutes of treatment.  She was also given IV Lasix, steroids by EMS, and nebulized bronchodilators.  She now appears more comfortable and is improving.  Remainder of the work-up reveals no significant  abnormality.  I suspect a combination of CHF and COPD.  She will be admitted to the hospitalist service under the care of Dr. Alcario Drought.  CRITICAL CARE Performed by: Veryl Speak Total critical care time: 35 minutes Critical care time was exclusive of separately billable procedures and treating other patients. Critical care was necessary to treat or prevent imminent or life-threatening deterioration. Critical care was time spent personally by me on the following activities: development of treatment plan with patient and/or surrogate as well as nursing, discussions with consultants, evaluation of patient's response to treatment, examination of patient, obtaining history from patient or surrogate, ordering and performing treatments and interventions, ordering and review of laboratory studies, ordering and review of radiographic studies, pulse oximetry and re-evaluation of patient's condition.   Final Clinical Impressions(s) / ED Diagnoses   Final diagnoses:  None    ED Discharge Orders    None       Veryl Speak, MD 03/23/18 [phone]

## 2018-03-23 NOTE — Progress Notes (Signed)
Pt transported on bipap from ED D34 to 4J75 with no complications. Pt stable throughout. VS within normal limits.

## 2018-03-23 NOTE — Progress Notes (Signed)
   03/23/18 1100  Clinical Encounter Type  Visited With Patient  Visit Type Initial  Referral From Nurse  Consult/Referral To Chaplain  Spiritual Encounters  Spiritual Needs Brochure;Emotional  Stress Factors  Patient Stress Factors Exhausted    Pt wants daughter to come and help her in completion of POA papers. Chaplain provided info about the papers and provided compassionate presence.  No medication comments found.

## 2018-03-23 NOTE — Progress Notes (Signed)
  Echocardiogram 2D Echocardiogram has been performed.  [name] 03/23/2018, 2:56 PM

## 2018-03-23 NOTE — ED Notes (Signed)
Pt BIB EMS for SOB x 2 days. Pt can only speak 1 to 2 work sentences.

## 2018-03-24 LAB — BASIC METABOLIC PANEL
Anion gap: 10 (ref 5–15)
BUN: 12 mg/dL (ref 6–20)
CALCIUM: 9 mg/dL (ref 8.9–10.3)
CO2: 26 mmol/L (ref 22–32)
Chloride: 103 mmol/L (ref 98–111)
Creatinine, Ser: 0.72 mg/dL (ref 0.44–1.00)
GFR calc Af Amer: >60 mL/min (ref >60)
GFR calc non Af Amer: >60 mL/min (ref >60)
GLUCOSE: 139 mg/dL — AB (ref 70–99)
POTASSIUM: 4.1 mmol/L (ref 3.5–5.1)
Sodium: 139 mmol/L (ref 135–145)

## 2018-03-24 LAB — CBC
HEMATOCRIT: 50 % — AB (ref 36.0–46.0)
HEMOGLOBIN: 16.9 g/dL — AB (ref 12.0–15.0)
MCH: 31.1 pg (ref 26.0–34.0)
MCHC: 33.8 g/dL (ref 30.0–36.0)
MCV: 91.9 fL (ref 78.0–100.0)
Platelets: 112 10*3/uL — ABNORMAL LOW (ref 150–400)
RBC: 5.44 MIL/uL — ABNORMAL HIGH (ref 3.87–5.11)
RDW: 13 % (ref 11.5–15.5)
WBC: 8.5 10*3/uL (ref 4.0–10.5)

## 2018-03-24 LAB — GLUCOSE, CAPILLARY
Glucose-Capillary: 134 mg/dL — ABNORMAL HIGH (ref 70–99)
Glucose-Capillary: 203 mg/dL — ABNORMAL HIGH (ref 70–99)
Glucose-Capillary: 135 mg/dL — ABNORMAL HIGH (ref 70–99)

## 2018-03-24 LAB — HIV ANTIBODY (ROUTINE TESTING W REFLEX): HIV Screen 4th Generation wRfx: NONREACTIVE

## 2018-03-24 MED ORDER — WHITE PETROLATUM EX OINT
TOPICAL_OINTMENT | CUTANEOUS | Status: AC
  Administered 2018-03-24: 10:00:00
  Filled 2018-03-24: qty 28.35

## 2018-03-24 MED ORDER — ALBUTEROL SULFATE (2.5 MG/3ML) 0.083% IN NEBU: 3.0000 mL | INHALATION_SOLUTION | Freq: Four times a day (QID) | RESPIRATORY_TRACT | Status: DC | PRN

## 2018-03-24 MED ORDER — NICOTINE 14 MG/24HR TD PT24
14.0000 mg | MEDICATED_PATCH | Freq: Every day | TRANSDERMAL | Status: DC
  Administered 2018-03-24 – 2018-03-27 (×4): 14 mg via TRANSDERMAL
  Filled 2018-03-24 (×4): qty 1

## 2018-03-24 MED ORDER — BISACODYL 5 MG PO TBEC
10.0000 mg | DELAYED_RELEASE_TABLET | Freq: Once | ORAL | Status: AC
  Administered 2018-03-24: 10 mg via ORAL
  Filled 2018-03-24: qty 2

## 2018-03-24 MED ORDER — IPRATROPIUM-ALBUTEROL 0.5-2.5 (3) MG/3ML IN SOLN: 3.0000 mL | Freq: Four times a day (QID) | RESPIRATORY_TRACT | Status: DC | PRN

## 2018-03-24 MED ORDER — MOMETASONE FURO-FORMOTEROL FUM 200-5 MCG/ACT IN AERO
2.0000 | INHALATION_SPRAY | Freq: Two times a day (BID) | RESPIRATORY_TRACT | Status: DC
  Administered 2018-03-25 – 2018-03-27 (×3): 2 via RESPIRATORY_TRACT
  Filled 2018-03-24 (×3): qty 8.8

## 2018-03-24 MED ORDER — CARVEDILOL 12.5 MG PO TABS
12.5000 mg | ORAL_TABLET | Freq: Two times a day (BID) | ORAL | Status: DC
  Administered 2018-03-24 – 2018-03-26 (×4): 12.5 mg via ORAL
  Filled 2018-03-24 (×5): qty 1

## 2018-03-24 NOTE — Progress Notes (Signed)
PROGRESS NOTE    [name]  RXV:[id number] DOB: 1955-12-27 DOA: 03/23/2018 PCP: Vicenta Aly, FNP   Brief Narrative:62 y.o. female with medical history significant for CAD IMI 2008 with PCI , Ischemic DCM with AICD EF improved by last echo 50% 2016 Smoker with COPD, COPD not on home oxygen, EtOH abuse, tobacco abuse,a, peripheral vascular disease, hypertension, persistent emergency Department chief complaint shortness of breath. Initial evaluation reveals acute respiratory failure secondary to acute on chronic heart failure in the setting of COPD exacerbation. [facility] are asked to admit  Information is obtained from the patient and her children who are at the bedside.patient reports she is in her usual state of health until 2 days ago she developed gradual worsening shortness of breath. She is unsure of what the trigger was but she did spend some time outdoors yesterday. She uses her home inhaler without improvement. She reports compliance with all her other medications. Past night she could not catch her breath so she called 911. Reportedly EMS documented oxygen saturation level the mid 80s. She denies chest pain palpitations lower extremity edema. She denies headache dizziness syncope or near-syncope. She denies a fever does report frequent productive cough with thick white sputum. She denies abdominal pain nausea vomiting diarrhea constipation melena bright red blood per rectum. She denies dysuria hematuria frequency or urgency. She reports she continues to smoke and she also drinks 2-3 40 ounce beers per day.   ED Course: emergency department she's afebrile tachycardia hypertensive with tachypnea. He is provided with steroids and nebulizers per EMS. In the ED she is provided with Lasix and BiPAP. At the time of admission only mild increased work of breathing with conversation remains on BiPAP oxygen saturation level 99%.     Assessment & Plan:   Principal Problem:  Acute respiratory failure with hypoxia (HCC) Active Problems:   Smoker   Automatic implantable cardioverter-defibrillator in situ   COPD with acute exacerbation (HCC)   Alcohol use (HCC)   Essential hypertension   CAD (coronary artery disease)   Acute on chronic systolic heart failure (HCC)   Hyperglycemia  1] acute hypoxic respiratory failure secondary to acute on chronic systolic heart failure with COPD exacerbation-upon admission her BNP was greater than thousand.  Chest x-ray showed mild interstitial pulmonary edema.  ABG showed a CO2 greater than 66.  Patient received Lasix and continuous nebulizer treatments in the ER.  Initially she was also placed on BiPAP.  This morning she is on nasal cannula breathing comfortably talking in full sentences in no acute respiratory distress at all.  Continue nebulizers, steroids, taper oxygen.  Continue Lasix 40 mg twice a day, Entresto.  She is negative over 1 L since admit.  Echocardiogram has been done the results are pending.  She thinks she is probably noncompliant with diet.  She also reports that she noticed her leg swelling up her prior to coming into the hospital.  2] COPD exacerbation continue IV steroids and nebulizers Taper steroids as tolerated.  3] history of CAD MI and PCI in 2008 cardiac enzymes are negative.  Follow-up echo.  Continue current cardiac medications.  4] history of alcohol abuse currently on CIWA scale does not appear to have any withdrawal symptoms at this time.  She reports drinking several bottles of beer daily.  5] tobacco use nicotine patch ordered.  6] hyperglycemia secondary to steroids.  SSI and follow-up.   DVT prophylaxis:lovenox Code Status:full Family Communicationnone Disposition Plan tbd  Consultants: none  Procedures:none Antimicrobialsnone  Subjective: Feels well talking in full sentences awake and alert feels breathing is better since chest pain nausea vomiting.  Complains of  constipation.  Objective: Vitals:   03/24/18 0730 03/24/18 0800 03/24/18 0900 03/24/18 1033  BP:  119/65    Pulse:  (!) 58 73   Resp:  14 19   Temp: 98 F (36.7 C)   98.2 F (36.8 C)  TempSrc: Oral   Oral  SpO2:  99% 93%   Weight:      Height:        Intake/Output Summary (Last 24 hours) at 03/24/2018 1042 Last data filed at 03/24/2018 0800 Gross per 24 hour  Intake 811.41 ml  Output 2100 ml  Net -1288.59 ml   Filed Weights   03/23/18 0700 03/23/18 0900 03/24/18 0343  Weight: 68.3 kg (150 lb 9.2 oz) 68.7 kg (151 lb 7.3 oz) 68.2 kg (150 lb 5.7 oz)    Examination:  General exam: Appears calm and comfortable  Respiratory system: Crackles bases to auscultation. Respiratory effort normal. Cardiovascular system: S1 & S2 heard, RRR. No JVD, murmurs, rubs, gallops or clicks. No pedal edema. Gastrointestinal system: Abdomen is nondistended, soft and nontender. No organomegaly or masses felt. Normal bowel sounds heard. Central nervous system: Alert and oriented. No focal neurological deficits. Extremities:trace edema Skin: No rashes, lesions or ulcers Psychiatry: Judgement and insight appear normal. Mood & affect appropriate.     Data Reviewed: I have personally reviewed following labs and imaging studies  CBC: Recent Labs  Lab 03/23/18 0424 03/24/18 0449  WBC 9.7 8.5  NEUTROABS 4.4  --   HGB 17.5* 16.9*  HCT 53.6* 50.0*  MCV 100.2* 91.9  PLT 116* 161*   Basic Metabolic Panel: Recent Labs  Lab 03/23/18 0424 03/24/18 0334  NA 138 139  K 3.9 4.1  CL 107 103  CO2 18* 26  GLUCOSE 172* 139*  BUN 6 12  CREATININE 0.76 0.72  CALCIUM 9.1 9.0   GFR: Estimated Creatinine Clearance: 78.2 mL/min (by C-G formula based on SCr of 0.72 mg/dL). Liver Function Tests: Recent Labs  Lab 03/23/18 0424  AST 80*  ALT 43  ALKPHOS 95  BILITOT 1.3*  PROT 7.7  ALBUMIN 3.5   No results for input(s): LIPASE, AMYLASE in the last 168 hours. No results for input(s): AMMONIA in  the last 168 hours. Coagulation Profile: No results for input(s): INR, PROTIME in the last 168 hours. Cardiac Enzymes: Recent Labs  Lab 03/23/18 0900  TROPONINI 0.03*   BNP (last 3 results) No results for input(s): PROBNP in the last 8760 hours. HbA1C: No results for input(s): HGBA1C in the last 72 hours. CBG: Recent Labs  Lab 03/23/18 1200 03/23/18 1704 03/23/18 2154 03/24/18 0729 03/24/18 1031  GLUCAP 130* 177* 123* 134* 203*   Lipid Profile: Recent Labs    03/23/18 0900  CHOL 130  HDL 39*  LDLCALC 78  TRIG 66  CHOLHDL 3.3   Thyroid Function Tests: No results for input(s): TSH, T4TOTAL, FREET4, T3FREE, THYROIDAB in the last 72 hours. Anemia Panel: No results for input(s): VITAMINB12, FOLATE, FERRITIN, TIBC, IRON, RETICCTPCT in the last 72 hours. Sepsis Labs: No results for input(s): PROCALCITON, LATICACIDVEN in the last 168 hours.  Recent Results (from the past 240 hour(s))  MRSA PCR Screening     Status: None   Collection Time: 03/23/18  8:59 AM  Result Value Ref Range Status   MRSA by PCR NEGATIVE NEGATIVE Final    Comment:  The GeneXpert MRSA Assay (FDA approved for NASAL specimens only), is one component of a comprehensive MRSA colonization surveillance program. It is not intended to diagnose MRSA infection nor to guide or monitor treatment for MRSA infections. Performed at [facility], [place] [address] [place], [place] [address]          Radiology Studies: Dg Chest Port 1 View  Result Date: 03/23/2018 CLINICAL DATA:  Shortness of breath EXAM: PORTABLE CHEST 1 VIEW COMPARISON:  11/05/2017 FINDINGS: Left chest wall single lead AICD is unchanged. There is mild interstitial pulmonary edema, worsened compared to the prior study. Mild cardiomegaly. No focal consolidation. No pleural effusion or pneumothorax. IMPRESSION: Cardiomegaly and mild interstitial pulmonary edema. Electronically Signed   By: Ulyses Jarred M.D.   On: 03/23/2018  05:16        Scheduled Meds: . aspirin  81 mg Oral Daily  . bisacodyl  10 mg Oral Once  . enoxaparin (LOVENOX) injection  40 mg Subcutaneous Q24H  . folic acid  1 mg Oral Daily  . furosemide  40 mg Intravenous BID  . insulin aspart  0-5 Units Subcutaneous QHS  . insulin aspart  0-9 Units Subcutaneous TID WC  . methylPREDNISolone (SOLU-MEDROL) injection  60 mg Intravenous Q12H  . metoprolol tartrate  5 mg Intravenous Q8H  . multivitamin with minerals  1 tablet Oral Daily  . sacubitril-valsartan  1 tablet Oral BID  . sodium chloride flush  3 mL Intravenous Q12H  . thiamine  100 mg Oral Daily   Or  . thiamine  100 mg Intravenous Daily   Continuous Infusions: . sodium chloride 10 mL/hr at 03/24/18 0600     LOS: 1 day     Georgette Shell, MD [facility]  If 7PM-7AM, please contact night-coverage [email or web address] Password [facility] 03/24/2018, 10:42 AM

## 2018-03-24 NOTE — Evaluation (Signed)
Physical Therapy Evaluation & Discharge Patient Details Name: [name] MRN: [id number] DOB: 05-Mar-1956 Today's Date: 03/24/2018   History of Present Illness  Pt is a 62 y.o. female admitted 03/23/18 with acute respiratory failure requiring BiPAP. ABG showed elevated CO2. Worked up for COPD exacerbation. PMH includes COPD, CAD, alcohol abuse, AICD, HTN.    Clinical Impression  Patient evaluated by Physical Therapy with no further acute PT needs identified. PTA, pt indep and lives with family. Today, pt ambulating independently; DGI 21/24 indicates decreased risk for falls with higher level balance activities. SpO2 91% on RA. All education has been completed and the patient has no further questions. Encouraged pt to continue ambulating during hospital admission (RN notified). PT is signing off. Thank you for this referral.    Follow Up Recommendations No PT follow up;Supervision - Intermittent    Equipment Recommendations  None recommended by PT    Recommendations for Other Services       Precautions / Restrictions Precautions Precautions: None Restrictions Weight Bearing Restrictions: No      Mobility  Bed Mobility Overal bed mobility: Independent             General bed mobility comments: Received sitting EOB   Transfers Overall transfer level: Independent Equipment used: None Transfers: Sit to/from Stand              Ambulation/Gait Ambulation/Gait assistance: Independent Social research officer, government (Feet): 400 Feet Assistive device: None Gait Pattern/deviations: Step-through pattern;Decreased stride length Gait velocity: Decreased   General Gait Details: Initial L knee buckling, pt able to self-correct  Stairs            Wheelchair Mobility    Modified Rankin (Stroke Patients Only)       Balance Overall balance assessment: Needs assistance   Sitting balance-Leahy Scale: Good       Standing balance-Leahy Scale: Good                    Standardized Balance Assessment Standardized Balance Assessment : Dynamic Gait Index   Dynamic Gait Index Level Surface: Normal Change in Gait Speed: Mild Impairment Gait with Horizontal Head Turns: Normal Gait with Vertical Head Turns: Normal Gait and Pivot Turn: Normal Step Over Obstacle: Mild Impairment Step Around Obstacles: Normal Steps: Mild Impairment Total Score: 21       Pertinent Vitals/Pain Pain Assessment: No/denies pain    Home Living Family/patient expects to be discharged to:: Private residence Living Arrangements: Spouse/significant other Available Help at Discharge: Family;Available 24 hours/day Type of Home: House Home Access: Level entry     Home Layout: One level        Prior Function Level of Independence: Independent               Hand Dominance        Extremity/Trunk Assessment   Upper Extremity Assessment Upper Extremity Assessment: Overall WFL for tasks assessed    Lower Extremity Assessment Lower Extremity Assessment: Overall WFL for tasks assessed    Cervical / Trunk Assessment Cervical / Trunk Assessment: Normal  Communication   Communication: No difficulties  Cognition Arousal/Alertness: Awake/alert Behavior During Therapy: WFL for tasks assessed/performed Overall Cognitive Status: Within Functional Limits for tasks assessed                                        General Comments General comments (skin integrity, edema, etc.):  Husband and family present during session    Exercises     Assessment/Plan    PT Assessment Patent does not need any further PT services  PT Problem List         PT Treatment Interventions      PT Goals (Current goals can be found in the Care Plan section)  Acute Rehab PT Goals Patient Stated Goal: Return home PT Goal Formulation: All assessment and education complete, DC therapy    Frequency     Barriers to discharge        Co-evaluation                AM-PAC PT "6 Clicks" Daily Activity  Outcome Measure Difficulty turning over in bed (including adjusting bedclothes, sheets and blankets)?: None Difficulty moving from lying on back to sitting on the side of the bed? : None Difficulty sitting down on and standing up from a chair with arms (e.g., wheelchair, bedside commode, etc,.)?: None Help needed moving to and from a bed to chair (including a wheelchair)?: None Help needed walking in hospital room?: None Help needed climbing 3-5 steps with a railing? : A Little 6 Click Score: 23    End of Session Equipment Utilized During Treatment: Gait belt Activity Tolerance: Patient tolerated treatment well Patient left: in bed;with call bell/phone within reach;with family/visitor present Nurse Communication: Mobility status PT Visit Diagnosis: Other abnormalities of gait and mobility (R26.89)    Time: 5498-2641 PT Time Calculation (min) (ACUTE ONLY): 13 min   Charges:   PT Evaluation $PT Eval Low Complexity: 1 Low     PT G Codes:       Mabeline Caras, PT, DPT Acute Rehab Services  Pager: [place] 03/24/2018, 1:33 PM

## 2018-03-24 NOTE — Care Management Note (Addendum)
Case Management Note  Patient Details  Name: [name] MRN: [id number] Date of Birth: 1956/09/03  Subjective/Objective:      Pt admitted with SOB              Action/Plan:  PTA independent from home with boyfriend.  Pt has PCP and denied barriers with obtaining/paying for medications.  Pt educated on importance of daily weights and low salt diet.     Expected Discharge Date:                  Expected Discharge Plan:  Home/Self Care  In-House Referral:     Discharge planning Services  CM Consult  Post Acute Care Choice:    Choice offered to:     DME Arranged:    DME Agency:     HH Arranged:    HH Agency:     Status of Service:  In process, will continue to follow  If discussed at Long Length of Stay Meetings, dates discussed:    Additional Comments:  Maryclare Labrador, RN 03/24/2018, 4:14 PM

## 2018-03-25 ENCOUNTER — Other Ambulatory Visit: Payer: Self-pay

## 2018-03-25 DIAGNOSIS — I10 Essential (primary) hypertension: Secondary | ICD-10-CM

## 2018-03-25 DIAGNOSIS — I5023 Acute on chronic systolic (congestive) heart failure: Secondary | ICD-10-CM

## 2018-03-25 DIAGNOSIS — F17200 Nicotine dependence, unspecified, uncomplicated: Secondary | ICD-10-CM

## 2018-03-25 DIAGNOSIS — Z95810 Presence of automatic (implantable) cardiac defibrillator: Secondary | ICD-10-CM

## 2018-03-25 DIAGNOSIS — Z789 Other specified health status: Secondary | ICD-10-CM

## 2018-03-25 LAB — BASIC METABOLIC PANEL
Anion gap: 12 (ref 5–15)
BUN: 22 mg/dL — AB (ref 6–20)
CO2: 24 mmol/L (ref 22–32)
CREATININE: 0.78 mg/dL (ref 0.44–1.00)
Calcium: 9 mg/dL (ref 8.9–10.3)
Chloride: 100 mmol/L (ref 98–111)
GFR calc Af Amer: >60 mL/min (ref >60)
GFR calc non Af Amer: >60 mL/min (ref >60)
Glucose, Bld: 122 mg/dL — ABNORMAL HIGH (ref 70–99)
Potassium: 3.5 mmol/L (ref 3.5–5.1)
SODIUM: 136 mmol/L (ref 135–145)

## 2018-03-25 LAB — HEMOGLOBIN A1C
Hgb A1c MFr Bld: 5.7 % — ABNORMAL HIGH (ref 4.8–5.6)
Mean Plasma Glucose: 116.89 mg/dL

## 2018-03-25 LAB — GLUCOSE, CAPILLARY
GLUCOSE-CAPILLARY: 136 mg/dL — AB (ref 70–99)
GLUCOSE-CAPILLARY: 123 mg/dL — AB (ref 70–99)
Glucose-Capillary: 169 mg/dL — ABNORMAL HIGH (ref 70–99)
Glucose-Capillary: 129 mg/dL — ABNORMAL HIGH (ref 70–99)

## 2018-03-25 MED ORDER — PREDNISONE 20 MG PO TABS
40.0000 mg | ORAL_TABLET | Freq: Every day | ORAL | Status: DC
  Administered 2018-03-26: 40 mg via ORAL
  Filled 2018-03-25: qty 2

## 2018-03-25 MED ORDER — SPIRONOLACTONE 12.5 MG HALF TABLET
12.5000 mg | ORAL_TABLET | Freq: Every day | ORAL | Status: DC
Start: 2018-03-25 — End: 2018-03-27
  Administered 2018-03-25 – 2018-03-27 (×2): 12.5 mg via ORAL
  Filled 2018-03-25 (×3): qty 1

## 2018-03-25 MED ORDER — SODIUM CHLORIDE 0.9 % IV SOLN
250.0000 mL | INTRAVENOUS | Status: DC | PRN
  Administered 2018-03-26: 250 mL via INTRAVENOUS

## 2018-03-25 MED ORDER — SODIUM CHLORIDE 0.9% FLUSH: 3.0000 mL | INTRAVENOUS | Status: DC | PRN

## 2018-03-25 MED ORDER — FUROSEMIDE 10 MG/ML IJ SOLN
40.0000 mg | Freq: Two times a day (BID) | INTRAMUSCULAR | Status: DC
  Administered 2018-03-25: 40 mg via INTRAVENOUS
  Filled 2018-03-25: qty 4

## 2018-03-25 MED ORDER — SODIUM CHLORIDE 0.9 % IV SOLN
INTRAVENOUS | Status: DC
  Administered 2018-03-26: 06:00:00 via INTRAVENOUS

## 2018-03-25 MED ORDER — ASPIRIN 81 MG PO CHEW
81.0000 mg | CHEWABLE_TABLET | ORAL | Status: AC
  Administered 2018-03-26: 81 mg via ORAL
  Filled 2018-03-25: qty 1

## 2018-03-25 MED ORDER — SODIUM CHLORIDE 0.9% FLUSH: 3.0000 mL | Freq: Two times a day (BID) | INTRAVENOUS | Status: DC

## 2018-03-25 NOTE — Progress Notes (Signed)
PROGRESS NOTE    [name]  [id number]:[id number] DOB: [birth year] DOA: 03/23/2018 PCP: Vicenta Aly, FNP   Brief Narrative:  62 y.o.femalewith medical history significantforCAD IMI 2008 with PCI , Ischemic DCM with AICD EF improved by last echo 50% 2016 Smoker with COPD, COPD not on home oxygen, EtOH abuse, tobacco abuse,[name], peripheral vascular disease, hypertension, persistent emergency Department chief complaint shortness of breath. Initial evaluation reveals acute respiratory failure secondary to acute on chronic heart failure in the setting of COPD exacerbation. [facility] are asked to admit  Information is obtained from the patient and her children who are at the bedside.patient reports she is in her usual state of health until 2 days ago she developed gradual worsening shortness of breath. She is unsure of what the trigger was but she did spend some time outdoors yesterday. She uses her home inhaler without improvement. She reports compliance with all her other medications. Past night she could not catch her breath so she called 911. Reportedly EMS documented oxygen saturation level the mid 80s. She denies chest pain palpitations lower extremity edema. She denies headache dizziness syncope or near-syncope. She denies [name] fever does report frequent productive cough with thick white sputum. She denies abdominal pain nausea vomiting diarrhea constipation melena bright red blood per rectum. She denies dysuria hematuria frequency or urgency. She reports she continues to smoke and she also drinks 2-3 40 ounce beers per day.  Assessment & Plan:   Principal Problem:   Acute respiratory failure with hypoxia (HCC) Active Problems:   Smoker   Automatic implantable cardioverter-defibrillator in situ   COPD with acute exacerbation (HCC)   Alcohol use (HCC)   Essential hypertension   CAD (coronary artery disease)   Acute on chronic systolic heart failure (HCC)   Hyperglycemia   1]  acute hypoxic respiratory failure secondary to acute on chronic systolic heart failure with COPD exacerbation  - upon admission her BNP was greater than thousand - Chest x-ray showed mild interstitial pulmonary edema.   - ABG showed acidemia with CO2 greater than 66.   - Treated for both HF and COPD exacerbation in presence of above with bipap, lasix, nebs, steroids - Seems much improved at this point - Echo notable for decreased EF to 09-98%, grade 1 diastolic dysfunction - Continue lasix, entresto, add spironolactone - With newly decreased EF, will consult cardiology - I/O, daily weights  Wt Readings from Last 3 Encounters:  03/25/18 68.6 kg (151 lb 3.2 oz)  11/03/17 69.6 kg (153 lb 8 oz)  02/24/17 66.4 kg (146 lb 6.4 oz)   2] COPD exacerbation - transition to PO steroids, continue nebs/inhalers.  No wheezing on exam.  3] history of CAD MI and PCI in 2008.  Troponin on 7/15 0.03.  No CP.  Follow-up echo (as noted above).  Continue current cardiac medications.  4] history of alcohol abuse currently on CIWA scale.  No evidence withdrawal.  She reports drinking several bottles of beer daily.  Encouraged cessation.  5] tobacco use nicotine patch ordered.  Discussed smoking cessation.  She's interested in quitting, hasn't been able to before, but has tried.  Would like nicotine patch at discharge.  4 minutes tobacco cessation counseling.    6] hyperglycemia secondary to steroids.  Follow A1c.   DVT prophylaxis: lovenox Code Status: full  Family Communication: daughter, grandchildren at bedside Disposition Plan: pending cards eval   Consultants:   cardiology  Procedures:  Study Conclusions  - Left ventricle: The cavity size  was mildly dilated. Wall   thickness was normal. Systolic function was severely reduced. The   estimated ejection fraction was in the range of 25% to 30%.   Diffuse hypokinesis. Doppler parameters are consistent with   abnormal left ventricular  relaxation (grade 1 diastolic   dysfunction). - Aortic valve: There was mild regurgitation. - Mitral valve: There was mild regurgitation. - Left atrium: The atrium was mildly dilated.  Impressions:  - Severe global reduction in LV systolic function; mild diastolic   dsyfunction; mild LVE; mild AI and MR; mild LAE.  Antimicrobials:  Anti-infectives (From admission, onward)   None      Subjective: Feeling better Essentially back to baseline  Objective: Vitals:   03/25/18 0022 03/25/18 0450 03/25/18 0500 03/25/18 0810  BP: 102/79 106/74  126/88  Pulse: 74 (!) 38 75 83  Resp: 16 (!) 22  16  Temp: 97.8 F (36.6 C) 97.8 F (36.6 C)  97.8 F (36.6 C)  TempSrc: Oral Oral  Oral  SpO2: 93% 95%  94%  Weight:  68.6 kg (151 lb 3.2 oz)    Height:        Intake/Output Summary (Last 24 hours) at 03/25/2018 1211 Last data filed at 03/25/2018 1019 Gross per 24 hour  Intake 240 ml  Output -  Net 240 ml   Filed Weights   03/23/18 0900 03/24/18 0343 03/25/18 0450  Weight: 68.7 kg (151 lb 7.3 oz) 68.2 kg (150 lb 5.7 oz) 68.6 kg (151 lb 3.2 oz)    Examination:  General exam: Appears calm and comfortable  Respiratory system: Clear to auscultation. Respiratory effort normal. Cardiovascular system: S1 & S2 heard, RRR.  Gastrointestinal system: Abdomen is nondistended, soft and nontender. Central nervous system: Alert and oriented. No focal neurological deficits. Extremities: Symmetric 5 x 5 power. Skin: No rashes, lesions or ulcers Psychiatry: Judgement and insight appear normal. Mood & affect appropriate.     Data Reviewed: I have personally reviewed following labs and imaging studies  CBC: Recent Labs  Lab 03/23/18 0424 03/24/18 0449  WBC 9.7 8.5  NEUTROABS 4.4  --   HGB 17.5* 16.9*  HCT 53.6* 50.0*  MCV 100.2* 91.9  PLT 116* 242*   Basic Metabolic Panel: Recent Labs  Lab 03/23/18 0424 03/24/18 0334 03/25/18 0435  NA 138 139 136  K 3.9 4.1 3.5  CL 107 103  100  CO2 18* 26 24  GLUCOSE 172* 139* 122*  BUN 6 12 22*  CREATININE 0.76 0.72 0.78  CALCIUM 9.1 9.0 9.0   GFR: Estimated Creatinine Clearance: 78.2 mL/min (by C-G formula based on SCr of 0.78 mg/dL). Liver Function Tests: Recent Labs  Lab 03/23/18 0424  AST 80*  ALT 43  ALKPHOS 95  BILITOT 1.3*  PROT 7.7  ALBUMIN 3.5   No results for input(s): LIPASE, AMYLASE in the last 168 hours. No results for input(s): AMMONIA in the last 168 hours. Coagulation Profile: No results for input(s): INR, PROTIME in the last 168 hours. Cardiac Enzymes: Recent Labs  Lab 03/23/18 0900  TROPONINI 0.03*   BNP (last 3 results) No results for input(s): PROBNP in the last 8760 hours. HbA1C: No results for input(s): HGBA1C in the last 72 hours. CBG: Recent Labs  Lab 03/23/18 2154 03/24/18 0729 03/24/18 1031 03/24/18 1631 03/25/18 0834  GLUCAP 123* 134* 203* 135* 136*   Lipid Profile: Recent Labs    03/23/18 0900  CHOL 130  HDL 39*  LDLCALC 78  TRIG 66  CHOLHDL 3.3  Thyroid Function Tests: No results for input(s): TSH, T4TOTAL, FREET4, T3FREE, THYROIDAB in the last 72 hours. Anemia Panel: No results for input(s): VITAMINB12, FOLATE, FERRITIN, TIBC, IRON, RETICCTPCT in the last 72 hours. Sepsis Labs: No results for input(s): PROCALCITON, LATICACIDVEN in the last 168 hours.  Recent Results (from the past 240 hour(s))  MRSA PCR Screening     Status: None   Collection Time: 03/23/18  8:59 AM  Result Value Ref Range Status   MRSA by PCR NEGATIVE NEGATIVE Final    Comment:        The GeneXpert MRSA Assay (FDA approved for NASAL specimens only), is one component of [name] comprehensive MRSA colonization surveillance program. It is not intended to diagnose MRSA infection nor to guide or monitor treatment for MRSA infections. Performed at [facility], [place] [address] [place], [place] [address]          Radiology Studies: No results found.      Scheduled  Meds: . aspirin  81 mg Oral Daily  . carvedilol  12.5 mg Oral BID WC  . enoxaparin (LOVENOX) injection  40 mg Subcutaneous Q24H  . folic acid  1 mg Oral Daily  . furosemide  40 mg Intravenous BID  . insulin aspart  0-5 Units Subcutaneous QHS  . insulin aspart  0-9 Units Subcutaneous TID WC  . metoprolol tartrate  5 mg Intravenous Q8H  . mometasone-formoterol  2 puff Inhalation BID  . multivitamin with minerals  1 tablet Oral Daily  . nicotine  14 mg Transdermal Daily  . [START ON 03/26/2018] predniSONE  40 mg Oral Q breakfast  . sacubitril-valsartan  1 tablet Oral BID  . sodium chloride flush  3 mL Intravenous Q12H  . thiamine  100 mg Oral Daily   Or  . thiamine  100 mg Intravenous Daily   Continuous Infusions: . sodium chloride 10 mL/hr at 03/24/18 0600     LOS: 2 days    Time spent: over 30 min    Fayrene Helper, MD [facility] Pager [phone]   If 7PM-7AM, please contact night-coverage [email or web address] Password [facility] 03/25/2018, 12:11 PM

## 2018-03-25 NOTE — H&P (View-Only) (Signed)
Cardiology Consultation:   Patient ID: [name]; [id number]; [birth year]   Admit date: 03/23/2018 Date of Consult: 03/25/2018  Primary Care Provider: Vicenta Aly, [place] Primary Cardiologist: Dr. Johnsie Cancel    Patient Profile:   [name] is a 62 y.o. female with a hx of inferior MI with PCI back in 2008, ischemic CM s/p ICD with subsequent improvement in LV function, ongoing tobacco abuse, COPD, PAD with bilateral SFA occlusions, VT and HLD who is being seen today for the evaluation of acute CHF at the request of Dr. Florene Glen.   EF previouslydown to15% butimproved to 50-55% by echo on 04/2015.  Low risk stress test 03/2015.  Patient was doing well on cardiac standpoint when last seen by Truitt Merle, NP  02/18/2018.  Seen for walk-in visit for hematuria.  He was treated with antibiotic for UTI.  History of Present Illness:   [name] presented 7/15 with 1 day history of shortness of breath, cough and orthopnea.  Denies any chest pain, palpitation, dizziness, PND, lower extremity edema however noted ankle edema, palpitation or melena.  She endorses high salt intake.  No daily weight checkup.  Ongoing tobacco smoking.  She was admitted for acute on chronic respiratory failure in setting of acute CHF and COPD exacerbation.  Treated with BiPAP, nebulizer, steroid and IV Lasix.  She diuresed 1.9 L so far.  Weight stable at 150-151 lb. Her weight was 153lb when last seen by Dr. Johnsie Cancel 10/2017. Patient thinks that her breathing is back to normal.  No regular exercise however walks 1-2 blocks at a time without dyspnea or chest pain.  Creatinine and electrolytes are normal.  Echocardiogram 7/15 showed LV function of 20 to 25% (down from last echocardiogram), grade 1 diastolic dysfunction, mild AI, mild MR and mildly dilated left atrium.  Cardiology  is asked for further treatment and management.  Past Medical History:  Diagnosis Date  . Acute on chronic systolic heart failure ([place])   .  Acute respiratory failure ([place])   . Automatic implantable cardioverter-defibrillator in situ   . CAD (coronary artery disease)    a. Inf-post MI 2008 s/p BMS to large marginal of Cx.   Marland Kitchen COPD (chronic obstructive pulmonary disease) ([place])   . ETOH abuse   . Hyperglycemia   . HYPERLIPIDEMIA-MIXED 02/07/2009  . HYPERTENSION, BENIGN 08/22/2009  . PVD (peripheral vascular disease) ([place])    a. Evaluated by Dr. [name] Anon 08/2012.  Marland Kitchen SYSTOLIC HEART FAILURE, CHRONIC 02/07/2009   a. EF 30% 2010, 29% by MRI 08/2012. b. s/p prophylactic Medtronic ICD implantation 10/2012.  Marland Kitchen TIA (transient ischemic attack) 05/18/2014  . Tobacco abuse   . VENTRICULAR TACHYCARDIA 05/21/2010    Past Surgical History:  Procedure Laterality Date  . CARDIAC DEFIBRILLATOR PLACEMENT  10/2012  . CESAREAN SECTION  1977; 1982  . CYSTOSCOPY W/ STONE MANIPULATION  1990's  . IMPLANTABLE CARDIOVERTER DEFIBRILLATOR IMPLANT N/A 10/21/2012   Procedure: IMPLANTABLE CARDIOVERTER DEFIBRILLATOR IMPLANT;  Surgeon: Evans Lance, MD;  Location: [facility] CATH LAB;  Service: Cardiovascular;  Laterality: N/A;    Inpatient Medications: Scheduled Meds: . aspirin  81 mg Oral Daily  . carvedilol  12.5 mg Oral BID WC  . enoxaparin (LOVENOX) injection  40 mg Subcutaneous Q24H  . folic acid  1 mg Oral Daily  . furosemide  40 mg Intravenous BID  . insulin aspart  0-5 Units Subcutaneous QHS  . insulin aspart  0-9 Units Subcutaneous TID WC  . mometasone-formoterol  2 puff Inhalation BID  .  multivitamin with minerals  1 tablet Oral Daily  . nicotine  14 mg Transdermal Daily  . [START ON 03/26/2018] predniSONE  40 mg Oral Q breakfast  . sacubitril-valsartan  1 tablet Oral BID  . sodium chloride flush  3 mL Intravenous Q12H  . spironolactone  12.5 mg Oral Daily  . thiamine  100 mg Oral Daily   Or  . thiamine  100 mg Intravenous Daily   Continuous Infusions: . sodium chloride 10 mL/hr at 03/24/18 0600   PRN Meds: sodium chloride, acetaminophen,  albuterol, ipratropium-albuterol, LORazepam **OR** LORazepam, ondansetron (ZOFRAN) IV, sodium chloride flush  Allergies:    Allergies  Allergen Reactions  . Clopidogrel Bisulfate Rash    Social History:   Social History   Socioeconomic History  . Marital status: Single    Spouse name: Not on file  . Number of children: Not on file  . Years of education: Not on file  . Highest education level: Not on file  Occupational History  . Not on file  Social Needs  . Financial resource strain: Not on file  . Food insecurity:    Worry: Not on file    Inability: Not on file  . Transportation needs:    Medical: Not on file    Non-medical: Not on file  Tobacco Use  . Smoking status: Current Every Day Smoker    Packs/day: 0.50    Years: 40.00    Pack years: 20.00    Types: Cigarettes  . Smokeless tobacco: Never Used  Substance and Sexual Activity  . Alcohol use: Yes    Alcohol/week: 12.0 oz    Types: 20 Cans of beer per week    Comment: 05/18/2014 "2, 40's qod"  . Drug use: No  . Sexual activity: Yes  Lifestyle  . Physical activity:    Days per week: Not on file    Minutes per session: Not on file  . Stress: Not on file  Relationships  . Social connections:    Talks on phone: Not on file    Gets together: Not on file    Attends religious service: Not on file    Active member of club or organization: Not on file    Attends meetings of clubs or organizations: Not on file    Relationship status: Not on file  . Intimate partner violence:    Fear of current or ex partner: Not on file    Emotionally abused: Not on file    Physically abused: Not on file    Forced sexual activity: Not on file  Other Topics Concern  . Not on file  Social History Narrative  . Not on file    Family History:   Family History  Problem Relation Age of Onset  . Cancer Mother        cervical  . Heart attack Father   . Asthma Brother   . Other Brother        Pacemaker     ROS:  Please see  the history of present illness.  All other ROS reviewed and negative.     Physical Exam/Data:   Vitals:   03/25/18 0450 03/25/18 0500 03/25/18 0810 03/25/18 1215  BP: 106/74  126/88 100/69  Pulse: (!) 38 75 83 70  Resp: (!) 22  16 18   Temp: 97.8 F (36.6 C)  97.8 F (36.6 C) 98.8 F (37.1 C)  TempSrc: Oral  Oral Oral  SpO2: 95%  94% 95%  Weight: 151 lb  3.2 oz (68.6 kg)     Height:        Intake/Output Summary (Last 24 hours) at 03/25/2018 1625 Last data filed at 03/25/2018 1359 Gross per 24 hour  Intake 480 ml  Output 1450 ml  Net -970 ml   Filed Weights   03/23/18 0900 03/24/18 0343 03/25/18 0450  Weight: 151 lb 7.3 oz (68.7 kg) 150 lb 5.7 oz (68.2 kg) 151 lb 3.2 oz (68.6 kg)   Body mass index is 22.33 kg/m.  General:  Well nourished, well developed, in no acute distress HEENT: normal Lymph: no adenopathy Neck: no JVD Endocrine:  No thryomegaly Vascular: No carotid bruits; FA pulses 2+ bilaterally without bruits  Cardiac:  normal S1, S2; RRR; no murmur Lungs:  clear to auscultation bilaterally, no wheezing, rhonchi or rales  Abd: soft, nontender, no hepatomegaly  Ext: no edema Musculoskeletal:  No deformities, BUE and BLE strength normal and equal Skin: warm and dry  Neuro:  CNs 2-12 intact, no focal abnormalities noted Psych:  Normal affect   EKG:  The EKG was personally reviewed and demonstrates:  Sinus tachycardia at rate of 122 bpm Telemetry:  Telemetry was personally reviewed and demonstrates:  SR with PVCS  Relevant CV Studies:  Myoview Study Highlights 03/2015    Nuclear stress EF: 50%.  There was no ST segment deviation noted during stress.  This is a low risk study.  The left ventricular ejection fraction is mildly decreased (45-54%).  Small area of apical and lateral wall infarct no ischemia EF 50%     Echo Study Conclusions 04/2015  - Left ventricle: The cavity size was normal. Wall thickness was normal. Systolic function was  normal. The estimated ejection fraction was in the range of 50% to 55%. There is akinesis of the basal-midinferolateral and inferior myocardium. Doppler parameters are consistent with abnormal left ventricular relaxation (grade 1 diastolic dysfunction). - Aortic valve: There was trivial regurgitation.  Impressions:  - Inferior and inferior lateral akinesis with overall low normal LV function; grade 1 diastolic dysfunction; trace AI and MR.  Echo 03/23/18 Study Conclusions  - Left ventricle: The cavity size was mildly dilated. Wall   thickness was normal. Systolic function was severely reduced. The   estimated ejection fraction was in the range of 25% to 30%.   Diffuse hypokinesis. Doppler parameters are consistent with   abnormal left ventricular relaxation (grade 1 diastolic   dysfunction). - Aortic valve: There was mild regurgitation. - Mitral valve: There was mild regurgitation. - Left atrium: The atrium was mildly dilated.  Impressions:  - Severe global reduction in LV systolic function; mild diastolic   dsyfunction; mild LVE; mild AI and MR; mild LAE.   CARDIAC CATHETERIZATION 09/2007   CLINICAL HISTORY:  [name] is 62 year old and in June of 2008 had a  posterior wall myocardial infarction treated with a bare metal stent to  the circumflex artery.  She had Plavix allergy and was treated with  Ticlid for a month.  I saw her recently for a preoperative evaluation  prior to gallbladder surgery by Dr. Johney Maine.  We did a Myoview scan which  showed inferior scar with suggestion of peri scar ischemia and ejection  fraction of 36%.  Ejection fraction at cath with estimated at 60%.  For  this reason we brought her in for angiography.   PROCEDURE:  The procedure was performed via the right femoral artery  using an arterial sheath and 6-French preformed coronary catheters.  A  front  wall arterial puncture was performed and Omnipaque contrast was  used.  The  patient tolerated the procedure well and left the laboratory  in satisfactory condition.  The right femoral artery was closed with  Angio-Seal at the end of the procedure.   RESULTS:  Left main coronary artery:  The left main coronary artery was  free of significant disease.  Left anterior descending artery:  The left anterior descending artery  gave rise to two diagonal branches and two septal perforators.  There  was 50% narrowing in a subbranch of the first diagonal branch.  The  other vessels were free of significant obstruction.  Circumflex artery:  The circumflex artery gave rise to two atrial  branches, two small marginal branches and a large and small  posterolateral branch.  There was 30% narrowing within the stent in the  proximal to mid vessel.  Right coronary artery:  The right coronary artery was a moderate-sized  vessel that gave rise to a conus branch, right ventricular branch,  posterior descending branch and two posterolateral branches.  There was  30% narrowing in the proximal portion of the right coronary artery.  Left ventriculogram:  The left ventriculogram performed in the RAO  projection showed global hypokinesis with an estimated ejection fraction  of 35-40%.  Left ventriculogram:  Left ventriculogram performed in the LAO  projection showed akinesis of the posterior wall.  The septum and tip of  the apex moved well.   HEMODYNAMIC DATA:  Aortic pressure was 163/86 with a mean of 117.  The  left ventricular pressure was 163/22.   CONCLUSION:  1. Coronary artery disease status post prior posterior wall myocardial      infarction treated with stenting of the circumflex artery with 30%      narrowing at the stent site in the circumflex artery, 50% narrowing      in a subbranch of a diagonal branch of the LAD, 30% narrowing in      the proximal right coronary artery.  2. Left ventricular dysfunction with posterior wall akinesis and      global hypokinesis and  estimated ejection fraction of 35-40%.   RECOMMENDATIONS:  The stent is widely patent.  The reason for the  reduced LV function is not clear.  Will plan to treat the patient with  an ACE inhibitor and beta-blocker.  I think she will be okay to proceed  with the surgery.  Will also reevaluate her LV function with an  echocardiogram.   Laboratory Data:  Chemistry Recent Labs  Lab 03/23/18 0424 03/24/18 0334 03/25/18 0435  NA 138 139 136  K 3.9 4.1 3.5  CL 107 103 100  CO2 18* 26 24  GLUCOSE 172* 139* 122*  BUN 6 12 22*  CREATININE 0.76 0.72 0.78  CALCIUM 9.1 9.0 9.0  GFRNONAA >60 >60 >60  GFRAA >60 >60 >60  ANIONGAP 13 10 12     Recent Labs  Lab 03/23/18 0424  PROT 7.7  ALBUMIN 3.5  AST 80*  ALT 43  ALKPHOS 95  BILITOT 1.3*   Hematology Recent Labs  Lab 03/23/18 0424 03/24/18 0449  WBC 9.7 8.5  RBC 5.51* 5.44*  HGB 17.5* 16.9*  HCT 53.6* 50.0*  MCV 100.2* 91.9  MCH 31.8 31.1  MCHC 31.7 33.8  RDW 13.3 13.0  PLT 116* 112*   Cardiac Enzymes Recent Labs  Lab 03/23/18 0900  TROPONINI 0.03*    Recent Labs  Lab 03/23/18 0429  TROPIPOC 0.00  BNP Recent Labs  Lab 03/23/18 0424  BNP 1,069.6*   Radiology/Studies:  Dg Chest Port 1 View  Result Date: 03/23/2018 CLINICAL DATA:  Shortness of breath EXAM: PORTABLE CHEST 1 VIEW COMPARISON:  11/05/2017 FINDINGS: Left chest wall single lead AICD is unchanged. There is mild interstitial pulmonary edema, worsened compared to the prior study. Mild cardiomegaly. No focal consolidation. No pleural effusion or pneumothorax. IMPRESSION: Cardiomegaly and mild interstitial pulmonary edema. Electronically Signed   By: Ulyses Jarred M.D.   On: 03/23/2018 05:16   Assessment and Plan:   1. Acute on chronic systolic and diastolic heart failure - BNP 1069 on admission.  Chest x-ray with interstitial pulmonary edema.  She was diuresed almost 2 L.  Weight has been stable.  Euvolemic by exam.  - Echocardiogram 7/15 showed  LV function of 20 to 25% (down from last echocardiogram 50-55% in 2016), grade 1 diastolic dysfunction, mild AI, mild MR and mildly dilated left atrium. -Last stress test low risk in 2016. -Given new LV dysfunction patient will need ischemic evaluation.  She also endorsed eating high sodium diet.  Education given.  -Continue Coreg, Entresto and Spironolactone (new this admission).  Lasix per MD>>> consider changing to po or hold. She will need heart failure education.   2. CAD s/p stenting to Cx -Low risk stress test in 2016.  Now has newly LV dysfunction.  Patient denies chest pain or exertional dyspnea. -Continue aspirin, Coreg 12.5 mg twice daily  3.  Tobacco and alcohol abuse -Advised cessation  4.  Acute hypoxic respiratory failure secondary to acute CHF and COPD exasperation -Breathing back to normal.  Per primary team  5. HLD - 03/23/2018: Cholesterol 130; HDL 39; LDL Cholesterol 78; Triglycerides 66; VLDL 13  - Consider adding low dose statin. LDL goal less than 70.  6. PVD with bilateral SFA occlusions - Followed by Dr. [name] Anon   For questions or updates, please contact [facility] Please consult [email or web address] for contact info under Cardiology/STEMI.   [place], [place]  03/25/2018 4:25 PM

## 2018-03-25 NOTE — Plan of Care (Signed)
  Problem: Pain Managment: Goal: General experience of comfort will improve Outcome: Not Progressing Note:  Patient is alert tearful with oxy ir added to regime with no relieve.    Problem: Activity: Goal: Capacity to carry out activities will improve Note:  Up to bathroom gave IS to for ineffective breathing d/t pain

## 2018-03-25 NOTE — Consult Note (Signed)
Cardiology Consultation:   Patient ID: JAYDALYN DEMATTIA; [id number]; [birth year]   Admit date: 03/23/2018 Date of Consult: 03/25/2018  Primary Care Provider: Vicenta Aly, [place] Primary Cardiologist: Dr. Johnsie Cancel    Patient Profile:   [name] is a 62 y.o. female with a hx of inferior MI with PCI back in 2008, ischemic CM s/p ICD with subsequent improvement in LV function, ongoing tobacco abuse, COPD, PAD with bilateral SFA occlusions, VT and HLD who is being seen today for the evaluation of acute CHF at the request of Dr. Florene Glen.   EF previouslydown to15% butimproved to 50-55% by echo on 04/2015.  Low risk stress test 03/2015.  Patient was doing well on cardiac standpoint when last seen by Truitt Merle, NP  02/18/2018.  Seen for walk-in visit for hematuria.  He was treated with antibiotic for UTI.  History of Present Illness:   [name] presented 7/15 with 1 day history of shortness of breath, cough and orthopnea.  Denies any chest pain, palpitation, dizziness, PND, lower extremity edema however noted ankle edema, palpitation or melena.  She endorses high salt intake.  No daily weight checkup.  Ongoing tobacco smoking.  She was admitted for acute on chronic respiratory failure in setting of acute CHF and COPD exacerbation.  Treated with BiPAP, nebulizer, steroid and IV Lasix.  She diuresed 1.9 L so far.  Weight stable at 150-151 lb. Her weight was 153lb when last seen by Dr. Johnsie Cancel 10/2017. Patient thinks that her breathing is back to normal.  No regular exercise however walks 1-2 blocks at a time without dyspnea or chest pain.  Creatinine and electrolytes are normal.  Echocardiogram 7/15 showed LV function of 20 to 25% (down from last echocardiogram), grade 1 diastolic dysfunction, mild AI, mild MR and mildly dilated left atrium.  Cardiology  is asked for further treatment and management.  Past Medical History:  Diagnosis Date  . Acute on chronic systolic heart failure ([place])   .  Acute respiratory failure ([place])   . Automatic implantable cardioverter-defibrillator in situ   . CAD (coronary artery disease)    a. Inf-post MI 2008 s/p BMS to large marginal of Cx.   Marland Kitchen COPD (chronic obstructive pulmonary disease) ([place])   . ETOH abuse   . Hyperglycemia   . HYPERLIPIDEMIA-MIXED 02/07/2009  . HYPERTENSION, BENIGN 08/22/2009  . PVD (peripheral vascular disease) ([place])    a. Evaluated by Dr. [name] Anon 08/2012.  Marland Kitchen SYSTOLIC HEART FAILURE, CHRONIC 02/07/2009   a. EF 30% 2010, 29% by MRI 08/2012. b. s/p prophylactic Medtronic ICD implantation 10/2012.  Marland Kitchen TIA (transient ischemic attack) 05/18/2014  . Tobacco abuse   . VENTRICULAR TACHYCARDIA 05/21/2010    Past Surgical History:  Procedure Laterality Date  . CARDIAC DEFIBRILLATOR PLACEMENT  10/2012  . CESAREAN SECTION  1977; 1982  . CYSTOSCOPY W/ STONE MANIPULATION  1990's  . IMPLANTABLE CARDIOVERTER DEFIBRILLATOR IMPLANT N/A 10/21/2012   Procedure: IMPLANTABLE CARDIOVERTER DEFIBRILLATOR IMPLANT;  Surgeon: Evans Lance, MD;  Location: [facility] CATH LAB;  Service: Cardiovascular;  Laterality: N/A;    Inpatient Medications: Scheduled Meds: . aspirin  81 mg Oral Daily  . carvedilol  12.5 mg Oral BID WC  . enoxaparin (LOVENOX) injection  40 mg Subcutaneous Q24H  . folic acid  1 mg Oral Daily  . furosemide  40 mg Intravenous BID  . insulin aspart  0-5 Units Subcutaneous QHS  . insulin aspart  0-9 Units Subcutaneous TID WC  . mometasone-formoterol  2 puff Inhalation BID  .  multivitamin with minerals  1 tablet Oral Daily  . nicotine  14 mg Transdermal Daily  . [START ON 03/26/2018] predniSONE  40 mg Oral Q breakfast  . sacubitril-valsartan  1 tablet Oral BID  . sodium chloride flush  3 mL Intravenous Q12H  . spironolactone  12.5 mg Oral Daily  . thiamine  100 mg Oral Daily   Or  . thiamine  100 mg Intravenous Daily   Continuous Infusions: . sodium chloride 10 mL/hr at 03/24/18 0600   PRN Meds: sodium chloride, acetaminophen,  albuterol, ipratropium-albuterol, LORazepam **OR** LORazepam, ondansetron (ZOFRAN) IV, sodium chloride flush  Allergies:    Allergies  Allergen Reactions  . Clopidogrel Bisulfate Rash    Social History:   Social History   Socioeconomic History  . Marital status: Single    Spouse name: Not on file  . Number of children: Not on file  . Years of education: Not on file  . Highest education level: Not on file  Occupational History  . Not on file  Social Needs  . Financial resource strain: Not on file  . Food insecurity:    Worry: Not on file    Inability: Not on file  . Transportation needs:    Medical: Not on file    Non-medical: Not on file  Tobacco Use  . Smoking status: Current Every Day Smoker    Packs/day: 0.50    Years: 40.00    Pack years: 20.00    Types: Cigarettes  . Smokeless tobacco: Never Used  Substance and Sexual Activity  . Alcohol use: Yes    Alcohol/week: 12.0 oz    Types: 20 Cans of beer per week    Comment: 05/18/2014 "2, 40's qod"  . Drug use: No  . Sexual activity: Yes  Lifestyle  . Physical activity:    Days per week: Not on file    Minutes per session: Not on file  . Stress: Not on file  Relationships  . Social connections:    Talks on phone: Not on file    Gets together: Not on file    Attends religious service: Not on file    Active member of club or organization: Not on file    Attends meetings of clubs or organizations: Not on file    Relationship status: Not on file  . Intimate partner violence:    Fear of current or ex partner: Not on file    Emotionally abused: Not on file    Physically abused: Not on file    Forced sexual activity: Not on file  Other Topics Concern  . Not on file  Social History Narrative  . Not on file    Family History:   Family History  Problem Relation Age of Onset  . Cancer Mother        cervical  . Heart attack Father   . Asthma Brother   . Other Brother        Pacemaker     ROS:  Please see  the history of present illness.  All other ROS reviewed and negative.     Physical Exam/Data:   Vitals:   03/25/18 0450 03/25/18 0500 03/25/18 0810 03/25/18 1215  BP: 106/74  126/88 100/69  Pulse: (!) 38 75 83 70  Resp: (!) 22  16 18   Temp: 97.8 F (36.6 C)  97.8 F (36.6 C) 98.8 F (37.1 C)  TempSrc: Oral  Oral Oral  SpO2: 95%  94% 95%  Weight: 151 lb  3.2 oz (68.6 kg)     Height:        Intake/Output Summary (Last 24 hours) at 03/25/2018 1625 Last data filed at 03/25/2018 1359 Gross per 24 hour  Intake 480 ml  Output 1450 ml  Net -970 ml   Filed Weights   03/23/18 0900 03/24/18 0343 03/25/18 0450  Weight: 151 lb 7.3 oz (68.7 kg) 150 lb 5.7 oz (68.2 kg) 151 lb 3.2 oz (68.6 kg)   Body mass index is 22.33 kg/m.  General:  Well nourished, well developed, in no acute distress HEENT: normal Lymph: no adenopathy Neck: no JVD Endocrine:  No thryomegaly Vascular: No carotid bruits; FA pulses 2+ bilaterally without bruits  Cardiac:  normal S1, S2; RRR; no murmur Lungs:  clear to auscultation bilaterally, no wheezing, rhonchi or rales  Abd: soft, nontender, no hepatomegaly  Ext: no edema Musculoskeletal:  No deformities, BUE and BLE strength normal and equal Skin: warm and dry  Neuro:  CNs 2-12 intact, no focal abnormalities noted Psych:  Normal affect   EKG:  The EKG was personally reviewed and demonstrates:  Sinus tachycardia at rate of 122 bpm Telemetry:  Telemetry was personally reviewed and demonstrates:  SR with PVCS  Relevant CV Studies:  Myoview Study Highlights 03/2015    Nuclear stress EF: 50%.  There was no ST segment deviation noted during stress.  This is a low risk study.  The left ventricular ejection fraction is mildly decreased (45-54%).  Small area of apical and lateral wall infarct no ischemia EF 50%     Echo Study Conclusions 04/2015  - Left ventricle: The cavity size was normal. Wall thickness was normal. Systolic function was  normal. The estimated ejection fraction was in the range of 50% to 55%. There is akinesis of the basal-midinferolateral and inferior myocardium. Doppler parameters are consistent with abnormal left ventricular relaxation (grade 1 diastolic dysfunction). - Aortic valve: There was trivial regurgitation.  Impressions:  - Inferior and inferior lateral akinesis with overall low normal LV function; grade 1 diastolic dysfunction; trace AI and MR.  Echo 03/23/18 Study Conclusions  - Left ventricle: The cavity size was mildly dilated. Wall   thickness was normal. Systolic function was severely reduced. The   estimated ejection fraction was in the range of 25% to 30%.   Diffuse hypokinesis. Doppler parameters are consistent with   abnormal left ventricular relaxation (grade 1 diastolic   dysfunction). - Aortic valve: There was mild regurgitation. - Mitral valve: There was mild regurgitation. - Left atrium: The atrium was mildly dilated.  Impressions:  - Severe global reduction in LV systolic function; mild diastolic   dsyfunction; mild LVE; mild AI and MR; mild LAE.   CARDIAC CATHETERIZATION 09/2007   CLINICAL HISTORY:  [name] is 62 year old and in June of 2008 had a  posterior wall myocardial infarction treated with a bare metal stent to  the circumflex artery.  She had Plavix allergy and was treated with  Ticlid for a month.  I saw her recently for a preoperative evaluation  prior to gallbladder surgery by Dr. Johney Maine.  We did a Myoview scan which  showed inferior scar with suggestion of peri scar ischemia and ejection  fraction of 36%.  Ejection fraction at cath with estimated at 60%.  For  this reason we brought her in for angiography.   PROCEDURE:  The procedure was performed via the right femoral artery  using an arterial sheath and 6-French preformed coronary catheters.  A  front  wall arterial puncture was performed and Omnipaque contrast was  used.  The  patient tolerated the procedure well and left the laboratory  in satisfactory condition.  The right femoral artery was closed with  Angio-Seal at the end of the procedure.   RESULTS:  Left main coronary artery:  The left main coronary artery was  free of significant disease.  Left anterior descending artery:  The left anterior descending artery  gave rise to two diagonal branches and two septal perforators.  There  was 50% narrowing in a subbranch of the first diagonal branch.  The  other vessels were free of significant obstruction.  Circumflex artery:  The circumflex artery gave rise to two atrial  branches, two small marginal branches and a large and small  posterolateral branch.  There was 30% narrowing within the stent in the  proximal to mid vessel.  Right coronary artery:  The right coronary artery was a moderate-sized  vessel that gave rise to a conus branch, right ventricular branch,  posterior descending branch and two posterolateral branches.  There was  30% narrowing in the proximal portion of the right coronary artery.  Left ventriculogram:  The left ventriculogram performed in the RAO  projection showed global hypokinesis with an estimated ejection fraction  of 35-40%.  Left ventriculogram:  Left ventriculogram performed in the LAO  projection showed akinesis of the posterior wall.  The septum and tip of  the apex moved well.   HEMODYNAMIC DATA:  Aortic pressure was 163/86 with a mean of 117.  The  left ventricular pressure was 163/22.   CONCLUSION:  1. Coronary artery disease status post prior posterior wall myocardial      infarction treated with stenting of the circumflex artery with 30%      narrowing at the stent site in the circumflex artery, 50% narrowing      in a subbranch of a diagonal branch of the LAD, 30% narrowing in      the proximal right coronary artery.  2. Left ventricular dysfunction with posterior wall akinesis and      global hypokinesis and  estimated ejection fraction of 35-40%.   RECOMMENDATIONS:  The stent is widely patent.  The reason for the  reduced LV function is not clear.  Will plan to treat the patient with  an ACE inhibitor and beta-blocker.  I think she will be okay to proceed  with the surgery.  Will also reevaluate her LV function with an  echocardiogram.   Laboratory Data:  Chemistry Recent Labs  Lab 03/23/18 0424 03/24/18 0334 03/25/18 0435  NA 138 139 136  K 3.9 4.1 3.5  CL 107 103 100  CO2 18* 26 24  GLUCOSE 172* 139* 122*  BUN 6 12 22*  CREATININE 0.76 0.72 0.78  CALCIUM 9.1 9.0 9.0  GFRNONAA >60 >60 >60  GFRAA >60 >60 >60  ANIONGAP 13 10 12     Recent Labs  Lab 03/23/18 0424  PROT 7.7  ALBUMIN 3.5  AST 80*  ALT 43  ALKPHOS 95  BILITOT 1.3*   Hematology Recent Labs  Lab 03/23/18 0424 03/24/18 0449  WBC 9.7 8.5  RBC 5.51* 5.44*  HGB 17.5* 16.9*  HCT 53.6* 50.0*  MCV 100.2* 91.9  MCH 31.8 31.1  MCHC 31.7 33.8  RDW 13.3 13.0  PLT 116* 112*   Cardiac Enzymes Recent Labs  Lab 03/23/18 0900  TROPONINI 0.03*    Recent Labs  Lab 03/23/18 0429  TROPIPOC 0.00  BNP Recent Labs  Lab 03/23/18 0424  BNP 1,069.6*   Radiology/Studies:  Dg Chest Port 1 View  Result Date: 03/23/2018 CLINICAL DATA:  Shortness of breath EXAM: PORTABLE CHEST 1 VIEW COMPARISON:  11/05/2017 FINDINGS: Left chest wall single lead AICD is unchanged. There is mild interstitial pulmonary edema, worsened compared to the prior study. Mild cardiomegaly. No focal consolidation. No pleural effusion or pneumothorax. IMPRESSION: Cardiomegaly and mild interstitial pulmonary edema. Electronically Signed   By: Ulyses Jarred M.D.   On: 03/23/2018 05:16   Assessment and Plan:   1. Acute on chronic systolic and diastolic heart failure - BNP 1069 on admission.  Chest x-ray with interstitial pulmonary edema.  She was diuresed almost 2 L.  Weight has been stable.  Euvolemic by exam.  - Echocardiogram 7/15 showed  LV function of 20 to 25% (down from last echocardiogram 50-55% in 2016), grade 1 diastolic dysfunction, mild AI, mild MR and mildly dilated left atrium. -Last stress test low risk in 2016. -Given new LV dysfunction patient will need ischemic evaluation.  She also endorsed eating high sodium diet.  Education given.  -Continue Coreg, Entresto and Spironolactone (new this admission).  Lasix per MD>>> consider changing to po or hold. She will need heart failure education.   2. CAD s/p stenting to Cx -Low risk stress test in 2016.  Now has newly LV dysfunction.  Patient denies chest pain or exertional dyspnea. -Continue aspirin, Coreg 12.5 mg twice daily  3.  Tobacco and alcohol abuse -Advised cessation  4.  Acute hypoxic respiratory failure secondary to acute CHF and COPD exasperation -Breathing back to normal.  Per primary team  5. HLD - 03/23/2018: Cholesterol 130; HDL 39; LDL Cholesterol 78; Triglycerides 66; VLDL 13  - Consider adding low dose statin. LDL goal less than 70.  6. PVD with bilateral SFA occlusions - Followed by Dr. [name] Anon   For questions or updates, please contact [facility] Please consult [email or web address] for contact info under Cardiology/STEMI.   [place] Longest [place], [place]  03/25/2018 4:25 PM

## 2018-03-26 ENCOUNTER — Encounter (HOSPITAL_COMMUNITY): Payer: Self-pay | Admitting: Internal Medicine

## 2018-03-26 ENCOUNTER — Encounter (HOSPITAL_COMMUNITY)
Admission: EM | Disposition: A | Discharge status: Home / Self Care | Payer: Self-pay | Source: Home / Self Care | Attending: Family Medicine

## 2018-03-26 DIAGNOSIS — Z720 Tobacco use: Secondary | ICD-10-CM

## 2018-03-26 HISTORY — PX: RIGHT/LEFT HEART CATH AND CORONARY ANGIOGRAPHY: CATH118266

## 2018-03-26 LAB — COMPREHENSIVE METABOLIC PANEL
ALBUMIN: 3 g/dL — AB (ref 3.5–5.0)
ALT: 36 U/L (ref 0–44)
AST: 41 U/L (ref 15–41)
Alkaline Phosphatase: 94 U/L (ref 38–126)
Anion gap: 9 (ref 5–15)
BILIRUBIN TOTAL: 0.7 mg/dL (ref 0.3–1.2)
BUN: 25 mg/dL — ABNORMAL HIGH (ref 6–20)
CHLORIDE: 98 mmol/L (ref 98–111)
CO2: 27 mmol/L (ref 22–32)
Calcium: 9 mg/dL (ref 8.9–10.3)
Creatinine, Ser: 0.79 mg/dL (ref 0.44–1.00)
GFR calc Af Amer: >60 mL/min (ref >60)
GFR calc non Af Amer: >60 mL/min (ref >60)
GLUCOSE: 100 mg/dL — AB (ref 70–99)
POTASSIUM: 3.6 mmol/L (ref 3.5–5.1)
SODIUM: 134 mmol/L — AB (ref 135–145)
TOTAL PROTEIN: 6.7 g/dL (ref 6.5–8.1)

## 2018-03-26 LAB — POCT I-STAT 3, VENOUS BLOOD GAS (G3P V)
Bicarbonate: 26.1 mmol/L (ref 20.0–28.0)
Bicarbonate: 26.6 mmol/L (ref 20.0–28.0)
O2 SAT: 67 %
O2 Saturation: 66 %
PCO2 VEN: 48.7 mmHg (ref 44.0–60.0)
PO2 VEN: 37 mmHg (ref 32.0–45.0)
TCO2: 28 mmol/L (ref 22–32)
TCO2: 28 mmol/L (ref 22–32)
pCO2, Ven: 47.9 mmHg (ref 44.0–60.0)
pH, Ven: 7.345 (ref 7.250–7.430)
pH, Ven: 7.345 (ref 7.250–7.430)
pO2, Ven: 37 mmHg (ref 32.0–45.0)

## 2018-03-26 LAB — CBC
HEMATOCRIT: 54.1 % — AB (ref 36.0–46.0)
HEMOGLOBIN: 18.1 g/dL — AB (ref 12.0–15.0)
MCH: 31.3 pg (ref 26.0–34.0)
MCHC: 33.5 g/dL (ref 30.0–36.0)
MCV: 93.6 fL (ref 78.0–100.0)
Platelets: 124 10*3/uL — ABNORMAL LOW (ref 150–400)
RBC: 5.78 MIL/uL — AB (ref 3.87–5.11)
RDW: 13.3 % (ref 11.5–15.5)
WBC: 13.5 10*3/uL — ABNORMAL HIGH (ref 4.0–10.5)

## 2018-03-26 LAB — GLUCOSE, CAPILLARY
GLUCOSE-CAPILLARY: 121 mg/dL — AB (ref 70–99)
GLUCOSE-CAPILLARY: 125 mg/dL — AB (ref 70–99)
GLUCOSE-CAPILLARY: 111 mg/dL — AB (ref 70–99)
Glucose-Capillary: 121 mg/dL — ABNORMAL HIGH (ref 70–99)
Glucose-Capillary: 110 mg/dL — ABNORMAL HIGH (ref 70–99)

## 2018-03-26 LAB — POCT I-STAT 3, ART BLOOD GAS (G3+)
Acid-base deficit: 2 mmol/L (ref 0.0–2.0)
Bicarbonate: 23.4 mmol/L (ref 20.0–28.0)
O2 Saturation: 96 %
PH ART: 7.368 (ref 7.350–7.450)
TCO2: 25 mmol/L (ref 22–32)
pCO2 arterial: 40.7 mmHg (ref 32.0–48.0)
pO2, Arterial: 83 mmHg (ref 83.0–108.0)

## 2018-03-26 LAB — SURGICAL PCR SCREEN
MRSA, PCR: NEGATIVE
STAPHYLOCOCCUS AUREUS: NEGATIVE

## 2018-03-26 LAB — MAGNESIUM: Magnesium: 1.9 mg/dL (ref 1.7–2.4)

## 2018-03-26 SURGERY — RIGHT/LEFT HEART CATH AND CORONARY ANGIOGRAPHY
Anesthesia: LOCAL

## 2018-03-26 MED ORDER — HEPARIN (PORCINE) IN NACL 1000-0.9 UT/500ML-% IV SOLN
INTRAVENOUS | Status: AC
  Filled 2018-03-26: qty 1000

## 2018-03-26 MED ORDER — MIDAZOLAM HCL 2 MG/2ML IJ SOLN
INTRAMUSCULAR | Status: DC | PRN
  Administered 2018-03-26: 0.5 mg via INTRAVENOUS

## 2018-03-26 MED ORDER — LIDOCAINE HCL (PF) 1 % IJ SOLN
INTRAMUSCULAR | Status: AC
  Filled 2018-03-26: qty 30

## 2018-03-26 MED ORDER — FENTANYL CITRATE (PF) 100 MCG/2ML IJ SOLN
INTRAMUSCULAR | Status: AC
  Filled 2018-03-26: qty 2

## 2018-03-26 MED ORDER — SODIUM CHLORIDE 0.9 % IV BOLUS
250.0000 mL | Freq: Once | INTRAVENOUS | Status: AC
  Administered 2018-03-26: 250 mL via INTRAVENOUS

## 2018-03-26 MED ORDER — HEPARIN SODIUM (PORCINE) 1000 UNIT/ML IJ SOLN
INTRAMUSCULAR | Status: AC
  Filled 2018-03-26: qty 1

## 2018-03-26 MED ORDER — ENOXAPARIN SODIUM 40 MG/0.4ML ~~LOC~~ SOLN
40.0000 mg | SUBCUTANEOUS | Status: DC
  Administered 2018-03-27: 40 mg via SUBCUTANEOUS
  Filled 2018-03-26: qty 0.4

## 2018-03-26 MED ORDER — HEPARIN (PORCINE) IN NACL 2-0.9 UNITS/ML
INTRAMUSCULAR | Status: DC | PRN
  Administered 2018-03-26: 10 mL via INTRA_ARTERIAL

## 2018-03-26 MED ORDER — FENTANYL CITRATE (PF) 100 MCG/2ML IJ SOLN
INTRAMUSCULAR | Status: DC | PRN
  Administered 2018-03-26: 25 ug via INTRAVENOUS

## 2018-03-26 MED ORDER — SODIUM CHLORIDE 0.9 % IV SOLN: 250.0000 mL | INTRAVENOUS | Status: DC | PRN

## 2018-03-26 MED ORDER — SACUBITRIL-VALSARTAN 97-103 MG PO TABS
1.0000 | ORAL_TABLET | Freq: Two times a day (BID) | ORAL | Status: DC
  Administered 2018-03-26 – 2018-03-27 (×2): 1 via ORAL
  Filled 2018-03-26 (×2): qty 1

## 2018-03-26 MED ORDER — VERAPAMIL HCL 2.5 MG/ML IV SOLN
INTRAVENOUS | Status: AC
  Filled 2018-03-26: qty 2

## 2018-03-26 MED ORDER — MIDAZOLAM HCL 2 MG/2ML IJ SOLN
INTRAMUSCULAR | Status: AC
  Filled 2018-03-26: qty 2

## 2018-03-26 MED ORDER — CARVEDILOL 12.5 MG PO TABS
12.5000 mg | ORAL_TABLET | Freq: Two times a day (BID) | ORAL | Status: DC
  Administered 2018-03-27: 12.5 mg via ORAL
  Filled 2018-03-26: qty 1

## 2018-03-26 MED ORDER — IOHEXOL 350 MG/ML SOLN
INTRAVENOUS | Status: DC | PRN
  Administered 2018-03-26: 40 mL via INTRA_ARTERIAL

## 2018-03-26 MED ORDER — SODIUM CHLORIDE 0.9% FLUSH: 3.0000 mL | INTRAVENOUS | Status: DC | PRN

## 2018-03-26 MED ORDER — SODIUM CHLORIDE 0.9 % IV SOLN: INTRAVENOUS | Status: AC

## 2018-03-26 MED ORDER — LIDOCAINE HCL (PF) 1 % IJ SOLN
INTRAMUSCULAR | Status: DC | PRN
  Administered 2018-03-26 (×2): 2 mL via SUBCUTANEOUS

## 2018-03-26 MED ORDER — SODIUM CHLORIDE 0.9% FLUSH
3.0000 mL | Freq: Two times a day (BID) | INTRAVENOUS | Status: DC
  Administered 2018-03-27: 3 mL via INTRAVENOUS

## 2018-03-26 MED ORDER — HEPARIN SODIUM (PORCINE) 1000 UNIT/ML IJ SOLN
INTRAMUSCULAR | Status: DC | PRN
  Administered 2018-03-26: 3500 [IU] via INTRAVENOUS

## 2018-03-26 SURGICAL SUPPLY — 13 items
CATH 5FR JL3.5 JR4 ANG PIG MP (CATHETERS) ×2 IMPLANT
CATH BALLN WEDGE 5F 110CM (CATHETERS) ×2 IMPLANT
COVER PRB 48X5XTLSCP FOLD TPE (BAG) ×1 IMPLANT
COVER PROBE 5X48 (BAG) ×2
DEVICE RAD COMP TR BAND LRG (VASCULAR PRODUCTS) ×2 IMPLANT
GLIDESHEATH SLEND SS 6F .021 (SHEATH) ×2 IMPLANT
GUIDEWIRE INQWIRE 1.5J.035X260 (WIRE) ×1 IMPLANT
INQWIRE 1.5J .035X260CM (WIRE) ×2
KIT HEART LEFT (KITS) ×2 IMPLANT
PACK CARDIAC CATHETERIZATION (CUSTOM PROCEDURE TRAY) ×2 IMPLANT
SHEATH GLIDE SLENDER 4/5FR (SHEATH) ×2 IMPLANT
TRANSDUCER W/STOPCOCK (MISCELLANEOUS) ×2 IMPLANT
TUBING CIL FLEX 10 FLL-RA (TUBING) ×2 IMPLANT

## 2018-03-26 NOTE — Progress Notes (Signed)
Per Dr. Florene Glen, hold blood pressure medications if SBP < 105.

## 2018-03-26 NOTE — Progress Notes (Signed)
After 250 bolus, BP still 82/56.  Per Dr. Johnsie Cancel, give second bolus if blood pressure still below 90 systolic.  Order placed. Will continue to monitor.

## 2018-03-26 NOTE — Progress Notes (Signed)
PROGRESS NOTE    [name]  TDD:[id number] DOB: 1956-01-28 DOA: 03/23/2018 PCP: Vicenta Aly, FNP   Brief Narrative:  62 y.o.femalewith medical history significantforCAD IMI 2008 with PCI , Ischemic DCM with AICD EF improved by last echo 50% 2016 Smoker with COPD, COPD not on home oxygen, EtOH abuse, tobacco abuse,[name], peripheral vascular disease, hypertension, persistent emergency Department chief complaint shortness of breath. Initial evaluation reveals acute respiratory failure secondary to acute on chronic heart failure in the setting of COPD exacerbation. [facility] are asked to admit  Information is obtained from the patient and her children who are at the bedside.patient reports she is in her usual state of health until 2 days ago she developed gradual worsening shortness of breath. She is unsure of what the trigger was but she did spend some time outdoors yesterday. She uses her home inhaler without improvement. She reports compliance with all her other medications. Past night she could not catch her breath so she called 911. Reportedly EMS documented oxygen saturation level the mid 80s. She denies chest pain palpitations lower extremity edema. She denies headache dizziness syncope or near-syncope. She denies [name] fever does report frequent productive cough with thick white sputum. She denies abdominal pain nausea vomiting diarrhea constipation melena bright red blood per rectum. She denies dysuria hematuria frequency or urgency. She reports she continues to smoke and she also drinks 2-3 40 ounce beers per day.  Assessment & Plan:   Principal Problem:   Acute respiratory failure with hypoxia (HCC) Active Problems:   Smoker   Automatic implantable cardioverter-defibrillator in situ   COPD with acute exacerbation (HCC)   Alcohol use (HCC)   Essential hypertension   CAD (coronary artery disease)   Acute on chronic systolic heart failure (HCC)   Hyperglycemia   1]  acute hypoxic respiratory failure secondary to acute on chronic systolic heart failure with COPD exacerbation  - upon admission her BNP was greater than thousand - Chest x-ray showed mild interstitial pulmonary edema.   - ABG showed acidemia with CO2 greater than 66.   - Treated for both HF and COPD exacerbation in presence of above with bipap, lasix, nebs, steroids - Seems much improved at this point - Echo notable for decreased EF to 62-37%, grade 1 diastolic dysfunction - Continue entresto and coreg, add spironolactone.  Holding lasix for now. - With newly decreased EF, will consult cardiology -> s/p cath with mild nonobstrutive CAD and patent stent to LCx with mild ISR.  Recommended goal directed medical therapy for systolic HF.  Indefinite single antiplatelet therapy. - I/O, daily weights  Wt Readings from Last 3 Encounters:  03/26/18 68 kg (150 lb)  11/03/17 69.6 kg (153 lb 8 oz)  02/24/17 66.4 kg (146 lb 6.4 oz)   # Hypotension:  After cath, has improved after boluses.  Continue to monitor.  Ok to get PM doses of antihypertensives if BP > 628 systolic, discussed with nursing.  2] COPD exacerbation -Will d/c steroids, she continues to have no wheezing suggestive of ongoing COPD exacerbation, continue nebs/inhalers.  No wheezing on exam.  3] history of CAD MI and PCI in 2008.  Troponin on 7/15 0.03.  No CP.  Follow-up echo (as noted above).  Continue current cardiac medications.  4] history of alcohol abuse currently on CIWA scale.  No evidence withdrawal.  She reports drinking several bottles of beer daily.  Encouraged cessation.  5] tobacco use nicotine patch ordered.  Discussed smoking cessation.  She's interested  in quitting, hasn't been able to before, but has tried.  Would like nicotine patch at discharge.  4 minutes tobacco cessation counseling.    6] hyperglycemia secondary to steroids.  Follow A1c, 5.7 (prediabetes), follow outpatient.   DVT prophylaxis: lovenox Code  Status: full  Family Communication: daughter, grandchildren at bedside Disposition Plan: pending cards eval   Consultants:   cardiology  Procedures:  Study Conclusions  - Left ventricle: The cavity size was mildly dilated. Wall   thickness was normal. Systolic function was severely reduced. The   estimated ejection fraction was in the range of 25% to 30%.   Diffuse hypokinesis. Doppler parameters are consistent with   abnormal left ventricular relaxation (grade 1 diastolic   dysfunction). - Aortic valve: There was mild regurgitation. - Mitral valve: There was mild regurgitation. - Left atrium: The atrium was mildly dilated.  Impressions:  - Severe global reduction in LV systolic function; mild diastolic   dsyfunction; mild LVE; mild AI and MR; mild LAE.  Conclusions: 1. Mild, nonobstructive coronary artery disease. Patent stent in the LCx with mild ISR. 2. Normal to low left and right heart filling pressures. 3. Low Fick cardiac output/index (3.3/1.8). 4. Transient hypotension following sedation, improved with 250 mL normal saline bolus.  Recommendations: 1. Continue goal-directed medical therapy for systolic heart failure due to nonischemic cardiomyopathy. 2. Gentle post-cath hydration given normal to low left and right heart filling pressures.  Recommend indefinite single antiplatelet therapy with aspirin 81mg  daily.  No indication for dual antiplatelet therapy at his time. Antimicrobials:  Anti-infectives (From admission, onward)   None      Subjective: Feeling better.  No complaints.  Objective: Vitals:   03/26/18 1216 03/26/18 1302 03/26/18 1459 03/26/18 1510  BP: (!) 93/59 94/67 (!) 90/50 (!) 100/58  Pulse: 60 67  61  Resp:      Temp:    (!) 97.2 F (36.2 C)  TempSrc:    Oral  SpO2:    93%  Weight:      Height:        Intake/Output Summary (Last 24 hours) at 03/26/2018 1821 Last data filed at 03/26/2018 1500 Gross per 24 hour  Intake 2202.88  ml  Output 1225 ml  Net 977.88 ml   Filed Weights   03/24/18 0343 03/25/18 0450 03/26/18 0502  Weight: 68.2 kg (150 lb 5.7 oz) 68.6 kg (151 lb 3.2 oz) 68 kg (150 lb)    Examination:  General: No acute distress. Cardiovascular: Heart sounds show Celia Friedland regular rate, and rhythm. No gallops or rubs. No murmurs. No JVD. Lungs: Clear to auscultation bilaterally with good air movement. No rales, rhonchi or wheezes. Abdomen: Soft, nontender, nondistended with normal active bowel sounds. Neurological: Alert and oriented 3. Moves all extremities 4. Cranial nerves II through XII grossly intact. Skin: Warm and dry. No rashes or lesions. Extremities: No clubbing or cyanosis. No edema.  Psychiatric: Mood and affect are normal. Insight and judgment are appropriate.    Data Reviewed: I have personally reviewed following labs and imaging studies  CBC: Recent Labs  Lab 03/23/18 0424 03/24/18 0449 03/26/18 0442  WBC 9.7 8.5 13.5*  NEUTROABS 4.4  --   --   HGB 17.5* 16.9* 18.1*  HCT 53.6* 50.0* 54.1*  MCV 100.2* 91.9 93.6  PLT 116* 112* 702*   Basic Metabolic Panel: Recent Labs  Lab 03/23/18 0424 03/24/18 0334 03/25/18 0435 03/26/18 0613  NA 138 139 136 134*  K 3.9 4.1 3.5 3.6  CL  107 103 100 98  CO2 18* 26 24 27   GLUCOSE 172* 139* 122* 100*  BUN 6 12 22* 25*  CREATININE 0.76 0.72 0.78 0.79  CALCIUM 9.1 9.0 9.0 9.0  MG  --   --   --  1.9   GFR: Estimated Creatinine Clearance: 78.2 mL/min (by C-G formula based on SCr of 0.79 mg/dL). Liver Function Tests: Recent Labs  Lab 03/23/18 0424 03/26/18 0613  AST 80* 41  ALT 43 36  ALKPHOS 95 94  BILITOT 1.3* 0.7  PROT 7.7 6.7  ALBUMIN 3.5 3.0*   No results for input(s): LIPASE, AMYLASE in the last 168 hours. No results for input(s): AMMONIA in the last 168 hours. Coagulation Profile: No results for input(s): INR, PROTIME in the last 168 hours. Cardiac Enzymes: Recent Labs  Lab 03/23/18 0900  TROPONINI 0.03*   BNP (last 3  results) No results for input(s): PROBNP in the last 8760 hours. HbA1C: Recent Labs    03/24/18 2150  HGBA1C 5.7*   CBG: Recent Labs  Lab 03/25/18 1639 03/25/18 2110 03/26/18 0925 03/26/18 1151 03/26/18 1708  GLUCAP 169* 129* 121*  121* 125* 110*   Lipid Profile: No results for input(s): CHOL, HDL, LDLCALC, TRIG, CHOLHDL, LDLDIRECT in the last 72 hours. Thyroid Function Tests: No results for input(s): TSH, T4TOTAL, FREET4, T3FREE, THYROIDAB in the last 72 hours. Anemia Panel: No results for input(s): VITAMINB12, FOLATE, FERRITIN, TIBC, IRON, RETICCTPCT in the last 72 hours. Sepsis Labs: No results for input(s): PROCALCITON, LATICACIDVEN in the last 168 hours.  Recent Results (from the past 240 hour(s))  MRSA PCR Screening     Status: None   Collection Time: 03/23/18  8:59 AM  Result Value Ref Range Status   MRSA by PCR NEGATIVE NEGATIVE Final    Comment:        The GeneXpert MRSA Assay (FDA approved for NASAL specimens only), is one component of Katya Rolston comprehensive MRSA colonization surveillance program. It is not intended to diagnose MRSA infection nor to guide or monitor treatment for MRSA infections. Performed at [facility], [place] [address], [place], [place] [address]   Surgical pcr screen     Status: None   Collection Time: 03/25/18  9:25 PM  Result Value Ref Range Status   MRSA, PCR NEGATIVE NEGATIVE Final   Staphylococcus aureus NEGATIVE NEGATIVE Final    Comment: (NOTE) The Xpert SA Assay (FDA approved for NASAL specimens in patients 80 years of age and older), is one component of Quinn Quam comprehensive surveillance program. It is not intended to diagnose infection nor to guide or monitor treatment. Performed at [facility], [place] [address], [place], [place] [address]          Radiology Studies: No results found.      Scheduled Meds: . aspirin  81 mg Oral Daily  . carvedilol  12.5 mg Oral BID WC  . [START ON 03/27/2018] enoxaparin  (LOVENOX) injection  40 mg Subcutaneous Q24H  . folic acid  1 mg Oral Daily  . insulin aspart  0-5 Units Subcutaneous QHS  . insulin aspart  0-9 Units Subcutaneous TID WC  . mometasone-formoterol  2 puff Inhalation BID  . multivitamin with minerals  1 tablet Oral Daily  . nicotine  14 mg Transdermal Daily  . predniSONE  40 mg Oral Q breakfast  . sacubitril-valsartan  1 tablet Oral BID  . sodium chloride flush  3 mL Intravenous Q12H  . sodium chloride flush  3  mL Intravenous Q12H  . spironolactone  12.5 mg Oral Daily  . thiamine  100 mg Oral Daily   Or  . thiamine  100 mg Intravenous Daily   Continuous Infusions: . sodium chloride Stopped (03/24/18 0831)  . sodium chloride       LOS: 3 days    Time spent: over 30 min    Fayrene Helper, MD [facility] Pager [phone]   If 7PM-7AM, please contact night-coverage [email or web address] Password [facility] 03/26/2018, 6:21 PM

## 2018-03-26 NOTE — Progress Notes (Signed)
Called to see patient for low systolic BP 65 mmHg She is comfortable and asymptomatic. No chest pain , dyspnea clamminess or diaphoresis Cath from right radial and brachial band in place no hematoma Lungs clear no murmur No signs of allergic reaction  No critical CAD at cath circumflex stent patent   BP 82 mmHg during cath with hypotension with sedation got some fluid EDP/PCWP only 3-5 mmgH got some fluid in lab   Plan:  Fluid bolus Observe  [name]

## 2018-03-26 NOTE — Progress Notes (Signed)
Pre-cath meds given with 20Givs present and patent left IV running KV0.28ml/hr CHG bath given with shaved Groin bilateral.

## 2018-03-26 NOTE — Progress Notes (Signed)
Patient returned from cath lab.  Currently denies pain.  Right brachial and right radial site level zero.  Will continue to monitor.

## 2018-03-26 NOTE — Progress Notes (Signed)
Patient's blood pressure 68/38 manual on left arm.  Patient asymptomatic. HR 64.  Karen Citizen, NP with cardiology paged. New orders received. Will continue to monitor.

## 2018-03-26 NOTE — Brief Op Note (Signed)
BRIEF CARDIAC CATHETERIZATION NOTE  Date: 03/26/2018 Time: 8:21 AM  PATIENT:  [name]  62 y.o. female  PRE-OPERATIVE DIAGNOSIS: Acute on chronic systolic heart failure  POST-OPERATIVE DIAGNOSIS: Nonischemic cardia myopathy  PROCEDURE:  Procedure(s): RIGHT/LEFT HEART CATH AND CORONARY ANGIOGRAPHY (N/A)  SURGEON:  Surgeon(s) and Role:    * Mariaguadalupe Fialkowski, MD - Primary  FINDINGS: 1. Mild, nonobstructive coronary artery disease.  Patent stent in the LCx with mild ISR. 2. Normal to low left and right heart filling pressures. 3. Low Fick cardiac output/index (3.3/1.8). 4. Transient hypotension following sedation, improved with 250 mL normal saline bolus.  RECOMMENDATIONS: 1. Continue goal-directed medical therapy for systolic heart failure due to nonischemic cardiomyopathy. 2. Gentle post-cath hydration given normal to low left and right heart filling pressures.  Nelva Bush, MD [facility] Pager: [phone]

## 2018-03-26 NOTE — Interval H&P Note (Signed)
History and Physical Interval Note:  03/26/2018 7:29 AM  Cisco  has presented today for cardiac catheterization, with the diagnosis of acute on chronic systolic heart failure. The various methods of treatment have been discussed with the patient and family. After consideration of risks, benefits and other options for treatment, the patient has consented to  Procedure(s): RIGHT/LEFT HEART CATH AND CORONARY ANGIOGRAPHY (N/A) as a surgical intervention .  The patient's history has been reviewed, patient examined, no change in status, stable for surgery.  I have reviewed the patient's chart and labs.  Questions were answered to the patient's satisfaction.    Cath Lab Visit (complete for each Cath Lab visit)  Clinical Evaluation Leading to the Procedure:   ACS: No.  Non-ACS:    Anginal/HF Classification: NYHA class IV  Anti-ischemic medical therapy: Minimal Therapy (1 class of medications)  Non-Invasive Test Results: No non-invasive testing performed LVEF 25-30% by echo -> high risk  Prior CABG: No previous CABG   Linzy Darling

## 2018-03-26 NOTE — Plan of Care (Signed)
  Problem: Clinical Measurements: Goal: Ability to maintain clinical measurements within normal limits will improve Outcome: Progressing   Problem: Clinical Measurements: Goal: Diagnostic test results will improve Outcome: Progressing   Problem: Cardiac: Goal: Ability to achieve and maintain adequate cardiopulmonary perfusion will improve Outcome: Progressing

## 2018-03-27 ENCOUNTER — Telehealth: Payer: Self-pay | Admitting: Cardiovascular Disease

## 2018-03-27 ENCOUNTER — Telehealth: Payer: Self-pay

## 2018-03-27 DIAGNOSIS — I5043 Acute on chronic combined systolic (congestive) and diastolic (congestive) heart failure: Secondary | ICD-10-CM

## 2018-03-27 DIAGNOSIS — J449 Chronic obstructive pulmonary disease, unspecified: Secondary | ICD-10-CM

## 2018-03-27 DIAGNOSIS — J9601 Acute respiratory failure with hypoxia: Secondary | ICD-10-CM

## 2018-03-27 LAB — CBC
HCT: 50.3 % — ABNORMAL HIGH (ref 36.0–46.0)
Hemoglobin: 16.4 g/dL — ABNORMAL HIGH (ref 12.0–15.0)
MCH: 30.9 pg (ref 26.0–34.0)
MCHC: 32.6 g/dL (ref 30.0–36.0)
MCV: 94.9 fL (ref 78.0–100.0)
PLATELETS: 123 10*3/uL — AB (ref 150–400)
RBC: 5.3 MIL/uL — AB (ref 3.87–5.11)
RDW: 13.2 % (ref 11.5–15.5)
WBC: 10.2 10*3/uL (ref 4.0–10.5)

## 2018-03-27 LAB — BASIC METABOLIC PANEL
Anion gap: 9 (ref 5–15)
BUN: 14 mg/dL (ref 6–20)
CALCIUM: 8.6 mg/dL — AB (ref 8.9–10.3)
CO2: 26 mmol/L (ref 22–32)
CREATININE: 0.66 mg/dL (ref 0.44–1.00)
Chloride: 105 mmol/L (ref 98–111)
Glucose, Bld: 87 mg/dL (ref 70–99)
Potassium: 3.4 mmol/L — ABNORMAL LOW (ref 3.5–5.1)
SODIUM: 140 mmol/L (ref 135–145)

## 2018-03-27 LAB — BLOOD GAS, ARTERIAL
ACID-BASE DEFICIT: 0 mmol/L (ref 0.0–2.0)
Bicarbonate: 23.9 mmol/L (ref 20.0–28.0)
DRAWN BY: 518061
FIO2: 21
O2 SAT: 93.5 %
Patient temperature: 98.6
pCO2 arterial: 37.2 mmHg (ref 32.0–48.0)
pH, Arterial: 7.423 (ref 7.350–7.450)
pO2, Arterial: 74.2 mmHg — ABNORMAL LOW (ref 83.0–108.0)

## 2018-03-27 LAB — MAGNESIUM: MAGNESIUM: 1.9 mg/dL (ref 1.7–2.4)

## 2018-03-27 LAB — GLUCOSE, CAPILLARY
Glucose-Capillary: 86 mg/dL (ref 70–99)
Glucose-Capillary: 81 mg/dL (ref 70–99)

## 2018-03-27 MED ORDER — ALBUTEROL SULFATE HFA 108 (90 BASE) MCG/ACT IN AERS: 2.0000 | INHALATION_SPRAY | Freq: Four times a day (QID) | RESPIRATORY_TRACT | 0 refills | Status: DC | PRN

## 2018-03-27 MED ORDER — BUDESONIDE-FORMOTEROL FUMARATE 160-4.5 MCG/ACT IN AERO: 2.0000 | INHALATION_SPRAY | Freq: Two times a day (BID) | RESPIRATORY_TRACT | 0 refills | Status: DC

## 2018-03-27 MED ORDER — NICOTINE 14 MG/24HR TD PT24: 14.0000 mg | MEDICATED_PATCH | Freq: Every day | TRANSDERMAL | 0 refills | Status: DC

## 2018-03-27 MED ORDER — ADULT MULTIVITAMIN W/MINERALS CH: 1.0000 | ORAL_TABLET | Freq: Every day | ORAL | 0 refills | Status: AC

## 2018-03-27 MED ORDER — FOLIC ACID 1 MG PO TABS: 1.0000 mg | ORAL_TABLET | Freq: Every day | ORAL | 0 refills | Status: AC

## 2018-03-27 MED ORDER — CARVEDILOL 12.5 MG PO TABS: 12.5000 mg | ORAL_TABLET | Freq: Two times a day (BID) | ORAL | 0 refills | Status: DC

## 2018-03-27 MED ORDER — SPIRONOLACTONE 25 MG PO TABS: 12.5000 mg | ORAL_TABLET | Freq: Every day | ORAL | 0 refills | Status: DC

## 2018-03-27 MED ORDER — POTASSIUM CHLORIDE CRYS ER 20 MEQ PO TBCR
40.0000 meq | EXTENDED_RELEASE_TABLET | Freq: Once | ORAL | Status: AC
  Administered 2018-03-27: 40 meq via ORAL
  Filled 2018-03-27: qty 2

## 2018-03-27 MED ORDER — THIAMINE HCL 100 MG PO TABS: 100.0000 mg | ORAL_TABLET | Freq: Every day | ORAL | 0 refills | Status: AC

## 2018-03-27 MED ORDER — SACUBITRIL-VALSARTAN 97-103 MG PO TABS: 1.0000 | ORAL_TABLET | Freq: Two times a day (BID) | ORAL | 0 refills | Status: AC

## 2018-03-27 MED FILL — Heparin Sod (Porcine)-NaCl IV Soln 1000 Unit/500ML-0.9%: INTRAVENOUS | Qty: 1000 | Status: AC

## 2018-03-27 NOTE — Progress Notes (Signed)
Subjective:  Denies SSCP, palpitations or Dyspnea   Objective:  Vitals:   03/27/18 0020 03/27/18 0441 03/27/18 0748 03/27/18 0751  BP: 115/71 97/75 110/77   Pulse: 70 72 70   Resp: (!) 24 (!) 22 (!) 23   Temp: 97.9 F (36.6 C) (!) 97.4 F (36.3 C) 97.9 F (36.6 C)   TempSrc: Oral Oral Oral   SpO2: 95% 94% 92% 94%  Weight:  154 lb 1.6 oz (69.9 kg)    Height:        Intake/Output from previous day:  Intake/Output Summary (Last 24 hours) at 03/27/2018 0834 Last data filed at 03/27/2018 0600 Gross per 24 hour  Intake 1944.88 ml  Output 2925 ml  Net -980.12 ml    Physical Exam: Affect appropriate Chronically ill black female  HEENT: normal Neck supple with no adenopathy JVP normal no bruits no thyromegaly Lungs clear with no wheezing and good diaphragmatic motion Heart:  S1/S2 no murmur, no rub, gallop or click PMI enlarged AICD under left clavicle  Abdomen: benighn, BS positve, no tenderness, no AAA no bruit.  No HSM or HJR Distal pulses intact with no bruits No edema Neuro non-focal Skin warm and dry No muscular weakness   Lab Results: Basic Metabolic Panel: Recent Labs    03/26/18 0613 03/27/18 0552  NA 134* 140  K 3.6 3.4*  CL 98 105  CO2 27 26  GLUCOSE 100* 87  BUN 25* 14  CREATININE 0.79 0.66  CALCIUM 9.0 8.6*  MG 1.9 1.9   Liver Function Tests: Recent Labs    03/26/18 0613  AST 41  ALT 36  ALKPHOS 94  BILITOT 0.7  PROT 6.7  ALBUMIN 3.0*   No results for input(s): LIPASE, AMYLASE in the last 72 hours. CBC: Recent Labs    03/26/18 0442 03/27/18 0552  WBC 13.5* 10.2  HGB 18.1* 16.4*  HCT 54.1* 50.3*  MCV 93.6 94.9  PLT 124* 123*   Hemoglobin A1C: Recent Labs    03/24/18 2150  HGBA1C 5.7*    Imaging: No results found.  Cardiac Studies:  ECG: SR LVH no acute changes no BBB    Telemetry:  NSR no arrhythmia 03/27/2018   Echo: EF 25-30%   Medications:   . aspirin  81 mg Oral Daily  . carvedilol  12.5 mg Oral BID WC   . enoxaparin (LOVENOX) injection  40 mg Subcutaneous Q24H  . folic acid  1 mg Oral Daily  . insulin aspart  0-5 Units Subcutaneous QHS  . insulin aspart  0-9 Units Subcutaneous TID WC  . mometasone-formoterol  2 puff Inhalation BID  . multivitamin with minerals  1 tablet Oral Daily  . nicotine  14 mg Transdermal Daily  . sacubitril-valsartan  1 tablet Oral BID  . sodium chloride flush  3 mL Intravenous Q12H  . sodium chloride flush  3 mL Intravenous Q12H  . spironolactone  12.5 mg Oral Daily  . thiamine  100 mg Oral Daily   Or  . thiamine  100 mg Intravenous Daily     . sodium chloride Stopped (03/24/18 0831)  . sodium chloride      Assessment/Plan:   CHF:  Seems resolved CXR on admission not remarkable and not volume overloaded on exam right heart pressures  At cath were very low favoring non CHF cause of her dyspnea with LVEDP and PCWP only 3 mmHg. Continue low dose entresto coreg and aldactone   COPD: I think this is a bigger  issue Had atelectasis on admission Hct is elevated suggesting hypoxemia would check resting room air ABG ( ordered) and needs 6 minute walk test assess for desats and need for home oxygen Should have outpatient pulmonary f/u and formal PFTls   AICD:  Normal function f/u Dr Lovena Le QRS on ECG is narrow so not candidate for BiV upgrade   Ok to d/c home today  Jenkins Rouge 03/27/2018, 8:34 AM

## 2018-03-27 NOTE — Discharge Summary (Signed)
Physician Discharge Summary  [name] HEN:[id number] DOB: [birth year] DOA: 03/23/2018  PCP: Vicenta Aly, FNP  Admit date: 03/23/2018 Discharge date: 03/27/2018  Time spent: 40 minutes  Recommendations for Outpatient Follow-up:  1. Follow up outpatient CBC/CMP 2. Follow volume status, need for lasix as outpatient.  Pt euvolemic here and BP's soft at discharge.  Held at discharge. 3. Follow up with cardiology as outpatient 4. Follow up with pulmonology as outpatient.  Recommend outpatient PFT's.   Discharge Diagnoses:  Principal Problem:   Acute respiratory failure with hypoxia (HCC) Active Problems:   Smoker   Automatic implantable cardioverter-defibrillator in situ   COPD with acute exacerbation (HCC)   Alcohol use (HCC)   Essential hypertension   CAD (coronary artery disease)   Acute on chronic systolic heart failure ([facility])   Hyperglycemia   Discharge Condition: stable  Diet recommendation: heart healthy  Filed Weights   03/25/18 0450 03/26/18 0502 03/27/18 0441  Weight: 68.6 kg (151 lb 3.2 oz) 68 kg (150 lb) 69.9 kg (154 lb 1.6 oz)    History of present illness:  62 y.o.femalewith medical history significantforCAD MI 2008 with PCI , Ischemic DCM with AICD EF improved by last echo 50% 2016 Smoker with COPD, COPD not on home oxygen, EtOH abuse, tobacco abuse,a, peripheral vascular disease, hypertension, persistent emergency Department chief complaint shortness of breath. Initial evaluation reveals acute respiratory failure secondary to acute on chronic heart failure in the setting of COPD exacerbation. [facility] are asked to admit  She was admitted for HF exacerbation with concern for COPD exacerbation as well.  She initially received bipap, nebs, and was diuresed.  She improved with therapies, but had echo with decreased EF to 25-30%.  Cardiology was c/s and she had R and L heart cath with nonobstructive CAD.  Recommended goal directed medical therapy for  systolic HF and indefinite aspirin.  Post cath she had hypotension which improved with boluses.  Discharged with plans for f/u with cards and pulm as outpatient.    See below for additional details  Hospital Course:  ]acute hypoxic respiratory failure secondary to acute on chronic systolic heart failure with COPD exacerbation  - upon admission her BNP was greater than thousand - Chest x-ray showed mild interstitial pulmonary edema.  - ABG showed acidemia with CO2 greater than 66.  - Treated for both HF and COPD exacerbation in presence of above with bipap, lasix, nebs, steroids - Echo notable for decreased EF to 36-14%, grade 1 diastolic dysfunction - Continue entresto and coreg, add spironolactone.  Lasix held at discharge. - With newly decreased EF, will consult cardiology -> s/p cath with mild nonobstrutive CAD and patent stent to LCx with mild ISR.  Recommended goal directed medical therapy for systolic HF.  Indefinite single antiplatelet therapy. - I/O, daily weights  2]COPD exacerbation -  ABG today with pO2 74.  Did not qualify for O2.  Cards suspecting this may have been bigger issue, recommending follow up with pulm as outpatient and formal PFT's.  Refilled symbicort and albuterol.  Steroids d/c'd with lack of symptoms.    # Hypotension:  After cath, has improved after boluses.    3]history of CAD MI and PCI in 2008.  Troponin on 7/15 0.03.  No CP. Follow-up echo (as noted above). Continue current cardiac medications.  4]history of alcohol abuse Encouraged cessation.  She notes she's planning to quit.   5]tobacco use - encouraged cessation, planning to quit.  Nicotine patch prescribed.  6]hyperglycemia secondary  to steroids. Follow A1c, 5.7 (prediabetes), follow outpatient.   # Thrombocytopenia: chronic, follow outpatient  Procedures: Conclusions: 1. Mild, nonobstructive coronary artery disease. Patent stent in the LCx with mild ISR. 2. Normal to low left and  right heart filling pressures. 3. Low Fick cardiac output/index (3.3/1.8). 4. Transient hypotension following sedation, improved with 250 mL normal saline bolus.  Recommendations: 1. Continue goal-directed medical therapy for systolic heart failure due to nonischemic cardiomyopathy. 2. Gentle post-cath hydration given normal to low left and right heart filling pressures.  Recommend indefinite single antiplatelet therapy with aspirin 81mg  daily.  No indication for dual antiplatelet therapy at his time.   Study Conclusions  - Left ventricle: The cavity size was mildly dilated. Wall   thickness was normal. Systolic function was severely reduced. The   estimated ejection fraction was in the range of 25% to 30%.   Diffuse hypokinesis. Doppler parameters are consistent with   abnormal left ventricular relaxation (grade 1 diastolic   dysfunction). - Aortic valve: There was mild regurgitation. - Mitral valve: There was mild regurgitation. - Left atrium: The atrium was mildly dilated.  Impressions:  - Severe global reduction in LV systolic function; mild diastolic   dsyfunction; mild LVE; mild AI and MR; mild LAE. Consultations:  cardiology  Discharge Exam: Vitals:   03/27/18 1139 03/27/18 1141  BP: (!) 88/65 98/67  Pulse: (!) 37 66  Resp: 20   Temp: (!) 97.5 F (36.4 C)   SpO2: 91%    Feeling well. Ready to go home  General: No acute distress. Cardiovascular: Heart sounds show a regular rate, and rhythm.  Lungs: Clear to auscultation bilaterally with good air movement.  Abdomen: Soft, nontender, nondistended Neurological: Alert and oriented 3. Moves all extremities . Cranial nerves II through XII grossly intact. Skin: Warm and dry. No rashes or lesions. Extremities: No clubbing or cyanosis. No edema.  Psychiatric: Mood and affect are normal. Insight and judgment are appropriate.  Discharge Instructions   Discharge Instructions    (HEART FAILURE PATIENTS) Call  MD:  Anytime you have any of the following symptoms: 1) 3 pound weight gain in 24 hours or 5 pounds in 1 week 2) shortness of breath, with or without a dry hacking cough 3) swelling in the hands, feet or stomach 4) if you have to sleep on extra pillows at night in order to breathe.   Complete by:  As directed    Avoid straining   Complete by:  As directed    Call MD for:  difficulty breathing, headache or visual disturbances   Complete by:  As directed    Call MD for:  extreme fatigue   Complete by:  As directed    Call MD for:  persistant dizziness or light-headedness   Complete by:  As directed    Call MD for:  persistant nausea and vomiting   Complete by:  As directed    Call MD for:  redness, tenderness, or signs of infection (pain, swelling, redness, odor or green/yellow discharge around incision site)   Complete by:  As directed    Call MD for:  severe uncontrolled pain   Complete by:  As directed    Call MD for:  temperature >100.4   Complete by:  As directed    Diet - low sodium heart healthy   Complete by:  As directed    Discharge instructions   Complete by:  As directed    You were seen for a heart failure  exacerbation.  You also may have had a COPD exacerbation.  We started you on spironolactone.  Please continue your entresto and coreg (I sent refills of this).  You had a catheterization which showed nonobstructive coronary artery disease.  Continue your aspirin daily.  You should follow up with pulmonology as an outpatient for lung function tests.  Take your symbicort daily.  Take albuterol as needed.  Ask your PCP about pulmonology referral.  Follow up with your PCP within the next few days for a hospital follow up and repeat labs.  Follow up with cardiology within the next week or so.  Return for new, recurrent, or worsening symptoms.  Please ask your PCP to request records from this hospitalization so they know what was done and what the next steps will be.    Face-to-face encounter (required for Medicare/Medicaid patients)   Complete by:  As directed    I Fayrene Helper certify that this patient is under my care and that I, or a nurse practitioner or physician's assistant working with me, had a face-to-face encounter that meets the physician face-to-face encounter requirements with this patient on 03/27/2018. The encounter with the patient was in whole, or in part for the following medical condition(s) which is the primary reason for home health care (List medical condition): heart failure   The encounter with the patient was in whole, or in part, for the following medical condition, which is the primary reason for home health care:  heart failure exacerbation   I certify that, based on my findings, the following services are medically necessary home health services:  Nursing   Reason for Medically Necessary Home Health Services:  Skilled Nursing- Teaching of Disease Process/Symptom Management   My clinical findings support the need for the above services:  Shortness of breath with activity   Further, I certify that my clinical findings support that this patient is homebound due to:  Shortness of Breath with activity   Heart Failure patients record your daily weight using the same scale at the same time of day   Complete by:  As directed    Home Health   Complete by:  As directed    To provide the following care/treatments:  RN   Increase activity slowly   Complete by:  As directed    STOP any activity that causes chest pain, shortness of breath, dizziness, sweating, or exessive weakness   Complete by:  As directed    Schedule appointment   Complete by:  As directed    With PCP, cardiology, and pulmonology     Allergies as of 03/27/2018      Reactions   Clopidogrel Bisulfate Rash      Medication List    TAKE these medications   acetaminophen 500 MG tablet Commonly known as:  TYLENOL Take 500 mg by mouth every 6 (six) hours as needed for  headache.   albuterol 108 (90 Base) MCG/ACT inhaler Commonly known as:  PROAIR HFA Inhale 2 puffs into the lungs every 6 (six) hours as needed for wheezing or shortness of breath.   aspirin 81 MG tablet Take 81 mg by mouth daily.   budesonide-formoterol 160-4.5 MCG/ACT inhaler Commonly known as:  SYMBICORT Inhale 2 puffs into the lungs 2 (two) times daily.   carvedilol 12.5 MG tablet Commonly known as:  COREG Take 1 tablet (12.5 mg total) by mouth 2 (two) times daily with a meal. What changed:    how much to take  how to  take this  when to take this  additional instructions   feeding supplement (ENSURE COMPLETE) Liqd Take 237 mLs by mouth daily.   fluticasone 50 MCG/ACT nasal spray Commonly known as:  FLONASE Place 2 sprays into both nostrils as needed for allergies or rhinitis.   folic acid 1 MG tablet Commonly known as:  FOLVITE Take 1 tablet (1 mg total) by mouth daily. Start taking on:  03/28/2018   multivitamin with minerals Tabs tablet Take 1 tablet by mouth daily. Start taking on:  03/28/2018   nicotine 14 mg/24hr patch Commonly known as:  NICODERM CQ - dosed in mg/24 hours Place 1 patch (14 mg total) onto the skin daily. Start taking on:  03/28/2018   nitroGLYCERIN 0.4 MG SL tablet Commonly known as:  NITROSTAT Place 1 tablet (0.4 mg total) under the tongue every 5 (five) minutes as needed for chest pain (up to 3 doses).   sacubitril-valsartan 97-103 MG Commonly known as:  ENTRESTO Take 1 tablet by mouth 2 (two) times daily.   spironolactone 25 MG tablet Commonly known as:  ALDACTONE Take 0.5 tablets (12.5 mg total) by mouth daily. Start taking on:  03/28/2018   thiamine 100 MG tablet Take 1 tablet (100 mg total) by mouth daily. Start taking on:  03/28/2018      Allergies  Allergen Reactions  . Clopidogrel Bisulfate Rash   Follow-up Information    Vicenta Aly, FNP On 04/01/2018.   Specialty:  Nurse Practitioner Why:  @ 3:15PM Contact  information: [place] [address] [phone]        Imogene Burn, PA-C. Schedule an appointment as soon as possible for a visit on 04/07/2018.   Specialty:  Cardiology Why:  @ 1:15PM Contact information: [address] [place] [address] [phone]        [facility]. Go on 04/03/2018.   Specialty:  Pulmonology Why:  @ 10:15 AM WITH DR Rosalin Hawking information: [place] [phone]           The results of significant diagnostics from this hospitalization (including imaging, microbiology, ancillary and laboratory) are listed below for reference.    Significant Diagnostic Studies: Dg Chest Port 1 View  Result Date: 03/23/2018 CLINICAL DATA:  Shortness of breath EXAM: PORTABLE CHEST 1 VIEW COMPARISON:  11/05/2017 FINDINGS: Left chest wall single lead AICD is unchanged. There is mild interstitial pulmonary edema, worsened compared to the prior study. Mild cardiomegaly. No focal consolidation. No pleural effusion or pneumothorax. IMPRESSION: Cardiomegaly and mild interstitial pulmonary edema. Electronically Signed   By: Ulyses Jarred M.D.   On: 03/23/2018 05:16    Microbiology: Recent Results (from the past 240 hour(s))  MRSA PCR Screening     Status: None   Collection Time: 03/23/18  8:59 AM  Result Value Ref Range Status   MRSA by PCR NEGATIVE NEGATIVE Final    Comment:        The GeneXpert MRSA Assay (FDA approved for NASAL specimens only), is one component of a comprehensive MRSA colonization surveillance program. It is not intended to diagnose MRSA infection nor to guide or monitor treatment for MRSA infections. Performed at [facility], [place] [address] [place], [place] [address]   Surgical pcr screen     Status: None   Collection Time: 03/25/18  9:25 PM  Result Value Ref Range Status   MRSA, PCR NEGATIVE NEGATIVE Final   Staphylococcus  aureus NEGATIVE NEGATIVE Final    Comment: (NOTE) The Xpert SA Assay (FDA approved for NASAL specimens in patients 76 years of age and older), is one component of a comprehensive surveillance program. It is not intended to diagnose infection nor to guide or monitor treatment. Performed at [facility], [place] [address] [place], [place] [address]      Labs: Basic Metabolic Panel: Recent Labs  Lab 03/23/18 0424 03/24/18 0334 03/25/18 0435 03/26/18 0613 03/27/18 0552  NA 138 139 136 134* 140  K 3.9 4.1 3.5 3.6 3.4*  CL 107 103 100 98 105  CO2 18* 26 24 27 26   GLUCOSE 172* 139* 122* 100* 87  BUN 6 12 22* 25* 14  CREATININE 0.76 0.72 0.78 0.79 0.66  CALCIUM 9.1 9.0 9.0 9.0 8.6*  MG  --   --   --  1.9 1.9   Liver Function Tests: Recent Labs  Lab 03/23/18 0424 03/26/18 0613  AST 80* 41  ALT 43 36  ALKPHOS 95 94  BILITOT 1.3* 0.7  PROT 7.7 6.7  ALBUMIN 3.5 3.0*   No results for input(s): LIPASE, AMYLASE in the last 168 hours. No results for input(s): AMMONIA in the last 168 hours. CBC: Recent Labs  Lab 03/23/18 0424 03/24/18 0449 03/26/18 0442 03/27/18 0552  WBC 9.7 8.5 13.5* 10.2  NEUTROABS 4.4  --   --   --   HGB 17.5* 16.9* 18.1* 16.4*  HCT 53.6* 50.0* 54.1* 50.3*  MCV 100.2* 91.9 93.6 94.9  PLT 116* 112* 124* 123*   Cardiac Enzymes: Recent Labs  Lab 03/23/18 0900  TROPONINI 0.03*   BNP: BNP (last 3 results) Recent Labs    03/23/18 0424  BNP 1,069.6*    ProBNP (last 3 results) No results for input(s): PROBNP in the last 8760 hours.  CBG: Recent Labs  Lab 03/26/18 1151 03/26/18 1708 03/26/18 2119 03/27/18 0745 03/27/18 1135  GLUCAP 125* 110* 111* 86 81       Signed:  Fayrene Helper MD.  [facility] 03/27/2018, 8:35 PM

## 2018-03-27 NOTE — Telephone Encounter (Signed)
Patient has TOC appointment with Ermalinda Barrios PA on 04/07/18. Referrals have been put in for CHF clinic and pulmonary. Will forward to [facility] pool.

## 2018-03-27 NOTE — Progress Notes (Signed)
Patient is independent of all of her ADL's, lives with her boyfriend and does not qualify for [facility] services at this time; Attending MD made aware; Mindi Slicker [facility] [phone]

## 2018-03-27 NOTE — Telephone Encounter (Signed)
New Message       Patient is needing an appt, before discharge. At this time there is nothing available.

## 2018-03-27 NOTE — Telephone Encounter (Signed)
Made patient an appointment with Ermalinda Barrios PA on 04/07/18 at 1:30 pm.

## 2018-03-27 NOTE — Telephone Encounter (Signed)
-----   Message from [name], MD sent at 03/27/2018  8:41 AM EDT ----- Needs TOC appointment with PA and f/u CHF clinic  Needs new referral to [facility] for COPD

## 2018-03-27 NOTE — Progress Notes (Signed)
SATURATION QUALIFICATIONS: (This note is used to comply with regulatory documentation for home oxygen)  Patient Saturations on Room Air at Rest = 99%  Patient Saturations on Room Air while Ambulating = 97%  Patient Saturations on  Liters of oxygen while Ambulating = %  Please briefly explain why patient needs home oxygen: 

## 2018-03-31 NOTE — Telephone Encounter (Signed)
**Note De-Identified [name] Obfuscation** Patient contacted regarding discharge from [facility] on 03/27/2018.  Patient understands to follow up with provider Ermalinda Barrios, PA-c on 04/07/18 at 1:00 at [facility] in [place]. Patient understands discharge instructions? Yes Patient understands medications and regiment? Yes Patient understands to bring all medications to this visit? Yes

## 2018-04-01 ENCOUNTER — Telehealth (HOSPITAL_COMMUNITY): Payer: Self-pay | Admitting: Vascular Surgery

## 2018-04-01 DIAGNOSIS — I10 Essential (primary) hypertension: Secondary | ICD-10-CM | POA: Diagnosis not present

## 2018-04-01 DIAGNOSIS — I472 Ventricular tachycardia: Secondary | ICD-10-CM | POA: Diagnosis not present

## 2018-04-01 DIAGNOSIS — R8290 Unspecified abnormal findings in urine: Secondary | ICD-10-CM | POA: Diagnosis not present

## 2018-04-01 DIAGNOSIS — Z7901 Long term (current) use of anticoagulants: Secondary | ICD-10-CM | POA: Diagnosis not present

## 2018-04-01 DIAGNOSIS — I5022 Chronic systolic (congestive) heart failure: Secondary | ICD-10-CM | POA: Diagnosis not present

## 2018-04-01 DIAGNOSIS — Z95810 Presence of automatic (implantable) cardiac defibrillator: Secondary | ICD-10-CM | POA: Diagnosis not present

## 2018-04-01 DIAGNOSIS — J449 Chronic obstructive pulmonary disease, unspecified: Secondary | ICD-10-CM | POA: Diagnosis not present

## 2018-04-01 DIAGNOSIS — I509 Heart failure, unspecified: Secondary | ICD-10-CM | POA: Diagnosis not present

## 2018-04-01 NOTE — Telephone Encounter (Signed)
Left pt message to make new chf appt next ava

## 2018-04-03 ENCOUNTER — Ambulatory Visit (INDEPENDENT_AMBULATORY_CARE_PROVIDER_SITE_OTHER): Payer: Medicare Other | Admitting: Internal Medicine

## 2018-04-03 ENCOUNTER — Encounter: Payer: Self-pay | Admitting: Internal Medicine

## 2018-04-03 VITALS — BP 96/58 | HR 68 | Ht 69.0 in | Wt 156.4 lb

## 2018-04-03 DIAGNOSIS — I255 Ischemic cardiomyopathy: Secondary | ICD-10-CM | POA: Diagnosis not present

## 2018-04-03 DIAGNOSIS — R911 Solitary pulmonary nodule: Secondary | ICD-10-CM

## 2018-04-03 DIAGNOSIS — R918 Other nonspecific abnormal finding of lung field: Secondary | ICD-10-CM

## 2018-04-03 DIAGNOSIS — J439 Emphysema, unspecified: Secondary | ICD-10-CM | POA: Diagnosis not present

## 2018-04-03 MED ORDER — TIOTROPIUM BROMIDE MONOHYDRATE 1.25 MCG/ACT IN AERS: 2.0000 | INHALATION_SPRAY | Freq: Every day | RESPIRATORY_TRACT | 0 refills | Status: DC

## 2018-04-03 NOTE — Patient Instructions (Addendum)
Pulmonary emphysema, unspecified emphysema type ([place])   - stop dulera due to throat intolerance - start spiriva daily  - use albuterol as needed - do full PFT next few to several weeks -glad you quit smoking   Multiple lung nodules on CT - on CT July 2018 - do repeat CT chest wo contrast next few to several weeks  Followup - return to see an APP to review PFT and CT results and respnse to spiriva   - return in few to several weeks

## 2018-04-03 NOTE — Progress Notes (Signed)
Patient seen in the office today and instructed on use of spiriva respimat 1.25.  Patient expressed understanding and demonstrated technique.

## 2018-04-03 NOTE — Progress Notes (Signed)
Subjective:    Patient ID: [name], female    DOB: 1955-11-12, 62 y.o.   MRN: [id number]  PCP Vicenta Aly, FNP   HPI   IOV 04/03/2018  Chief Complaint  Patient presents with  . Consult    Referred by heart failure. Pt was admitted to the hospital 7/15-7/19 due to resp failure and due to the admission is why pt is here for the consult. Pt was placed on O2 while at the hospital but states she was not sent home on O2. Pt states she does not have any current complaints of cough, SOB, or CP.   [name] , 62 y.o. , with dob [birth year] and female ,Not Hispanic or Latino from [address]  [place] [id number] - presents to lung clinic for eval of copd. She herself is not sure why she is in lung clinic. History is gained from talking to her and review of the medical record. Her granddaughter is here with her but does not give any history. As best as I can gather she suffers from chronic systolic heart failure and in July 2019 had a heart failure exacerbation and admission. Chest x-ray visualized which was done at that time showed pulmonary congestion. Review of the cardiology notes indicate that she has COPD and a felt she is best served under a pulmonologist. According to the patient she is aware of a COPD diagnosis for the last few years. She was previously on Symbicort but had oral intolerance to it. Therefore after the recent admission cardiologist switched her to [facility] but she does not like that either. Currently she feels improved and at baseline after the heart failure exacerbation in July 2019. She is also taking efforts to quit smoking which she quit 2 weeks ago. She only has mild exertional shortness of breath but no cough or sputum production and she feels overall she's stable. She does not think her COPD inhaler helps her. Review of the chart indicates that she's not had pulmonary function test but the diagnosis of COPD emphysema seen on CT scan of the chest in  July 2018 and before. I personally visualized the CT chest. The symptoms score is documented below  There are also multiple nodules on the lung CT which are visualized.      CAT COPD Symptom & Quality of Life Score (GSK trademark) 0 is no burden. 5 is highest burden 04/03/2018   Never Cough -> Cough all the time 3  No phlegm in chest -> Chest is full of phlegm 5  No chest tightness -> Chest feels very tight 0  No dyspnea for 1 flight stairs/hill -> Very dyspneic for 1 flight of stairs 0  No limitations for ADL at home -> Very limited with ADL at home 1  Confident leaving home -> Not at all confident leaving home 0  Sleep soundly -> Do not sleep soundly because of lung condition 0  Lots of Energy -> No energy at all 1  TOTAL Score (max 40)  10        Simple office walk 185 feet x  3 laps goal with forehead probe 04/03/2018   O2 used Room air  Number laps completed 3  Comments about pace Normal pace  Resting Pulse Ox/HR 100% and 66/min  Final Pulse Ox/HR 99% and 19/min  Desaturated </= 88% no  Desaturated <= 3% points no  Got Tachycardic >/= 90/min no  Symptoms at end of test none  Miscellaneous comments Normal walk    Results for Karen, [name] (MRN [id number]) as of 04/03/2018 11:11  Ref. Range 03/27/2018 05:52  Creatinine Latest Ref Range: 0.44 - 1.00 mg/dL 0.66  Results for Karen, [name] (MRN [id number]) as of 04/03/2018 11:11  Ref. Range 03/27/2018 05:52  Hemoglobin Latest Ref Range: 12.0 - 15.0 g/dL 16.4 (H)     has a past medical history of Acute on chronic systolic heart failure ([place]), Acute respiratory failure (HCC), Automatic implantable cardioverter-defibrillator in situ, CAD (coronary artery disease), COPD (chronic obstructive pulmonary disease) ([place]), ETOH abuse, Hyperglycemia, HYPERLIPIDEMIA-MIXED (02/07/2009), HYPERTENSION, BENIGN (08/22/2009), PVD (peripheral vascular disease) ([place]), SYSTOLIC HEART FAILURE, CHRONIC (02/07/2009), TIA (transient ischemic attack)  (05/18/2014), Tobacco abuse, and VENTRICULAR TACHYCARDIA (05/21/2010).   reports that she quit smoking 12 days ago. Her smoking use included cigarettes. She has a 20.00 pack-year smoking history. She has never used smokeless tobacco.  Past Surgical History:  Procedure Laterality Date  . CARDIAC DEFIBRILLATOR PLACEMENT  10/2012  . CESAREAN SECTION  1977; 1982  . CYSTOSCOPY W/ STONE MANIPULATION  1990's  . IMPLANTABLE CARDIOVERTER DEFIBRILLATOR IMPLANT N/A 10/21/2012   Procedure: IMPLANTABLE CARDIOVERTER DEFIBRILLATOR IMPLANT;  Surgeon: Evans Lance, MD;  Location: [facility] C[facility] LAB;  Service: Cardiovascular;  Laterality: N/A;  . RIGHT/LEFT HEART CATH AND CORONARY ANGIOGRAPHY N/A 03/26/2018   Procedure: RIGHT/LEFT HEART CATH AND CORONARY ANGIOGRAPHY;  Surgeon: Nelva Bush, MD;  Location: [facility] CV LAB;  Service: Cardiovascular;  Laterality: N/A;    Allergies  Allergen Reactions  . Clopidogrel Bisulfate Rash    Immunization History  Administered Date(s) Administered  . Influenza,inj,Quad PF,6+ Mos 12/03/2014  . Pneumococcal Polysaccharide-23 12/03/2014    Family History  Problem Relation Age of Onset  . Cancer Mother        cervical  . Heart attack Father   . Asthma Brother   . Other Brother        Pacemaker     Current Outpatient Medications:  .  acetaminophen (TYLENOL) 500 MG tablet, Take 500 mg by mouth every 6 (six) hours as needed for headache. , Disp: , Rfl:  .  albuterol (PROAIR HFA) 108 (90 Base) MCG/ACT inhaler, Inhale 2 puffs into the lungs every 6 (six) hours as needed for wheezing or shortness of breath., Disp: 1 Inhaler, Rfl: 0 .  aspirin 81 MG tablet, Take 81 mg by mouth daily., Disp: , Rfl:  .  budesonide-formoterol (SYMBICORT) 160-4.5 MCG/ACT inhaler, Inhale 2 puffs into the lungs 2 (two) times daily., Disp: 6 g, Rfl: 0 .  carvedilol (COREG) 12.5 MG tablet, Take 1 tablet (12.5 mg total) by mouth 2 (two) times daily with a meal., Disp: 60 tablet, Rfl: 0 .   feeding supplement, ENSURE COMPLETE, (ENSURE COMPLETE) LIQD, Take 237 mLs by mouth daily., Disp: , Rfl:  .  fluticasone (FLONASE) 50 MCG/ACT nasal spray, Place 2 sprays into both nostrils as needed for allergies or rhinitis., Disp: , Rfl:  .  folic acid (FOLVITE) 1 MG tablet, Take 1 tablet (1 mg total) by mouth daily., Disp: 30 tablet, Rfl: 0 .  Multiple Vitamin (MULTIVITAMIN WITH MINERALS) TABS tablet, Take 1 tablet by mouth daily., Disp: 30 tablet, Rfl: 0 .  nicotine (NICODERM CQ - DOSED IN MG/24 HOURS) 14 mg/24hr patch, Place 1 patch (14 mg total) onto the skin daily., Disp: 28 patch, Rfl: 0 .  sacubitril-valsartan (ENTRESTO) 97-103 MG, Take 1 tablet by mouth 2 (two) times daily., Disp: 60 tablet, Rfl: 0 .  spironolactone (ALDACTONE)  25 MG tablet, Take 0.5 tablets (12.5 mg total) by mouth daily., Disp: 15 tablet, Rfl: 0 .  thiamine 100 MG tablet, Take 1 tablet (100 mg total) by mouth daily., Disp: 30 tablet, Rfl: 0 .  nitroGLYCERIN (NITROSTAT) 0.4 MG SL tablet, Place 1 tablet (0.4 mg total) under the tongue every 5 (five) minutes as needed for chest pain (up to 3 doses). (Patient not taking: Reported on 04/03/2018), Disp: 25 tablet, Rfl: 4    Review of Systems  Constitutional: Negative for fever and unexpected weight change.  HENT: Positive for sneezing. Negative for congestion, dental problem, ear pain, nosebleeds, postnasal drip, rhinorrhea, sinus pressure, sore throat and trouble swallowing.   Eyes: Negative for redness and itching.  Respiratory: Negative for cough, chest tightness, shortness of breath and wheezing.   Cardiovascular: Negative for palpitations and leg swelling.  Gastrointestinal: Negative for nausea and vomiting.  Genitourinary: Negative for dysuria.  Musculoskeletal: Negative for joint swelling.  Skin: Negative for rash.  Allergic/Immunologic: Negative.  Negative for environmental allergies, food allergies and immunocompromised state.  Neurological: Negative for  headaches.  Hematological: Does not bruise/bleed easily.  Psychiatric/Behavioral: Negative for dysphoric mood. The patient is not nervous/anxious.        Objective:   Physical Exam  Constitutional: She is oriented to person, place, and time. She appears well-developed and well-nourished. No distress.  HENT:  Head: Normocephalic and atraumatic.  Right Ear: External ear normal.  Left Ear: External ear normal.  Mouth/Throat: Oropharynx is clear and moist. No oropharyngeal exudate.  Eyes: Pupils are equal, round, and reactive to light. Conjunctivae and EOM are normal. Right eye exhibits no discharge. Left eye exhibits no discharge. No scleral icterus.  Neck: Normal range of motion. Neck supple. No JVD present. No tracheal deviation present. No thyromegaly present.  Cardiovascular: Normal rate, regular rhythm, normal heart sounds and intact distal pulses. Exam reveals no gallop and no friction rub.  No murmur heard. Pulmonary/Chest: Effort normal and breath sounds normal. No respiratory distress. She has no wheezes. She has no rales. She exhibits no tenderness.  Abdominal: Soft. Bowel sounds are normal. She exhibits no distension and no mass. There is no tenderness. There is no rebound and no guarding.  Musculoskeletal: Normal range of motion. She exhibits no edema or tenderness.  Lymphadenopathy:    She has no cervical adenopathy.  Neurological: She is alert and oriented to person, place, and time. She has normal reflexes. No cranial nerve deficit. She exhibits normal muscle tone. Coordination normal.  Skin: Skin is warm and dry. No rash noted. She is not diaphoretic. No erythema. No pallor.  Psychiatric: She has a normal mood and affect. Her behavior is normal. Judgment and thought content normal.  Vitals reviewed.   Today's Vitals   04/03/18 1034  BP: (!) 96/58  Pulse: 68  SpO2: 100%  Weight: 156 lb 6.4 oz (70.9 kg)  Height: 5\' 9"  (1.753 m)    Estimated body mass index is 23.1  kg/m as calculated from the following:   Height as of this encounter: 5\' 9"  (1.753 m).   Weight as of this encounter: 156 lb 6.4 oz (70.9 kg).       Assessment & Plan:     ICD-10-CM   1. Pulmonary emphysema, unspecified emphysema type ([place]) J43.9 Pulmonary function test  2. Multiple lung nodules on CT R91.8 CT Chest Wo Contrast  3. Solitary pulmonary nodule R91.1    Pulmonary emphysema, unspecified emphysema type ([place])   - stop dulera due  to throat intolerance - start spiriva daily  - use albuterol as needed - do full PFT next few to several weeks -glad you quit smoking   Multiple lung nodules on CT - on CT July 2018 - do repeat CT chest wo contrast next few to several weeks  Followup - return to see an APP to review PFT and CT results and respnse to spiriva   - return in few to several weeks    Dr. Brand Males, M.D., [facility].C.P Pulmonary and Critical Care Medicine Staff Physician, [place] Director - Interstitial Lung Disease  Program  Pulmonary [place] [facility] [place], [place], [address]  Pager: [phone], If no answer or between  15:00h - 7:00h: call [phone] Telephone: [phone]

## 2018-04-07 ENCOUNTER — Encounter: Payer: Self-pay | Admitting: Physician Assistant

## 2018-04-07 ENCOUNTER — Encounter (INDEPENDENT_AMBULATORY_CARE_PROVIDER_SITE_OTHER): Payer: Self-pay

## 2018-04-07 ENCOUNTER — Ambulatory Visit (INDEPENDENT_AMBULATORY_CARE_PROVIDER_SITE_OTHER): Payer: Medicare Other | Admitting: Physician Assistant

## 2018-04-07 VITALS — BP 100/56 | HR 73 | Ht 69.5 in | Wt 157.8 lb

## 2018-04-07 DIAGNOSIS — I5022 Chronic systolic (congestive) heart failure: Secondary | ICD-10-CM

## 2018-04-07 DIAGNOSIS — I2510 Atherosclerotic heart disease of native coronary artery without angina pectoris: Secondary | ICD-10-CM | POA: Diagnosis not present

## 2018-04-07 DIAGNOSIS — Z7289 Other problems related to lifestyle: Secondary | ICD-10-CM

## 2018-04-07 DIAGNOSIS — I10 Essential (primary) hypertension: Secondary | ICD-10-CM | POA: Diagnosis not present

## 2018-04-07 DIAGNOSIS — I255 Ischemic cardiomyopathy: Secondary | ICD-10-CM | POA: Diagnosis not present

## 2018-04-07 DIAGNOSIS — F17200 Nicotine dependence, unspecified, uncomplicated: Secondary | ICD-10-CM | POA: Diagnosis not present

## 2018-04-07 DIAGNOSIS — I428 Other cardiomyopathies: Secondary | ICD-10-CM | POA: Diagnosis not present

## 2018-04-07 DIAGNOSIS — Z789 Other specified health status: Secondary | ICD-10-CM

## 2018-04-07 NOTE — Progress Notes (Signed)
Cardiology Office Note    Date:  04/07/2018   ID:  [name], DOB [birth year], MRN [id number]  PCP:  Vicenta Aly, FNP  Cardiologist: Jenkins Rouge, MD  No chief complaint on file.   History of Present Illness:  [name] is a 62 y.o. female with history of CAD status post inferior MI and PCI 2008 with mild to moderate multivessel CAD on cath in 2009, nonischemic cardiomyopathy LVEF 15% status post ICD with improvement in LV function to 50 to 55% in 2016.  She has a history of V. tach in the past, COPD with ongoing tobacco abuse, heavy salt intake, bilateral SFA occlusions.  Patient was discharged from the hospital after recent admission with worsening dyspnea, orthopnea and edema.  LVEF on echo 03/23/2018 down to 20-25 % BNP was elevated at 1069.  Right and left heart catheterization 03/26/2018 done mild nonobstructive CAD with patent stent in the circumflex and mild in-stent restenosis, normal to low left and right heart filling pressures, low fluid cardiac output/index.  Dr. Johnsie Cancel thought a lot of her symptoms were pulmonary related.  She saw pulmonary 04/03/2018 and CT and PFTs were ordered.  She comes in today for follow-up.  She says she quit smoking and drinking after discharge.  She had been drinking over 2-40 ounce beers daily.  She denies any dyspnea dyspnea on exertion dizziness or presyncope.  She feels like a new person.  Past Medical History:  Diagnosis Date  . Acute on chronic systolic heart failure ([place])   . Acute respiratory failure ([place])   . Automatic implantable cardioverter-defibrillator in situ   . CAD (coronary artery disease)    a. Inf-post MI 2008 s/p BMS to large marginal of Cx.   Marland Kitchen COPD (chronic obstructive pulmonary disease) ([place])   . ETOH abuse   . Hyperglycemia   . HYPERLIPIDEMIA-MIXED 02/07/2009  . HYPERTENSION, BENIGN 08/22/2009  . PVD (peripheral vascular disease) ([place])    a. Evaluated by Dr. [name] Anon 08/2012.  Marland Kitchen SYSTOLIC HEART FAILURE,  CHRONIC 02/07/2009   a. EF 30% 2010, 29% by MRI 08/2012. b. s/p prophylactic Medtronic ICD implantation 10/2012.  Marland Kitchen TIA (transient ischemic attack) 05/18/2014  . Tobacco abuse   . VENTRICULAR TACHYCARDIA 05/21/2010    Past Surgical History:  Procedure Laterality Date  . CARDIAC DEFIBRILLATOR PLACEMENT  10/2012  . CESAREAN SECTION  1977; 1982  . CYSTOSCOPY W/ STONE MANIPULATION  1990's  . IMPLANTABLE CARDIOVERTER DEFIBRILLATOR IMPLANT N/A 10/21/2012   Procedure: IMPLANTABLE CARDIOVERTER DEFIBRILLATOR IMPLANT;  Surgeon: Evans Lance, MD;  Location: [facility] CATH LAB;  Service: Cardiovascular;  Laterality: N/A;  . RIGHT/LEFT HEART CATH AND CORONARY ANGIOGRAPHY N/A 03/26/2018   Procedure: RIGHT/LEFT HEART CATH AND CORONARY ANGIOGRAPHY;  Surgeon: Nelva Bush, MD;  Location: [facility] CV LAB;  Service: Cardiovascular;  Laterality: N/A;    Current Medications: Current Meds  Medication Sig  . acetaminophen (TYLENOL) 500 MG tablet Take 500 mg by mouth every 6 (six) hours as needed for headache.   . albuterol (PROAIR HFA) 108 (90 Base) MCG/ACT inhaler Inhale 2 puffs into the lungs every 6 (six) hours as needed for wheezing or shortness of breath.  Marland Kitchen aspirin 81 MG tablet Take 81 mg by mouth daily.  . budesonide-formoterol (SYMBICORT) 160-4.5 MCG/ACT inhaler Inhale 2 puffs into the lungs 2 (two) times daily.  . carvedilol (COREG) 12.5 MG tablet Take 1 tablet (12.5 mg total) by mouth 2 (two) times daily with a meal.  . feeding supplement, ENSURE COMPLETE, (  ENSURE COMPLETE) LIQD Take 237 mLs by mouth daily.  . fluticasone (FLONASE) 50 MCG/ACT nasal spray Place 2 sprays into both nostrils as needed for allergies or rhinitis.  . folic acid (FOLVITE) 1 MG tablet Take 1 tablet (1 mg total) by mouth daily.  . Multiple Vitamin (MULTIVITAMIN WITH MINERALS) TABS tablet Take 1 tablet by mouth daily.  . nicotine (NICODERM CQ - DOSED IN MG/24 HOURS) 14 mg/24hr patch Place 1 patch (14 mg total) onto the skin daily.    . nitroGLYCERIN (NITROSTAT) 0.4 MG SL tablet Place 1 tablet (0.4 mg total) under the tongue every 5 (five) minutes as needed for chest pain (up to 3 doses).  . sacubitril-valsartan (ENTRESTO) 97-103 MG Take 1 tablet by mouth 2 (two) times daily.  Marland Kitchen spironolactone (ALDACTONE) 25 MG tablet Take 0.5 tablets (12.5 mg total) by mouth daily.  Marland Kitchen thiamine 100 MG tablet Take 1 tablet (100 mg total) by mouth daily.  . Tiotropium Bromide Monohydrate (SPIRIVA RESPIMAT) 1.25 MCG/ACT AERS Inhale 2 puffs into the lungs daily.     Allergies:   Clopidogrel bisulfate   Social History   Socioeconomic History  . Marital status: Single    Spouse name: Not on file  . Number of children: Not on file  . Years of education: Not on file  . Highest education level: Not on file  Occupational History  . Not on file  Social Needs  . Financial resource strain: Not on file  . Food insecurity:    Worry: Not on file    Inability: Not on file  . Transportation needs:    Medical: Not on file    Non-medical: Not on file  Tobacco Use  . Smoking status: Former Smoker    Packs/day: 0.50    Years: 40.00    Pack years: 20.00    Types: Cigarettes    Last attempt to quit: 03/22/2018    Years since quitting: 0.0  . Smokeless tobacco: Never Used  . Tobacco comment: pt is on nicotine patches  Substance and Sexual Activity  . Alcohol use: Yes    Alcohol/week: 12.0 oz    Types: 20 Cans of beer per week    Comment: 05/18/2014 "2, 40's qod"  . Drug use: No  . Sexual activity: Yes  Lifestyle  . Physical activity:    Days per week: Not on file    Minutes per session: Not on file  . Stress: Not on file  Relationships  . Social connections:    Talks on phone: Not on file    Gets together: Not on file    Attends religious service: Not on file    Active member of club or organization: Not on file    Attends meetings of clubs or organizations: Not on file    Relationship status: Not on file  Other Topics Concern  .  Not on file  Social History Narrative  . Not on file     Family History:  The patient's family history includes Asthma in her brother; Cancer in her mother; Heart attack in her father; Other in her brother.   ROS:   Please see the history of present illness.    Review of Systems  Constitution: Negative.  HENT: Negative.   Eyes: Negative.   Cardiovascular: Negative.   Respiratory: Negative.   Hematologic/Lymphatic: Negative.   Musculoskeletal: Negative.  Negative for joint pain.  Gastrointestinal: Negative.   Genitourinary: Negative.   Neurological: Negative.    All other systems  reviewed and are negative.   PHYSICAL EXAM:   VS:  BP (!) 100/56   Pulse 73   Ht 5' 9.5" (1.765 m)   Wt 157 lb 12.8 oz (71.6 kg)   BMI 22.97 kg/m   Physical Exam  GEN: Well nourished, well developed, in no acute distress  Neck: no JVD, carotid bruits, or masses Cardiac:RRR; positive S4, 1/6 systolic murmur at the left sternal border respiratory:  clear to auscultation bilaterally, normal work of breathing GI: soft, nontender, nondistended, + BS Ext: Right arm at cath site without hematoma or hemorrhage good radial brachial pulses, lower extremities without cyanosis, clubbing, or edema, Good distal pulses bilaterally Neuro:  Alert and Oriented x 3 Psych: euthymic mood, full affect  Wt Readings from Last 3 Encounters:  04/07/18 157 lb 12.8 oz (71.6 kg)  04/03/18 156 lb 6.4 oz (70.9 kg)  03/27/18 154 lb 1.6 oz (69.9 kg)      Studies/Labs Reviewed:   EKG:  EKG is not ordered today.   Recent Labs: 03/23/2018: B Natriuretic Peptide 1,069.6 03/26/2018: ALT 36 03/27/2018: BUN 14; Creatinine, Ser 0.66; Hemoglobin 16.4; Magnesium 1.9; Platelets 123; Potassium 3.4; Sodium 140   Lipid Panel    Component Value Date/Time   CHOL 130 03/23/2018 0900   CHOL 153 02/24/2017 1154   TRIG 66 03/23/2018 0900   HDL 39 (L) 03/23/2018 0900   HDL 74 02/24/2017 1154   CHOLHDL 3.3 03/23/2018 0900   VLDL 13  03/23/2018 0900   LDLCALC 78 03/23/2018 0900   LDLCALC 67 02/24/2017 1154    Additional studies/ records that were reviewed today include:  Catheterization 7/18/2019Conclusions: 1. Mild, nonobstructive coronary artery disease.  Patent stent in the LCx with mild ISR. 2. Normal to low left and right heart filling pressures. 3. Low Fick cardiac output/index (3.3/1.8). 4. Transient hypotension following sedation, improved with 250 mL normal saline bolus.   Recommendations: 1. Continue goal-directed medical therapy for systolic heart failure due to nonischemic cardiomyopathy. 2. Gentle post-cath hydration given normal to low left and right heart filling pressures.   Recommend indefinite single antiplatelet therapy with aspirin 81mg  daily.  No indication for dual antiplatelet therapy at his time.   Nelva Bush, MD [place] Pager: [phone]     2D echo 7/15/2019Study Conclusions   - Left ventricle: The cavity size was mildly dilated. Wall   thickness was normal. Systolic function was severely reduced. The   estimated ejection fraction was in the range of 25% to 30%.   Diffuse hypokinesis. Doppler parameters are consistent with   abnormal left ventricular relaxation (grade 1 diastolic   dysfunction). - Aortic valve: There was mild regurgitation. - Mitral valve: There was mild regurgitation. - Left atrium: The atrium was mildly dilated.   Impressions:   - Severe global reduction in LV systolic function; mild diastolic   dsyfunction; mild LVE; mild AI and MR; mild LAE.     ASSESSMENT:    1. Nonischemic cardiomyopathy ([place])   2. Atherosclerosis of native coronary artery of native heart without angina pectoris   3. SYSTOLIC HEART FAILURE, CHRONIC   4. Essential hypertension   5. Smoker   6. Alcohol use (HCC)      PLAN:  In order of problems listed above:  Nonischemic cardiomyopathy ejection fraction now down to 25 to 30% on echo.  Admission with CHF but  when she had the cath right heart pressures were low and she required IV fluids.  Currently not on Lasix but compensated.  Continue Aldactone, Entresto and carvedilol.  Follow-up with Dr. Johnsie Cancel in 2 to 3 months  CAD with cardiac cath 03/26/2017 patent stent in the circumflex with mild in-stent restenosis otherwise mild nonobstructive CAD  Chronic systolic CHF compensated continue current medications on hold off on Lasix.  Essential hypertension blood pressure on the low side today but asymptomatic  Smoker she quit 11 days ago and was commended for this  Alcohol use was heavy but she has quit this 11 days ago as well.  Suspect this is the reason for her drop in LVEF.    Medication Adjustments/Labs and Tests Ordered: Current medicines are reviewed at length with the patient today.  Concerns regarding medicines are outlined above.  Medication changes, Labs and Tests ordered today are listed in the Patient Instructions below. Patient Instructions  Medication Instructions: Your physician recommends that you continue on your current medications as directed. Please refer to the Current Medication list given to you today.   Labwork: None Ordered  Procedures/Testing: None Ordered  Follow-Up: Your physician recommends that you schedule a follow-up appointment in: 2-3 months with Dr.Nishan    Any Additional Special Instructions Will Be Listed Below (If Applicable).     If you need a refill on your cardiac medications before your next appointment, please call your pharmacy.      Signed, Ermalinda Barrios, PA-C  04/07/2018 [facility] [place], [place], [place]  [address] Phone: [phone]; Fax: [phone]

## 2018-04-07 NOTE — Patient Instructions (Signed)
Medication Instructions: Your physician recommends that you continue on your current medications as directed. Please refer to the Current Medication list given to you today.   Labwork: None Ordered  Procedures/Testing: None Ordered  Follow-Up: Your physician recommends that you schedule a follow-up appointment in: 2-3 months with Dr.Nishan    Any Additional Special Instructions Will Be Listed Below (If Applicable).     If you need a refill on your cardiac medications before your next appointment, please call your pharmacy.

## 2018-04-08 ENCOUNTER — Ambulatory Visit (INDEPENDENT_AMBULATORY_CARE_PROVIDER_SITE_OTHER)
Admission: RE | Admit: 2018-04-08 | Discharge: 2018-04-08 | Disposition: A | Discharge status: Home / Self Care | Payer: Medicare Other | Source: Ambulatory Visit | Attending: Internal Medicine | Admitting: Internal Medicine

## 2018-04-08 DIAGNOSIS — R918 Other nonspecific abnormal finding of lung field: Secondary | ICD-10-CM

## 2018-04-14 ENCOUNTER — Telehealth: Payer: Self-pay | Admitting: Internal Medicine

## 2018-04-14 DIAGNOSIS — K209 Esophagitis, unspecified without bleeding: Secondary | ICD-10-CM

## 2018-04-14 NOTE — Telephone Encounter (Signed)
   Ct cghest results - prior nodules have resolved. The radiologist saying there is inflammatiion in esophagusu. Please refer to GI

## 2018-04-15 DIAGNOSIS — R945 Abnormal results of liver function studies: Secondary | ICD-10-CM | POA: Diagnosis not present

## 2018-04-15 DIAGNOSIS — E875 Hyperkalemia: Secondary | ICD-10-CM | POA: Diagnosis not present

## 2018-04-16 NOTE — Telephone Encounter (Signed)
Called and spoke with pt letting her know the results of the ct scan and that MR wanted her to see a GI doctor based on the results. Pt expressed understanding. Referral has been placed. Nothing further needed.

## 2018-05-04 ENCOUNTER — Telehealth: Payer: Self-pay | Admitting: Cardiovascular Disease

## 2018-05-04 MED ORDER — CARVEDILOL 12.5 MG PO TABS: 12.5000 mg | ORAL_TABLET | Freq: Two times a day (BID) | ORAL | 3 refills | Status: DC

## 2018-05-04 NOTE — Telephone Encounter (Signed)
New Message     *STAT* If patient is at the pharmacy, call can be transferred to refill team.   1. Which medications need to be refilled? (please list name of each medication and dose if known) carvedilol (COREG) 12.5 MG tablet(Expired) and Entresto   2. Which pharmacy/location (including street and city if local pharmacy) is medication to be sent to? [id number] - [place], [place] - [address]  3. Do they need a 30 day or 90 day supply? [place]

## 2018-05-04 NOTE — Telephone Encounter (Signed)
Pt's medication was sent to pt's pharmacy as requested. Confirmation received.  °

## 2018-05-08 ENCOUNTER — Telehealth: Payer: Self-pay | Admitting: Cardiovascular Disease

## 2018-05-08 MED ORDER — SACUBITRIL-VALSARTAN 97-103 MG PO TABS: 1.0000 | ORAL_TABLET | Freq: Two times a day (BID) | ORAL | 3 refills | Status: DC

## 2018-05-08 NOTE — Telephone Encounter (Signed)
Spoke with patient, she went to get a refill, but the order was deleted from her list at some point. Reordered the medication at her pharmacy.

## 2018-05-08 NOTE — Telephone Encounter (Signed)
Pt is calling requesting a refill on Entresto. This was taking off of the pt's medication list. I do not see where this medication was D/C. Pt stated that she is still taking this medication and would like a refill sent to her pharmacy. Please address

## 2018-05-18 ENCOUNTER — Telehealth: Payer: Self-pay | Admitting: Internal Medicine

## 2018-05-18 NOTE — Telephone Encounter (Signed)
Spoke with pt, advised that I didn't see any notes in the chart that we called her. Pt understood and nothing further is needed.

## 2018-06-08 ENCOUNTER — Encounter: Payer: Self-pay | Admitting: Internal Medicine

## 2018-06-08 ENCOUNTER — Ambulatory Visit (INDEPENDENT_AMBULATORY_CARE_PROVIDER_SITE_OTHER): Payer: Medicare Other | Admitting: Internal Medicine

## 2018-06-08 ENCOUNTER — Encounter: Payer: Self-pay | Admitting: *Deleted

## 2018-06-08 VITALS — BP 110/64 | HR 63 | Ht 67.24 in | Wt 169.6 lb

## 2018-06-08 DIAGNOSIS — R918 Other nonspecific abnormal finding of lung field: Secondary | ICD-10-CM | POA: Diagnosis not present

## 2018-06-08 DIAGNOSIS — J439 Emphysema, unspecified: Secondary | ICD-10-CM

## 2018-06-08 DIAGNOSIS — Z87891 Personal history of nicotine dependence: Secondary | ICD-10-CM | POA: Diagnosis not present

## 2018-06-08 DIAGNOSIS — K209 Esophagitis, unspecified without bleeding: Secondary | ICD-10-CM

## 2018-06-08 DIAGNOSIS — R911 Solitary pulmonary nodule: Secondary | ICD-10-CM | POA: Diagnosis not present

## 2018-06-08 LAB — PULMONARY FUNCTION TEST
DL/VA % pred: 55 %
DL/VA: 2.87 ml/min/mmHg/L
DLCO UNC: 10.9 ml/min/mmHg
DLCO unc % pred: 38 %
FEF 25-75 Post: 1.22 L/sec
FEF 25-75 Pre: 0.97 L/sec
FEF2575-%CHANGE-POST: 25 %
FEF2575-%PRED-POST: 53 %
FEF2575-%Pred-Pre: 42 %
FEV1-%Change-Post: 7 %
FEV1-%PRED-PRE: 64 %
FEV1-%Pred-Post: 69 %
FEV1-POST: 1.64 L
FEV1-Pre: 1.52 L
FEV1FVC-%Change-Post: 7 %
FEV1FVC-%Pred-Pre: 85 %
FEV6-%Change-Post: 0 %
FEV6-%PRED-POST: 76 %
FEV6-%PRED-PRE: 77 %
FEV6-PRE: 2.25 L
FEV6-Post: 2.25 L
FEV6FVC-%CHANGE-POST: 0 %
FEV6FVC-%PRED-PRE: 102 %
FEV6FVC-%Pred-Post: 102 %
FVC-%CHANGE-POST: 0 %
FVC-%PRED-POST: 74 %
FVC-%Pred-Pre: 74 %
FVC-Post: 2.26 L
FVC-Pre: 2.26 L
POST FEV1/FVC RATIO: 73 %
PRE FEV6/FVC RATIO: 100 %
Post FEV6/FVC ratio: 99 %
Pre FEV1/FVC ratio: 67 %
RV % PRED: 97 %
RV: 2.1 L
TLC % pred: 80 %
TLC: 4.44 L

## 2018-06-08 MED ORDER — TIOTROPIUM BROMIDE-OLODATEROL 2.5-2.5 MCG/ACT IN AERS: 2.0000 | INHALATION_SPRAY | Freq: Every day | RESPIRATORY_TRACT | 11 refills | Status: DC

## 2018-06-08 NOTE — Progress Notes (Signed)
IOV 04/03/2018  Chief Complaint  Patient presents with  . Consult    Referred by heart failure. Pt was admitted to the hospital 7/15-7/19 due to resp failure and due to the admission is why pt is here for the consult. Pt was placed on O2 while at the hospital but states she was not sent home on O2. Pt states she does not have any current complaints of cough, SOB, or CP.   [name] , 62 y.o. , with dob 1956-05-10 and female ,Not Hispanic or Latino from [address] [place] [address] - presents to lung clinic for eval of copd. She herself is not sure why she is in lung clinic. History is gained from talking to her and review of the medical record. Her granddaughter is here with her but does not give any history. As best as I can gather she suffers from chronic systolic heart failure and in July 2019 had a heart failure exacerbation and admission. Chest x-ray visualized which was done at that time showed pulmonary congestion. Review of the cardiology notes indicate that she has COPD and a felt she is best served under a pulmonologist. According to the patient she is aware of a COPD diagnosis for the last few years. She was previously on Symbicort but had oral intolerance to it. Therefore after the recent admission cardiologist switched her to [facility] but she does not like that either. Currently she feels improved and at baseline after the heart failure exacerbation in July 2019. She is also taking efforts to quit smoking which she quit 2 weeks ago. She only has mild exertional shortness of breath but no cough or sputum production and she feels overall she's stable. She does not think her COPD inhaler helps her. Review of the chart indicates that she's not had pulmonary function test but the diagnosis of COPD emphysema seen on CT scan of the chest in July 2018 and before. I personally visualized the CT chest. The symptoms score is documented below  There are also multiple nodules on  the lung CT which are visualized.     Results for Karen, [name] (MRN [id number]) as of 04/03/2018 11:11  Ref. Range 03/27/2018 05:52  Creatinine Latest Ref Range: 0.44 - 1.00 mg/dL 0.66  Results for Karen, [name] (MRN [id number]) as of 04/03/2018 11:11  Ref. Range 03/27/2018 05:52  Hemoglobin Latest Ref Range: 12.0 - 15.0 g/dL 16.4 (H)     OV 06/08/2018  Subjective:  Patient ID: [name], female , DOB: May 23, 1956 , age 81 y.o. , MRN: [id number] , ADDRESS: [address] [place] [address]   06/08/2018 -   Chief Complaint  Patient presents with  . Follow-up    PFT completed today     HPI [name] 62 y.o. -follow-up COPD/emphysema and other issues  COPD: She is now on Spiriva.  She says it is helping her but COPD CAT score is unchanged.  Symptom score is 12.  Nevertheless she feels better.  I offered a flu shot but she declined.  She says she is already had a pneumonia shot.  Pulmonary function test today shows Gold stage II COPD with bronchodilator response in the low DLCO.  She wants a letter from [place] saying that she should be able to park right in front of the home because right now the landlord makes a park some distance away and she has to stop because of  dyspnea on exertion and arthritis of the hip  Lung nodules on CT scan of the chest: She had CT scan of the chest that show the lung nodules have resolved  Smoking: She is quit smoking in the last 6 weeks  New issue-CT scan of the chest July 2019 shows esophagitis.  We made a referral to the GI doctor but I do not see any appointment do not know what happened     CAT COPD Symptom & Quality of Life Score ([place] trademark) 0 is no burden. 5 is highest burden 04/03/2018  06/08/2018   Never Cough -> Cough all the time 3 3  No phlegm in chest -> Chest is full of phlegm 5 0  No chest tightness -> Chest feels very tight 0 0  No dyspnea for 1 flight stairs/hill -> Very dyspneic for 1 flight of stairs 0  3  No limitations for ADL at home -> Very limited with ADL at home 1 3  Confident leaving home -> Not at all confident leaving home 0 0  Sleep soundly -> Do not sleep soundly because of lung condition 0 0  Lots of Energy -> No energy at all 1 3  TOTAL Score (max 40)  10 12        Simple office walk 185 feet x  3 laps goal with forehead probe 04/03/2018   O2 used Room air  Number laps completed 3  Comments about pace Normal pace  Resting Pulse Ox/HR 100% and 66/min  Final Pulse Ox/HR 99% and 19/min  Desaturated </= 88% no  Desaturated <= 3% points no  Got Tachycardic >/= 90/min no  Symptoms at end of test none  Miscellaneous comments Normal walk   Results for Karen, [name] (MRN [id number]) as of 06/08/2018 17:05  Ref. Range 06/08/2018 14:41  FEV1-Post Latest Units: L 1.64  FEV1-%Pred-Post Latest Units: % 69  FEV1-%Change-Post Latest Units: % 7  Results for Karen, [name] (MRN [id number]) as of 06/08/2018 17:05  Ref. Range 06/08/2018 14:41  DLCO unc Latest Units: ml/min/mmHg 10.90  DLCO unc % pred Latest Units: % 38    ROS - per HPI     has a past medical history of Acute on chronic systolic heart failure ([place]), Acute respiratory failure ([place]), Automatic implantable cardioverter-defibrillator in situ, CAD (coronary artery disease), COPD (chronic obstructive pulmonary disease) ([place]), ETOH abuse, Hyperglycemia, HYPERLIPIDEMIA-MIXED (02/07/2009), HYPERTENSION, BENIGN (08/22/2009), PVD (peripheral vascular disease) ([place]), SYSTOLIC HEART FAILURE, CHRONIC (02/07/2009), TIA (transient ischemic attack) (05/18/2014), Tobacco abuse, and VENTRICULAR TACHYCARDIA (05/21/2010).   reports that she quit smoking about 6 weeks ago. Her smoking use included cigarettes. She has a 20.00 pack-year smoking history. She has never used smokeless tobacco.  Past Surgical History:  Procedure Laterality Date  . CARDIAC DEFIBRILLATOR PLACEMENT  10/2012  . CESAREAN SECTION  1977; 1982  . CYSTOSCOPY W/ STONE  MANIPULATION  1990's  . IMPLANTABLE CARDIOVERTER DEFIBRILLATOR IMPLANT N/A 10/21/2012   Procedure: IMPLANTABLE CARDIOVERTER DEFIBRILLATOR IMPLANT;  Surgeon: Evans Lance, MD;  Location: [facility] CATH LAB;  Service: Cardiovascular;  Laterality: N/A;  . RIGHT/LEFT HEART CATH AND CORONARY ANGIOGRAPHY N/A 03/26/2018   Procedure: RIGHT/LEFT HEART CATH AND CORONARY ANGIOGRAPHY;  Surgeon: Nelva Bush, MD;  Location: [facility] LAB;  Service: Cardiovascular;  Laterality: N/A;    Allergies  Allergen Reactions  . Clopidogrel Bisulfate Rash    Immunization History  Administered Date(s) Administered  . Influenza,inj,Quad PF,6+ Mos 12/03/2014  . Pneumococcal Polysaccharide-23 12/03/2014    Family  History  Problem Relation Age of Onset  . Cancer Mother        cervical  . Heart attack Father   . Asthma Brother   . Other Brother        Pacemaker     Current Outpatient Medications:  .  acetaminophen (TYLENOL) 500 MG tablet, Take 500 mg by mouth every 6 (six) hours as needed for headache. , Disp: , Rfl:  .  aspirin 81 MG tablet, Take 81 mg by mouth daily., Disp: , Rfl:  .  carvedilol (COREG) 12.5 MG tablet, Take 1 tablet (12.5 mg total) by mouth 2 (two) times daily with a meal., Disp: 180 tablet, Rfl: 3 .  feeding supplement, ENSURE COMPLETE, (ENSURE COMPLETE) LIQD, Take 237 mLs by mouth daily., Disp: , Rfl:  .  fluticasone (FLONASE) 50 MCG/ACT nasal spray, Place 2 sprays into both nostrils as needed for allergies or rhinitis., Disp: , Rfl:  .  nitroGLYCERIN (NITROSTAT) 0.4 MG SL tablet, Place 1 tablet (0.4 mg total) under the tongue every 5 (five) minutes as needed for chest pain (up to 3 doses)., Disp: 25 tablet, Rfl: 4 .  sacubitril-valsartan (ENTRESTO) 97-103 MG, Take 1 tablet by mouth 2 (two) times daily., Disp: 180 tablet, Rfl: 3 .  Tiotropium Bromide Monohydrate (SPIRIVA RESPIMAT) 1.25 MCG/ACT AERS, Inhale 2 puffs into the lungs daily., Disp: 1 Inhaler, Rfl: 0 .  albuterol (PROAIR HFA)  108 (90 Base) MCG/ACT inhaler, Inhale 2 puffs into the lungs every 6 (six) hours as needed for wheezing or shortness of breath., Disp: 1 Inhaler, Rfl: 0 .  budesonide-formoterol (SYMBICORT) 160-4.5 MCG/ACT inhaler, Inhale 2 puffs into the lungs 2 (two) times daily., Disp: 6 g, Rfl: 0 .  folic acid (FOLVITE) 1 MG tablet, TAKE ONE TABLET (1 MG DOSE) BY MOUTH DAILY., Disp: , Rfl: 1 .  spironolactone (ALDACTONE) 25 MG tablet, Take 0.5 tablets (12.5 mg total) by mouth daily., Disp: 15 tablet, Rfl: 0      Objective:   Vitals:   06/08/18 1639  BP: 110/64  Pulse: 63  SpO2: 98%  Weight: 169 lb 9.6 oz (76.9 kg)  Height: 5' 7.24" (1.708 m)    Estimated body mass index is 26.37 kg/m as calculated from the following:   Height as of this encounter: 5' 7.24" (1.708 m).   Weight as of this encounter: 169 lb 9.6 oz (76.9 kg).  @WEIGHTCHANGE @  [employer]   06/08/18 1639  Weight: 169 lb 9.6 oz (76.9 kg)     Physical Exam  General Appearance:    Alert, cooperative, no distress, appears stated age -older, Deconditioned looking - no , OBESE  - no, Sitting on Wheelchair -  no  Head:    Normocephalic, without obvious abnormality, atraumatic  Eyes:    PERRL, conjunctiva/corneas clear,  Ears:    Normal TM's and external ear canals, both ears  Nose:   Nares normal, septum midline, mucosa normal, no drainage    or sinus tenderness. OXYGEN ON  - no . Patient is @ ra   Throat:   Lips, mucosa, and tongue normal; teeth and gums normal. Cyanosis on lips - no  Neck:   Supple, symmetrical, trachea midline, no adenopathy;    thyroid:  no enlargement/tenderness/nodules; no carotid   bruit or JVD  Back:     Symmetric, no curvature, ROM normal, no CVA tenderness  Lungs:     Distress - no , Wheeze no, Barrell Chest - mild yes, Purse lip breathing -  no, Crackles - no   Chest Wall:    No tenderness or deformity.    Heart:    Regular rate and rhythm, S1 and S2 normal, no rub   or gallop, Murmur - no  Breast  Exam:    NOT DONE  Abdomen:     Soft, non-tender, bowel sounds active all four quadrants,    no masses, no organomegaly. Visceral obesity - no  Genitalia:   NOT DONE  Rectal:   NOT DONE  Extremities:   Extremities - normal, Has Cane - no, Clubbing - no, Edema - no  Pulses:   2+ and symmetric all extremities  Skin:   Stigmata of Connective Tissue Disease - no  Lymph nodes:   Cervical, supraclavicular, and axillary nodes normal  Psychiatric:  Neurologic:   Pleasant - yes, Anxious - no, Flat affect - no  CAm-ICU - neg, Alert and Oriented x 3 - yes, Moves all 4s - yes, Speech - normal, Cognition - intact           Assessment:       ICD-10-CM   1. Pulmonary emphysema, unspecified emphysema type ([place]) J43.9   2. Multiple lung nodules on CT R91.8   3. Quit smoking Z87.891   4. Solitary pulmonary nodule R91.1   5. Esophagitis K20.9        Plan:     Patient Instructions  Pulmonary emphysema, unspecified emphysema type ([place])  - You have moderate COPD on breathing test -Symptoms of the same despite Spiriva -Change Spiriva to Stiolto and take that daily -Respect flu shot deferral -Take letter to your landlord that you need to park in front of your house because of his COPD   Multiple lung nodules on CT  -This is resolved on CT scan July 2019 -He might qualify for lung cancer screening test in the summer 2020 and we can address it at that time  Quit smoking  -Congratulations on quitting smoking in August 2019    Esophagitis -This was seen on CT scan July 2019 -We will refer to the gastroenterologist especially because he has some swallowing difficulties  Follow-up -6 months or sooner if needed; CAT score at follow-up      SIGNATURE    Dr. Brand Males, M.D., F.C.C.P,  Pulmonary and Critical Care Medicine Staff Physician, [place] Director - Interstitial Lung Disease  Program  Pulmonary [place] [facility] [place], [place], [address]  Pager: [phone], If no answer or between  15:00h - 7:00h: call [phone] Telephone: [phone]  5:18 PM 06/08/2018

## 2018-06-08 NOTE — Addendum Note (Signed)
Addended by: Lorretta Harp on: 06/08/2018 05:22 PM   Modules accepted: Orders

## 2018-06-08 NOTE — Progress Notes (Signed)
PFT completed today. 06/08/18

## 2018-06-08 NOTE — Addendum Note (Signed)
Addended by: Lorretta Harp on: 06/08/2018 05:21 PM   Modules accepted: Orders

## 2018-06-08 NOTE — Patient Instructions (Signed)
Pulmonary emphysema, unspecified emphysema type ([place])  - You have moderate COPD on breathing test -Symptoms of the same despite Spiriva -Change Spiriva to Stiolto and take that daily -Respect flu shot deferral -Take letter to your landlord that you need to park in front of your house because of his COPD   Multiple lung nodules on CT  -This is resolved on CT scan July 2019 -He might qualify for lung cancer screening test in the summer 2020 and we can address it at that time  Quit smoking  -Congratulations on quitting smoking in August 2019    Esophagitis -This was seen on CT scan July 2019 -We will refer to the gastroenterologist especially because he has some swallowing difficulties  Follow-up -6 months or sooner if needed; CAT score at follow-up

## 2018-06-09 ENCOUNTER — Encounter: Payer: Self-pay | Admitting: Internal Medicine

## 2018-06-10 DIAGNOSIS — K838 Other specified diseases of biliary tract: Secondary | ICD-10-CM | POA: Diagnosis not present

## 2018-06-16 ENCOUNTER — Ambulatory Visit (INDEPENDENT_AMBULATORY_CARE_PROVIDER_SITE_OTHER): Payer: Medicare Other | Admitting: *Deleted

## 2018-06-16 DIAGNOSIS — I428 Other cardiomyopathies: Secondary | ICD-10-CM | POA: Diagnosis not present

## 2018-06-17 NOTE — Progress Notes (Signed)
Remote ICD transmission.   

## 2018-06-25 ENCOUNTER — Encounter: Payer: Self-pay | Admitting: Cardiology

## 2018-07-09 LAB — CUP PACEART REMOTE DEVICE CHECK
Battery Remaining Longevity: 82 mo
Battery Voltage: 3 V
Brady Statistic RV Percent Paced: 0.33 %
HighPow Impedance: 73 Ohm
Implantable Lead Location: 753860
Implantable Lead Model: 6935
Implantable Pulse Generator Implant Date: 20140212
Lead Channel Impedance Value: 494 Ohm
Lead Channel Pacing Threshold Pulse Width: 0.4 ms
Lead Channel Sensing Intrinsic Amplitude: 9.5 mV
Lead Channel Setting Pacing Amplitude: 2.5 V
Lead Channel Setting Pacing Pulse Width: 0.4 ms
Lead Channel Setting Sensing Sensitivity: 0.3 mV
MDC IDC LEAD IMPLANT DT: 20140212
MDC IDC MSMT LEADCHNL RV IMPEDANCE VALUE: 437 Ohm
MDC IDC MSMT LEADCHNL RV PACING THRESHOLD AMPLITUDE: 0.5 V
MDC IDC MSMT LEADCHNL RV SENSING INTR AMPL: 9.5 mV
MDC IDC SESS DTM: 20191008122307

## 2018-07-13 NOTE — Progress Notes (Signed)
Cardiology Office Note    Date:  07/22/2018   ID:  [name], DOB 02/19/1956, MRN [id number]  PCP:  Vicenta Aly, FNP  Cardiologist: Jenkins Rouge, MD  No chief complaint on file.   History of Present Illness:  [name] is a 62 y.o. female with history of CAD status post inferior MI and PCI 2008 with mild to moderate multivessel CAD on cath in 2009, nonischemic cardiomyopathy LVEF 15% status post ICD with improvement in LV function to 50 to 55% in 2016.  She has a history of V. tach in the past, COPD with ongoing tobacco abuse, heavy salt intake, bilateral SFA occlusions.  Patient was discharged from the hospital July 2019 admission with worsening dyspnea, orthopnea and edema.  LVEF on echo 03/23/2018 down to 20-25 % BNP was elevated at 1069.  Right and left heart catheterization 03/26/2018 done mild nonobstructive CAD with patent stent in the circumflex and mild in-stent restenosis, normal to low left and right heart filling pressures, low fluid cardiac output/index.   She saw pulmonary 04/03/2018 and CT and PFTs were ordered.Was drinking and smoking a lot prior to admission. When seen by PA 04/07/18 indicated felt much better after abstaining   Seen by pulmonary 06/08/18 Gold stage 2 and on Spiriva now  CT July 2019 showed esophagitis referred to GI   She admits to drinking again the last month I discussed the link with cardiomyopathy With her Husband indicates he will make her stop     Past Medical History:  Diagnosis Date  . Acute on chronic systolic heart failure ([place])   . Acute respiratory failure ([place])   . Automatic implantable cardioverter-defibrillator in situ   . CAD (coronary artery disease)    a. Inf-post MI 2008 s/p BMS to large marginal of Cx.   Marland Kitchen COPD (chronic obstructive pulmonary disease) ([place])   . ETOH abuse   . Hyperglycemia   . HYPERLIPIDEMIA-MIXED 02/07/2009  . HYPERTENSION, BENIGN 08/22/2009  . PVD (peripheral vascular disease) ([place])    a.  Evaluated by Dr. [name] Anon 08/2012.  Marland Kitchen SYSTOLIC HEART FAILURE, CHRONIC 02/07/2009   a. EF 30% 2010, 29% by MRI 08/2012. b. s/p prophylactic Medtronic ICD implantation 10/2012.  Marland Kitchen TIA (transient ischemic attack) 05/18/2014  . Tobacco abuse   . VENTRICULAR TACHYCARDIA 05/21/2010    Past Surgical History:  Procedure Laterality Date  . CARDIAC DEFIBRILLATOR PLACEMENT  10/2012  . CESAREAN SECTION  1977; 1982  . COLONOSCOPY    . CYSTOSCOPY W/ STONE MANIPULATION  1990's  . IMPLANTABLE CARDIOVERTER DEFIBRILLATOR IMPLANT N/A 10/21/2012   Procedure: IMPLANTABLE CARDIOVERTER DEFIBRILLATOR IMPLANT;  Surgeon: Evans Lance, MD;  Location: [facility] CATH LAB;  Service: Cardiovascular;  Laterality: N/A;  . RIGHT/LEFT HEART CATH AND CORONARY ANGIOGRAPHY N/A 03/26/2018   Procedure: RIGHT/LEFT HEART CATH AND CORONARY ANGIOGRAPHY;  Surgeon: Nelva Bush, MD;  Location: [place] CV LAB;  Service: Cardiovascular;  Laterality: N/A;    Current Medications: Current Meds  Medication Sig  . acetaminophen (TYLENOL) 500 MG tablet Take 500 mg by mouth every 6 (six) hours as needed for headache.   . albuterol (PROAIR HFA) 108 (90 Base) MCG/ACT inhaler Inhale 2 puffs into the lungs every 6 (six) hours as needed for wheezing or shortness of breath.  Marland Kitchen aspirin 81 MG tablet Take 81 mg by mouth daily.  . budesonide-formoterol (SYMBICORT) 160-4.5 MCG/ACT inhaler Inhale 2 puffs into the lungs 2 (two) times daily.  . carvedilol (COREG) 12.5 MG tablet Take 1 tablet (  12.5 mg total) by mouth 2 (two) times daily with a meal.  . feeding supplement, ENSURE COMPLETE, (ENSURE COMPLETE) LIQD Take 237 mLs by mouth daily.  . fluticasone (FLONASE) 50 MCG/ACT nasal spray Place 2 sprays into both nostrils as needed for allergies or rhinitis.  . folic acid (FOLVITE) 1 MG tablet TAKE ONE TABLET (1 MG DOSE) BY MOUTH DAILY.  . nitroGLYCERIN (NITROSTAT) 0.4 MG SL tablet Place 1 tablet (0.4 mg total) under the tongue every 5 (five) minutes as needed for  chest pain (up to 3 doses).  . sacubitril-valsartan (ENTRESTO) 97-103 MG Take 1 tablet by mouth 2 (two) times daily.  Marland Kitchen spironolactone (ALDACTONE) 25 MG tablet Take 0.5 tablets (12.5 mg total) by mouth daily.  . Tiotropium Bromide-Olodaterol (STIOLTO RESPIMAT) 2.5-2.5 MCG/ACT AERS Inhale 2 puffs into the lungs daily.     Allergies:   Clopidogrel bisulfate   Social History   Socioeconomic History  . Marital status: Single    Spouse name: Not on file  . Number of children: Not on file  . Years of education: Not on file  . Highest education level: Not on file  Occupational History  . Not on file  Social Needs  . Financial resource strain: Not on file  . Food insecurity:    Worry: Not on file    Inability: Not on file  . Transportation needs:    Medical: Not on file    Non-medical: Not on file  Tobacco Use  . Smoking status: Former Smoker    Packs/day: 0.50    Years: 40.00    Pack years: 20.00    Types: Cigarettes    Last attempt to quit: 04/27/2018    Years since quitting: 0.2  . Smokeless tobacco: Never Used  . Tobacco comment: pt is on nicotine patches  Substance and Sexual Activity  . Alcohol use: Yes    Alcohol/week: 20.0 standard drinks    Types: 20 Cans of beer per week    Comment: 05/18/2014 "2, 40's qod"  . Drug use: No  . Sexual activity: Yes  Lifestyle  . Physical activity:    Days per week: Not on file    Minutes per session: Not on file  . Stress: Not on file  Relationships  . Social connections:    Talks on phone: Not on file    Gets together: Not on file    Attends religious service: Not on file    Active member of club or organization: Not on file    Attends meetings of clubs or organizations: Not on file    Relationship status: Not on file  Other Topics Concern  . Not on file  Social History Narrative   Single - disabled - last job at [employer]   + alcohol no smoking no substances     Family History:  The patient's family history  includes Asthma in her brother; Cancer in her mother; Heart attack in her father; Other in her brother.   ROS:   Please see the history of present illness.    Review of Systems  Constitution: Negative.  HENT: Negative.   Eyes: Negative.   Cardiovascular: Negative.   Respiratory: Negative.   Hematologic/Lymphatic: Negative.   Musculoskeletal: Negative.  Negative for joint pain.  Gastrointestinal: Negative.   Genitourinary: Negative.   Neurological: Negative.    All other systems reviewed and are negative.   PHYSICAL EXAM:   VS:  BP 122/62   Pulse 69   Ht  5' 9.5" (1.765 m)   Wt 176 lb 8 oz (80.1 kg)   SpO2 97%   BMI 25.69 kg/m   Physical Exam  Affect appropriate Chronically ill female  HEENT: normal Neck supple with no adenopathy JVP normal no bruits no thyromegaly Lungs COPD no active wheezing  Heart:  S1/S2 no murmur, no rub, gallop or click PMI normal Abdomen: benighn, BS positve, no tenderness, no AAA no bruit.  No HSM or HJR Distal pulses intact with no bruits No edema Neuro non-focal Skin warm and dry No muscular weakness   Wt Readings from Last 3 Encounters:  07/22/18 176 lb 8 oz (80.1 kg)  07/20/18 172 lb (78 kg)  06/08/18 169 lb 9.6 oz (76.9 kg)      Studies/Labs Reviewed:   EKG:  EKG is not ordered today.   Recent Labs: 03/23/2018: B Natriuretic Peptide 1,069.6 03/26/2018: ALT 36 03/27/2018: BUN 14; Creatinine, Ser 0.66; Hemoglobin 16.4; Magnesium 1.9; Platelets 123; Potassium 3.4; Sodium 140   Lipid Panel    Component Value Date/Time   CHOL 130 03/23/2018 0900   CHOL 153 02/24/2017 1154   TRIG 66 03/23/2018 0900   HDL 39 (L) 03/23/2018 0900   HDL 74 02/24/2017 1154   CHOLHDL 3.3 03/23/2018 0900   VLDL 13 03/23/2018 0900   LDLCALC 78 03/23/2018 0900   LDLCALC 67 02/24/2017 1154    Additional studies/ records that were reviewed today include:  Catheterization 7/18/2019Conclusions: 1. Mild, nonobstructive coronary artery disease.  Patent  stent in the LCx with mild ISR. 2. Normal to low left and right heart filling pressures. 3. Low Fick cardiac output/index (3.3/1.8). 4. Transient hypotension following sedation, improved with 250 mL normal saline bolus.   Recommendations: 1. Continue goal-directed medical therapy for systolic heart failure due to nonischemic cardiomyopathy. 2. Gentle post-cath hydration given normal to low left and right heart filling pressures.   Recommend indefinite single antiplatelet therapy with aspirin 81mg  daily.  No indication for dual antiplatelet therapy at his time.   Nelva Bush, MD [place] Pager: [phone]     2D echo 7/15/2019Study Conclusions   - Left ventricle: The cavity size was mildly dilated. Wall   thickness was normal. Systolic function was severely reduced. The   estimated ejection fraction was in the range of 25% to 30%.   Diffuse hypokinesis. Doppler parameters are consistent with   abnormal left ventricular relaxation (grade 1 diastolic   dysfunction). - Aortic valve: There was mild regurgitation. - Mitral valve: There was mild regurgitation. - Left atrium: The atrium was mildly dilated.   Impressions:   - Severe global reduction in LV systolic function; mild diastolic   dsyfunction; mild LVE; mild AI and MR; mild LAE.     ASSESSMENT:    No diagnosis found.   PLAN:  In order of problems listed above:  Nonischemic cardiomyopathy ejection fraction now down to 25 to 30% on echo 03/23/18 .  Admission with CHF but when she had the cath right heart pressures were low and she required IV fluids.  Currently not on Lasix but compensated.  Continue Aldactone, Entresto and carvedilol.  Follow-up with me in 6 months with TTE Discussed the need to stop drinking ETOH  CAD with cardiac cath 03/26/2018 patent stent in the circumflex with mild in-stent restenosis otherwise mild nonobstructive CAD  Chronic systolic CHF compensated continue current medications on  hold off on Lasix.  HTN:  Well controlled.  Continue current medications and low sodium Dash type diet.  Smoker Quit July 2019 Gold 2 COPD f/u pulmonary   Alcohol use was heavy but she has quit this July 2019 as well.  Suspect this is the reason for her drop in LVEF.    Medication Adjustments/Labs and Tests Ordered: Current medicines are reviewed at length with the patient today.  Concerns regarding medicines are outlined above.  Medication changes, Labs and Tests ordered today are listed in the Patient Instructions below. Patient Instructions  Medication Instructions:   If you need a refill on your cardiac medications before your next appointment, please call your pharmacy.   Lab work:  If you have labs (blood work) drawn today and your tests are completely normal, you will receive your results only by: Marland Kitchen MyChart Message (if you have MyChart) OR . A paper copy in the mail If you have any lab test that is abnormal or we need to change your treatment, we will call you to review the results.  Testing/Procedures: Your physician has requested that you have an echocardiogram. Echocardiography is a painless test that uses sound waves to create images of your heart. It provides your doctor with information about the size and shape of your heart and how well your heart's chambers and valves are working. This procedure takes approximately one hour. There are no restrictions for this procedure.  Follow-Up: At [facility], you and your health needs are our priority.  As part of our continuing mission to provide you with exceptional heart care, we have created designated Provider Care Teams.  These Care Teams include your primary Cardiologist (physician) and Advanced Practice Providers (APPs -  Physician Assistants and Nurse Practitioners) who all work together to provide you with the care you need, when you need it. You will need a follow up appointment in 6 months.  Please call our office 2  months in advance to schedule this appointment.  You may see Jenkins Rouge, MD or one of the following Advanced Practice Providers on your designated Care Team:   Truitt Merle, NP Cecilie Kicks, NP . Kathyrn Drown, NP    Signed, Jenkins Rouge, MD  07/22/2018 2:04 PM    [facility] [place], [place],   [id number] Phone: [phone]; Fax: [phone]

## 2018-07-20 ENCOUNTER — Encounter: Payer: Self-pay | Admitting: Internal Medicine

## 2018-07-20 ENCOUNTER — Ambulatory Visit (INDEPENDENT_AMBULATORY_CARE_PROVIDER_SITE_OTHER): Payer: Medicare Other | Admitting: Internal Medicine

## 2018-07-20 VITALS — BP 100/58 | HR 59 | Ht 69.5 in | Wt 172.0 lb

## 2018-07-20 DIAGNOSIS — R1319 Other dysphagia: Secondary | ICD-10-CM

## 2018-07-20 DIAGNOSIS — R933 Abnormal findings on diagnostic imaging of other parts of digestive tract: Secondary | ICD-10-CM

## 2018-07-20 DIAGNOSIS — R1310 Dysphagia, unspecified: Secondary | ICD-10-CM | POA: Diagnosis not present

## 2018-07-20 NOTE — Patient Instructions (Signed)
  You have been scheduled for an endoscopy. Please follow written instructions given to you at your visit today. If you use inhalers (even only as needed), please bring them with you on the day of your procedure.   I appreciate the opportunity to care for you. Carl Gessner, MD, FACG 

## 2018-07-20 NOTE — H&P (View-Only) (Signed)
[name] 62 y.o. December 28, 1955 [id number] Referred by Dr. Ann Lions Assessment & Plan:   Encounter Diagnoses  Name Primary?  . Esophageal dysphagia Yes  . Abnormal CT scan, esophagus     Will do on 11/20 at 11AM at [facility] - her EF is low and has AICD so class 4 ASA and need to be at hospital not ASC  The risks and benefits as well as alternatives of endoscopic procedure(s) have been discussed and reviewed. All questions answered. The patient agrees to proceed.  I appreciate the opportunity to care for you. Tedra Senegal, FNP Dr. Brand Males    Subjective:   Chief Complaint: abnormal esophagus on CT and dysphagia  HPI 62 yo AA woman with CHF, s/p AICD and had CT chest earlier this year (04/2018) with distal esophageal thickening and she has been telling Dr. Chase Caller that she has difficulty swallowing some foods (apples, e.g.) occasionally. No heartburn or reflux to speak of. Does have hx pulmonary nodules that are improved. She does have occasional ? Of liquids "going down the wrong pipe" Her resp status is better since admit to hospital in July though EF went down - had R/L heart cath also thought there was COPD component to this as well as CHF Recently quit smoking Has not been on PPI  Had screening colonoscopy 2010 w/ left hyperplastic polyp and a ganglioneuroma polyp of cecum  Seeing Dr. Johnsie Cancel soon Wt Readings from Last 3 Encounters:  07/20/18 172 lb (78 kg)  06/08/18 169 lb 9.6 oz (76.9 kg)  04/07/18 157 lb 12.8 oz (71.6 kg)     Allergies  Allergen Reactions  . Clopidogrel Bisulfate Rash   Current Meds  Medication Sig  . acetaminophen (TYLENOL) 500 MG tablet Take 500 mg by mouth every 6 (six) hours as needed for headache.   Marland Kitchen aspirin 81 MG tablet Take 81 mg by mouth daily.  . carvedilol (COREG) 12.5 MG tablet Take 1 tablet (12.5 mg total) by mouth 2 (two) times daily with a meal.  . feeding supplement, ENSURE COMPLETE, (ENSURE COMPLETE) LIQD  Take 237 mLs by mouth daily.  . fluticasone (FLONASE) 50 MCG/ACT nasal spray Place 2 sprays into both nostrils as needed for allergies or rhinitis.  . folic acid (FOLVITE) 1 MG tablet TAKE ONE TABLET (1 MG DOSE) BY MOUTH DAILY.  . nitroGLYCERIN (NITROSTAT) 0.4 MG SL tablet Place 1 tablet (0.4 mg total) under the tongue every 5 (five) minutes as needed for chest pain (up to 3 doses).  . sacubitril-valsartan (ENTRESTO) 97-103 MG Take 1 tablet by mouth 2 (two) times daily.  . Tiotropium Bromide-Olodaterol (STIOLTO RESPIMAT) 2.5-2.5 MCG/ACT AERS Inhale 2 puffs into the lungs daily.   Past Medical History:  Diagnosis Date  . Acute on chronic systolic heart failure ([place])   . Acute respiratory failure ([place])   . Automatic implantable cardioverter-defibrillator in situ   . CAD (coronary artery disease)    a. Inf-post MI 2008 s/p BMS to large marginal of Cx.   Marland Kitchen COPD (chronic obstructive pulmonary disease) ([place])   . ETOH abuse   . Hyperglycemia   . HYPERLIPIDEMIA-MIXED 02/07/2009  . HYPERTENSION, BENIGN 08/22/2009  . PVD (peripheral vascular disease) ([place])    a. Evaluated by Dr. [name] Anon 08/2012.  Marland Kitchen SYSTOLIC HEART FAILURE, CHRONIC 02/07/2009   a. EF 30% 2010, 29% by MRI 08/2012. b. s/p prophylactic Medtronic ICD implantation 10/2012.  Marland Kitchen TIA (transient ischemic attack) 05/18/2014  . Tobacco abuse   . VENTRICULAR TACHYCARDIA  05/21/2010   Past Surgical History:  Procedure Laterality Date  . CARDIAC DEFIBRILLATOR PLACEMENT  10/2012  . CESAREAN SECTION  1977; 1982  . COLONOSCOPY    . CYSTOSCOPY W/ STONE MANIPULATION  1990's  . IMPLANTABLE CARDIOVERTER DEFIBRILLATOR IMPLANT N/A 10/21/2012   Procedure: IMPLANTABLE CARDIOVERTER DEFIBRILLATOR IMPLANT;  Surgeon: Evans Lance, MD;  Location: [facility] CATH LAB;  Service: Cardiovascular;  Laterality: N/A;  . RIGHT/LEFT HEART CATH AND CORONARY ANGIOGRAPHY N/A 03/26/2018   Procedure: RIGHT/LEFT HEART CATH AND CORONARY ANGIOGRAPHY;  Surgeon: Nelva Bush, MD;  Location:  [place] CV LAB;  Service: Cardiovascular;  Laterality: N/A;   Social History   Social History Narrative   Single - disabled - last job at [employer]   + alcohol no smoking no substances   family history includes Asthma in her brother; Cancer in her mother; Heart attack in her father; Other in her brother.   Review of Systems As above some left ankle edema O/w all negative Objective:   Physical Exam @BP  (!) 100/58   Pulse (!) 59   Ht 5' 9.5" (1.765 m)   Wt 172 lb (78 kg)   BMI 25.04 kg/m @  General:  Well-developed, well-nourished and in no acute distress Eyes:  anicteric. ENT:   Mouth and posterior pharynx free of lesions.  Neck:   supple w/o thyromegaly or mass.  Lungs: Clear to auscultation bilaterally. Heart:  S1S2, no rubs, murmurs, gallops. distanty heart sounds Abdomen:  soft, non-tender, no hepatosplenomegaly, hernia, or mass and BS+.  Lymph:  no cervical or supraclavicular adenopathy. Extremities:   no edema, cyanosis or clubbing Skin   no rash. Neuro:  A&O x 3.  Psych:  appropriate mood and  Affect.   Data Reviewed: See HPI I have reviewed labs in [facility] notes Cardiology and pulmonary notes Echo and cardiac cath

## 2018-07-20 NOTE — Progress Notes (Signed)
[name] 62 y.o. 1956-03-31 [id number] Referred by Dr. Ann Lions Assessment & Plan:   Encounter Diagnoses  Name Primary?  . Esophageal dysphagia Yes  . Abnormal CT scan, esophagus     Will do on 11/20 at 11AM at [facility] - her EF is low and has AICD so class 4 ASA and need to be at hospital not ASC  The risks and benefits as well as alternatives of endoscopic procedure(s) have been discussed and reviewed. All questions answered. The patient agrees to proceed.  I appreciate the opportunity to care for you. Tedra Senegal, FNP Dr. Brand Males    Subjective:   Chief Complaint: abnormal esophagus on CT and dysphagia  HPI 62 yo AA woman with CHF, s/p AICD and had CT chest earlier this year (04/2018) with distal esophageal thickening and she has been telling Dr. Chase Caller that she has difficulty swallowing some foods (apples, e.g.) occasionally. No heartburn or reflux to speak of. Does have hx pulmonary nodules that are improved. She does have occasional ? Of liquids "going down the wrong pipe" Her resp status is better since admit to hospital in July though EF went down - had R/L heart cath also thought there was COPD component to this as well as CHF Recently quit smoking Has not been on PPI  Had screening colonoscopy 2010 w/ left hyperplastic polyp and a ganglioneuroma polyp of cecum  Seeing Dr. Johnsie Cancel soon Wt Readings from Last 3 Encounters:  07/20/18 172 lb (78 kg)  06/08/18 169 lb 9.6 oz (76.9 kg)  04/07/18 157 lb 12.8 oz (71.6 kg)     Allergies  Allergen Reactions  . Clopidogrel Bisulfate Rash   Current Meds  Medication Sig  . acetaminophen (TYLENOL) 500 MG tablet Take 500 mg by mouth every 6 (six) hours as needed for headache.   Marland Kitchen aspirin 81 MG tablet Take 81 mg by mouth daily.  . carvedilol (COREG) 12.5 MG tablet Take 1 tablet (12.5 mg total) by mouth 2 (two) times daily with a meal.  . feeding supplement, ENSURE COMPLETE, (ENSURE COMPLETE) LIQD  Take 237 mLs by mouth daily.  . fluticasone (FLONASE) 50 MCG/ACT nasal spray Place 2 sprays into both nostrils as needed for allergies or rhinitis.  . folic acid (FOLVITE) 1 MG tablet TAKE ONE TABLET (1 MG DOSE) BY MOUTH DAILY.  . nitroGLYCERIN (NITROSTAT) 0.4 MG SL tablet Place 1 tablet (0.4 mg total) under the tongue every 5 (five) minutes as needed for chest pain (up to 3 doses).  . sacubitril-valsartan (ENTRESTO) 97-103 MG Take 1 tablet by mouth 2 (two) times daily.  . Tiotropium Bromide-Olodaterol (STIOLTO RESPIMAT) 2.5-2.5 MCG/ACT AERS Inhale 2 puffs into the lungs daily.   Past Medical History:  Diagnosis Date  . Acute on chronic systolic heart failure ([place])   . Acute respiratory failure ([place])   . Automatic implantable cardioverter-defibrillator in situ   . CAD (coronary artery disease)    a. Inf-post MI 2008 s/p BMS to large marginal of Cx.   Marland Kitchen COPD (chronic obstructive pulmonary disease) ([place])   . ETOH abuse   . Hyperglycemia   . HYPERLIPIDEMIA-MIXED 02/07/2009  . HYPERTENSION, BENIGN 08/22/2009  . PVD (peripheral vascular disease) ([place])    a. Evaluated by Dr. [name] Anon 08/2012.  Marland Kitchen SYSTOLIC HEART FAILURE, CHRONIC 02/07/2009   a. EF 30% 2010, 29% by MRI 08/2012. b. s/p prophylactic Medtronic ICD implantation 10/2012.  Marland Kitchen TIA (transient ischemic attack) 05/18/2014  . Tobacco abuse   . VENTRICULAR TACHYCARDIA  05/21/2010   Past Surgical History:  Procedure Laterality Date  . CARDIAC DEFIBRILLATOR PLACEMENT  10/2012  . CESAREAN SECTION  1977; 1982  . COLONOSCOPY    . CYSTOSCOPY W/ STONE MANIPULATION  1990's  . IMPLANTABLE CARDIOVERTER DEFIBRILLATOR IMPLANT N/A 10/21/2012   Procedure: IMPLANTABLE CARDIOVERTER DEFIBRILLATOR IMPLANT;  Surgeon: Evans Lance, MD;  Location: [facility] CATH LAB;  Service: Cardiovascular;  Laterality: N/A;  . RIGHT/LEFT HEART CATH AND CORONARY ANGIOGRAPHY N/A 03/26/2018   Procedure: RIGHT/LEFT HEART CATH AND CORONARY ANGIOGRAPHY;  Surgeon: Nelva Bush, MD;  Location:  [place] CV LAB;  Service: Cardiovascular;  Laterality: N/A;   Social History   Social History Narrative   Single - disabled - last job at [employer]   + alcohol no smoking no substances   family history includes Asthma in her brother; Cancer in her mother; Heart attack in her father; Other in her brother.   Review of Systems As above some left ankle edema O/w all negative Objective:   Physical Exam @BP  (!) 100/58   Pulse (!) 59   Ht 5' 9.5" (1.765 m)   Wt 172 lb (78 kg)   BMI 25.04 kg/m @  General:  Well-developed, well-nourished and in no acute distress Eyes:  anicteric. ENT:   Mouth and posterior pharynx free of lesions.  Neck:   supple w/o thyromegaly or mass.  Lungs: Clear to auscultation bilaterally. Heart:  S1S2, no rubs, murmurs, gallops. distanty heart sounds Abdomen:  soft, non-tender, no hepatosplenomegaly, hernia, or mass and BS+.  Lymph:  no cervical or supraclavicular adenopathy. Extremities:   no edema, cyanosis or clubbing Skin   no rash. Neuro:  A&O x 3.  Psych:  appropriate mood and  Affect.   Data Reviewed: See HPI I have reviewed labs in [facility] notes Cardiology and pulmonary notes Echo and cardiac cath

## 2018-07-22 ENCOUNTER — Encounter: Payer: Self-pay | Admitting: Cardiovascular Disease

## 2018-07-22 ENCOUNTER — Ambulatory Visit (INDEPENDENT_AMBULATORY_CARE_PROVIDER_SITE_OTHER): Payer: Medicare Other | Admitting: Cardiovascular Disease

## 2018-07-22 VITALS — BP 122/62 | HR 69 | Ht 69.5 in | Wt 176.5 lb

## 2018-07-22 DIAGNOSIS — I428 Other cardiomyopathies: Secondary | ICD-10-CM

## 2018-07-22 NOTE — Patient Instructions (Addendum)
Medication Instructions:   If you need a refill on your cardiac medications before your next appointment, please call your pharmacy.   Lab work:  If you have labs (blood work) drawn today and your tests are completely normal, you will receive your results only by: . MyChart Message (if you have MyChart) OR . A paper copy in the mail If you have any lab test that is abnormal or we need to change your treatment, we will call you to review the results.  Testing/Procedures: Your physician has requested that you have an echocardiogram in 6 months with office visit. Echocardiography is a painless test that uses sound waves to create images of your heart. It provides your doctor with information about the size and shape of your heart and how well your heart's chambers and valves are working. This procedure takes approximately one hour. There are no restrictions for this procedure.  Follow-Up: At [facility], you and your health needs are our priority.  As part of our continuing mission to provide you with exceptional heart care, we have created designated Provider Care Teams.  These Care Teams include your primary Cardiologist (physician) and Advanced Practice Providers (APPs -  Physician Assistants and Nurse Practitioners) who all work together to provide you with the care you need, when you need it. You will need a follow up appointment in 6 months.  Please call our office 2 months in advance to schedule this appointment.  You may see Peter Nishan, MD or one of the following Advanced Practice Providers on your designated Care Team:   Lori Gerhardt, NP Laura Ingold, NP . Jill McDaniel, NP    

## 2018-07-23 ENCOUNTER — Other Ambulatory Visit: Payer: Self-pay | Admitting: Internal Medicine

## 2018-07-23 DIAGNOSIS — R1310 Dysphagia, unspecified: Secondary | ICD-10-CM

## 2018-07-23 DIAGNOSIS — R933 Abnormal findings on diagnostic imaging of other parts of digestive tract: Secondary | ICD-10-CM

## 2018-07-23 DIAGNOSIS — R1319 Other dysphagia: Secondary | ICD-10-CM

## 2018-08-03 ENCOUNTER — Encounter (HOSPITAL_COMMUNITY): Payer: Self-pay | Admitting: Emergency Medicine

## 2018-08-03 ENCOUNTER — Other Ambulatory Visit: Payer: Self-pay

## 2018-08-04 ENCOUNTER — Encounter (HOSPITAL_COMMUNITY)
Admission: RE | Disposition: A | Discharge status: Home / Self Care | Payer: Self-pay | Source: Ambulatory Visit | Attending: Internal Medicine

## 2018-08-04 ENCOUNTER — Ambulatory Visit (HOSPITAL_COMMUNITY): Payer: Medicare Other | Admitting: Anesthesiology

## 2018-08-04 ENCOUNTER — Ambulatory Visit (HOSPITAL_COMMUNITY)
Admission: RE | Admit: 2018-08-04 | Discharge: 2018-08-04 | Disposition: A | Discharge status: Home / Self Care | Payer: Medicare Other | Source: Ambulatory Visit | Attending: Internal Medicine | Admitting: Internal Medicine

## 2018-08-04 ENCOUNTER — Encounter (HOSPITAL_COMMUNITY): Payer: Self-pay | Admitting: *Deleted

## 2018-08-04 DIAGNOSIS — R1319 Other dysphagia: Secondary | ICD-10-CM

## 2018-08-04 DIAGNOSIS — Z87891 Personal history of nicotine dependence: Secondary | ICD-10-CM | POA: Diagnosis not present

## 2018-08-04 DIAGNOSIS — I739 Peripheral vascular disease, unspecified: Secondary | ICD-10-CM | POA: Insufficient documentation

## 2018-08-04 DIAGNOSIS — I110 Hypertensive heart disease with heart failure: Secondary | ICD-10-CM | POA: Insufficient documentation

## 2018-08-04 DIAGNOSIS — R1313 Dysphagia, pharyngeal phase: Secondary | ICD-10-CM | POA: Diagnosis present

## 2018-08-04 DIAGNOSIS — I2510 Atherosclerotic heart disease of native coronary artery without angina pectoris: Secondary | ICD-10-CM | POA: Diagnosis not present

## 2018-08-04 DIAGNOSIS — Z8673 Personal history of transient ischemic attack (TIA), and cerebral infarction without residual deficits: Secondary | ICD-10-CM | POA: Insufficient documentation

## 2018-08-04 DIAGNOSIS — R1310 Dysphagia, unspecified: Secondary | ICD-10-CM | POA: Diagnosis not present

## 2018-08-04 DIAGNOSIS — Z95810 Presence of automatic (implantable) cardiac defibrillator: Secondary | ICD-10-CM | POA: Insufficient documentation

## 2018-08-04 DIAGNOSIS — R933 Abnormal findings on diagnostic imaging of other parts of digestive tract: Secondary | ICD-10-CM

## 2018-08-04 DIAGNOSIS — J449 Chronic obstructive pulmonary disease, unspecified: Secondary | ICD-10-CM | POA: Diagnosis not present

## 2018-08-04 DIAGNOSIS — Z7982 Long term (current) use of aspirin: Secondary | ICD-10-CM | POA: Insufficient documentation

## 2018-08-04 DIAGNOSIS — R948 Abnormal results of function studies of other organs and systems: Secondary | ICD-10-CM | POA: Diagnosis present

## 2018-08-04 DIAGNOSIS — Z79899 Other long term (current) drug therapy: Secondary | ICD-10-CM | POA: Insufficient documentation

## 2018-08-04 DIAGNOSIS — Z8249 Family history of ischemic heart disease and other diseases of the circulatory system: Secondary | ICD-10-CM | POA: Diagnosis not present

## 2018-08-04 DIAGNOSIS — I5022 Chronic systolic (congestive) heart failure: Secondary | ICD-10-CM | POA: Diagnosis not present

## 2018-08-04 DIAGNOSIS — I252 Old myocardial infarction: Secondary | ICD-10-CM | POA: Diagnosis not present

## 2018-08-04 HISTORY — PX: ESOPHAGOGASTRODUODENOSCOPY (EGD) WITH PROPOFOL: SHX5813

## 2018-08-04 SURGERY — ESOPHAGOGASTRODUODENOSCOPY (EGD) WITH PROPOFOL
Anesthesia: Monitor Anesthesia Care

## 2018-08-04 MED ORDER — PROPOFOL 500 MG/50ML IV EMUL
INTRAVENOUS | Status: DC | PRN
  Administered 2018-08-04: 150 ug/kg/min via INTRAVENOUS

## 2018-08-04 MED ORDER — LACTATED RINGERS IV SOLN
INTRAVENOUS | Status: DC
  Administered 2018-08-04: 1000 mL via INTRAVENOUS

## 2018-08-04 MED ORDER — PROPOFOL 10 MG/ML IV BOLUS
INTRAVENOUS | Status: AC
  Filled 2018-08-04: qty 40

## 2018-08-04 MED ORDER — SODIUM CHLORIDE 0.9 % IV SOLN: INTRAVENOUS | Status: DC

## 2018-08-04 MED ORDER — PROPOFOL 10 MG/ML IV BOLUS
INTRAVENOUS | Status: DC | PRN
  Administered 2018-08-04: 20 mg via INTRAVENOUS

## 2018-08-04 MED ORDER — LIDOCAINE 2% (20 MG/ML) 5 ML SYRINGE
INTRAMUSCULAR | Status: DC | PRN
  Administered 2018-08-04: 100 mg via INTRAVENOUS

## 2018-08-04 MED ORDER — LACTATED RINGERS IV SOLN: INTRAVENOUS | Status: DC

## 2018-08-04 MED ORDER — ONDANSETRON HCL 4 MG/2ML IJ SOLN
INTRAMUSCULAR | Status: DC | PRN
  Administered 2018-08-04: 4 mg via INTRAVENOUS

## 2018-08-04 SURGICAL SUPPLY — 14 items

## 2018-08-04 NOTE — Transfer of Care (Signed)
Immediate Anesthesia Transfer of Care Note  Patient: [name]  Procedure(s) Performed: ESOPHAGOGASTRODUODENOSCOPY (EGD) WITH PROPOFOL (N/A )  Patient Location: PACU  Anesthesia Type:MAC  Level of Consciousness: awake, alert  and oriented  Airway & Oxygen Therapy: Patient Spontanous Breathing and Patient connected to face mask oxygen  Post-op Assessment: Report given to RN and Post -op Vital signs reviewed and stable  Post vital signs: Reviewed and stable  Last Vitals:  Vitals Value Taken Time  BP    Temp    Pulse 72 08/04/2018 10:29 AM  Resp 25 08/04/2018 10:29 AM  SpO2 98 % 08/04/2018 10:29 AM  Vitals shown include unvalidated device data.  Last Pain:  Vitals:   08/04/18 0900  TempSrc: Oral  PainSc: 0-No pain         Complications: No apparent anesthesia complications

## 2018-08-04 NOTE — Anesthesia Preprocedure Evaluation (Addendum)
Anesthesia Evaluation  Patient identified by MRN, date of birth, ID band Patient awake    Reviewed: Allergy & Precautions, NPO status , Patient's Chart, lab work & pertinent test results, reviewed documented beta blocker date and time   Airway Mallampati: III  TM Distance: >3 FB Neck ROM: Full    Dental  (+) Edentulous Upper, Missing, Poor Dentition, Dental Advisory Given   Pulmonary COPD,  COPD inhaler, former smoker,    breath sounds clear to auscultation       Cardiovascular hypertension, Pt. on home beta blockers and Pt. on medications + CAD and + Peripheral Vascular Disease   Rhythm:Regular Rate:Normal     Neuro/Psych PSYCHIATRIC DISORDERS TIA   GI/Hepatic negative GI ROS, Neg liver ROS,   Endo/Other  negative endocrine ROS  Renal/GU negative Renal ROS     Musculoskeletal negative musculoskeletal ROS (+)   Abdominal Normal abdominal exam  (+)   Peds  Hematology negative hematology ROS (+)   Anesthesia Other Findings - HLD  Reproductive/Obstetrics negative OB ROS                            EKG: sinus tachycardia.  Echo: - Left ventricle: The cavity size was mildly dilated. Wall   thickness was normal. Systolic function was severely reduced. The   estimated ejection fraction was in the range of 25% to 30%.   Diffuse hypokinesis. Doppler parameters are consistent with   abnormal left ventricular relaxation (grade 1 diastolic   dysfunction). - Aortic valve: There was mild regurgitation. - Mitral valve: There was mild regurgitation. - Left atrium: The atrium was mildly dilated.   Anesthesia Physical Anesthesia Plan  ASA: IV  Anesthesia Plan: MAC   Post-op Pain Management:    Induction: Intravenous  PONV Risk Score and Plan: Propofol infusion and Ondansetron  Airway Management Planned: Natural Airway and Nasal Cannula  Additional Equipment: None  Intra-op Plan:    Post-operative Plan:   Informed Consent: I have reviewed the patients History and Physical, chart, labs and discussed the procedure including the risks, benefits and alternatives for the proposed anesthesia with the patient or authorized representative who has indicated his/her understanding and acceptance.     Plan Discussed with: CRNA  Anesthesia Plan Comments:        Anesthesia Quick Evaluation

## 2018-08-04 NOTE — Op Note (Signed)
[facility] Patient Name: [name] Procedure Date: 08/04/2018 MRN: [id number] Attending MD: Gatha Mayer , MD Date of Birth: 02/23/1956 CSN: [id number] Age: 62 Admit Type: Outpatient Procedure:                Upper GI endoscopy Indications:              Abnormal CT of the GI tract, thickened esophagus on                            CT, some rare solid dysphagia (apples) Providers:                Gatha Mayer, MD, Vista Lawman, RN, Tinnie Gens,                            Technician, Virgia Land, CRNA Referring MD:              Medicines:                 Complications:            No immediate complications. Estimated Blood Loss:     Estimated blood loss: none. Procedure:                Pre-Anesthesia Assessment:                           - Prior to the procedure, a History and Physical                            was performed, and patient medications and                            allergies were reviewed. The patient's tolerance of                            previous anesthesia was also reviewed. The risks                            and benefits of the procedure and the sedation                            options and risks were discussed with the patient.                            All questions were answered, and informed consent                            was obtained. Prior Anticoagulants: The patient has                            taken no previous anticoagulant or antiplatelet                            agents. ASA Grade Assessment: IV - A patient with  severe systemic disease that is a constant threat                            to life. After reviewing the risks and benefits,                            the patient was deemed in satisfactory condition to                            undergo the procedure.                           After obtaining informed consent, the endoscope was                            passed under direct  vision. Throughout the                            procedure, the patient's blood pressure, pulse, and                            oxygen saturations were monitored continuously. The                            GIF-H190 ([id number]) Olympus adult endoscope was                            introduced through the mouth, and advanced to the                            second part of duodenum. The upper GI endoscopy was                            accomplished without difficulty. The patient                            tolerated the procedure well. Scope In: Scope Out: Findings:      The esophagus was normal.      The stomach was normal.      The examined duodenum was normal.      The cardia and gastric fundus were normal on retroflexion. Impression:               - Normal esophagus.                           - Normal stomach.                           - Normal examined duodenum.                           - No specimens collected.                           I suspect solid dysphagia (rare - especially with  apples) is related to dentition problems. I suspect                            the CT saw physiologic contraction that caused                            thickened esophageal wall. Moderate Sedation:      Not Applicable - Patient had care per Anesthesia. Recommendation:           - Patient has a contact number available for                            emergencies. The signs and symptoms of potential                            delayed complications were discussed with the                            patient. Return to normal activities tomorrow.                            Written discharge instructions were provided to the                            patient.                           - Resume previous diet.                           - Continue present medications.                           - NEEDS MODIFIED BARIUM SWALLOW - my office will                            call and  schedule this                           reason - pharyngeal dysphagia                           ? aspiration - will cough after drinking liquids at                            times                           She is improving overall re: health so will have                            office RN place COLONOSCOPY RECALL 09/2018 and                            consider a colonioscopy next year - she is open to  it. Procedure Code(s):        --- Professional ---                           [phone], Esophagogastroduodenoscopy, flexible,                            transoral; diagnostic, including collection of                            specimen(s) by brushing or washing, when performed                            (separate procedure) Diagnosis Code(s):        --- Professional ---                           R93.3, Abnormal findings on diagnostic imaging of                            other parts of digestive tract CPT copyright 2018 [employer]. All rights reserved. The codes documented in this report are preliminary and upon coder review may  be revised to meet current compliance requirements. Gatha Mayer, MD 08/04/2018 10:44:25 AM This report has been signed electronically. Number of Addenda: 0

## 2018-08-04 NOTE — Interval H&P Note (Signed)
History and Physical Interval Note:  08/04/2018 10:09 AM  [name]  has presented today for surgery, with the diagnosis of dysphagia, abnormal CT of esophagus  The various methods of treatment have been discussed with the patient and family. After consideration of risks, benefits and other options for treatment, the patient has consented to  Procedure(s): ESOPHAGOGASTRODUODENOSCOPY (EGD) WITH PROPOFOL (N/A) as a surgical intervention .  The patient's history has been reviewed, patient examined, no change in status, stable for surgery.  I have reviewed the patient's chart and labs.  Questions were answered to the patient's satisfaction.     Silvano Rusk

## 2018-08-04 NOTE — Anesthesia Postprocedure Evaluation (Signed)
Anesthesia Post Note  Patient: [name]  Procedure(s) Performed: ESOPHAGOGASTRODUODENOSCOPY (EGD) WITH PROPOFOL (N/A )     Patient location during evaluation: PACU Anesthesia Type: MAC Level of consciousness: awake and alert Pain management: pain level controlled Vital Signs Assessment: post-procedure vital signs reviewed and stable Respiratory status: spontaneous breathing, nonlabored ventilation, respiratory function stable and patient connected to nasal cannula oxygen Cardiovascular status: stable and blood pressure returned to baseline Postop Assessment: no apparent nausea or vomiting Anesthetic complications: no    Last Vitals:  Vitals:   08/04/18 1040 08/04/18 1050  BP: 132/74 (!) 144/68  Pulse: (!) 57   Resp: 18   Temp:    SpO2: 100%     Last Pain:  Vitals:   08/04/18 1050  TempSrc:   PainSc: 0-No pain                 Effie Berkshire

## 2018-08-04 NOTE — Discharge Instructions (Signed)
° °  I did not find any problems. I suspect the CT pictures were taken when the esophagus was contracting - which would make the wall thick.  You should eat slowly and chew well, cut food into small pieces.  Since you say you think things go down the wrong pipe at times we will order an additional evaluation called a modified barium swallow test. My office should contact you about this.  I appreciate the opportunity to care for you. Gatha Mayer, MD, FACG   YOU HAD AN ENDOSCOPIC PROCEDURE TODAY: Refer to the procedure report and other information in the discharge instructions given to you for any specific questions about what was found during the examination. If this information does not answer your questions, please call Dr. Celesta Aver office at [phone] to clarify.   YOU SHOULD EXPECT: Some feelings of bloating in the abdomen. Passage of more gas than usual. Walking can help get rid of the air that was put into your GI tract during the procedure and reduce the bloating. If you had a lower endoscopy (such as a colonoscopy or flexible sigmoidoscopy) you may notice spotting of blood in your stool or on the toilet paper. Some abdominal soreness may be present for a day or two, also.  DIET: Your first meal following the procedure should be a light meal and then it is ok to progress to your normal diet. A half-sandwich or bowl of soup is an example of a good first meal. Heavy or fried foods are harder to digest and may make you feel nauseous or bloated. Drink plenty of fluids but you should avoid alcoholic beverages for 24 hours.   ACTIVITY: Your care partner should take you home directly after the procedure. You should plan to take it easy, moving slowly for the rest of the day. You can resume normal activity the day after the procedure however YOU SHOULD NOT DRIVE, use power tools, machinery or perform tasks that involve climbing or major physical exertion for 24 hours (because of the  sedation medicines used during the test).   SYMPTOMS TO REPORT IMMEDIATELY: A gastroenterologist can be reached at any hour. Please call [phone]  for any of the following symptoms:   Following upper endoscopy (EGD, EUS, ERCP, esophageal dilation) Vomiting of blood or coffee ground material  New, significant abdominal pain  New, significant chest pain or pain under the shoulder blades  Painful or persistently difficult swallowing  New shortness of breath  Black, tarry-looking or red, bloody stools  FOLLOW UP:  If any biopsies were taken you will be contacted by phone or by letter within the next 1-3 weeks. Call [phone]  if you have not heard about the biopsies in 3 weeks.  Please also call with any specific questions about appointments or follow up tests.

## 2018-08-05 ENCOUNTER — Encounter (HOSPITAL_COMMUNITY): Payer: Self-pay | Admitting: Internal Medicine

## 2018-09-15 ENCOUNTER — Ambulatory Visit (INDEPENDENT_AMBULATORY_CARE_PROVIDER_SITE_OTHER): Payer: Medicare Other

## 2018-09-15 DIAGNOSIS — I428 Other cardiomyopathies: Secondary | ICD-10-CM | POA: Diagnosis not present

## 2018-09-15 DIAGNOSIS — I5022 Chronic systolic (congestive) heart failure: Secondary | ICD-10-CM

## 2018-09-16 LAB — CUP PACEART REMOTE DEVICE CHECK
Battery Remaining Longevity: 78 mo
Battery Voltage: 2.99 V
Brady Statistic RV Percent Paced: 0.14 %
HIGH POWER IMPEDANCE MEASURED VALUE: 76 Ohm
Implantable Lead Implant Date: 20140212
Implantable Lead Location: 753860
Implantable Pulse Generator Implant Date: 20140212
Lead Channel Impedance Value: 437 Ohm
Lead Channel Impedance Value: 494 Ohm
Lead Channel Pacing Threshold Amplitude: 0.5 V
Lead Channel Sensing Intrinsic Amplitude: 8.75 mV
Lead Channel Setting Pacing Amplitude: 2.5 V
Lead Channel Setting Pacing Pulse Width: 0.4 ms
Lead Channel Setting Sensing Sensitivity: 0.3 mV
MDC IDC MSMT LEADCHNL RV PACING THRESHOLD PULSEWIDTH: 0.4 ms
MDC IDC MSMT LEADCHNL RV SENSING INTR AMPL: 8.75 mV
MDC IDC SESS DTM: 20200108173207

## 2018-09-17 NOTE — Progress Notes (Signed)
Remote ICD transmission.   

## 2018-11-09 ENCOUNTER — Other Ambulatory Visit: Payer: Self-pay | Admitting: Cardiovascular Disease

## 2018-11-09 DIAGNOSIS — I739 Peripheral vascular disease, unspecified: Secondary | ICD-10-CM

## 2018-11-23 ENCOUNTER — Ambulatory Visit (HOSPITAL_COMMUNITY)
Admission: RE | Admit: 2018-11-23 | Discharge: 2018-11-23 | Disposition: A | Discharge status: Home / Self Care | Payer: Medicare Other | Source: Ambulatory Visit | Attending: Cardiovascular Disease | Admitting: Cardiovascular Disease

## 2018-11-23 ENCOUNTER — Other Ambulatory Visit: Payer: Self-pay

## 2018-11-23 DIAGNOSIS — I739 Peripheral vascular disease, unspecified: Secondary | ICD-10-CM | POA: Insufficient documentation

## 2018-12-15 ENCOUNTER — Ambulatory Visit (INDEPENDENT_AMBULATORY_CARE_PROVIDER_SITE_OTHER): Payer: Medicare Other | Admitting: *Deleted

## 2018-12-15 ENCOUNTER — Other Ambulatory Visit: Payer: Self-pay

## 2018-12-15 DIAGNOSIS — I428 Other cardiomyopathies: Secondary | ICD-10-CM | POA: Diagnosis not present

## 2018-12-15 DIAGNOSIS — I5022 Chronic systolic (congestive) heart failure: Secondary | ICD-10-CM

## 2018-12-15 LAB — CUP PACEART REMOTE DEVICE CHECK
Battery Remaining Longevity: 73 mo
Battery Voltage: 2.99 V
Brady Statistic RV Percent Paced: 0.18 %
Date Time Interrogation Session: 20200407190543
HighPow Impedance: 82 Ohm
Implantable Lead Implant Date: 20140212
Implantable Lead Location: 753860
Implantable Lead Model: 6935
Implantable Pulse Generator Implant Date: 20140212
Lead Channel Impedance Value: 494 Ohm
Lead Channel Impedance Value: 589 Ohm
Lead Channel Pacing Threshold Amplitude: 0.5 V
Lead Channel Pacing Threshold Pulse Width: 0.4 ms
Lead Channel Sensing Intrinsic Amplitude: 10.125 mV
Lead Channel Sensing Intrinsic Amplitude: 10.125 mV
Lead Channel Setting Pacing Amplitude: 2.5 V
Lead Channel Setting Pacing Pulse Width: 0.4 ms
Lead Channel Setting Sensing Sensitivity: 0.3 mV

## 2018-12-24 NOTE — Progress Notes (Signed)
Remote ICD transmission.   

## 2019-01-03 ENCOUNTER — Emergency Department (HOSPITAL_COMMUNITY): Payer: Medicare Other

## 2019-01-03 ENCOUNTER — Emergency Department (HOSPITAL_COMMUNITY)
Admission: EM | Admit: 2019-01-03 | Discharge: 2019-01-03 | Disposition: A | Discharge status: Home / Self Care | Payer: Medicare Other | Attending: Emergency Medicine | Admitting: Emergency Medicine

## 2019-01-03 ENCOUNTER — Other Ambulatory Visit: Payer: Self-pay

## 2019-01-03 DIAGNOSIS — S80211A Abrasion, right knee, initial encounter: Secondary | ICD-10-CM | POA: Diagnosis not present

## 2019-01-03 DIAGNOSIS — Y92008 Other place in unspecified non-institutional (private) residence as the place of occurrence of the external cause: Secondary | ICD-10-CM | POA: Insufficient documentation

## 2019-01-03 DIAGNOSIS — Z79899 Other long term (current) drug therapy: Secondary | ICD-10-CM | POA: Diagnosis not present

## 2019-01-03 DIAGNOSIS — S8992XA Unspecified injury of left lower leg, initial encounter: Secondary | ICD-10-CM | POA: Diagnosis present

## 2019-01-03 DIAGNOSIS — N3000 Acute cystitis without hematuria: Secondary | ICD-10-CM | POA: Diagnosis not present

## 2019-01-03 DIAGNOSIS — J449 Chronic obstructive pulmonary disease, unspecified: Secondary | ICD-10-CM | POA: Diagnosis not present

## 2019-01-03 DIAGNOSIS — Z23 Encounter for immunization: Secondary | ICD-10-CM | POA: Insufficient documentation

## 2019-01-03 DIAGNOSIS — Z7982 Long term (current) use of aspirin: Secondary | ICD-10-CM | POA: Insufficient documentation

## 2019-01-03 DIAGNOSIS — Y9389 Activity, other specified: Secondary | ICD-10-CM | POA: Insufficient documentation

## 2019-01-03 DIAGNOSIS — Z95810 Presence of automatic (implantable) cardiac defibrillator: Secondary | ICD-10-CM | POA: Diagnosis not present

## 2019-01-03 DIAGNOSIS — I110 Hypertensive heart disease with heart failure: Secondary | ICD-10-CM | POA: Insufficient documentation

## 2019-01-03 DIAGNOSIS — W01198A Fall on same level from slipping, tripping and stumbling with subsequent striking against other object, initial encounter: Secondary | ICD-10-CM | POA: Diagnosis not present

## 2019-01-03 DIAGNOSIS — S80212A Abrasion, left knee, initial encounter: Secondary | ICD-10-CM | POA: Insufficient documentation

## 2019-01-03 DIAGNOSIS — Y998 Other external cause status: Secondary | ICD-10-CM | POA: Insufficient documentation

## 2019-01-03 DIAGNOSIS — I2510 Atherosclerotic heart disease of native coronary artery without angina pectoris: Secondary | ICD-10-CM | POA: Diagnosis not present

## 2019-01-03 DIAGNOSIS — R42 Dizziness and giddiness: Secondary | ICD-10-CM | POA: Diagnosis not present

## 2019-01-03 DIAGNOSIS — I5020 Unspecified systolic (congestive) heart failure: Secondary | ICD-10-CM | POA: Insufficient documentation

## 2019-01-03 DIAGNOSIS — Z87891 Personal history of nicotine dependence: Secondary | ICD-10-CM | POA: Insufficient documentation

## 2019-01-03 DIAGNOSIS — Z8673 Personal history of transient ischemic attack (TIA), and cerebral infarction without residual deficits: Secondary | ICD-10-CM | POA: Diagnosis not present

## 2019-01-03 DIAGNOSIS — W19XXXA Unspecified fall, initial encounter: Secondary | ICD-10-CM

## 2019-01-03 LAB — TROPONIN I
Troponin I: <0.03 ng/mL (ref <0.03)
Troponin I: <0.03 ng/mL (ref <0.03)

## 2019-01-03 LAB — CBC
HCT: 45.7 % (ref 36.0–46.0)
Hemoglobin: 14.9 g/dL (ref 12.0–15.0)
MCH: 31.1 pg (ref 26.0–34.0)
MCHC: 32.6 g/dL (ref 30.0–36.0)
MCV: 95.4 fL (ref 80.0–100.0)
Platelets: UNDETERMINED 10*3/uL (ref 150–400)
RBC: 4.79 MIL/uL (ref 3.87–5.11)
RDW: 13.5 % (ref 11.5–15.5)
WBC: 6.8 10*3/uL (ref 4.0–10.5)
nRBC: 0 % (ref 0.0–0.2)

## 2019-01-03 LAB — URINALYSIS, ROUTINE W REFLEX MICROSCOPIC
Bilirubin Urine: NEGATIVE
Glucose, UA: NEGATIVE mg/dL
Hgb urine dipstick: NEGATIVE
Ketones, ur: NEGATIVE mg/dL
Nitrite: NEGATIVE
Protein, ur: NEGATIVE mg/dL
Specific Gravity, Urine: 1.006 (ref 1.005–1.030)
pH: 6 (ref 5.0–8.0)

## 2019-01-03 LAB — BASIC METABOLIC PANEL
Anion gap: 13 (ref 5–15)
BUN: 8 mg/dL (ref 8–23)
CO2: 21 mmol/L — ABNORMAL LOW (ref 22–32)
Calcium: 8.9 mg/dL (ref 8.9–10.3)
Chloride: 105 mmol/L (ref 98–111)
Creatinine, Ser: 0.84 mg/dL (ref 0.44–1.00)
GFR calc Af Amer: >60 mL/min (ref >60)
GFR calc non Af Amer: >60 mL/min (ref >60)
Glucose, Bld: 103 mg/dL — ABNORMAL HIGH (ref 70–99)
Potassium: 4 mmol/L (ref 3.5–5.1)
Sodium: 139 mmol/L (ref 135–145)

## 2019-01-03 MED ORDER — TETANUS-DIPHTH-ACELL PERTUSSIS 5-2.5-18.5 LF-MCG/0.5 IM SUSP
0.5000 mL | Freq: Once | INTRAMUSCULAR | Status: AC
  Administered 2019-01-03: 0.5 mL via INTRAMUSCULAR
  Filled 2019-01-03: qty 0.5

## 2019-01-03 MED ORDER — SODIUM CHLORIDE 0.9% FLUSH
3.0000 mL | Freq: Once | INTRAVENOUS | Status: AC
  Administered 2019-01-03: 3 mL via INTRAVENOUS

## 2019-01-03 MED ORDER — SODIUM CHLORIDE 0.9 % IV BOLUS
500.0000 mL | Freq: Once | INTRAVENOUS | Status: AC
  Administered 2019-01-03: 500 mL via INTRAVENOUS

## 2019-01-03 MED ORDER — CEPHALEXIN 500 MG PO CAPS: 500.0000 mg | ORAL_CAPSULE | Freq: Two times a day (BID) | ORAL | 0 refills | Status: AC

## 2019-01-03 MED ORDER — BACITRACIN ZINC 500 UNIT/GM EX OINT
TOPICAL_OINTMENT | Freq: Once | CUTANEOUS | Status: AC
  Administered 2019-01-03: 1 via TOPICAL
  Filled 2019-01-03: qty 0.9

## 2019-01-03 MED ORDER — HYDROCODONE-ACETAMINOPHEN 5-325 MG PO TABS
1.0000 | ORAL_TABLET | Freq: Once | ORAL | Status: AC
  Administered 2019-01-03: 1 via ORAL
  Filled 2019-01-03: qty 1

## 2019-01-03 NOTE — Discharge Instructions (Addendum)
Your evaluated today after fall.  Your x-rays did not show any evidence of fracture.  We did put your left knee in a knee sleeve.  I would suggest Tylenol, ibuprofen as well as ice and elevating the extremity.  If you continue to have pain beyond 1 week I would recommend following up with orthopedics.  I referred you to Dr. Percell Miller.  His contact information is listed in your discharge paperwork.  You will need to call him to schedule an appointment.  Your urinalysis did show possible both urinary tract infection.  We will culture this.  We have discharged you with antibiotics.  If your urine culture is negative they will call you in 24 to 48 hours until you to stop taking the antibiotics.    If you develop worsening dizziness please follow-up with your PCP or return to the emergency department for evaluation.

## 2019-01-03 NOTE — ED Provider Notes (Signed)
[facility] EMERGENCY DEPARTMENT Provider Note   CSN: [id number] Arrival date & time: 01/03/19  1622  History   Chief Complaint Dizziness  HPI [name] is a 63 y.o. female with past medical history significant for Systolic HF, ICD, CAD, HTN, HLD, V Tach, COPD presents for evaluation after fall.  Patient states she was sitting on her front porch and went to stand up and got dizzy.  Patient states she leaned forward and went to grab for the railing and missed the railing causing her to fall onto her bilateral knees.  She denies hitting her head or LOC.  She states she was able to stand after falling, however she has pain to her bilateral knees.  States she is no longer dizzy. Dizziness lasted less than 1 minute.  She denies preceding headache, nausea, vomiting, chest pain, shortness of breath or abdominal pain.  She rates her knee pain a 9/10.  Pain does not radiate.  Pain located to anterior surface of bilateral knees.  Describes her pain as an aching sensation.  States she did not have a syncopal event.,  Denies fever, chills, nausea, vomiting, headache, neck pain, neck stiffness, difficulty speaking, unilateral weakness, facial asymmetry chest pain, shortness of breath, cough, abdominal pain, diarrhea, dysuria, bowel or bladder incontinence, saddle paresthesia, decreased range of motion, numbness or tingling in her extremities.  Denies previous history of syncopal events.  Patient remembers the entire incident.  Has not taken anything for symptoms PTA.  Denies additional aggravating or alleviating factors. Patient denies firing of her AICD unit. Describes her dizziness as the "room spinning." No loss of vision, tinnitus, sudden onset thunderclap HA.  Patient admits to abrasions to bilateral knees and left dorsal aspect of foot.  History obtained from patient.  No interpreter was used.     HPI  Past Medical History:  Diagnosis Date  . Acute on chronic systolic heart  failure ([place])   . Acute respiratory failure ([place])   . Automatic implantable cardioverter-defibrillator in situ   . CAD (coronary artery disease)    a. Inf-post MI 2008 s/p BMS to large marginal of Cx.   Marland Kitchen COPD (chronic obstructive pulmonary disease) ([place])   . ETOH abuse   . Hyperglycemia   . HYPERLIPIDEMIA-MIXED 02/07/2009  . HYPERTENSION, BENIGN 08/22/2009  . PVD (peripheral vascular disease) ([place])    a. Evaluated by Dr. [name] Anon 08/2012.  Marland Kitchen SYSTOLIC HEART FAILURE, CHRONIC 02/07/2009   a. EF 30% 2010, 29% by MRI 08/2012. b. s/p prophylactic Medtronic ICD implantation 10/2012.  Marland Kitchen TIA (transient ischemic attack) 05/18/2014  . Tobacco abuse   . VENTRICULAR TACHYCARDIA 05/21/2010    Patient Active Problem List   Diagnosis Date Noted  . Abnormal CT scan, esophagus   . CAD (coronary artery disease)   . Acute respiratory failure ([place])   . Acute on chronic systolic heart failure ([place])   . Hyperglycemia   . COPD exacerbation ([place])   . CAP (community acquired pneumonia) 05/17/2016  . Essential hypertension   . COPD with acute exacerbation ([place]) 12/02/2014  . Systolic heart failure ([place]) 12/02/2014  . Dyspnea 12/02/2014  . Alcohol use ([place]) 12/02/2014  . Elevated hemoglobin ([place]) 12/02/2014  . Acute bronchitis 12/02/2014  . Acute respiratory failure with hypoxia ([place])   . Malnutrition of moderate degree ([place]) 05/19/2014  . Right facial numbness 05/18/2014  . Weakness of right lower extremity 05/18/2014  . TIA (transient ischemic attack) 05/18/2014  . 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  .  Smoker 08/11/2012  . PVD (peripheral vascular disease) ([place]) 08/11/2012  . VENTRICULAR TACHYCARDIA 05/21/2010  . BEN HTN HEART DISEASE WITHOUT HEART FAIL 08/28/2009  . HYPERTENSION, BENIGN 08/22/2009  . CHEST PAIN-PRECORDIAL 07/18/2009  . CARDIOVASCULAR FUNCTION STUDY, ABNORMAL 07/18/2009  . Elevated lipids 02/07/2009  . CAD, NATIVE VESSEL 02/07/2009  . Nonischemic  cardiomyopathy ([place]) 02/07/2009  . SYSTOLIC HEART FAILURE, CHRONIC 02/07/2009    Past Surgical History:  Procedure Laterality Date  . CARDIAC DEFIBRILLATOR PLACEMENT  10/2012  . CESAREAN SECTION  1977; 1982  . COLONOSCOPY    . CYSTOSCOPY W/ STONE MANIPULATION  1990's  . ESOPHAGOGASTRODUODENOSCOPY (EGD) WITH PROPOFOL N/A 08/04/2018   Procedure: ESOPHAGOGASTRODUODENOSCOPY (EGD) WITH PROPOFOL;  Surgeon: Gatha Mayer, MD;  Location: [facility] ENDOSCOPY;  Service: Endoscopy;  Laterality: N/A;  . IMPLANTABLE CARDIOVERTER DEFIBRILLATOR IMPLANT N/A 10/21/2012   Procedure: IMPLANTABLE CARDIOVERTER DEFIBRILLATOR IMPLANT;  Surgeon: Evans Lance, MD;  Location: [facility] CATH LAB;  Service: Cardiovascular;  Laterality: N/A;  . RIGHT/LEFT HEART CATH AND CORONARY ANGIOGRAPHY N/A 03/26/2018   Procedure: RIGHT/LEFT HEART CATH AND CORONARY ANGIOGRAPHY;  Surgeon: Nelva Bush, MD;  Location: [facility] CV LAB;  Service: Cardiovascular;  Laterality: N/A;     OB History   No obstetric history on file.      Home Medications    Prior to Admission medications   Medication Sig Start Date End Date Taking? Authorizing Provider  albuterol (PROAIR HFA) 108 (90 Base) MCG/ACT inhaler Inhale 2 puffs into the lungs every 6 (six) hours as needed for wheezing or shortness of breath. 03/27/18 07/29/19 Yes Elodia Florence., MD  aspirin EC 81 MG tablet Take 81 mg by mouth daily.   Yes [provider]  carvedilol (COREG) 12.5 MG tablet Take 1 tablet (12.5 mg total) by mouth 2 (two) times daily with a meal. 05/04/18  Yes [name], MD  feeding supplement, ENSURE COMPLETE, (ENSURE COMPLETE) LIQD Take 237 mLs by mouth daily. 05/19/14  Yes Mikhail, [place], DO  folic acid (FOLVITE) 1 MG tablet Take 1 mg by mouth daily.  05/08/18  Yes [provider]  nitroGLYCERIN (NITROSTAT) 0.4 MG SL tablet Place 1 tablet (0.4 mg total) under the tongue every 5 (five) minutes as needed for chest pain (up to 3 doses).  11/03/17  Yes [name], MD  sacubitril-valsartan (ENTRESTO) 97-103 MG Take 1 tablet by mouth 2 (two) times daily. 05/08/18 05/03/19 Yes [name], MD  spironolactone (ALDACTONE) 25 MG tablet Take 0.5 tablets (12.5 mg total) by mouth daily. 03/28/18 07/29/19 Yes Elodia Florence., MD  cephALEXin (KEFLEX) 500 MG capsule Take 1 capsule (500 mg total) by mouth 2 (two) times daily for 5 days. 01/03/19 01/08/19  Ercell Razon A, PA-C  Tiotropium Bromide-Olodaterol (STIOLTO RESPIMAT) 2.5-2.5 MCG/ACT AERS Inhale 2 puffs into the lungs daily. Patient not taking: Reported on 01/03/2019 06/08/18   Brand Males, MD    Family History Family History  Problem Relation Age of Onset  . Cancer Mother        cervical  . Heart attack Father   . Asthma Brother   . Other Brother        Pacemaker    Social History Social History   Tobacco Use  . Smoking status: Former Smoker    Packs/day: 0.50    Years: 40.00    Pack years: 20.00    Types: Cigarettes    Last attempt to quit: 04/27/2018    Years since quitting: 0.6  .  Smokeless tobacco: Never Used  . Tobacco comment: pt is on nicotine patches   Substance Use Topics  . Alcohol use: Yes    Alcohol/week: 20.0 standard drinks    Types: 20 Cans of beer per week    Comment: 05/18/2014 "2, 40's qod"; 1 beer last week   . Drug use: No     Allergies   Clopidogrel bisulfate   Review of Systems Review of Systems  Constitutional: Negative.   HENT: Negative.   Respiratory: Negative.   Gastrointestinal: Negative.   Genitourinary: Negative.   Musculoskeletal: Positive for gait problem. Negative for back pain, neck pain and neck stiffness.       Bilateral knee pain. Has not stood to attempt to ambulate after fall  Skin: Negative.   Neurological: Positive for dizziness. Negative for tremors, seizures, syncope, facial asymmetry, speech difficulty, weakness, light-headedness, numbness and headaches.  All other systems reviewed and are  negative.    Physical Exam Updated Vital Signs BP (!) 146/91   Pulse 72   Temp 97.8 F (36.6 C) (Oral)   Resp (!) 22   Ht 5' 9.5" (1.765 m)   Wt 83.5 kg   SpO2 95%   BMI 26.78 kg/m   Physical Exam  Physical Exam  Constitutional: Pt is oriented to person, place, and time. Pt appears well-developed and well-nourished. No distress.  HENT:  Head: Normocephalic and atraumatic.  Mouth/Throat: Oropharynx is clear and moist.  Eyes: Conjunctivae and EOM are normal. Pupils are equal, round, and reactive to light. No scleral icterus.  No horizontal, vertical or rotational nystagmus  Neck: Normal range of motion. Neck supple.  Full active and passive ROM without pain No midline or paraspinal tenderness No nuchal rigidity or meningeal signs  Cardiovascular: Normal rate, regular rhythm and intact distal pulses.   Pulmonary/Chest: Effort normal and breath sounds normal. No respiratory distress. Pt has no wheezes. No rales.  Abdominal: Soft. Bowel sounds are normal. There is no tenderness. There is no rebound and no guarding.  No evidence of overlying skin changes to abdomen. Musculoskeletal: Normal range of motion.  4 extremities without difficulty.  No midline spinal tenderness or step-offs.  No shortening or rotation of bilateral legs.  She is able to flex and extend as well as plantarflex and dorsiflex, however with plain to left knee with flexion.  Will to straight leg raise bilateral lower extremities.  Varus, valgus stress as well as anterior drawer to bilateral knees.  2+ DP, PT pulses.  Nonbleeding or draining abrasions to bilateral anterior aspect of knees.  She does have tenderness to her lateral joint line to her left knee.  Extremity compartments are soft.  No tenderness to bilateral calves.  No swelling to bilateral calves.  No tenderness over bilateral hips.  Stable pelvis. Lymphadenopathy:    No cervical adenopathy.  Neurological: Pt. is alert and oriented to person, place, and  time. He has normal reflexes. No cranial nerve deficit.  Exhibits normal muscle tone. Coordination normal.  Mental Status:  Alert, oriented, thought content appropriate. Speech fluent without evidence of aphasia. Able to follow 2 step commands without difficulty.  Cranial Nerves:  II:  Peripheral visual fields grossly normal, pupils equal, round, reactive to light III,IV, VI: ptosis not present, extra-ocular motions intact bilaterally  V,VII: smile symmetric, facial light touch sensation equal VIII: hearing grossly normal bilaterally  IX,X: midline uvula rise  XI: bilateral shoulder shrug equal and strong XII: midline tongue extension  Motor:  5/5 in upper and  lower extremities bilaterally including strong and equal grip strength and dorsiflexion/plantar flexion Sensory: Pinprick and light touch normal in all extremities.  Deep Tendon Reflexes: 2+ and symmetric  Cerebellar: normal finger-to-nose with bilateral upper extremities. Heel to shin normal to right. Unable to perform hell to shin to left secondary to knee pain. Gait: normal gait and balance CV: distal pulses palpable throughout   Skin: Skin is warm and dry. No rash noted. Pt is not diaphoretic.  Psychiatric: Pt has a normal mood and affect. Behavior is normal. Judgment and thought content normal.  Nursing note and vitals reviewed.         ED Treatments / Results  Labs (all labs ordered are listed, but only abnormal results are displayed) Labs Reviewed  BASIC METABOLIC PANEL - Abnormal; Notable for the following components:      Result Value   CO2 21 (*)    Glucose, Bld 103 (*)    All other components within normal limits  URINALYSIS, ROUTINE W REFLEX MICROSCOPIC - Abnormal; Notable for the following components:   Leukocytes,Ua MODERATE (*)    Bacteria, UA RARE (*)    All other components within normal limits  URINE CULTURE  CBC  TROPONIN I  TROPONIN I    EKG EKG Interpretation  Date/Time:  Sunday January 03 2019 16:49:02 EDT Ventricular Rate:  72 PR Interval:    QRS Duration: 107 QT Interval:  421 QTC Calculation: 461 R Axis:   6 Text Interpretation:  Sinus rhythm Nonspecific T wave abnormality Confirmed by Lajean Saver [phone]) on 01/03/2019 5:03:30 PM   Radiology Dg Pelvis 1-2 Views  Result Date: 01/03/2019 CLINICAL DATA:  Fall today. EXAM: PELVIS - 1-2 VIEW COMPARISON:  Pelvic CT 09/27/2008. FINDINGS: The bones appear mildly demineralized. No evidence of acute fracture or dislocation. There are minimal degenerative changes of the hips and sacroiliac joints. No evidence of femoral head avascular necrosis. Aortoiliac atherosclerosis noted. IMPRESSION: No acute osseous findings. Electronically Signed   By: Richardean Sale M.D.   On: 01/03/2019 17:53   Dg Knee Complete 4 Views Left  Result Date: 01/03/2019 CLINICAL DATA:  Patient fell after standing and becoming dizzy today. Left knee pain, swelling and abrasions. EXAM: LEFT KNEE - COMPLETE 4+ VIEW COMPARISON:  None. FINDINGS: The bones appear mildly demineralized. No evidence of acute fracture or dislocation. There is minimal joint space narrowing in the medial and patellofemoral compartments. No joint effusion or focal soft tissue abnormality identified. There is mild femoral popliteal atherosclerosis. IMPRESSION: No acute osseous findings. Electronically Signed   By: Richardean Sale M.D.   On: 01/03/2019 17:51   Dg Knee Complete 4 Views Right  Result Date: 01/03/2019 CLINICAL DATA:  Patient fell upon standing today and becoming dizzy. Bilateral knee abrasions. EXAM: RIGHT KNEE - COMPLETE 4+ VIEW COMPARISON:  None. FINDINGS: The bones appear mildly demineralized. There is no evidence of acute fracture or dislocation. The joint spaces appear preserved. There is no joint effusion. Mild femoral popliteal atherosclerosis noted. IMPRESSION: No acute osseous findings. Electronically Signed   By: Richardean Sale M.D.   On: 01/03/2019 17:52     Procedures Procedures (including critical care time)  Medications Ordered in ED Medications  sodium chloride flush (NS) 0.9 % injection 3 mL (3 mLs Intravenous Given 01/03/19 1938)  HYDROcodone-acetaminophen (NORCO/VICODIN) 5-325 MG per tablet 1 tablet (1 tablet Oral Given 01/03/19 1755)  sodium chloride 0.9 % bolus 500 mL (0 mLs Intravenous Stopped 01/03/19 1937)  Tdap (BOOSTRIX) injection 0.5 mL (  0.5 mLs Intramuscular Given 01/03/19 1755)  bacitracin ointment (1 application Topical Given 01/03/19 1803)   Initial Impression / Assessment and Plan / ED Course  I have reviewed the triage vital signs and the nursing notes.  Pertinent labs & imaging results that were available during my care of the patient were reviewed by me and considered in my medical decision making (see chart for details).  63 year old female appears otherwise well presents for evaluation after dizzy episode causing fall.  She denies hitting head, LOC or syncopal episode.  She is afebrile, nonseptic, non-ill-appearing.  She denies preceding chest pain, shortness of breath, palpitations or sudden onset thunderclap headache.  Went to stand from chair and felt like the room was spinning when she went to reach forward she missed the wall causing her to fall onto her bilateral knees.  Denies anticoagulation use.  Normal musculoskeletal exam, however has pain to lateral joint line to the left knee.  She is able to straight leg raise bilaterally without difficulty.  Neurovascularly intact.  She has no focal neurologic deficits, however she is unable to perform heel-to-shin to left lower extremity secondary to knee pain.  She has negative finger-to-nose, Romberg.  Patient denies any current dizziness on arrival to the emergency department.  She is not any episodes of headache, emesis.  She does have an extensive history of systolic heart failure, EF 30% with AICD placement.  Heart cath approximately 8 months ago with 20-30% occlusion.  Given  extensive cardiac history will obtain EKG, troponin, basic labs, give soft fluid bolus as well as image bilateral knees and reevaluate.  History and exam is not consistent with central cause of vertigo.  Likely orthostatic.  Will reevaluate.    Patient able to ambulate in room without difficulty.  No episodes of dizziness in department.  Negative orthostatic vital signs.  Imaging and labs personally reviewed:  Delta Troponin negative CBC without leukocytosis, hemoglobin 82.9  Metabolic panel without electrolyte, renal or liver abnormality Urinalysis with moderate leuks and rare bacteria, will send for culture and DC with antibiotics home in meantime.  DG pelvis. Left knee and right knee without acute findings.  Patient's abrasions cleaned and bacitracin applied.  Tdap updated.  No lacerations to repair.  EKG without ischemic changes.  Patient fall likely orthostatic related.  She has been able to ambulate and is tolerating p.o. intake without difficulty.  She has not any episodes of dizziness or lightheadedness in department.  VS have remained stable in department that difficulty.  Patient without arrhythmia or tachycardia while here in the department. Will plan for discharge home with close cardiology/PCP follow-up.  Low suspicion for ACS, PE, dissection, CVA, seizure, central vertigo as cause of fall.  Possibility of recurrent dizziness has been discussed.   Pt has remained hemodynamically stable throughout their time in the ED   Patient is hemodynamically stable and in no acute distress.  Patient able to ambulate in department prior to ED.  Evaluation does not show acute pathology that would require ongoing or additional emergent interventions while in the emergency department or further inpatient treatment.  I have discussed the diagnosis with the patient and answered all questions.  Pain is been managed while in the emergency department and patient has no further complaints prior to  discharge.  Patient is comfortable with plan discussed in room and is stable for discharge at this time.  I have discussed strict return precautions for returning to the emergency department.  Patient seen and evaluation by my attending Dr. Ashok Cordia who agrees with above treatment, plan and disposition. Final Clinical Impressions(s) / ED Diagnoses   Final diagnoses:  Fall, initial encounter  Dizziness on standing  Acute cystitis without hematuria    ED Discharge Orders         Ordered    cephALEXin (KEFLEX) 500 MG capsule  2 times daily     01/03/19 2059           [place] Gamero A, PA-C 01/03/19 2348    Lajean Saver, MD 01/04/19 1545

## 2019-01-03 NOTE — ED Notes (Signed)
Patient ambulated in room with no assistance, no complaints of dizziness or light headiness.  Patient did complain of slight soreness in left leg.

## 2019-01-03 NOTE — ED Triage Notes (Signed)
[facility] reports patient fell on her knees onto her porch. She has an abrasion on both knees and a small one on her left foot. She denies LOC or hitting her head. She denies any neck or back pain.

## 2019-01-03 NOTE — ED Notes (Signed)
Reviewed d/c instructions with pt, who verbalized understanding and had no outstanding questions. Pt departed in NAD.   

## 2019-01-05 LAB — URINE CULTURE: Culture: 40000 — AB

## 2019-01-06 ENCOUNTER — Telehealth: Payer: Self-pay

## 2019-01-06 NOTE — Telephone Encounter (Signed)
D/C Keflex for [facility] ED 01/03/2019  Symptom check per [place]

## 2019-01-18 ENCOUNTER — Telehealth (HOSPITAL_COMMUNITY): Payer: Self-pay

## 2019-01-18 NOTE — Telephone Encounter (Signed)

## 2019-01-19 ENCOUNTER — Telehealth: Payer: Self-pay | Admitting: Cardiology

## 2019-01-19 NOTE — Telephone Encounter (Signed)
New message     Virtual video visit scheduled for 02-03-19.  Pt is having echo on 01-20-19.  Patient gave consent for video visit and she will have a recent bp available for the nurse.  YOUR CARDIOLOGY TEAM HAS ARRANGED FOR AN E-VISIT FOR YOUR APPOINTMENT - PLEASE REVIEW IMPORTANT INFORMATION BELOW SEVERAL DAYS PRIOR TO YOUR APPOINTMENT  Due to the recent COVID-19 pandemic, we are transitioning in-person office visits to tele-medicine visits in an effort to decrease unnecessary exposure to our patients, their families, and staff. These visits are billed to your insurance just like a normal visit is. We also encourage you to sign up for MyChart if you have not already done so. You will need a smartphone if possible. For patients that do not have this, we can still complete the visit using a regular telephone but do prefer a smartphone to enable video when possible. You may have a family member that lives with you that can help. If possible, we also ask that you have a blood pressure cuff and scale at home to measure your blood pressure, heart rate and weight prior to your scheduled appointment. Patients with clinical needs that need an in-person evaluation and testing will still be able to come to the office if absolutely necessary. If you have any questions, feel free to call our office.     YOUR PROVIDER WILL BE USING THE FOLLOWING PLATFORM TO COMPLETE YOUR VISIT: Doximity   IF USING MYCHART - How to Download the MyChart App to Your SmartPhone   - If Apple, go to [employer] and type in MyChart in the search bar and download the app. If Android, ask patient to go to [employer] and type in [place] in the search bar and download the app. The app is free but as with any other app downloads, your phone may require you to verify saved payment information or Apple/Android password.  - You will need to then log into the app with your MyChart username and password, and select [facility] as your  healthcare provider to link the account.  - When it is time for your visit, go to the MyChart app, find appointments, and click Begin Video Visit. Be sure to Select Allow for your device to access the Microphone and Camera for your visit. You will then be connected, and your provider will be with you shortly.  **If you have any issues connecting or need assistance, please contact MyChart service desk [phone])**  **If using a computer, in order to ensure the best quality for your visit, you will need to use either of the following Internet Browsers: Insurance underwriter or Microsoft Edge**   IF USING DOXIMITY or [email or web address] - The staff will give you instructions on receiving your link to join the meeting the day of your visit.      2-3 DAYS BEFORE YOUR APPOINTMENT  You will receive a telephone call from one of our [place] team members - your caller ID may say "Unknown caller." If this is a video visit, we will walk you through how to get the video launched on your phone. We will remind you check your blood pressure, heart rate and weight prior to your scheduled appointment. If you have an Apple Watch or Kardia, please upload any pertinent ECG strips the day before or morning of your appointment to [facility]. Our staff will also make sure you have reviewed the consent and agree to move forward with your scheduled tele-health visit.  THE DAY OF YOUR APPOINTMENT  Approximately 15 minutes prior to your scheduled appointment, you will receive a telephone call from one of [place] team - your caller ID may say "Unknown caller."  Our staff will confirm medications, vital signs for the day and any symptoms you may be experiencing. Please have this information available prior to the time of visit start. It may also be helpful for you to have a pad of paper and pen handy for any instructions given during your visit. They will also walk you through joining the smartphone meeting if this is a  video visit.    CONSENT FOR TELE-HEALTH VISIT - PLEASE REVIEW  I hereby voluntarily request, consent and authorize [facility] and its employed or contracted physicians, physician assistants, nurse practitioners or other licensed health care professionals (the Practitioner), to provide me with telemedicine health care services (the Services") as deemed necessary by the treating Practitioner. I acknowledge and consent to receive the Services by the Practitioner via telemedicine. I understand that the telemedicine visit will involve communicating with the Practitioner through live audiovisual communication technology and the disclosure of certain medical information by electronic transmission. I acknowledge that I have been given the opportunity to request an in-person assessment or other available alternative prior to the telemedicine visit and am voluntarily participating in the telemedicine visit.  I understand that I have the right to withhold or withdraw my consent to the use of telemedicine in the course of my care at any time, without affecting my right to future care or treatment, and that the Practitioner or I may terminate the telemedicine visit at any time. I understand that I have the right to inspect all information obtained and/or recorded in the course of the telemedicine visit and may receive copies of available information for a reasonable fee.  I understand that some of the potential risks of receiving the Services via telemedicine include:   Delay or interruption in medical evaluation due to technological equipment failure or disruption;  Information transmitted may not be sufficient (e.g. poor resolution of images) to allow for appropriate medical decision making by the Practitioner; and/or   In rare instances, security protocols could fail, causing a breach of personal health information.  Furthermore, I acknowledge that it is my responsibility to provide information about my  medical history, conditions and care that is complete and accurate to the best of my ability. I acknowledge that Practitioner's advice, recommendations, and/or decision may be based on factors not within their control, such as incomplete or inaccurate data provided by me or distortions of diagnostic images or specimens that may result from electronic transmissions. I understand that the practice of medicine is not an exact science and that Practitioner makes no warranties or guarantees regarding treatment outcomes. I acknowledge that I will receive a copy of this consent concurrently upon execution via email to the email address I last provided but may also request a printed copy by calling the office of [facility].    I understand that my insurance will be billed for this visit.   I have read or had this consent read to me.  I understand the contents of this consent, which adequately explains the benefits and risks of the Services being provided via telemedicine.   I have been provided ample opportunity to ask questions regarding this consent and the Services and have had my questions answered to my satisfaction.  I give my informed consent for the services to be provided through the  use of telemedicine in my medical care  By participating in this telemedicine visit I agree to the above.

## 2019-01-20 ENCOUNTER — Other Ambulatory Visit: Payer: Self-pay

## 2019-01-20 ENCOUNTER — Ambulatory Visit (HOSPITAL_COMMUNITY): Payer: Medicare Other | Attending: Cardiovascular Disease

## 2019-01-20 DIAGNOSIS — I428 Other cardiomyopathies: Secondary | ICD-10-CM | POA: Diagnosis present

## 2019-01-22 ENCOUNTER — Telehealth: Payer: Self-pay

## 2019-01-22 DIAGNOSIS — I351 Nonrheumatic aortic (valve) insufficiency: Secondary | ICD-10-CM

## 2019-01-22 NOTE — Telephone Encounter (Signed)
Called patient with results. Per Dr. Johnsie Cancel, EF improved quite a bit to 40-45% from 20-25% moderate AR f/u echo in a year. Patient verbalize understanding and thank me for the call. Will place order for echo in one year.

## 2019-01-22 NOTE — Telephone Encounter (Signed)
-----   Message from [name], MD sent at 01/20/2019 10:17 PM EDT ----- EF improved quite a bit to 40-45% from 20-25% moderate AR f/u echo in a year

## 2019-02-02 NOTE — Progress Notes (Signed)
Virtual Visit via Telephone Note   This visit type was conducted due to national recommendations for restrictions regarding the COVID-19 Pandemic (e.g. social distancing) in an effort to limit this patient's exposure and mitigate transmission in our community.  Due to her co-morbid illnesses, this patient is at least at moderate risk for complications without adequate follow up.  This format is felt to be most appropriate for this patient at this time.  The patient did not have access to video technology/had technical difficulties with video requiring transitioning to audio format only (telephone).  All issues noted in this document were discussed and addressed.  No physical exam could be performed with this format.  Please refer to the patient's chart for her  consent to telehealth for [facility].   We attempted video but she could not do at this time  Date:  02/03/2019   ID:  [name], DOB 03/24/1956, MRN [id number]  Patient Location: Home Provider Location: Office  PCP:  Vicenta Aly, [place]  Cardiologist:  Jenkins Rouge, MD  Electrophysiologist:  Cristopher Peru, MD   Evaluation Performed:  Follow-Up Visit  Chief Complaint:  CAD  History of Present Illness:    [name] is a 63 y.o. female with with history of CAD status post inferior MI and PCI 2008 with mild to moderate multivessel CAD on cath in 2009, nonischemic cardiomyopathy LVEF 15% status post ICD with improvement in LV function to 50 to 55% in 2016.  She has a history of V. tach in the past, COPD with ongoing tobacco abuse, heavy salt intake, bilateral SFA occlusions.  Patient was discharged from the hospital July 2019 admission with worsening dyspnea, orthopnea and edema.  LVEF on echo 03/23/2018 down to 20-25 % BNP was elevated at 1069.  Right and left heart catheterization 03/26/2018 done mild nonobstructive CAD with patent stent in the circumflex and mild in-stent restenosis, normal to low left and right  heart filling pressures, low fluid cardiac output/index.   She saw pulmonary 04/03/2018 and CT and PFTs were ordered.Was drinking and smoking a lot prior to admission. When seen by PA 04/07/18 indicated felt much better after abstaining   Seen by pulmonary 06/08/18 Gold stage 2 and on Spiriva now  CT July 2019 showed esophagitis referred to GI   She admits to drinking again on last visit and Dr. Johnsie Cancel discussed the link with cardiomyopathy- With her Husband indicated he would make her stop   New echo 01/20/19 with EF up to 40-45% from 20-25% with moderate AR - to have a follow up echo in 1 year.  Remote device check 12/15/18 with normal.  ABIs done 11/2018 with mild decrease, plan to monitor.    In ER 01/03/19 with fall, no dizziness.  Troponins were neg. EKG was abnormal.  With deep T wave inversions V5-6.    Today no chest pain or SOB.  No swelling and her weight is down to 168 lbs, she put herself on a diet.  She stopped smoking a year ago.  She feels really good.  We discussed her BP that was lower but she has just gotten out of bed.  No dizziness or lightheadedness.  She continues with occ ETOH.  No fevers or cough.  She wears a mask when she goes out.  She understands she is at higher risk for complications for [id number].  No device shocks.  Has not seen Dr. Lovena Le in 2 years but does have remote transmissions.   Needs EP  follow up in 3-4 months.   The patient does not have symptoms concerning for COVID-19 infection (fever, chills, cough, or new shortness of breath).    Past Medical History:  Diagnosis Date  . Acute on chronic systolic heart failure ([place])   . Acute respiratory failure ([place])   . Automatic implantable cardioverter-defibrillator in situ   . CAD (coronary artery disease)    a. Inf-post MI 2008 s/p BMS to large marginal of Cx.   Marland Kitchen COPD (chronic obstructive pulmonary disease) ([place])   . ETOH abuse   . Hyperglycemia   . HYPERLIPIDEMIA-MIXED 02/07/2009  . HYPERTENSION, BENIGN  08/22/2009  . PVD (peripheral vascular disease) ([place])    a. Evaluated by Dr. [name] Anon 08/2012.  Marland Kitchen SYSTOLIC HEART FAILURE, CHRONIC 02/07/2009   a. EF 30% 2010, 29% by MRI 08/2012. b. s/p prophylactic Medtronic ICD implantation 10/2012.  Marland Kitchen TIA (transient ischemic attack) 05/18/2014  . Tobacco abuse   . VENTRICULAR TACHYCARDIA 05/21/2010   Past Surgical History:  Procedure Laterality Date  . CARDIAC DEFIBRILLATOR PLACEMENT  10/2012  . CESAREAN SECTION  1977; 1982  . COLONOSCOPY    . CYSTOSCOPY W/ STONE MANIPULATION  1990's  . ESOPHAGOGASTRODUODENOSCOPY (EGD) WITH PROPOFOL N/A 08/04/2018   Procedure: ESOPHAGOGASTRODUODENOSCOPY (EGD) WITH PROPOFOL;  Surgeon: Gatha Mayer, MD;  Location: [facility] ENDOSCOPY;  Service: Endoscopy;  Laterality: N/A;  . IMPLANTABLE CARDIOVERTER DEFIBRILLATOR IMPLANT N/A 10/21/2012   Procedure: IMPLANTABLE CARDIOVERTER DEFIBRILLATOR IMPLANT;  Surgeon: Evans Lance, MD;  [facility] CATH LAB;  Service: Cardiovascular;  Laterality: N/A;  . RIGHT/LEFT HEART CATH AND CORONARY ANGIOGRAPHY N/A 03/26/2018   Procedure: RIGHT/LEFT HEART CATH AND CORONARY ANGIOGRAPHY;  Surgeon: Nelva Bush, MD;  Location: [facility] CV LAB;  Service: Cardiovascular;  Laterality: N/A;     Current Meds  Medication Sig  . albuterol (PROAIR HFA) 108 (90 Base) MCG/ACT inhaler Inhale 2 puffs into the lungs every 6 (six) hours as needed for wheezing or shortness of breath.  Marland Kitchen aspirin EC 81 MG tablet Take 81 mg by mouth daily.  . carvedilol (COREG) 12.5 MG tablet Take 1 tablet (12.5 mg total) by mouth 2 (two) times daily with a meal.  . feeding supplement, ENSURE COMPLETE, (ENSURE COMPLETE) LIQD Take 237 mLs by mouth daily.  . folic acid (FOLVITE) 1 MG tablet Take 1 mg by mouth daily.   . nitroGLYCERIN (NITROSTAT) 0.4 MG SL tablet Place 1 tablet (0.4 mg total) under the tongue every 5 (five) minutes as needed for chest pain (up to 3 doses).  . sacubitril-valsartan (ENTRESTO) 97-103 MG Take 1 tablet by  mouth 2 (two) times daily.  Marland Kitchen spironolactone (ALDACTONE) 25 MG tablet Take 0.5 tablets (12.5 mg total) by mouth daily.     Allergies:   Clopidogrel bisulfate   Social History   Tobacco Use  . Smoking status: Former Smoker    Packs/day: 0.50    Years: 40.00    Pack years: 20.00    Types: Cigarettes    Last attempt to quit: 04/27/2018    Years since quitting: 0.7  . Smokeless tobacco: Never Used  . Tobacco comment: pt is on nicotine patches   Substance Use Topics  . Alcohol use: Yes    Alcohol/week: 20.0 standard drinks    Types: 20 Cans of beer per week    Comment: 05/18/2014 "2, 40's qod"; 1 beer last week   . Drug use: No     Family Hx: The patient's family history includes Asthma in her brother; Cancer in  her mother; Heart attack in her father; Other in her brother.  ROS:   Please see the history of present illness.    General:no colds or fevers, + weight loss, she put herself on a diet Skin:no rashes or ulcers HEENT:no blurred vision, no congestion CV:see HPI PUL:see HPI GI:no diarrhea constipation or melena, no indigestion GU:no hematuria, no dysuria MS:no joint pain, no claudication Neuro:no syncope, no lightheadedness Endo:no diabetes, no thyroid disease  All other systems reviewed and are negative.   Prior CV studies:   The following studies were reviewed today:  Echo 01/20/19 IMPRESSIONS    1. Severe hypokinesis of the left ventricular, basal-mid inferolateral wall, anterolateral wall and inferior wall.  2. The left ventricle has mild-moderately reduced systolic function, with an ejection fraction of 40-45%. The cavity size was mildly dilated. Left ventricular diastolic Doppler parameters are consistent with impaired relaxation. Elevated mean left  atrial pressure.  3. The right ventricle has normal systolic function. The cavity was normal. There is no increase in right ventricular wall thickness.  4. The aortic valve is tricuspid. Aortic valve  regurgitation is moderate by color flow Doppler.  FINDINGS  Left Ventricle: The left ventricle has mild-moderately reduced systolic function, with an ejection fraction of 40-45%. The cavity size was mildly dilated. There is no increase in left ventricular wall thickness. Left ventricular diastolic Doppler  parameters are consistent with impaired relaxation. Elevated mean left atrial pressure Severe hypokinesis of the left ventricular, basal-mid inferolateral wall, anterolateral wall and inferior wall.  Right Ventricle: The right ventricle has normal systolic function. The cavity was normal. There is no increase in right ventricular wall thickness. Pacing wire/catheter visualized in the right ventricle.  Left Atrium: Left atrial size was normal in size.  Right Atrium: Right atrial size was normal in size. Right atrial pressure is estimated at 10 mmHg.  Interatrial Septum: No atrial level shunt detected by color flow Doppler.  Pericardium: There is no evidence of pericardial effusion.  Mitral Valve: The mitral valve is normal in structure. Mitral valve regurgitation is mild by color flow Doppler.  Tricuspid Valve: The tricuspid valve is normal in structure. Tricuspid valve regurgitation is trivial by color flow Doppler.  Aortic Valve: The aortic valve is tricuspid Aortic valve regurgitation is moderate by color flow Doppler.  Pulmonic Valve: The pulmonic valve was normal in structure. Pulmonic valve regurgitation is trivial by color flow Doppler.  Venous: The inferior vena cava is normal in size with greater than 50% respiratory variability.      Labs/Other Tests and Data Reviewed:    EKG:  An ECG dated 01/03/19 was personally reviewed today and demonstrated:  SR with Q waves III, AVF, deep T wave V5-6  Recent Labs: 03/23/2018: B Natriuretic Peptide 1,069.6 03/26/2018: ALT 36 03/27/2018: Magnesium 1.9 01/03/2019: BUN 8; Creatinine, Ser 0.84; Hemoglobin 14.9; Platelets  PLATELET CLUMPS NOTED ON SMEAR, UNABLE TO ESTIMATE; Potassium 4.0; Sodium 139   Recent Lipid Panel Lab Results  Component Value Date/Time   CHOL 130 03/23/2018 09:00 AM   CHOL 153 02/24/2017 11:54 AM   TRIG 66 03/23/2018 09:00 AM   HDL 39 (L) 03/23/2018 09:00 AM   HDL 74 02/24/2017 11:54 AM   CHOLHDL 3.3 03/23/2018 09:00 AM   LDLCALC 78 03/23/2018 09:00 AM   LDLCALC 67 02/24/2017 11:54 AM    Wt Readings from Last 3 Encounters:  02/03/19 168 lb (76.2 kg)  01/03/19 184 lb (83.5 kg)  08/04/18 171 lb 15.3 oz (78 kg)  Objective:    Vital Signs:  BP 98/80   Pulse 79   Ht 5' 9.5" (1.765 m)   Wt 168 lb (76.2 kg)   BMI 24.45 kg/m    VITAL SIGNS:  reviewed  General by phone strong voice Neuro A&O X 3 follows commands Lungs, no SOB with speaking, can complete full sentences without SOB or wheezes Psych: pleasant affect.   ASSESSMENT & PLAN:    1. NICM with EF to 25-30% on echo, now EF improved to 40-45% she continues on Coreg, entresto and aldactone, last labs 4/20 were stable. She has lost 20 lbs per her scales.  She is trying to lose.  Stable ICD  2. ICD stable 3. CAD with stent in LCX and mild instent stenosis, will check CMP and lipid in July.  4. HLD on statin 5. HTN controlled to low, she had just gotten up per pt.  No dizziness. May be low from wt  Loss, she will check weekly and call if < 993 systolic. 6. Tobacco use, has stopped  + COPD and followed by Pulmonary     COVID-19 Education: The signs and symptoms of COVID-19 were discussed with the patient and how to seek care for testing (follow up with PCP or arrange E-visit).  The importance of social distancing was discussed today.  Time:   Today, I have spent 15 minutes with the patient with telehealth technology discussing the above problems.     Medication Adjustments/Labs and Tests Ordered: Current medicines are reviewed at length with the patient today.  Concerns regarding medicines are outlined above.    Tests Ordered: Orders Placed This Encounter  Procedures  . Comp Met (CMET)  . Lipid panel    Medication Changes: No orders of the defined types were placed in this encounter.   Disposition:  Follow up in 6 month(s)  Signed, Cecilie Kicks, NP  02/03/2019 10:15 AM    [place]

## 2019-02-03 ENCOUNTER — Encounter: Payer: Self-pay | Admitting: Cardiology

## 2019-02-03 ENCOUNTER — Other Ambulatory Visit: Payer: Self-pay

## 2019-02-03 ENCOUNTER — Telehealth (INDEPENDENT_AMBULATORY_CARE_PROVIDER_SITE_OTHER): Payer: Medicare Other | Admitting: Cardiology

## 2019-02-03 VITALS — BP 98/80 | HR 79 | Ht 69.5 in | Wt 168.0 lb

## 2019-02-03 DIAGNOSIS — I428 Other cardiomyopathies: Secondary | ICD-10-CM | POA: Diagnosis not present

## 2019-02-03 DIAGNOSIS — F17200 Nicotine dependence, unspecified, uncomplicated: Secondary | ICD-10-CM

## 2019-02-03 DIAGNOSIS — I10 Essential (primary) hypertension: Secondary | ICD-10-CM

## 2019-02-03 DIAGNOSIS — I2510 Atherosclerotic heart disease of native coronary artery without angina pectoris: Secondary | ICD-10-CM

## 2019-02-03 DIAGNOSIS — E782 Mixed hyperlipidemia: Secondary | ICD-10-CM

## 2019-02-03 DIAGNOSIS — Z95810 Presence of automatic (implantable) cardiac defibrillator: Secondary | ICD-10-CM

## 2019-02-03 NOTE — Patient Instructions (Addendum)
Medication Instructions:  Your physician recommends that you continue on your current medications as directed. Please refer to the Current Medication list given to you today.  If you need a refill on your cardiac medications before your next appointment, please call your pharmacy.   Lab work: July 27TH AT 8:45 A.M.:  CMP & FASTING LIPID.  MAKE SURE YOU COME FASTING.  NOTHING TO EAT OR DRINK AFTER MIDNIGHT.    If you have labs (blood work) drawn today and your tests are completely normal, you will receive your results only by: Marland Kitchen MyChart Message (if you have MyChart) OR . A paper copy in the mail If you have any lab test that is abnormal or we need to change your treatment, we will call you to review the results.  Testing/Procedures: None ordered   Follow-Up: At [facility], you and your health needs are our priority.  As part of our continuing mission to provide you with exceptional heart care, we have created designated Provider Care Teams.  These Care Teams include your primary Cardiologist (physician) and Advanced Practice Providers (APPs -  Physician Assistants and Nurse Practitioners) who all work together to provide you with the care you need, when you need it. You will need a follow up appointment in 6 months.  Please call our office 2 months in advance to schedule this appointment.  You may see Jenkins Rouge, MD or one of the following Advanced Practice Providers on your designated Care Team:   Truitt Merle, NP Cecilie Kicks, NP . Kathyrn Drown, NP  Your physician recommends that you schedule a follow-up appointment in: [place] DR. Lovena Le  Any Other Special Instructions Will Be Listed Below (If Applicable). Monitor your blood pressure daily.  Call our office if your top number continuously runs under 100

## 2019-03-16 ENCOUNTER — Ambulatory Visit (INDEPENDENT_AMBULATORY_CARE_PROVIDER_SITE_OTHER): Payer: Medicare Other | Admitting: *Deleted

## 2019-03-16 DIAGNOSIS — I428 Other cardiomyopathies: Secondary | ICD-10-CM

## 2019-03-17 ENCOUNTER — Telehealth: Payer: Self-pay

## 2019-03-17 LAB — CUP PACEART REMOTE DEVICE CHECK
Battery Remaining Longevity: 67 mo
Battery Voltage: 2.99 V
Brady Statistic RV Percent Paced: 0.06 %
Date Time Interrogation Session: 20200708171529
HighPow Impedance: 82 Ohm
Implantable Lead Implant Date: 20140212
Implantable Lead Location: 753860
Implantable Lead Model: 6935
Implantable Pulse Generator Implant Date: 20140212
Lead Channel Impedance Value: 494 Ohm
Lead Channel Impedance Value: 570 Ohm
Lead Channel Pacing Threshold Amplitude: 0.5 V
Lead Channel Pacing Threshold Pulse Width: 0.4 ms
Lead Channel Sensing Intrinsic Amplitude: 8.25 mV
Lead Channel Sensing Intrinsic Amplitude: 8.25 mV
Lead Channel Setting Pacing Amplitude: 2.5 V
Lead Channel Setting Pacing Pulse Width: 0.4 ms
Lead Channel Setting Sensing Sensitivity: 0.3 mV

## 2019-03-17 NOTE — Telephone Encounter (Signed)
Left message for patient to remind of missed remote transmission.  

## 2019-03-28 ENCOUNTER — Encounter: Payer: Self-pay | Admitting: Cardiology

## 2019-03-28 NOTE — Progress Notes (Signed)
Remote ICD transmission.   

## 2019-04-05 ENCOUNTER — Other Ambulatory Visit: Payer: Medicare Other

## 2019-05-06 ENCOUNTER — Other Ambulatory Visit: Payer: Self-pay

## 2019-05-06 ENCOUNTER — Encounter: Payer: Self-pay | Admitting: Internal Medicine

## 2019-05-06 ENCOUNTER — Ambulatory Visit (INDEPENDENT_AMBULATORY_CARE_PROVIDER_SITE_OTHER): Payer: Medicare Other | Admitting: Internal Medicine

## 2019-05-06 ENCOUNTER — Encounter (INDEPENDENT_AMBULATORY_CARE_PROVIDER_SITE_OTHER): Payer: Self-pay

## 2019-05-06 VITALS — BP 96/58 | HR 56 | Ht 69.5 in | Wt 191.0 lb

## 2019-05-06 DIAGNOSIS — I4729 Other ventricular tachycardia: Secondary | ICD-10-CM

## 2019-05-06 DIAGNOSIS — I472 Ventricular tachycardia: Secondary | ICD-10-CM | POA: Diagnosis not present

## 2019-05-06 DIAGNOSIS — I2510 Atherosclerotic heart disease of native coronary artery without angina pectoris: Secondary | ICD-10-CM

## 2019-05-06 DIAGNOSIS — Z95810 Presence of automatic (implantable) cardiac defibrillator: Secondary | ICD-10-CM | POA: Diagnosis not present

## 2019-05-06 DIAGNOSIS — I5022 Chronic systolic (congestive) heart failure: Secondary | ICD-10-CM

## 2019-05-06 NOTE — Patient Instructions (Signed)
Medication Instructions:  Your physician recommends that you continue on your current medications as directed. Please refer to the Current Medication list given to you today.  Labwork: None ordered.  Testing/Procedures: None ordered.  Follow-Up: Your physician wants you to follow-up in: one year with Dr. Lovena Le.   You will receive a reminder letter in the mail two months in advance. If you don't receive a letter, please call our office to schedule the follow-up appointment.  Remote monitoring is used to monitor your ICD from home. This monitoring reduces the number of office visits required to check your device to one time per year. It allows [place] to keep an eye on the functioning of your device to ensure it is working properly. You are scheduled for a device check from home on 06/15/2019. You may send your transmission at any time that day. If you have a wireless device, the transmission will be sent automatically. After your physician reviews your transmission, you will receive a postcard with your next transmission date.  Any Other Special Instructions Will Be Listed Below (If Applicable).  If you need a refill on your cardiac medications before your next appointment, please call your pharmacy.

## 2019-05-06 NOTE — Progress Notes (Signed)
HPI Ms. Addison returns today for followup. She is a pleasant 63 yo woman with a h/o CAD, VT, chronic systolic heart failure, s/p ICD implant. She has done well in the interim. She denies chest pain, sob, or syncope. No ICD shock. She has been reducing her tobacco use and is now tobacco free! She has gained about 15 lbs.  Allergies  Allergen Reactions  . Clopidogrel Bisulfate Rash     Current Outpatient Medications  Medication Sig Dispense Refill  . albuterol (PROAIR HFA) 108 (90 Base) MCG/ACT inhaler Inhale 2 puffs into the lungs every 6 (six) hours as needed for wheezing or shortness of breath. 1 Inhaler 0  . aspirin EC 81 MG tablet Take 81 mg by mouth daily.    . carvedilol (COREG) 12.5 MG tablet Take 1 tablet (12.5 mg total) by mouth 2 (two) times daily with a meal. 180 tablet 3  . feeding supplement, ENSURE COMPLETE, (ENSURE COMPLETE) LIQD Take 237 mLs by mouth daily.    . folic acid (FOLVITE) 1 MG tablet Take 1 mg by mouth daily.   1  . nitroGLYCERIN (NITROSTAT) 0.4 MG SL tablet Place 1 tablet (0.4 mg total) under the tongue every 5 (five) minutes as needed for chest pain (up to 3 doses). 25 tablet 4  . spironolactone (ALDACTONE) 25 MG tablet Take 0.5 tablets (12.5 mg total) by mouth daily. 15 tablet 0   No current facility-administered medications for this visit.      Past Medical History:  Diagnosis Date  . Acute on chronic systolic heart failure ([place])   . Acute respiratory failure ([place])   . Automatic implantable cardioverter-defibrillator in situ   . CAD (coronary artery disease)    a. Inf-post MI 2008 s/p BMS to large marginal of Cx.   Marland Kitchen COPD (chronic obstructive pulmonary disease) ([place])   . ETOH abuse   . Hyperglycemia   . HYPERLIPIDEMIA-MIXED 02/07/2009  . HYPERTENSION, BENIGN 08/22/2009  . PVD (peripheral vascular disease) ([place])    a. Evaluated by Dr. [name] Anon 08/2012.  Marland Kitchen SYSTOLIC HEART FAILURE, CHRONIC 02/07/2009   a. EF 30% 2010, 29% by MRI 08/2012. b. s/p  prophylactic Medtronic ICD implantation 10/2012.  Marland Kitchen TIA (transient ischemic attack) 05/18/2014  . Tobacco abuse   . VENTRICULAR TACHYCARDIA 05/21/2010    ROS:   All systems reviewed and negative except as noted in the HPI.   Past Surgical History:  Procedure Laterality Date  . CARDIAC DEFIBRILLATOR PLACEMENT  10/2012  . CESAREAN SECTION  1977; 1982  . COLONOSCOPY    . CYSTOSCOPY W/ STONE MANIPULATION  1990's  . ESOPHAGOGASTRODUODENOSCOPY (EGD) WITH PROPOFOL N/A 08/04/2018   Procedure: ESOPHAGOGASTRODUODENOSCOPY (EGD) WITH PROPOFOL;  Surgeon: Gatha Mayer, MD;  Location: [facility];  Service: Endoscopy;  Laterality: N/A;  . IMPLANTABLE CARDIOVERTER DEFIBRILLATOR IMPLANT N/A 10/21/2012   Procedure: IMPLANTABLE CARDIOVERTER DEFIBRILLATOR IMPLANT;  Surgeon: Evans Lance, MD;  Location: [facility] CATH LAB;  Service: Cardiovascular;  Laterality: N/A;  . RIGHT/LEFT HEART CATH AND CORONARY ANGIOGRAPHY N/A 03/26/2018   Procedure: RIGHT/LEFT HEART CATH AND CORONARY ANGIOGRAPHY;  Surgeon: Nelva Bush, MD;  Location[facility];  Service: Cardiovascular;  Laterality: N/A;     Family History  Problem Relation Age of Onset  . Cancer Mother        cervical  . Heart attack Father   . Asthma Brother   . Other Brother        Pacemaker     Social History  Socioeconomic History  . Marital status: Single    Spouse name: Not on file  . Number of children: Not on file  . Years of education: Not on file  . Highest education level: Not on file  Occupational History  . Not on file  Social Needs  . Financial resource strain: Not on file  . Food insecurity    Worry: Not on file    Inability: Not on file  . Transportation needs    Medical: Not on file    Non-medical: Not on file  Tobacco Use  . Smoking status: Former Smoker    Packs/day: 0.50    Years: 40.00    Pack years: 20.00    Types: Cigarettes    Quit date: 04/27/2018    Years since quitting: 1.0  . Smokeless tobacco:  Never Used  . Tobacco comment: pt is on nicotine patches   Substance and Sexual Activity  . Alcohol use: Yes    Alcohol/week: 20.0 standard drinks    Types: 20 Cans of beer per week    Comment: 05/18/2014 "2, 40's qod"; 1 beer last week   . Drug use: No  . Sexual activity: Yes  Lifestyle  . Physical activity    Days per week: Not on file    Minutes per session: Not on file  . Stress: Not on file  Relationships  . Social Herbalist on phone: Not on file    Gets together: Not on file    Attends religious service: Not on file    Active member of club or organization: Not on file    Attends meetings of clubs or organizations: Not on file    Relationship status: Not on file  . Intimate partner violence    Fear of current or ex partner: Not on file    Emotionally abused: Not on file    Physically abused: Not on file    Forced sexual activity: Not on file  Other Topics Concern  . Not on file  Social History Narrative   Single - disabled - last job at [employer]   + alcohol no smoking no substances     BP (!) 96/58   Pulse (!) 56   Ht 5' 9.5" (1.765 m)   Wt 191 lb (86.6 kg)   SpO2 97%   BMI 27.80 kg/m   Physical Exam:  Well appearing NAD HEENT: Unremarkable Neck:  No JVD, no thyromegally Lymphatics:  No adenopathy Back:  No CVA tenderness Lungs:  Clear HEART:  IRegular rate rhythm, no murmurs, no rubs, no clicks Abd:  soft, positive bowel sounds, no organomegally, no rebound, no guarding Ext:  2 plus pulses, no edema, no cyanosis, no clubbing Skin:  No rashes no nodules Neuro:  CN II through XII intact, motor grossly intact  DEVICE  Normal device function.  See PaceArt for details.   Assess/Plan: 1. Chronic systolic heart failure - her symptoms are class 2. She is encouraged to continue her current meds and maintain a low sodium diet. 2. ICD - her medtronic device is working normally. 3. VT - she has had no additional sustained VT since her  last visit.  4. Tobacco abuse - she is in remission. I encouraged her to continue to stop smoking.  Mikle Bosworth.D.

## 2019-05-24 ENCOUNTER — Other Ambulatory Visit: Payer: Self-pay | Admitting: Cardiovascular Disease

## 2019-05-25 ENCOUNTER — Other Ambulatory Visit: Payer: Self-pay

## 2019-05-25 MED ORDER — CARVEDILOL 12.5 MG PO TABS: 12.5000 mg | ORAL_TABLET | Freq: Two times a day (BID) | ORAL | 3 refills | Status: DC

## 2019-05-25 NOTE — Telephone Encounter (Signed)
Pt's pharmacy is requesting a refill on Entresto. This medication was D/C off of pt's medication list. Does pt still suppose to be taking this medication? Please address

## 2019-06-15 ENCOUNTER — Ambulatory Visit: Payer: Medicare Other | Admitting: *Deleted

## 2019-06-16 LAB — CUP PACEART REMOTE DEVICE CHECK
Battery Remaining Longevity: 59 mo
Battery Voltage: 2.99 V
Brady Statistic RV Percent Paced: 0.08 %
Date Time Interrogation Session: 20201006150824
HighPow Impedance: 77 Ohm
Implantable Lead Implant Date: 20140212
Implantable Lead Location: 753860
Implantable Lead Model: 6935
Implantable Pulse Generator Implant Date: 20140212
Lead Channel Impedance Value: 513 Ohm
Lead Channel Impedance Value: 532 Ohm
Lead Channel Pacing Threshold Amplitude: 0.5 V
Lead Channel Pacing Threshold Pulse Width: 0.4 ms
Lead Channel Sensing Intrinsic Amplitude: 8.25 mV
Lead Channel Sensing Intrinsic Amplitude: 8.25 mV
Lead Channel Setting Pacing Amplitude: 2.5 V
Lead Channel Setting Pacing Pulse Width: 0.4 ms
Lead Channel Setting Sensing Sensitivity: 0.3 mV

## 2019-06-22 ENCOUNTER — Other Ambulatory Visit: Payer: Self-pay

## 2019-06-22 ENCOUNTER — Ambulatory Visit (HOSPITAL_COMMUNITY)
Admission: EM | Admit: 2019-06-22 | Discharge: 2019-06-22 | Disposition: A | Discharge status: Home / Self Care | Payer: Medicare Other | Attending: Internal Medicine | Admitting: Internal Medicine

## 2019-06-22 DIAGNOSIS — Z20828 Contact with and (suspected) exposure to other viral communicable diseases: Secondary | ICD-10-CM | POA: Diagnosis not present

## 2019-06-22 DIAGNOSIS — Z20822 Contact with and (suspected) exposure to covid-19: Secondary | ICD-10-CM

## 2019-06-22 NOTE — ED Provider Notes (Signed)
[place]    CSN: [id number] Arrival date & time: 06/22/19  1311      History   Chief Complaint Chief Complaint  Patient presents with  . Covid Testing    HPI Karen WADDEL is a 63 y.o. female.   Patient is a 63 year old female with past medical history of heart failure, respiratory failure, CAD, COPD, EtOH abuse, hypertension, hyperlipidemia, PVD, systolic heart failure, TIA.  She presents today requesting COVID testing.  Reporting that her roommate tested positive on Monday.   Would like to be tested.  She is currently asymptomatic.     Past Medical History:  Diagnosis Date  . Acute on chronic systolic heart failure ([place])   . Acute respiratory failure ([place])   . Automatic implantable cardioverter-defibrillator in situ   . CAD (coronary artery disease)    a. Inf-post MI 2008 s/p BMS to large marginal of Cx.   Marland Kitchen COPD (chronic obstructive pulmonary disease) ([place])   . ETOH abuse   . Hyperglycemia   . HYPERLIPIDEMIA-MIXED 02/07/2009  . HYPERTENSION, BENIGN 08/22/2009  . PVD (peripheral vascular disease) ([place])    a. Evaluated by Dr. [name] Anon 08/2012.  Marland Kitchen SYSTOLIC HEART FAILURE, CHRONIC 02/07/2009   a. EF 30% 2010, 29% by MRI 08/2012. b. s/p prophylactic Medtronic ICD implantation 10/2012.  Marland Kitchen TIA (transient ischemic attack) 05/18/2014  . Tobacco abuse   . VENTRICULAR TACHYCARDIA 05/21/2010    Patient Active Problem List   Diagnosis Date Noted  . Abnormal CT scan, esophagus   . CAD (coronary artery disease)   . Acute respiratory failure ([place])   . Acute on chronic systolic heart failure ([place])   . Hyperglycemia   . COPD exacerbation ([place])   . CAP (community acquired pneumonia) 05/17/2016  . Essential hypertension   . COPD with acute exacerbation ([place]) 12/02/2014  . Systolic heart failure ([place]) 12/02/2014  . Dyspnea 12/02/2014  . Alcohol use ([place]) 12/02/2014  . Elevated hemoglobin ([place]) 12/02/2014  . Acute bronchitis 12/02/2014  . Acute respiratory failure with  hypoxia ([place])   . Malnutrition of moderate degree ([place]) 05/19/2014  . Right facial numbness 05/18/2014  . Weakness of right lower extremity 05/18/2014  . TIA (transient ischemic attack) 05/18/2014  . 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  . Smoker 08/11/2012  . PVD (peripheral vascular disease) ([place]) 08/11/2012  . VENTRICULAR TACHYCARDIA 05/21/2010  . BEN HTN HEART DISEASE WITHOUT HEART FAIL 08/28/2009  . HYPERTENSION, BENIGN 08/22/2009  . CHEST PAIN-PRECORDIAL 07/18/2009  . CARDIOVASCULAR FUNCTION STUDY, ABNORMAL 07/18/2009  . Elevated lipids 02/07/2009  . CAD, NATIVE VESSEL 02/07/2009  . Nonischemic cardiomyopathy ([place]) 02/07/2009  . SYSTOLIC HEART FAILURE, CHRONIC 02/07/2009    Past Surgical History:  Procedure Laterality Date  . CARDIAC DEFIBRILLATOR PLACEMENT  10/2012  . CESAREAN SECTION  1977; 1982  . COLONOSCOPY    . CYSTOSCOPY W/ STONE MANIPULATION  1990's  . ESOPHAGOGASTRODUODENOSCOPY (EGD) WITH PROPOFOL N/A 08/04/2018   Procedure: ESOPHAGOGASTRODUODENOSCOPY (EGD) WITH PROPOFOL;  Surgeon: Gatha Mayer, MD;  [facility];  Service: Endoscopy;  Laterality: N/A;  . IMPLANTABLE CARDIOVERTER DEFIBRILLATOR IMPLANT N/A 10/21/2012   Procedure: IMPLANTABLE CARDIOVERTER DEFIBRILLATOR IMPLANT;  Surgeon: Evans Lance, MD;  Location: [facility] CATH LAB;  Service: Cardiovascular;  Laterality: N/A;  . RIGHT/LEFT HEART CATH AND CORONARY ANGIOGRAPHY N/A 03/26/2018   Procedure: RIGHT/LEFT HEART CATH AND CORONARY ANGIOGRAPHY;  Surgeon: Nelva Bush, MD;  Location: [facility] CV LAB;  Service: Cardiovascular;  Laterality: N/A;    OB History  No obstetric history on file.      Home Medications    Prior to Admission medications   Medication Sig Start Date End Date Taking? Authorizing Provider  albuterol (PROAIR HFA) 108 (90 Base) MCG/ACT inhaler Inhale 2 puffs into the lungs every 6 (six) hours as needed for wheezing or shortness of breath.  03/27/18 07/29/19 Yes Elodia Florence., MD  aspirin EC 81 MG tablet Take 81 mg by mouth daily.   Yes [provider]  carvedilol (COREG) 12.5 MG tablet Take 1 tablet (12.5 mg total) by mouth 2 (two) times daily with a meal. 05/25/19  Yes [name], MD  ENTRESTO 97-103 MG TAKE 1 TABLET BY MOUTH TWICE A DAY 05/26/19  Yes [name], MD  feeding supplement, ENSURE COMPLETE, (ENSURE COMPLETE) LIQD Take 237 mLs by mouth daily. 05/19/14  Yes Mikhail, [place], DO  folic acid (FOLVITE) 1 MG tablet Take 1 mg by mouth daily.  05/08/18  Yes [provider]  spironolactone (ALDACTONE) 25 MG tablet Take 0.5 tablets (12.5 mg total) by mouth daily. 03/28/18 07/29/19 Yes Elodia Florence., MD  nitroGLYCERIN (NITROSTAT) 0.4 MG SL tablet Place 1 tablet (0.4 mg total) under the tongue every 5 (five) minutes as needed for chest pain (up to 3 doses). 11/03/17   [name], MD    Family History Family History  Problem Relation Age of Onset  . Cancer Mother        cervical  . Heart attack Father   . Asthma Brother   . Other Brother        Pacemaker    Social History Social History   Tobacco Use  . Smoking status: Former Smoker    Packs/day: 0.50    Years: 40.00    Pack years: 20.00    Types: Cigarettes    Quit date: 04/27/2018    Years since quitting: 1.1  . Smokeless tobacco: Never Used  . Tobacco comment: pt is on nicotine patches   Substance Use Topics  . Alcohol use: Yes    Alcohol/week: 20.0 standard drinks    Types: 20 Cans of beer per week    Comment: 05/18/2014 "2, 40's qod"; 1 beer last week   . Drug use: No     Allergies   Clopidogrel bisulfate   Review of Systems Review of Systems   Physical Exam Triage Vital Signs ED Triage Vitals  Enc Vitals Group     BP 06/22/19 1343 122/65     Pulse Rate 06/22/19 1343 62     Resp 06/22/19 1343 12     Temp 06/22/19 1343 (!) 97.3 F (36.3 C)     Temp Source 06/22/19 1343 Temporal     SpO2 06/22/19  1343 99 %     Weight --      Height --      Head Circumference --      Peak Flow --      Pain Score 06/22/19 1340 0     Pain Loc --      Pain Edu? --      Excl. in [place]? --    No data found.  Updated Vital Signs BP 122/65 (BP Location: Left Arm)   Pulse 62   Temp (!) 97.3 F (36.3 C) (Temporal)   Resp 12   SpO2 99%   Visual Acuity Right Eye Distance:   Left Eye Distance:   Bilateral Distance:    Right Eye Near:  Left Eye Near:    Bilateral Near:     Physical Exam Vitals signs and nursing note reviewed.  Constitutional:      General: She is not in acute distress.    Appearance: Normal appearance. She is not ill-appearing, toxic-appearing or diaphoretic.  HENT:     Head: Normocephalic.     Nose: Nose normal.     Mouth/Throat:     Pharynx: Oropharynx is clear.  Eyes:     Conjunctiva/sclera: Conjunctivae normal.  Neck:     Musculoskeletal: Normal range of motion.  Pulmonary:     Effort: Pulmonary effort is normal.  Musculoskeletal: Normal range of motion.  Skin:    General: Skin is warm and dry.     Findings: No rash.  Neurological:     Mental Status: She is alert.  Psychiatric:        Mood and Affect: Mood normal.      UC Treatments / Results  Labs (all labs ordered are listed, but only abnormal results are displayed) Labs Reviewed  NOVEL CORONAVIRUS, NAA (HOSP ORDER, SEND-OUT TO REF LAB; TAT 18-24 HRS)    EKG   Radiology No results found.  Procedures Procedures (including critical care time)  Medications Ordered in UC Medications - No data to display  Initial Impression / Assessment and Plan / UC Course  I have reviewed the triage vital signs and the nursing notes.  Pertinent labs & imaging results that were available during my care of the patient were reviewed by me and considered in my medical decision making (see chart for details).     COVID testing done with labs pending. Final Clinical Impressions(s) / UC Diagnoses   Final  diagnoses:  Close exposure to COVID-19 virus     Discharge Instructions     We will call you if your COVID results are positive    ED Prescriptions    None     PDMP not reviewed this encounter.   Orvan July, NP 06/22/19 1405

## 2019-06-22 NOTE — ED Triage Notes (Signed)
Pt would like to be tested for Covid.  She has no signs or symptoms.  She states her roommate tested positive on Monday, but she has no interaction with him.

## 2019-06-22 NOTE — Discharge Instructions (Addendum)
We will call you if your COVID results are positive

## 2019-06-23 LAB — NOVEL CORONAVIRUS, NAA (HOSP ORDER, SEND-OUT TO REF LAB; TAT 18-24 HRS): SARS-CoV-2, NAA: NOT DETECTED

## 2019-08-08 LAB — CUP PACEART INCLINIC DEVICE CHECK
Battery Remaining Longevity: 69 mo
Battery Voltage: 2.92 V
Brady Statistic RV Percent Paced: 0.09 %
Date Time Interrogation Session: 20200827100900
HighPow Impedance: 86 Ohm
Implantable Lead Implant Date: 20140212
Implantable Lead Location: 753860
Implantable Lead Model: 6935
Implantable Pulse Generator Implant Date: 20140212
Lead Channel Impedance Value: 532 Ohm
Lead Channel Impedance Value: 589 Ohm
Lead Channel Pacing Threshold Amplitude: 0.5 V
Lead Channel Pacing Threshold Pulse Width: 0.4 ms
Lead Channel Sensing Intrinsic Amplitude: 12.25 mV
Lead Channel Sensing Intrinsic Amplitude: 8.5 mV
Lead Channel Setting Pacing Amplitude: 2.5 V
Lead Channel Setting Pacing Pulse Width: 0.4 ms
Lead Channel Setting Sensing Sensitivity: 0.3 mV

## 2019-09-14 ENCOUNTER — Ambulatory Visit (INDEPENDENT_AMBULATORY_CARE_PROVIDER_SITE_OTHER): Payer: Medicare Other | Admitting: *Deleted

## 2019-09-14 DIAGNOSIS — I428 Other cardiomyopathies: Secondary | ICD-10-CM

## 2019-09-15 LAB — CUP PACEART REMOTE DEVICE CHECK
Battery Remaining Longevity: 64 mo
Battery Voltage: 2.98 V
Brady Statistic RV Percent Paced: 0.03 %
Date Time Interrogation Session: 20210105211904
HighPow Impedance: 78 Ohm
Implantable Lead Implant Date: 20140212
Implantable Lead Location: 753860
Implantable Lead Model: 6935
Implantable Pulse Generator Implant Date: 20140212
Lead Channel Impedance Value: 494 Ohm
Lead Channel Impedance Value: 532 Ohm
Lead Channel Pacing Threshold Amplitude: 0.5 V
Lead Channel Pacing Threshold Pulse Width: 0.4 ms
Lead Channel Sensing Intrinsic Amplitude: 8.375 mV
Lead Channel Sensing Intrinsic Amplitude: 8.375 mV
Lead Channel Setting Pacing Amplitude: 2.5 V
Lead Channel Setting Pacing Pulse Width: 0.4 ms
Lead Channel Setting Sensing Sensitivity: 0.3 mV

## 2019-12-14 ENCOUNTER — Ambulatory Visit (INDEPENDENT_AMBULATORY_CARE_PROVIDER_SITE_OTHER): Payer: Medicare Other | Admitting: *Deleted

## 2019-12-14 DIAGNOSIS — I428 Other cardiomyopathies: Secondary | ICD-10-CM | POA: Diagnosis not present

## 2019-12-15 LAB — CUP PACEART REMOTE DEVICE CHECK
Battery Remaining Longevity: 60 mo
Battery Voltage: 2.98 V
Brady Statistic RV Percent Paced: 0.08 %
Date Time Interrogation Session: 20210407111027
HighPow Impedance: 84 Ohm
Implantable Lead Implant Date: 20140212
Implantable Lead Location: 753860
Implantable Lead Model: 6935
Implantable Pulse Generator Implant Date: 20140212
Lead Channel Impedance Value: 494 Ohm
Lead Channel Impedance Value: 570 Ohm
Lead Channel Pacing Threshold Amplitude: 0.5 V
Lead Channel Pacing Threshold Pulse Width: 0.4 ms
Lead Channel Sensing Intrinsic Amplitude: 8 mV
Lead Channel Sensing Intrinsic Amplitude: 8 mV
Lead Channel Setting Pacing Amplitude: 2.5 V
Lead Channel Setting Pacing Pulse Width: 0.4 ms
Lead Channel Setting Sensing Sensitivity: 0.3 mV

## 2020-02-14 ENCOUNTER — Other Ambulatory Visit: Payer: Self-pay | Admitting: *Deleted

## 2020-02-14 ENCOUNTER — Telehealth: Payer: Self-pay | Admitting: Internal Medicine

## 2020-02-14 NOTE — Telephone Encounter (Signed)
Received a fax from [place] to refill Stiolto.  Patient was last seen in the office on 06/08/2018.  Unable to refill without an ov.  Called patient and left a vm for her to call the office to schedule an ov in order to refill her stiolto.

## 2020-02-24 ENCOUNTER — Telehealth: Payer: Self-pay | Admitting: Internal Medicine

## 2020-02-24 NOTE — Telephone Encounter (Signed)
Spoke with patient. She stated that she has had a productive cough with thick clear phlegm for the past week. She also developed a sore throat that started yesterday. She has been taking OTC cough and cold medications with no relief. She is UTD on her COVID vaccine and denies being around anyone sick within the last few days.   She is also in the process of getting scheduled for an OV with MR as she has not been seen since 2019.   Pharmacy is [facility] on [address].   MR, can you please advise. Thanks!

## 2020-03-01 MED ORDER — BENZONATATE 100 MG PO CAPS: 100.0000 mg | ORAL_CAPSULE | Freq: Three times a day (TID) | ORAL | 0 refills | Status: DC

## 2020-03-01 NOTE — Telephone Encounter (Signed)
Called pt and advised message from the provider. Pt understood and verbalized understanding. Nothing further is needed.    

## 2020-03-01 NOTE — Telephone Encounter (Signed)
Routing to APP of the day 

## 2020-03-01 NOTE — Telephone Encounter (Signed)
Please send in tessalon perles 100 mg TID for her as needed, she can also take chlor-tabs over the counter every 4 hours for cough

## 2020-03-14 ENCOUNTER — Ambulatory Visit (INDEPENDENT_AMBULATORY_CARE_PROVIDER_SITE_OTHER): Payer: Medicare Other | Admitting: *Deleted

## 2020-03-14 DIAGNOSIS — I428 Other cardiomyopathies: Secondary | ICD-10-CM | POA: Diagnosis not present

## 2020-03-14 LAB — CUP PACEART REMOTE DEVICE CHECK
Battery Remaining Longevity: 53 mo
Battery Voltage: 2.98 V
Brady Statistic RV Percent Paced: 0.06 %
Date Time Interrogation Session: 20210706113114
HighPow Impedance: 66 Ohm
Implantable Lead Implant Date: 20140212
Implantable Lead Location: 753860
Implantable Lead Model: 6935
Implantable Pulse Generator Implant Date: 20140212
Lead Channel Impedance Value: 494 Ohm
Lead Channel Impedance Value: 570 Ohm
Lead Channel Pacing Threshold Amplitude: 0.5 V
Lead Channel Pacing Threshold Pulse Width: 0.4 ms
Lead Channel Sensing Intrinsic Amplitude: 7.125 mV
Lead Channel Sensing Intrinsic Amplitude: 7.125 mV
Lead Channel Setting Pacing Amplitude: 2.5 V
Lead Channel Setting Pacing Pulse Width: 0.4 ms
Lead Channel Setting Sensing Sensitivity: 0.3 mV

## 2020-03-15 ENCOUNTER — Telehealth: Payer: Self-pay | Admitting: *Deleted

## 2020-03-15 NOTE — Telephone Encounter (Signed)
Spoke with pt to discuss possible fluid accumulation since 03/03/20. Pt states she has had a slight cough but it has improved a lot since starting on Tessalon on 03/01/20 (prescribed by pulmonologist). She denies any weight gain, LE edema, abdominal distention, or increased SOB. Pt is interested in [place], will forward message to Margarita Grizzle, [place]. Encouraged pt to call back if she notes any HF symptoms. Pt verbalizes appreciation of call and denies additional questions or concerns at this time.

## 2020-03-16 NOTE — Progress Notes (Signed)
Remote ICD transmission.   

## 2020-03-28 ENCOUNTER — Other Ambulatory Visit: Payer: Self-pay

## 2020-03-28 ENCOUNTER — Encounter: Payer: Self-pay | Admitting: Internal Medicine

## 2020-03-28 ENCOUNTER — Ambulatory Visit (INDEPENDENT_AMBULATORY_CARE_PROVIDER_SITE_OTHER): Payer: Medicare Other | Admitting: Internal Medicine

## 2020-03-28 VITALS — BP 124/64 | HR 63 | Temp 98.3°F | Ht 69.5 in | Wt 180.4 lb

## 2020-03-28 DIAGNOSIS — Z7189 Other specified counseling: Secondary | ICD-10-CM | POA: Diagnosis not present

## 2020-03-28 DIAGNOSIS — J439 Emphysema, unspecified: Secondary | ICD-10-CM | POA: Diagnosis not present

## 2020-03-28 DIAGNOSIS — R053 Chronic cough: Secondary | ICD-10-CM

## 2020-03-28 DIAGNOSIS — R05 Cough: Secondary | ICD-10-CM

## 2020-03-28 DIAGNOSIS — Z7185 Encounter for immunization safety counseling: Secondary | ICD-10-CM

## 2020-03-28 MED ORDER — ALBUTEROL SULFATE HFA 108 (90 BASE) MCG/ACT IN AERS: 2.0000 | INHALATION_SPRAY | Freq: Four times a day (QID) | RESPIRATORY_TRACT | 5 refills | Status: AC | PRN

## 2020-03-28 NOTE — Progress Notes (Signed)
IOV 04/03/2018  Chief Complaint  Patient presents with  . Consult    Referred by heart failure. Pt was admitted to the hospital 7/15-7/19 due to resp failure and due to the admission is why pt is here for the consult. Pt was placed on O2 while at the hospital but states she was not sent home on O2. Pt states she does not have any current complaints of cough, SOB, or CP.   [name] , 64 y.o. , with dob [birth year] and female ,Not Hispanic or Latino from [address] [name] [id number] - presents to lung clinic for eval of copd. She herself is not sure why she is in lung clinic. History is gained from talking to her and review of the medical record. Her granddaughter is here with her but does not give any history. As best as I can gather she suffers from chronic systolic heart failure and in July 2019 had a heart failure exacerbation and admission. Chest x-ray visualized which was done at that time showed pulmonary congestion. Review of the cardiology notes indicate that she has COPD and a felt she is best served under a pulmonologist. According to the patient she is aware of a COPD diagnosis for the last few years. She was previously on Symbicort but had oral intolerance to it. Therefore after the recent admission cardiologist switched her to [facility] but she does not like that either. Currently she feels improved and at baseline after the heart failure exacerbation in July 2019. She is also taking efforts to quit smoking which she quit 2 weeks ago. She only has mild exertional shortness of breath but no cough or sputum production and she feels overall she's stable. She does not think her COPD inhaler helps her. Review of the chart indicates that she's not had pulmonary function test but the diagnosis of COPD emphysema seen on CT scan of the chest in July 2018 and before. I personally visualized the CT chest. The symptoms score is documented below  There are also multiple nodules on  the lung CT which are visualized.     Results for Karen, [name] (MRN [id number]) as of 04/03/2018 11:11  Ref. Range 03/27/2018 05:52  Creatinine Latest Ref Range: 0.44 - 1.00 mg/dL 0.66  Results for Karen, [name] (MRN [id number]) as of 04/03/2018 11:11  Ref. Range 03/27/2018 05:52  Hemoglobin Latest Ref Range: 12.0 - 15.0 g/dL 16.4 (H)     OV 06/08/2018  Subjective:  Patient ID: [name], female , DOB: 1956/02/01 , age 51 y.o. , MRN: [id number] , ADDRESS: [address] [place] [address]   06/08/2018 -   Chief Complaint  Patient presents with  . Follow-up    PFT completed today     HPI [name] 64 y.o. -follow-up COPD/emphysema and other issues  COPD: She is now on Spiriva.  She says it is helping her but COPD CAT score is unchanged.  Symptom score is 12.  Nevertheless she feels better.  I offered a flu shot but she declined.  She says she is already had a pneumonia shot.  Pulmonary function test today shows Gold stage II COPD with bronchodilator response in the low DLCO.  She wants a letter from [place] saying that she should be able to park right in front of the home because right now the landlord makes a park some distance away and she has to stop because of dyspnea  on exertion and arthritis of the hip  Lung nodules on CT scan of the chest: She had CT scan of the chest that show the lung nodules have resolved  Smoking: She is quit smoking in the last 6 weeks  New issue-CT scan of the chest July 2019 shows esophagitis.  We made a referral to the GI doctor but I do not see any appointment do not know what happened   OV 03/28/2020  Subjective:  Patient ID: [name], female , DOB: 01/14/1956 , age 38 y.o. , MRN: [id number] , ADDRESS: [address] [place] [address]   03/28/2020 -   Chief Complaint  Patient presents with  . Follow-up    "hacking" cough   Follow-up COPD and smoking remission.  HPI [name] 64 y.o.  -returns for follow-up of her COPD.  I not seen her nearly 2 years.  She says smoking is still in remission.  She continues to have dyspnea on exertion for climbing stairs but not for other activities.  She still has a mild cough with early morning white sputum.  This cough is bothering her but it is stable.  No new medical problems in the last 1 year.  She has not yet had the Covid vaccine.  She has no allergies other than Plavix causing rash.  She is scared of getting the vaccine and the side effects.  I counseled her about this especially with surging delta variant.  She takes Best boy for her cough and it helps.  Last CT chest was 2019 and her nodules have resolved.     CAT COPD Symptom & Quality of Life Score (GSK trademark) 0 is no burden. 5 is highest burden 04/03/2018  06/08/2018   Never Cough -> Cough all the time 3 3  No phlegm in chest -> Chest is full of phlegm 5 0  No chest tightness -> Chest feels very tight 0 0  No dyspnea for 1 flight stairs/hill -> Very dyspneic for 1 flight of stairs 0 3  No limitations for ADL at home -> Very limited with ADL at home 1 3  Confident leaving home -> Not at all confident leaving home 0 0  Sleep soundly -> Do not sleep soundly because of lung condition 0 0  Lots of Energy -> No energy at all 1 3  TOTAL Score (max 40)  10 12   CAT Score 06/08/2018  Total CAT Score 12       Simple office walk 185 feet x  3 laps goal with forehead probe 04/03/2018   O2 used Room air  Number laps completed 3  Comments about pace Normal pace  Resting Pulse Ox/HR 100% and 66/min  Final Pulse Ox/HR 99% and 19/min  Desaturated </= 88% no  Desaturated <= 3% points no  Got Tachycardic >/= 90/min no  Symptoms at end of test none  Miscellaneous comments Normal walk   Results for Karen, [name] (MRN [id number]) as of 06/08/2018 17:05  Ref. Range 06/08/2018 14:41  FEV1-Post Latest Units: L 1.64  FEV1-%Pred-Post Latest Units: % 69  FEV1-%Change-Post  Latest Units: % 7  Results for Karen, [name] (MRN [id number]) as of 06/08/2018 17:05  Ref. Range 06/08/2018 14:41  DLCO unc Latest Units: ml/min/mmHg 10.90  DLCO unc % pred Latest Units: % 38   Results for Karen, [name] (MRN [id number]) as of 03/28/2020 16:06  Ref. Range 01/03/2019 20:42  Troponin I Latest Ref Range: <0.03  ng/mL <0.03    ROS - per HPI     has a past medical history of Acute on chronic systolic heart failure ([place]), Acute respiratory failure ([place]), Automatic implantable cardioverter-defibrillator in situ, CAD (coronary artery disease), COPD (chronic obstructive pulmonary disease) ([place]), ETOH abuse, Hyperglycemia, HYPERLIPIDEMIA-MIXED (02/07/2009), HYPERTENSION, BENIGN (08/22/2009), PVD (peripheral vascular disease) ([place]), SYSTOLIC HEART FAILURE, CHRONIC (02/07/2009), TIA (transient ischemic attack) (05/18/2014), Tobacco abuse, and VENTRICULAR TACHYCARDIA (05/21/2010).   reports that she quit smoking about 23 months ago. Her smoking use included cigarettes. She has a 20.00 pack-year smoking history. She has never used smokeless tobacco.  Past Surgical History:  Procedure Laterality Date  . CARDIAC DEFIBRILLATOR PLACEMENT  10/2012  . CESAREAN SECTION  1977; 1982  . COLONOSCOPY    . CYSTOSCOPY W/ STONE MANIPULATION  1990's  . ESOPHAGOGASTRODUODENOSCOPY (EGD) WITH PROPOFOL N/A 08/04/2018   Procedure: ESOPHAGOGASTRODUODENOSCOPY (EGD) WITH PROPOFOL;  Surgeon: Gatha Mayer, MD;  Location: [facility] ENDOSCOPY;  Service: Endoscopy;  Laterality: N/A;  . IMPLANTABLE CARDIOVERTER DEFIBRILLATOR IMPLANT N/A 10/21/2012   Procedure: IMPLANTABLE CARDIOVERTER DEFIBRILLATOR IMPLANT;  Surgeon: Evans Lance, MD;  Location: [facility] CATH LAB;  Service: Cardiovascular;  Laterality: N/A;  . RIGHT/LEFT HEART CATH AND CORONARY ANGIOGRAPHY N/A 03/26/2018   Procedure: RIGHT/LEFT HEART CATH AND CORONARY ANGIOGRAPHY;  Surgeon: Nelva Bush, MD;  Location: [facility] CV LAB;  Service: Cardiovascular;   Laterality: N/A;    Allergies  Allergen Reactions  . Clopidogrel Bisulfate Rash    Immunization History  Administered Date(s) Administered  . Influenza,inj,Quad PF,6+ Mos 12/03/2014  . Pneumococcal Polysaccharide-23 12/03/2014  . Pneumococcal-Unspecified 12/03/2014  . Tdap 01/03/2019    Family History  Problem Relation Age of Onset  . Cancer Mother        cervical  . Heart attack Father   . Asthma Brother   . Other Brother        Pacemaker     Current Outpatient Medications:  .  albuterol (PROAIR HFA) 108 (90 Base) MCG/ACT inhaler, Inhale 2 puffs into the lungs every 6 (six) hours as needed for wheezing or shortness of breath., Disp: 1 Inhaler, Rfl: 0 .  aspirin EC 81 MG tablet, Take 81 mg by mouth daily., Disp: , Rfl:  .  benzonatate (TESSALON) 100 MG capsule, Take 1 capsule (100 mg total) by mouth 3 (three) times daily., Disp: 30 capsule, Rfl: 0 .  carvedilol (COREG) 12.5 MG tablet, Take 1 tablet (12.5 mg total) by mouth 2 (two) times daily with a meal., Disp: 180 tablet, Rfl: 3 .  ENTRESTO 97-103 MG, TAKE 1 TABLET BY MOUTH TWICE A DAY, Disp: 180 tablet, Rfl: 3 .  feeding supplement, ENSURE COMPLETE, (ENSURE COMPLETE) LIQD, Take 237 mLs by mouth daily., Disp: , Rfl:  .  folic acid (FOLVITE) 1 MG tablet, Take 1 mg by mouth daily. , Disp: , Rfl: 1 .  nitroGLYCERIN (NITROSTAT) 0.4 MG SL tablet, Place 1 tablet (0.4 mg total) under the tongue every 5 (five) minutes as needed for chest pain (up to 3 doses)., Disp: 25 tablet, Rfl: 4 .  spironolactone (ALDACTONE) 25 MG tablet, Take 0.5 tablets (12.5 mg total) by mouth daily., Disp: 15 tablet, Rfl: 0      Objective:   Vitals:   03/28/20 1543  BP: 124/64  Pulse: 63  Temp: 98.3 F (36.8 C)  TempSrc: Oral  SpO2: 99%  Weight: 180 lb 6.4 oz (81.8 kg)  Height: 5' 9.5" (1.765 m)    Estimated body mass index is 26.26 kg/m  as calculated from the following:   Height as of this encounter: 5' 9.5" (1.765 m).   Weight as of this  encounter: 180 lb 6.4 oz (81.8 kg).  @WEIGHTCHANGE @  [employer]   03/28/20 1543  Weight: 180 lb 6.4 oz (81.8 kg)     Physical Exam Thin lady.  Overall nonfocal exam.  Alert and oriented x3.  Clear to auscultation bilaterally with mild barrel chest.  Mild pursed lip breathing but overall nonfocal exam. Assessment:       ICD-10-CM   1. Pulmonary emphysema, unspecified emphysema type ([place])  J43.9   2. Chronic cough  R05   3. Vaccine counseling  Z71.89        Plan:     Patient Instructions     ICD-10-CM   1. Pulmonary emphysema, unspecified emphysema type ([place])  J43.9   2. Chronic cough  R05   3. Vaccine counseling  Z71.89     Please start spiriva respimat 2 puff daily -  take sample, script and show technique Use albuterol  Prn Do CT chest without contrast next 3-6 weeks Do full PFT next 3-6 weeks Glad smoking in remission Please have covid vaccine -CMA will give you the information.  Followup - 3-6 weeks but after CT and PFT     SIGNATURE    Dr. Brand Males, M.D., F.C.C.P,  Pulmonary and Critical Care Medicine Staff Physician, [place] Director - Interstitial Lung Disease  Program  Pulmonary [place] [facility] [place], [place], [address]  Pager: [phone], If no answer or between  15:00h - 7:00h: call [phone] Telephone: [phone]  4:26 PM 03/28/2020

## 2020-03-28 NOTE — Telephone Encounter (Signed)
Spoke with patient and provided ICM intro call.  Explained ICM and she agreed to monthly calls.  She currently has a cough and following up with pulmonary physician today.  She does not feel like she has any fluid at this time.  Advised will scheduled ICM remote transmission for 04/05/2020 and provided ICM direct number.  She confirms device monitor is at bedside and explained the remote transmission will be automatically sent if she is sleeping by monitor.   Encouraged to call if she experiences any fluid symptoms.

## 2020-03-28 NOTE — Addendum Note (Signed)
Addended by: Gavin Potters R on: 03/28/2020 04:45 PM   Modules accepted: Orders

## 2020-03-28 NOTE — Patient Instructions (Addendum)
ICD-10-CM   1. Pulmonary emphysema, unspecified emphysema type ([place])  J43.9   2. Chronic cough  R05   3. Vaccine counseling  Z71.89     Please start spiriva respimat 2 puff daily -  take sample, script and show technique Use albuterol  Prn Do CT chest without contrast next 3-6 weeks Do full PFT next 3-6 weeks Glad smoking in remission Please have covid vaccine -CMA will give you the information.  Followup - 3-6 weeks but after CT and PFT

## 2020-03-30 ENCOUNTER — Emergency Department (HOSPITAL_COMMUNITY)
Admission: EM | Admit: 2020-03-30 | Discharge: 2020-03-31 | Disposition: A | Discharge status: Home / Self Care | Payer: Medicare Other | Attending: Emergency Medicine | Admitting: Emergency Medicine

## 2020-03-30 ENCOUNTER — Encounter (HOSPITAL_COMMUNITY): Payer: Self-pay

## 2020-03-30 ENCOUNTER — Ambulatory Visit: Payer: Medicare Other

## 2020-03-30 ENCOUNTER — Emergency Department (HOSPITAL_COMMUNITY): Payer: Medicare Other

## 2020-03-30 DIAGNOSIS — Z79899 Other long term (current) drug therapy: Secondary | ICD-10-CM | POA: Insufficient documentation

## 2020-03-30 DIAGNOSIS — Z20822 Contact with and (suspected) exposure to covid-19: Secondary | ICD-10-CM | POA: Diagnosis not present

## 2020-03-30 DIAGNOSIS — J441 Chronic obstructive pulmonary disease with (acute) exacerbation: Secondary | ICD-10-CM | POA: Insufficient documentation

## 2020-03-30 DIAGNOSIS — R0789 Other chest pain: Secondary | ICD-10-CM | POA: Diagnosis not present

## 2020-03-30 DIAGNOSIS — Z87891 Personal history of nicotine dependence: Secondary | ICD-10-CM | POA: Diagnosis not present

## 2020-03-30 DIAGNOSIS — I110 Hypertensive heart disease with heart failure: Secondary | ICD-10-CM | POA: Diagnosis not present

## 2020-03-30 DIAGNOSIS — I509 Heart failure, unspecified: Secondary | ICD-10-CM | POA: Diagnosis not present

## 2020-03-30 DIAGNOSIS — Z7982 Long term (current) use of aspirin: Secondary | ICD-10-CM | POA: Diagnosis not present

## 2020-03-30 DIAGNOSIS — I2510 Atherosclerotic heart disease of native coronary artery without angina pectoris: Secondary | ICD-10-CM | POA: Diagnosis not present

## 2020-03-30 DIAGNOSIS — R000 Tachycardia, unspecified: Secondary | ICD-10-CM | POA: Insufficient documentation

## 2020-03-30 DIAGNOSIS — R0602 Shortness of breath: Secondary | ICD-10-CM | POA: Diagnosis present

## 2020-03-30 LAB — BASIC METABOLIC PANEL
Anion gap: 10 (ref 5–15)
BUN: 9 mg/dL (ref 8–23)
CO2: 19 mmol/L — ABNORMAL LOW (ref 22–32)
Calcium: 9.3 mg/dL (ref 8.9–10.3)
Chloride: 102 mmol/L (ref 98–111)
Creatinine, Ser: 0.74 mg/dL (ref 0.44–1.00)
GFR calc Af Amer: >60 mL/min (ref >60)
GFR calc non Af Amer: >60 mL/min (ref >60)
Glucose, Bld: 104 mg/dL — ABNORMAL HIGH (ref 70–99)
Potassium: 5 mmol/L (ref 3.5–5.1)
Sodium: 131 mmol/L — ABNORMAL LOW (ref 135–145)

## 2020-03-30 LAB — CBC
HCT: 41.3 % (ref 36.0–46.0)
Hemoglobin: 13.4 g/dL (ref 12.0–15.0)
MCH: 31.5 pg (ref 26.0–34.0)
MCHC: 32.4 g/dL (ref 30.0–36.0)
MCV: 97.2 fL (ref 80.0–100.0)
Platelets: 130 10*3/uL — ABNORMAL LOW (ref 150–400)
RBC: 4.25 MIL/uL (ref 3.87–5.11)
RDW: 12.8 % (ref 11.5–15.5)
WBC: 6.8 10*3/uL (ref 4.0–10.5)
nRBC: 0 % (ref 0.0–0.2)

## 2020-03-30 LAB — TROPONIN I (HIGH SENSITIVITY): Troponin I (High Sensitivity): 7 ng/L (ref <18)

## 2020-03-30 MED ORDER — SODIUM CHLORIDE 0.9% FLUSH
3.0000 mL | Freq: Once | INTRAVENOUS | Status: AC
  Administered 2020-03-31: 3 mL via INTRAVENOUS

## 2020-03-30 NOTE — ED Triage Notes (Signed)
Pt arrives to ED w/ c/o centrally located non-radiating 5/10 chest pain that hurts worse when she coughs. Pt c/o sob and cough that started today. Pt denies fever.

## 2020-03-31 DIAGNOSIS — J441 Chronic obstructive pulmonary disease with (acute) exacerbation: Secondary | ICD-10-CM | POA: Diagnosis not present

## 2020-03-31 LAB — TROPONIN I (HIGH SENSITIVITY): Troponin I (High Sensitivity): 10 ng/L (ref <18)

## 2020-03-31 LAB — SARS CORONAVIRUS 2 BY RT PCR (HOSPITAL ORDER, PERFORMED IN ~~LOC~~ HOSPITAL LAB): SARS Coronavirus 2: NEGATIVE

## 2020-03-31 MED ORDER — DOXYCYCLINE HYCLATE 100 MG PO TABS
100.0000 mg | ORAL_TABLET | Freq: Once | ORAL | Status: AC
  Administered 2020-03-31: 100 mg via ORAL
  Filled 2020-03-31: qty 1

## 2020-03-31 MED ORDER — IPRATROPIUM BROMIDE 0.02 % IN SOLN
0.5000 mg | Freq: Once | RESPIRATORY_TRACT | Status: DC
  Filled 2020-03-31: qty 2.5

## 2020-03-31 MED ORDER — DOXYCYCLINE HYCLATE 100 MG PO CAPS: 100.0000 mg | ORAL_CAPSULE | Freq: Two times a day (BID) | ORAL | 0 refills | Status: DC

## 2020-03-31 MED ORDER — ALBUTEROL SULFATE HFA 108 (90 BASE) MCG/ACT IN AERS
8.0000 | INHALATION_SPRAY | RESPIRATORY_TRACT | Status: DC | PRN
  Administered 2020-03-31: 8 via RESPIRATORY_TRACT
  Filled 2020-03-31: qty 6.7

## 2020-03-31 MED ORDER — IPRATROPIUM BROMIDE HFA 17 MCG/ACT IN AERS
4.0000 | INHALATION_SPRAY | Freq: Once | RESPIRATORY_TRACT | Status: AC
  Administered 2020-03-31: 4 via RESPIRATORY_TRACT
  Filled 2020-03-31: qty 12.9

## 2020-03-31 MED ORDER — METHYLPREDNISOLONE SODIUM SUCC 125 MG IJ SOLR
125.0000 mg | Freq: Once | INTRAMUSCULAR | Status: AC
  Administered 2020-03-31: 125 mg via INTRAVENOUS
  Filled 2020-03-31: qty 2

## 2020-03-31 MED ORDER — PREDNISONE 20 MG PO TABS
40.0000 mg | ORAL_TABLET | Freq: Every day | ORAL | 0 refills | Status: DC
Start: 2020-03-31 — End: 2020-08-15

## 2020-03-31 MED ORDER — ALBUTEROL SULFATE (2.5 MG/3ML) 0.083% IN NEBU
5.0000 mg | INHALATION_SOLUTION | Freq: Once | RESPIRATORY_TRACT | Status: AC
  Administered 2020-03-31: 5 mg via RESPIRATORY_TRACT
  Filled 2020-03-31: qty 6

## 2020-03-31 NOTE — ED Provider Notes (Signed)
[facility] EMERGENCY DEPARTMENT Provider Note   CSN: [id number] Arrival date & time: 03/30/20  2122     History Chief Complaint  Patient presents with  . Shortness of Breath    [name] is a 64 y.o. female.  Patient with history of heart failure status post ICD placement, coronary artery disease, COPD not on home oxygen --presents the emergency department with complaint of chest pain and shortness of breath. Symptoms became worse yesterday. Patient noted increasing cough productive of green sputum as opposed to clear sputum which she typically has with her cough. She has chest pain that is worse with coughing and is not constant. She denies fevers, sore throat or other URI symptoms. No nausea, vomiting, or diarrhea. She is able to lie flat without any difficulties. She denies increasing lower extremity swelling. She denies taking any fluid pills currently. No urinary symptoms. She has been taking home breathing medications without much improvement.  The onset of this condition was acute. The course is constant. Aggravating factors: coughing. Alleviating factors: none.          Past Medical History:  Diagnosis Date  . Acute on chronic systolic heart failure ([place])   . Acute respiratory failure ([place])   . Automatic implantable cardioverter-defibrillator in situ   . CAD (coronary artery disease)    a. Inf-post MI 2008 s/p BMS to large marginal of Cx.   Marland Kitchen COPD (chronic obstructive pulmonary disease) ([place])   . ETOH abuse   . Hyperglycemia   . HYPERLIPIDEMIA-MIXED 02/07/2009  . HYPERTENSION, BENIGN 08/22/2009  . PVD (peripheral vascular disease) ([place])    a. Evaluated by Dr. [name] Anon 08/2012.  Marland Kitchen SYSTOLIC HEART FAILURE, CHRONIC 02/07/2009   a. EF 30% 2010, 29% by MRI 08/2012. b. s/p prophylactic Medtronic ICD implantation 10/2012.  Marland Kitchen TIA (transient ischemic attack) 05/18/2014  . Tobacco abuse   . VENTRICULAR TACHYCARDIA 05/21/2010    Patient Active Problem List    Diagnosis Date Noted  . Abnormal CT scan, esophagus   . CAD (coronary artery disease)   . Acute respiratory failure ([place])   . Acute on chronic systolic heart failure ([place])   . Hyperglycemia   . COPD exacerbation ([place])   . CAP (community acquired pneumonia) 05/17/2016  . Essential hypertension   . COPD with acute exacerbation ([place]) 12/02/2014  . Systolic heart failure ([place]) 12/02/2014  . Dyspnea 12/02/2014  . Alcohol use ([place]) 12/02/2014  . Elevated hemoglobin ([place]) 12/02/2014  . Acute bronchitis 12/02/2014  . Acute respiratory failure with hypoxia ([place])   . Malnutrition of moderate degree ([place]) 05/19/2014  . Right facial numbness 05/18/2014  . Weakness of right lower extremity 05/18/2014  . TIA (transient ischemic attack) 05/18/2014  . 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  . Smoker 08/11/2012  . PVD (peripheral vascular disease) ([place]) 08/11/2012  . VENTRICULAR TACHYCARDIA 05/21/2010  . BEN HTN HEART DISEASE WITHOUT HEART FAIL 08/28/2009  . HYPERTENSION, BENIGN 08/22/2009  . CHEST PAIN-PRECORDIAL 07/18/2009  . CARDIOVASCULAR FUNCTION STUDY, ABNORMAL 07/18/2009  . Elevated lipids 02/07/2009  . CAD, NATIVE VESSEL 02/07/2009  . Nonischemic cardiomyopathy ([place]) 02/07/2009  . SYSTOLIC HEART FAILURE, CHRONIC 02/07/2009    Past Surgical History:  Procedure Laterality Date  . CARDIAC DEFIBRILLATOR PLACEMENT  10/2012  . CESAREAN SECTION  1977; 1982  . COLONOSCOPY    . CYSTOSCOPY W/ STONE MANIPULATION  1990's  . ESOPHAGOGASTRODUODENOSCOPY (EGD) WITH PROPOFOL N/A 08/04/2018   Procedure: ESOPHAGOGASTRODUODENOSCOPY (EGD) WITH PROPOFOL;  Surgeon: Gatha Mayer,  MD;  Location: [facility] ENDOSCOPY;  Service: Endoscopy;  Laterality: N/A;  . IMPLANTABLE CARDIOVERTER DEFIBRILLATOR IMPLANT N/A 10/21/2012   Procedure: IMPLANTABLE CARDIOVERTER DEFIBRILLATOR IMPLANT;  Surgeon: Evans Lance, MD;  Location: [facility] CATH LAB;  Service: Cardiovascular;  Laterality: N/A;  .  RIGHT/LEFT HEART CATH AND CORONARY ANGIOGRAPHY N/A 03/26/2018   Procedure: RIGHT/LEFT HEART CATH AND CORONARY ANGIOGRAPHY;  Surgeon: Nelva Bush, MD;  Location: [place] [facility] CV LAB;  Service: Cardiovascular;  Laterality: N/A;     OB History   No obstetric history on file.     Family History  Problem Relation Age of Onset  . Cancer Mother        cervical  . Heart attack Father   . Asthma Brother   . Other Brother        Pacemaker    Social History   Tobacco Use  . Smoking status: Former Smoker    Packs/day: 0.50    Years: 40.00    Pack years: 20.00    Types: Cigarettes    Quit date: 04/27/2018    Years since quitting: 1.9  . Smokeless tobacco: Never Used  . Tobacco comment: pt is on nicotine patches   Vaping Use  . Vaping Use: Never used  Substance Use Topics  . Alcohol use: Yes    Alcohol/week: 20.0 standard drinks    Types: 20 Cans of beer per week    Comment: 05/18/2014 "2, 40's qod"; 1 beer last week   . Drug use: No    Home Medications Prior to Admission medications   Medication Sig Start Date End Date Taking? Authorizing Provider  albuterol (PROAIR HFA) 108 (90 Base) MCG/ACT inhaler Inhale 2 puffs into the lungs every 6 (six) hours as needed for wheezing or shortness of breath. 03/28/20 04/27/20  Brand Males, MD  aspirin EC 81 MG tablet Take 81 mg by mouth daily.    [provider]  benzonatate (TESSALON) 100 MG capsule Take 1 capsule (100 mg total) by mouth 3 (three) times daily. 03/01/20   Martyn Ehrich, NP  carvedilol (COREG) 12.5 MG tablet Take 1 tablet (12.5 mg total) by mouth 2 (two) times daily with a meal. 05/25/19   [name], MD  ENTRESTO 97-103 MG TAKE 1 TABLET BY MOUTH TWICE A DAY 05/26/19   [name], MD  feeding supplement, ENSURE COMPLETE, (ENSURE COMPLETE) LIQD Take 237 mLs by mouth daily. 05/19/14   Cristal Ford, DO  folic acid (FOLVITE) 1 MG tablet Take 1 mg by mouth daily.  05/08/18   [provider]    nitroGLYCERIN (NITROSTAT) 0.4 MG SL tablet Place 1 tablet (0.4 mg total) under the tongue every 5 (five) minutes as needed for chest pain (up to 3 doses). 11/03/17   [name], MD  spironolactone (ALDACTONE) 25 MG tablet Take 0.5 tablets (12.5 mg total) by mouth daily. 03/28/18 07/29/19  Elodia Florence., MD    Allergies    Clopidogrel bisulfate  Review of Systems   Review of Systems  Constitutional: Negative for fever.  HENT: Negative for rhinorrhea and sore throat.   Eyes: Negative for redness.  Respiratory: Positive for cough, shortness of breath and wheezing.   Cardiovascular: Negative for chest pain.  Gastrointestinal: Negative for abdominal pain, diarrhea, nausea and vomiting.  Genitourinary: Negative for dysuria.  Musculoskeletal: Negative for myalgias.  Skin: Negative for rash.  Neurological: Negative for headaches.    Physical Exam Updated Vital Signs BP (!) 130/99 (BP Location: Right  Arm)   Pulse 104   Temp (!) 97.3 F (36.3 C) (Oral)   Resp 20   Ht 5\' 9"  (1.753 m)   Wt 81.6 kg   SpO2 98%   BMI 26.58 kg/m   Physical Exam Vitals and nursing note reviewed.  Constitutional:      Appearance: She is well-developed.  HENT:     Head: Normocephalic and atraumatic.  Eyes:     General:        Right eye: No discharge.        Left eye: No discharge.     Conjunctiva/sclera: Conjunctivae normal.  Cardiovascular:     Rate and Rhythm: Regular rhythm. Tachycardia present.     Heart sounds: Normal heart sounds.     Comments: Mild tachycardia Pulmonary:     Effort: Pulmonary effort is normal.     Breath sounds: Wheezing (scattered, exp wheezing) present. No decreased breath sounds (mild), rhonchi or rales.  Abdominal:     Palpations: Abdomen is soft.     Tenderness: There is no abdominal tenderness.  Musculoskeletal:     Cervical back: Normal range of motion and neck supple.     Right lower leg: No tenderness. No edema.     Left lower leg: No tenderness.  No edema.  Skin:    General: Skin is warm and dry.  Neurological:     Mental Status: She is alert.     ED Results / Procedures / Treatments   Labs (all labs ordered are listed, but only abnormal results are displayed) Labs Reviewed  BASIC METABOLIC PANEL - Abnormal; Notable for the following components:      Result Value   Sodium 131 (*)    CO2 19 (*)    Glucose, Bld 104 (*)    All other components within normal limits  CBC - Abnormal; Notable for the following components:   Platelets 130 (*)    All other components within normal limits  SARS CORONAVIRUS 2 BY RT PCR (HOSPITAL ORDER, [facility])  TROPONIN I (HIGH SENSITIVITY)  TROPONIN I (HIGH SENSITIVITY)    EKG EKG Interpretation  Date/Time:  Thursday March 30 2020 21:30:43 EDT Ventricular Rate:  93 PR Interval:  182 QRS Duration: 100 QT Interval:  360 QTC Calculation: 447 R Axis:   22 Text Interpretation: Normal sinus rhythm Low voltage QRS Cannot rule out Anterior infarct , age undetermined new T wave inversion Lateral leads T wave inversion Inferior leads RESOLVED SINCE PREVIOUS Confirmed by Blanchie Dessert [id number]) on 03/31/2020 8:54:11 AM   Radiology DG Chest 2 View  Result Date: 03/30/2020 CLINICAL DATA:  Chest pain EXAM: CHEST - 2 VIEW COMPARISON:  March 23, 2018 FINDINGS: There is a left-sided ICD in place. The heart size is mildly enlarged. There are bronchitic changes at the lung bases bilaterally. No pneumothorax or focal infiltrate. No significant pleural effusion. IMPRESSION: No active cardiopulmonary disease. Electronically Signed   By: Constance Holster M.D.   On: 03/30/2020 22:08    Procedures Procedures (including critical care time)  Medications Ordered in ED Medications  albuterol (VENTOLIN HFA) 108 (90 Base) MCG/ACT inhaler 8 puff (8 puffs Inhalation Given 03/31/20 0944)  albuterol (PROVENTIL) (2.5 MG/3ML) 0.083% nebulizer solution 5 mg (has no administration in time  range)  ipratropium (ATROVENT) nebulizer solution 0.5 mg (has no administration in time range)  sodium chloride flush (NS) 0.9 % injection 3 mL (3 mLs Intravenous Given 03/31/20 0945)  ipratropium (ATROVENT HFA) inhaler 4  puff (4 puffs Inhalation Given 03/31/20 0945)  methylPREDNISolone sodium succinate (SOLU-MEDROL) 125 mg/2 mL injection 125 mg (125 mg Intravenous Given 03/31/20 0945)    ED Course  I have reviewed the triage vital signs and the nursing notes.  Pertinent labs & imaging results that were available during my care of the patient were reviewed by me and considered in my medical decision making (see chart for details).  Patient seen and examined. Work-up initiated. Medications ordered.   Vital signs reviewed and are as follows: BP (!) 130/99 (BP Location: Right Arm)   Pulse 104   Temp (!) 97.3 F (36.3 C) (Oral)   Resp 20   Ht 5\' 9"  (1.753 m)   Wt 81.6 kg   SpO2 98%   BMI 26.58 kg/m   Overall, her symptoms are most suggestive of a COPD exacerbation with wheezing, coughing, sputum production. Her reported chest pain is not constant but occurs when she coughs. Troponin are normal x 2. EKG reviewed, t-wave changes. Doubt ACS at this point. Also she does not have compelling indications of CHF including swelling, edema on x-ray, JVD, orthopnea. Will treat for COPD exacerbation and reassess.  11:26 AM Patient reexamined.  She states she is feeling a lot better after the breathing medication.  She ambulated earlier with oxygen levels in the upper 80s, stating that she felt okay with walking.  Scant wheezing on exam -- improved from initial exam.  Will give additional nebulizer treatment now that her Covid test is negative.  Will likely discharge to home if she continues to feel well.  1:26 PM patient has received nebulized albuterol and Atrovent.  She continues to do well.  She is satting 96-97% on room air when I went to recheck her.  She is in no distress.  She is on the phone at  times, talking without difficulty.  Eating and drinking well.  Plan for discharged home with albuterol HFA.  Discussed use every 4 hours over the next 24 to 48 hours.  We will also give course of doxycycline and 5-day burst of prednisone to start tomorrow.  Encouraged PCP follow-up in the next 72 hours for recheck.  Patient urged to return with worsening symptoms or other concerns. Patient verbalized understanding and agrees with plan.      MDM Rules/Calculators/A&P                           Patient presents today with increased wheezing, cough, sputum production and chest pain.  As discussed above, low concern for ACS at this time.  Patient was treated for COPD exacerbation with improvement of symptoms.  Patient has been in the emergency department for 16 hours.  At time of discharge, she has normal oxygen saturation and is no respiratory distress.  She has minimal wheezing on exam.  No reported chest pain.  Doubt PE at this point given that chest pain occurs only with coughing and she has no clinical signs symptoms of DVT.  Feel comfortable discharged home with treatments as above.  Patient seems reliable to return if she gets worse.  Covid negative.   Final Clinical Impression(s) / ED Diagnoses Final diagnoses:  COPD exacerbation ([place])    Rx / DC Orders ED Discharge Orders         Ordered    doxycycline (VIBRAMYCIN) 100 MG capsule  2 times daily     Discontinue  Reprint     03/31/20 1322  predniSONE (DELTASONE) 20 MG tablet  Daily     Discontinue  Reprint     03/31/20 1322           Carlisle Cater, Hershal Coria 03/31/20 1328    Blanchie Dessert, MD 03/31/20 1446

## 2020-03-31 NOTE — ED Notes (Signed)
I walked patient in the hall and her stats were 87 to 32

## 2020-03-31 NOTE — Discharge Instructions (Signed)
Please read and follow all provided instructions.  Your diagnoses today include:  1. COPD exacerbation ([place])     Tests performed today include:  Chest x-ray - did not show pneumonia  Blood counts and electrolytes  COVID test - was negative  Vital signs. See below for your results today.   Medications prescribed:   Doxycycline - antibiotic  You have been prescribed an antibiotic medicine: take the entire course of medicine even if you are feeling better. Stopping early can cause the antibiotic not to work.   Prednisone - steroid medicine   It is best to take this medication in the morning to prevent sleeping problems. If you are diabetic, monitor your blood sugar closely and stop taking Prednisone if blood sugar is over 300. Take with food to prevent stomach upset.    Take any prescribed medications only as directed.  Home care instructions:  Follow any educational materials contained in this packet.  Follow-up instructions: Please follow-up with your primary care provider in the next 3 days for further evaluation of your symptoms and management of your COPD.   Return instructions:   Please return to the Emergency Department if you experience worsening symptoms.  Please return with worsening wheezing, shortness of breath, or difficulty breathing.  Return with persistent fever above 101F.   Please return if you have any other emergent concerns.  Additional Information:  Your vital signs today were: BP (!) 130/99 (BP Location: Right Arm)   Pulse 104   Temp (!) 97.3 F (36.3 C) (Oral)   Resp 20   Ht 5\' 9"  (1.753 m)   Wt 81.6 kg   SpO2 98%   BMI 26.58 kg/m  If your blood pressure (BP) was elevated above 135/85 this visit, please have this repeated by your doctor within one month. --------------

## 2020-03-31 NOTE — ED Notes (Signed)
Patient Alert and oriented to baseline. Stable and ambulatory to baseline. Patient verbalized understanding of the discharge instructions.  Patient belongings were taken by the patient.   

## 2020-04-01 ENCOUNTER — Ambulatory Visit: Payer: Medicare Other

## 2020-04-05 ENCOUNTER — Ambulatory Visit (INDEPENDENT_AMBULATORY_CARE_PROVIDER_SITE_OTHER): Payer: Medicare Other

## 2020-04-05 DIAGNOSIS — Z95810 Presence of automatic (implantable) cardiac defibrillator: Secondary | ICD-10-CM | POA: Diagnosis not present

## 2020-04-05 DIAGNOSIS — I5022 Chronic systolic (congestive) heart failure: Secondary | ICD-10-CM | POA: Diagnosis not present

## 2020-04-07 NOTE — Progress Notes (Signed)
EPIC Encounter for ICM Monitoring  Patient Name: [name] is a 64 y.o. female Date: 04/07/2020 Primary Care Physican: Vicenta Aly, [place] Primary Cardiologist: Johnsie Cancel Electrophysiologist: Lovena Le 03/28/2020 Weight: 180 lbs        1st ICM remote transmission. Heart Failure questions reviewed.  Pt asymptomatic.  ER visit 03/30/2020 for COPD exacerbation.   Optivol thoracic impedance normal.   Prescribed: Spironolactone 25 mg 1 tablet take 0.5 tablet (12.5mg  total) daily.  Recommendations:  No changes and encouraged to call if experiencing any fluid symptoms.  Follow-up plan: ICM clinic phone appointment on 05/08/2020.   91 day device clinic remote transmission 06/13/2020.    EP/Cardiology Office Visits: 06/06/2020 with Dr. Lovena Le.    Copy of ICM check sent to Dr. Lovena Le.   3 month ICM trend: 04/05/2020    1 Year ICM trend:       [name], RN 04/07/2020 4:32 PM

## 2020-04-10 ENCOUNTER — Other Ambulatory Visit: Payer: Self-pay

## 2020-04-10 ENCOUNTER — Ambulatory Visit
Admission: RE | Admit: 2020-04-10 | Discharge: 2020-04-10 | Disposition: A | Discharge status: Home / Self Care | Payer: Medicare (Managed Care) | Source: Ambulatory Visit | Attending: Internal Medicine | Admitting: Internal Medicine

## 2020-04-10 DIAGNOSIS — J439 Emphysema, unspecified: Secondary | ICD-10-CM

## 2020-04-10 DIAGNOSIS — Z7185 Encounter for immunization safety counseling: Secondary | ICD-10-CM

## 2020-04-10 DIAGNOSIS — R053 Chronic cough: Secondary | ICD-10-CM

## 2020-04-10 DIAGNOSIS — R05 Cough: Secondary | ICD-10-CM

## 2020-04-10 DIAGNOSIS — Z7189 Other specified counseling: Secondary | ICD-10-CM

## 2020-04-23 NOTE — Progress Notes (Signed)
Ct has emphysem and gerd. Having PFT end sept 2021. Plan - for now continue copd Rx. She should talk to PCP about gerd .Please give followup to see APP after PFT

## 2020-05-11 ENCOUNTER — Telehealth: Payer: Self-pay

## 2020-05-11 NOTE — Telephone Encounter (Signed)
    Pt returned call, advised she need to send transmission. Pt understood will be sending today

## 2020-05-11 NOTE — Telephone Encounter (Signed)
LMOVM for pt to send transmission with home monitor for ICM appointment.

## 2020-05-16 ENCOUNTER — Ambulatory Visit (INDEPENDENT_AMBULATORY_CARE_PROVIDER_SITE_OTHER): Payer: Medicare (Managed Care)

## 2020-05-16 DIAGNOSIS — I5022 Chronic systolic (congestive) heart failure: Secondary | ICD-10-CM | POA: Diagnosis not present

## 2020-05-16 DIAGNOSIS — Z95810 Presence of automatic (implantable) cardiac defibrillator: Secondary | ICD-10-CM | POA: Diagnosis not present

## 2020-05-17 NOTE — Progress Notes (Signed)
EPIC Encounter for ICM Monitoring  Patient Name: [name] is a 64 y.o. female Date: 05/17/2020 Primary Care Physican: Vicenta Aly, [place] Primary Cardiologist: Johnsie Cancel Electrophysiologist: Lovena Le Weight: does not weigh at home       1st ICM remote transmission. Heart Failure questions reviewed.  Pt asymptomatic for fluid accumulation.   Optivol thoracic impedance normal.   Recommendations:   Encouraged to call if experiencing fluid symptoms.  Follow-up plan: ICM clinic phone appointment on 06/19/2020.   91 day device clinic remote transmission 06/12/2020.    EP/Cardiology Office Visits: Advised to call office to schedule appointment with Dr Lovena Le and Dr Johnsie Cancel.  Recall 05/17/2020 with Dr Lovena Le.  01/31/2019 last cardiologist visit with Cecilie Kicks, NP  Copy of ICM check sent to Dr. Lovena Le.   3 month ICM trend: 05/11/2020    1 Year ICM trend:       [name], RN 05/17/2020 3:50 PM

## 2020-06-08 ENCOUNTER — Telehealth: Payer: Self-pay | Admitting: *Deleted

## 2020-06-08 ENCOUNTER — Other Ambulatory Visit: Payer: Self-pay | Admitting: Cardiovascular Disease

## 2020-06-08 NOTE — Telephone Encounter (Signed)
error 

## 2020-06-13 ENCOUNTER — Ambulatory Visit (INDEPENDENT_AMBULATORY_CARE_PROVIDER_SITE_OTHER): Payer: Medicare (Managed Care)

## 2020-06-13 DIAGNOSIS — I428 Other cardiomyopathies: Secondary | ICD-10-CM | POA: Diagnosis not present

## 2020-06-15 LAB — CUP PACEART REMOTE DEVICE CHECK
Battery Remaining Longevity: 47 mo
Battery Voltage: 2.97 V
Brady Statistic RV Percent Paced: 0.03 %
Date Time Interrogation Session: 20211006152425
HighPow Impedance: 80 Ohm
Implantable Lead Implant Date: 20140212
Implantable Lead Location: 753860
Implantable Lead Model: 6935
Implantable Pulse Generator Implant Date: 20140212
Lead Channel Impedance Value: 532 Ohm
Lead Channel Impedance Value: 589 Ohm
Lead Channel Pacing Threshold Amplitude: 0.5 V
Lead Channel Pacing Threshold Pulse Width: 0.4 ms
Lead Channel Sensing Intrinsic Amplitude: 7.75 mV
Lead Channel Sensing Intrinsic Amplitude: 7.75 mV
Lead Channel Setting Pacing Amplitude: 2.5 V
Lead Channel Setting Pacing Pulse Width: 0.4 ms
Lead Channel Setting Sensing Sensitivity: 0.3 mV

## 2020-06-16 NOTE — Progress Notes (Signed)
Remote ICD transmission.   

## 2020-06-19 ENCOUNTER — Ambulatory Visit (INDEPENDENT_AMBULATORY_CARE_PROVIDER_SITE_OTHER): Payer: Medicare (Managed Care)

## 2020-06-19 DIAGNOSIS — I5022 Chronic systolic (congestive) heart failure: Secondary | ICD-10-CM | POA: Diagnosis not present

## 2020-06-19 DIAGNOSIS — Z95810 Presence of automatic (implantable) cardiac defibrillator: Secondary | ICD-10-CM | POA: Diagnosis not present

## 2020-06-20 NOTE — Progress Notes (Signed)
EPIC Encounter for ICM Monitoring  Patient Name: [name] is a 64 y.o. female Date: 06/20/2020 Primary Care Physican: Vicenta Aly, [place] Primary Cardiologist: Johnsie Cancel Electrophysiologist: Lovena Le Weight: does not weigh at home                                                           Transmission reviewed   Optivol thoracic impedance normal.   Recommendations:   No changes  Follow-up plan: ICM clinic phone appointment on 07/25/2020.   91 day device clinic remote transmission 09/15/2020.    EP/Cardiology Office Visits: 08/15/2020 with Dr Johnsie Cancel.  09/12/2020 with Dr Lovena Le.   Copy of ICM check sent to Dr. Lovena Le.    3 month ICM trend: 06/14/2020    1 Year ICM trend:       [name], RN 06/20/2020 1:46 PM

## 2020-07-28 ENCOUNTER — Telehealth: Payer: Self-pay

## 2020-07-28 NOTE — Telephone Encounter (Signed)
LMOVM for patient to send manual transmission for HF.

## 2020-08-02 NOTE — Progress Notes (Signed)
No ICM remote transmission received for 07/25/2020 and next ICM transmission scheduled for 08/15/2020.

## 2020-08-07 ENCOUNTER — Ambulatory Visit (INDEPENDENT_AMBULATORY_CARE_PROVIDER_SITE_OTHER): Payer: Medicare (Managed Care)

## 2020-08-07 DIAGNOSIS — Z95810 Presence of automatic (implantable) cardiac defibrillator: Secondary | ICD-10-CM

## 2020-08-07 DIAGNOSIS — I5022 Chronic systolic (congestive) heart failure: Secondary | ICD-10-CM | POA: Diagnosis not present

## 2020-08-11 NOTE — Progress Notes (Signed)
We attempted video but she could not do at this time  Date:  08/15/2020   ID:  [name], DOB 01-Jan-1956, MRN [id number]   PCP:  Vicenta Aly, FNP  Cardiologist:  Jenkins Rouge, MD  Electrophysiologist:  Cristopher Peru, MD   Evaluation Performed:  Follow-Up Visit  Chief Complaint:  CAD  History of Present Illness:     64 y.o. 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 Known PVD with bilateral SFA occlusions July 2019 has relapse with EF 20-25% cath with patent stent and mild dx elsewhere There seems to be and issue with ETOH abuse   Seen by pulmonary 06/08/18 Gold stage 2 and on Spiriva now  CT July 2019 showed esophagitis referred to GI    Echo 01/20/19 with EF up to 40-45% from 20-25% with moderate AR - to have a follow up echo in 1 year.  Remote device check 12/15/18 with normal.  ABIs done 11/2018 with mild decrease, plan to monitor.  No claudication   Needs refill on aldactone    Past Medical History:  Diagnosis Date  . Acute on chronic systolic heart failure ([place])   . Acute respiratory failure ([place])   . Automatic implantable cardioverter-defibrillator in situ   . CAD (coronary artery disease)    a. Inf-post MI 2008 s/p BMS to large marginal of Cx.   Marland Kitchen COPD (chronic obstructive pulmonary disease) ([place])   . ETOH abuse   . Hyperglycemia   . HYPERLIPIDEMIA-MIXED 02/07/2009  . HYPERTENSION, BENIGN 08/22/2009  . PVD (peripheral vascular disease) ([place])    a. Evaluated by Dr. [name] Anon 08/2012.  Marland Kitchen SYSTOLIC HEART FAILURE, CHRONIC 02/07/2009   a. EF 30% 2010, 29% by MRI 08/2012. b. s/p prophylactic Medtronic ICD implantation 10/2012.  Marland Kitchen TIA (transient ischemic attack) 05/18/2014  . Tobacco abuse   . VENTRICULAR TACHYCARDIA 05/21/2010   Past Surgical History:  Procedure Laterality Date  . CARDIAC DEFIBRILLATOR PLACEMENT  10/2012  . CESAREAN SECTION  1977; 1982  . COLONOSCOPY    .  CYSTOSCOPY W/ STONE MANIPULATION  1990's  . ESOPHAGOGASTRODUODENOSCOPY (EGD) WITH PROPOFOL N/A 08/04/2018   Procedure: ESOPHAGOGASTRODUODENOSCOPY (EGD) WITH PROPOFOL;  Surgeon: Gatha Mayer, MD;  Location: [facility];  Service: Endoscopy;  Laterality: N/A;  . IMPLANTABLE CARDIOVERTER DEFIBRILLATOR IMPLANT N/A 10/21/2012   Procedure: IMPLANTABLE CARDIOVERTER DEFIBRILLATOR IMPLANT;  Surgeon: Evans Lance, MD;  Location: [facility] CATH LAB;  Service: Cardiovascular;  Laterality: N/A;  . RIGHT/LEFT HEART CATH AND CORONARY ANGIOGRAPHY N/A 03/26/2018   Procedure: RIGHT/LEFT HEART CATH AND CORONARY ANGIOGRAPHY;  Surgeon: Nelva Bush, MD;  Location: [place] CV LAB;  Service: Cardiovascular;  Laterality: N/A;     Current Meds  Medication Sig  . albuterol (PROAIR HFA) 108 (90 Base) MCG/ACT inhaler Inhale 2 puffs into the lungs every 6 (six) hours as needed for wheezing or shortness of breath.  Marland Kitchen aspirin EC 81 MG tablet Take 81 mg by mouth daily.  . carvedilol (COREG) 12.5 MG tablet Take 1 tablet (12.5 mg total) by mouth 2 (two) times daily with a meal.  . ENTRESTO 97-103 MG TAKE 1 TABLET BY MOUTH TWICE A DAY  . feeding supplement, ENSURE COMPLETE, (ENSURE COMPLETE) LIQD Take 237 mLs by mouth daily. (Patient taking differently: Take 237 mLs by mouth every other day. )  . folic acid (FOLVITE) 1 MG tablet Take 1 mg by mouth daily.   Marland Kitchen  nitroGLYCERIN (NITROSTAT) 0.4 MG SL tablet Place 1 tablet (0.4 mg total) under the tongue every 5 (five) minutes as needed for chest pain (up to 3 doses).  Marland Kitchen spironolactone (ALDACTONE) 25 MG tablet Take 0.5 tablets (12.5 mg total) by mouth daily.     Allergies:   Clopidogrel bisulfate   Social History   Tobacco Use  . Smoking status: Former Smoker    Packs/day: 0.50    Years: 40.00    Pack years: 20.00    Types: Cigarettes    Quit date: 04/27/2018    Years since quitting: 2.3  . Smokeless tobacco: Never Used  . Tobacco comment: pt is on nicotine patches    Vaping Use  . Vaping Use: Never used  Substance Use Topics  . Alcohol use: Yes    Alcohol/week: 20.0 standard drinks    Types: 20 Cans of beer per week    Comment: 05/18/2014 "2, 40's qod"; 1 beer last week   . Drug use: No     Family Hx: The patient's family history includes Asthma in her brother; Cancer in her mother; Heart attack in her father; Other in her brother.  ROS:   Please see the history of present illness.    General:no colds or fevers, + weight loss, she put herself on a diet Skin:no rashes or ulcers HEENT:no blurred vision, no congestion CV:see HPI PUL:see HPI GI:no diarrhea constipation or melena, no indigestion GU:no hematuria, no dysuria MS:no joint pain, no claudication Neuro:no syncope, no lightheadedness Endo:no diabetes, no thyroid disease  All other systems reviewed and are negative.   Prior CV studies:   The following studies were reviewed today:  Echo 01/20/19 IMPRESSIONS    1. Severe hypokinesis of the left ventricular, basal-mid inferolateral wall, anterolateral wall and inferior wall.  2. The left ventricle has mild-moderately reduced systolic function, with an ejection fraction of 40-45%. The cavity size was mildly dilated. Left ventricular diastolic Doppler parameters are consistent with impaired relaxation. Elevated mean left  atrial pressure.  3. The right ventricle has normal systolic function. The cavity was normal. There is no increase in right ventricular wall thickness.  4. The aortic valve is tricuspid. Aortic valve regurgitation is moderate by color flow Doppler.  FINDINGS  Left Ventricle: The left ventricle has mild-moderately reduced systolic function, with an ejection fraction of 40-45%. The cavity size was mildly dilated. There is no increase in left ventricular wall thickness. Left ventricular diastolic Doppler  parameters are consistent with impaired relaxation. Elevated mean left atrial pressure Severe hypokinesis of the left  ventricular, basal-mid inferolateral wall, anterolateral wall and inferior wall.  Right Ventricle: The right ventricle has normal systolic function. The cavity was normal. There is no increase in right ventricular wall thickness. Pacing wire/catheter visualized in the right ventricle.  Left Atrium: Left atrial size was normal in size.  Right Atrium: Right atrial size was normal in size. Right atrial pressure is estimated at 10 mmHg.  Interatrial Septum: No atrial level shunt detected by color flow Doppler.  Pericardium: There is no evidence of pericardial effusion.  Mitral Valve: The mitral valve is normal in structure. Mitral valve regurgitation is mild by color flow Doppler.  Tricuspid Valve: The tricuspid valve is normal in structure. Tricuspid valve regurgitation is trivial by color flow Doppler.  Aortic Valve: The aortic valve is tricuspid Aortic valve regurgitation is moderate by color flow Doppler.  Pulmonic Valve: The pulmonic valve was normal in structure. Pulmonic valve regurgitation is trivial by color flow  Doppler.  Venous: The inferior vena cava is normal in size with greater than 50% respiratory variability.      Labs/Other Tests and Data Reviewed:    EKG:  An ECG dated 01/03/19 was personally reviewed today and demonstrated:  SR with Q waves III, AVF, deep T wave V5-6  Recent Labs: 03/30/2020: BUN 9; Creatinine, Ser 0.74; Hemoglobin 13.4; Platelets 130; Potassium 5.0; Sodium 131   Recent Lipid Panel Lab Results  Component Value Date/Time   CHOL 130 03/23/2018 09:00 AM   CHOL 153 02/24/2017 11:54 AM   TRIG 66 03/23/2018 09:00 AM   HDL 39 (L) 03/23/2018 09:00 AM   HDL 74 02/24/2017 11:54 AM   CHOLHDL 3.3 03/23/2018 09:00 AM   LDLCALC 78 03/23/2018 09:00 AM   LDLCALC 67 02/24/2017 11:54 AM    Wt Readings from Last 3 Encounters:  08/15/20 79.8 kg  03/30/20 81.6 kg  03/28/20 81.8 kg     Objective:    Vital Signs:  BP 90/60   Pulse 78   Ht 5'  9.5" (1.765 m)   Wt 79.8 kg   SpO2 93%   BMI 25.62 kg/m    Affect appropriate Chronically ill black female  HEENT: normal Neck supple with no adenopathy JVP normal no bruits no thyromegaly Lungs clear with no wheezing and good diaphragmatic motion Heart:  S1/S2 no murmur, no rub, gallop or click PMI increased AICD under left clavicle  Abdomen: benighn, BS positve, no tenderness, no AAA no bruit.  No HSM or HJR Distal pulses intact with no bruits No edema Neuro non-focal Skin warm and dry No muscular weakness   ASSESSMENT & PLAN:    1. NICM with EF to 25-30% on echo, now EF improved to 40-45% she continues on Coreg, entresto and aldactone, Discussed ETOH use she indicates only having a glass of red wine " on occasion"   2. AICD:  No shocks normal function f/u Taylor   3. CAD patent stent in OM no residual dx cath 03/26/18 continue medical Rx  4. HLD on statin labs with primary   5. HTN Well controlled.  Continue current medications and low sodium Dash type diet.    6. Tobacco use, has stopped  + COPD and followed by Pulmonary CT 04/10/20 with moderate emphysema diffuse bronchial thickening no cancer    COVID-19 Education: The signs and symptoms of COVID-19 were discussed with the patient and how to seek care for testing (follow up with PCP or arrange E-visit).  The importance of social distancing was discussed today.  Time:   Today, I have spent 15 minutes with the patient with telehealth technology discussing the above problems.     Medication Adjustments/Labs and Tests Ordered: Current medicines are reviewed at length with the patient today.  Concerns regarding medicines are outlined above.   Tests Ordered: No orders of the defined types were placed in this encounter.   Medication Changes: No orders of the defined types were placed in this encounter.   Disposition:  Follow up in a year   Signed, Jenkins Rouge, MD  08/15/2020 3:04 PM    [place]

## 2020-08-11 NOTE — Progress Notes (Signed)
EPIC Encounter for ICM Monitoring  Patient Name: [name] is a 64 y.o. female Date: 08/11/2020 Primary Care Physican: Vicenta Aly, [place] Primary Cardiologist:Nishan Electrophysiologist:Taylor Weight:does not weigh at home   Spoke with patient and reports feeling well at this time.  Denies fluid symptoms.    Optivol thoracic impedance normal.   Recommendations:No changes and encouraged to call if experiencing any fluid symptoms.  Follow-up plan: ICM clinic phone appointment on1/06/2021. 91 day device clinic remote transmission 09/15/2020.   EP/Cardiology Office Visits: 08/15/2020 with Dr Johnsie Cancel. 09/12/2020 with Dr Lovena Le.   Copy of ICM check sent to Dr.Taylor  3 month ICM trend: 08/07/2020    1 Year ICM trend:       [name], RN 08/11/2020 1:19 PM

## 2020-08-15 ENCOUNTER — Ambulatory Visit (INDEPENDENT_AMBULATORY_CARE_PROVIDER_SITE_OTHER): Payer: Medicare (Managed Care) | Admitting: Cardiovascular Disease

## 2020-08-15 ENCOUNTER — Other Ambulatory Visit: Payer: Self-pay

## 2020-08-15 VITALS — BP 90/60 | HR 78 | Ht 69.5 in | Wt 176.0 lb

## 2020-08-15 DIAGNOSIS — Z95810 Presence of automatic (implantable) cardiac defibrillator: Secondary | ICD-10-CM

## 2020-08-15 DIAGNOSIS — I420 Dilated cardiomyopathy: Secondary | ICD-10-CM | POA: Diagnosis not present

## 2020-08-15 MED ORDER — SPIRONOLACTONE 25 MG PO TABS: 12.5000 mg | ORAL_TABLET | Freq: Every day | ORAL | 3 refills | Status: DC

## 2020-08-15 NOTE — Patient Instructions (Addendum)

## 2020-08-17 ENCOUNTER — Other Ambulatory Visit: Payer: Self-pay | Admitting: Cardiovascular Disease

## 2020-09-12 ENCOUNTER — Encounter: Payer: Medicare (Managed Care) | Admitting: Internal Medicine

## 2020-09-15 ENCOUNTER — Ambulatory Visit (INDEPENDENT_AMBULATORY_CARE_PROVIDER_SITE_OTHER): Payer: Medicare Other

## 2020-09-15 DIAGNOSIS — I420 Dilated cardiomyopathy: Secondary | ICD-10-CM | POA: Diagnosis not present

## 2020-09-16 LAB — CUP PACEART REMOTE DEVICE CHECK
Battery Remaining Longevity: 44 mo
Battery Voltage: 2.97 V
Brady Statistic RV Percent Paced: 0.02 %
Date Time Interrogation Session: 20220107142454
HighPow Impedance: 73 Ohm
Implantable Lead Implant Date: 20140212
Implantable Lead Location: 753860
Implantable Lead Model: 6935
Implantable Pulse Generator Implant Date: 20140212
Lead Channel Impedance Value: 437 Ohm
Lead Channel Impedance Value: 513 Ohm
Lead Channel Pacing Threshold Amplitude: 0.5 V
Lead Channel Pacing Threshold Pulse Width: 0.4 ms
Lead Channel Sensing Intrinsic Amplitude: 8.625 mV
Lead Channel Sensing Intrinsic Amplitude: 8.625 mV
Lead Channel Setting Pacing Amplitude: 2.5 V
Lead Channel Setting Pacing Pulse Width: 0.4 ms
Lead Channel Setting Sensing Sensitivity: 0.3 mV

## 2020-09-18 ENCOUNTER — Ambulatory Visit (INDEPENDENT_AMBULATORY_CARE_PROVIDER_SITE_OTHER): Payer: Medicare Other

## 2020-09-18 DIAGNOSIS — I5022 Chronic systolic (congestive) heart failure: Secondary | ICD-10-CM

## 2020-09-18 DIAGNOSIS — Z95810 Presence of automatic (implantable) cardiac defibrillator: Secondary | ICD-10-CM | POA: Diagnosis not present

## 2020-09-18 NOTE — Progress Notes (Signed)
EPIC Encounter for ICM Monitoring  Patient Name: [name] is a 65 y.o. female Date: 09/18/2020 Primary Care Physican: Vicenta Aly, [place] Primary Cardiologist:Nishan Electrophysiologist:Taylor Weight:does not weigh at home   Spoke with patient and reports feeling well at this time.  Denies fluid symptoms.    Optivol thoracic impedance normal.   Recommendations:No changes and encouraged to call if experiencing any fluid symptoms.  Follow-up plan: ICM clinic phone appointment on2/14/2022. 91 day device clinic remote transmission4/04/2021.   EP/Cardiology Office Visits: 2/22/2022with Dr Lovena Le.   Copy of ICM check sent to Dr.Taylor  3 month ICM trend: 09/15/2020.    1 Year ICM trend:       [name], RN 09/18/2020 4:00 PM

## 2020-09-19 ENCOUNTER — Telehealth: Payer: Self-pay

## 2020-09-19 NOTE — Telephone Encounter (Signed)
Returned patient call and advised to get a handicapp sticker from Dr Lovena Le that she will need an appointment since the [place] has been more than a year ago.  She has an appointment with him in Feb and advised to discuss at that time.

## 2020-09-29 NOTE — Progress Notes (Signed)
Remote ICD transmission.   

## 2020-10-23 ENCOUNTER — Ambulatory Visit (INDEPENDENT_AMBULATORY_CARE_PROVIDER_SITE_OTHER): Payer: Medicare Other

## 2020-10-23 DIAGNOSIS — Z95810 Presence of automatic (implantable) cardiac defibrillator: Secondary | ICD-10-CM

## 2020-10-23 DIAGNOSIS — I5022 Chronic systolic (congestive) heart failure: Secondary | ICD-10-CM

## 2020-10-25 ENCOUNTER — Telehealth: Payer: Self-pay

## 2020-10-25 NOTE — Telephone Encounter (Signed)
The patient agreed to send the transmission later today.

## 2020-10-31 ENCOUNTER — Encounter: Payer: Self-pay | Admitting: Internal Medicine

## 2020-10-31 ENCOUNTER — Other Ambulatory Visit: Payer: Self-pay

## 2020-10-31 ENCOUNTER — Ambulatory Visit (INDEPENDENT_AMBULATORY_CARE_PROVIDER_SITE_OTHER): Payer: Medicare Other | Admitting: Internal Medicine

## 2020-10-31 VITALS — BP 96/60 | HR 70 | Ht 69.5 in | Wt 178.2 lb

## 2020-10-31 DIAGNOSIS — Z95810 Presence of automatic (implantable) cardiac defibrillator: Secondary | ICD-10-CM

## 2020-10-31 DIAGNOSIS — I4729 Other ventricular tachycardia: Secondary | ICD-10-CM

## 2020-10-31 DIAGNOSIS — I472 Ventricular tachycardia, unspecified: Secondary | ICD-10-CM

## 2020-10-31 DIAGNOSIS — I5022 Chronic systolic (congestive) heart failure: Secondary | ICD-10-CM

## 2020-10-31 NOTE — Patient Instructions (Addendum)
Medication Instructions:  Your physician recommends that you continue on your current medications as directed. Please refer to the Current Medication list given to you today.  Labwork: You will get a BMP today  Testing/Procedures: None ordered.  Follow-Up: Your physician wants you to follow-up in: one year with Cristopher Peru, MD or one of the following Advanced Practice Providers on your designated Care Team:    Chanetta Marshall, NP  Tommye Standard, PA-C  Legrand Como "Jonni Sanger" [place], [place]  Remote monitoring is used to monitor your ICD from home. This monitoring reduces the number of office visits required to check your device to one time per year. It allows [place] to keep an eye on the functioning of your device to ensure it is working properly. You are scheduled for a device check from home on 12/15/2020. You may send your transmission at any time that day. If you have a wireless device, the transmission will be sent automatically. After your physician reviews your transmission, you will receive a postcard with your next transmission date.  Any Other Special Instructions Will Be Listed Below (If Applicable).  If you need a refill on your cardiac medications before your next appointment, please call your pharmacy.

## 2020-10-31 NOTE — Progress Notes (Signed)
HPI [name] returns today for followup. She is a pleasant 65yo woman with a h/o CAD, VT, chronic systolic heart failure, s/p ICD implant. She has done well in the interim. She denies chest pain, sob, or syncope. No ICD shock. She has been reducing her tobacco use and is now tobacco free! Her weight is essentially unchanged.  Allergies  Allergen Reactions  . Clopidogrel Bisulfate Rash     Current Outpatient Medications  Medication Sig Dispense Refill  . albuterol (PROAIR HFA) 108 (90 Base) MCG/ACT inhaler Inhale 2 puffs into the lungs every 6 (six) hours as needed for wheezing or shortness of breath. 18 g 5  . aspirin EC 81 MG tablet Take 81 mg by mouth daily.    . carvedilol (COREG) 12.5 MG tablet Take 1 tablet (12.5 mg total) by mouth 2 (two) times daily with a meal. 180 tablet 3  . ENTRESTO 97-103 MG TAKE 1 TABLET BY MOUTH TWICE A DAY 180 tablet 3  . feeding supplement, ENSURE COMPLETE, (ENSURE COMPLETE) LIQD Take 237 mLs by mouth daily.    . folic acid (FOLVITE) 1 MG tablet Take 1 mg by mouth daily.   1  . nitroGLYCERIN (NITROSTAT) 0.4 MG SL tablet Place 1 tablet (0.4 mg total) under the tongue every 5 (five) minutes as needed for chest pain (up to 3 doses). 25 tablet 4  . spironolactone (ALDACTONE) 25 MG tablet Take 0.5 tablets (12.5 mg total) by mouth daily. 45 tablet 3   No current facility-administered medications for this visit.     Past Medical History:  Diagnosis Date  . Acute on chronic systolic heart failure ([place])   . Acute respiratory failure ([place])   . Automatic implantable cardioverter-defibrillator in situ   . CAD (coronary artery disease)    a. Inf-post MI 2008 s/p BMS to large marginal of Cx.   Marland Kitchen COPD (chronic obstructive pulmonary disease) ([place])   . ETOH abuse   . Hyperglycemia   . HYPERLIPIDEMIA-MIXED 02/07/2009  . HYPERTENSION, BENIGN 08/22/2009  . PVD (peripheral vascular disease) ([place])    a. Evaluated by Dr. [name] Anon 08/2012.  Marland Kitchen SYSTOLIC HEART  FAILURE, CHRONIC 02/07/2009   a. EF 30% 2010, 29% by MRI 08/2012. b. s/p prophylactic Medtronic ICD implantation 10/2012.  Marland Kitchen TIA (transient ischemic attack) 05/18/2014  . Tobacco abuse   . VENTRICULAR TACHYCARDIA 05/21/2010    ROS:   All systems reviewed and negative except as noted in the HPI.   Past Surgical History:  Procedure Laterality Date  . CARDIAC DEFIBRILLATOR PLACEMENT  10/2012  . CESAREAN SECTION  1977; 1982  . COLONOSCOPY    . CYSTOSCOPY W/ STONE MANIPULATION  1990's  . ESOPHAGOGASTRODUODENOSCOPY (EGD) WITH PROPOFOL N/A 08/04/2018   Procedure: ESOPHAGOGASTRODUODENOSCOPY (EGD) WITH PROPOFOL;  Surgeon: Gatha Mayer, MD;  Location: [facility];  Service: Endoscopy;  Laterality: N/A;  . IMPLANTABLE CARDIOVERTER DEFIBRILLATOR IMPLANT N/A 10/21/2012   Procedure: IMPLANTABLE CARDIOVERTER DEFIBRILLATOR IMPLANT;  Surgeon: Evans Lance, MD;  Location: [facility] CATH LAB;  Service: Cardiovascular;  Laterality: N/A;  . RIGHT/LEFT HEART CATH AND CORONARY ANGIOGRAPHY N/A 03/26/2018   Procedure: RIGHT/LEFT HEART CATH AND CORONARY ANGIOGRAPHY;  Surgeon: Nelva Bush, MD;  Location: [facility] CV LAB;  Service: Cardiovascular;  Laterality: N/A;     Family History  Problem Relation Age of Onset  . Cancer Mother        cervical  . Heart attack Father   . Asthma Brother   . Other Brother  Pacemaker     Social History   Socioeconomic History  . Marital status: Single    Spouse name: Not on file  . Number of children: Not on file  . Years of education: Not on file  . Highest education level: Not on file  Occupational History  . Not on file  Tobacco Use  . Smoking status: Former Smoker    Packs/day: 0.50    Years: 40.00    Pack years: 20.00    Types: Cigarettes    Quit date: 04/27/2018    Years since quitting: 2.5  . Smokeless tobacco: Never Used  . Tobacco comment: pt is on nicotine patches   Vaping Use  . Vaping Use: Never used  Substance and Sexual Activity  .  Alcohol use: Yes    Alcohol/week: 20.0 standard drinks    Types: 20 Cans of beer per week    Comment: 05/18/2014 "2, 40's qod"; 1 beer last week   . Drug use: No  . Sexual activity: Yes  Other Topics Concern  . Not on file  Social History Narrative   Single - disabled - last job at [employer]   + alcohol no smoking no substances   Social Determinants of Radio broadcast assistant Strain: Not on file  Food Insecurity: Not on file  Transportation Needs: Not on file  Physical Activity: Not on file  Stress: Not on file  Social Connections: Not on file  Intimate Partner Violence: Not on file     BP 96/60   Pulse 70   Ht 5' 9.5" (1.765 m)   Wt 178 lb 3.2 oz (80.8 kg)   SpO2 96%   BMI 25.94 kg/m   Physical Exam:  Well appearing NAD HEENT: Unremarkable Neck:  No JVD, no thyromegally Lymphatics:  No adenopathy Back:  No CVA tenderness Lungs:  Clear HEART:  Regular rate rhythm, no murmurs, no rubs, no clicks Abd:  soft, positive bowel sounds, no organomegally, no rebound, no guarding Ext:  2 plus pulses, no edema, no cyanosis, no clubbing Skin:  No rashes no nodules Neuro:  CN II through XII intact, motor grossly intact  EKG - NSR  DEVICE  Normal device function.  See PaceArt for details.   Assess/Plan:  1. Chronic systolic heart failure - her symptoms are class 2. She is encouraged to continue her current meds and maintain a low sodium diet. 2. ICD - her medtronic device is working normally. 3. VT - she has had no additional sustained VT since her last visit.  4. Tobacco abuse - she is in remission. I encouraged her to continue to stop smoking.

## 2020-10-31 NOTE — Progress Notes (Signed)
EPIC Encounter for ICM Monitoring  Patient Name: [name] is a 65 y.o. female Date: 10/31/2020 Primary Care Physican: Vicenta Aly, [place] Primary Cardiologist:Nishan Electrophysiologist:Taylor Weight:does not weigh at home   Transmission reviewed.  Optivol thoracic impedance trending slightly below baseline normal.   Recommendations:No changes.  Follow-up plan: ICM clinic phone appointment on3/21/2022. 91 day device clinic remote transmission4/04/2021.   EP/Cardiology Office Visits: 2/22/2022with Dr Lovena Le.   Copy of ICM check sent to Dr.Taylor   3 month ICM trend: 10/25/2020.    1 Year ICM trend:       [name], RN 10/31/2020 11:23 AM

## 2020-11-01 ENCOUNTER — Telehealth: Payer: Self-pay | Admitting: Internal Medicine

## 2020-11-01 DIAGNOSIS — I5022 Chronic systolic (congestive) heart failure: Secondary | ICD-10-CM

## 2020-11-01 DIAGNOSIS — I420 Dilated cardiomyopathy: Secondary | ICD-10-CM

## 2020-11-01 LAB — BASIC METABOLIC PANEL
BUN/Creatinine Ratio: 8 — ABNORMAL LOW (ref 12–28)
BUN: 11 mg/dL (ref 8–27)
CO2: 19 mmol/L — ABNORMAL LOW (ref 20–29)
Calcium: 9.7 mg/dL (ref 8.7–10.3)
Chloride: 104 mmol/L (ref 96–106)
Creatinine, Ser: 1.33 mg/dL — ABNORMAL HIGH (ref 0.57–1.00)
GFR calc Af Amer: 49 mL/min/{1.73_m2} — ABNORMAL LOW (ref >59)
GFR calc non Af Amer: 43 mL/min/{1.73_m2} — ABNORMAL LOW (ref >59)
Glucose: 110 mg/dL — ABNORMAL HIGH (ref 65–99)
Potassium: 4.7 mmol/L (ref 3.5–5.2)
Sodium: 142 mmol/L (ref 134–144)

## 2020-11-01 NOTE — Telephone Encounter (Signed)
Pt requesting handicap sticker.  Will discuss with GT and return call on 11/06/20

## 2020-11-01 NOTE — Telephone Encounter (Signed)
   Pt is calling to follow up paperwork for her handicap sticker

## 2020-11-06 NOTE — Telephone Encounter (Signed)
Patient following up.

## 2020-11-06 NOTE — Telephone Encounter (Signed)
Advised Dr. Lovena Le is ok to sign handicap sticker.  Advised would be ready for pick up tomorrow.  Pt requested this nurse call when ready.  Advised per recent lab work she should stop aldactone and repeat labs in a couple of weeks.  Pt indicates understanding.  Will schedule lab work tomorrow when she picks up handicap letter.

## 2020-11-07 NOTE — Telephone Encounter (Signed)
Pt advised to pick up handicap sticker and follow up labs scheduled.

## 2020-11-15 ENCOUNTER — Other Ambulatory Visit: Payer: Medicare Other

## 2020-11-22 ENCOUNTER — Other Ambulatory Visit: Payer: Medicare Other | Admitting: *Deleted

## 2020-11-22 ENCOUNTER — Other Ambulatory Visit: Payer: Self-pay

## 2020-11-22 DIAGNOSIS — I5022 Chronic systolic (congestive) heart failure: Secondary | ICD-10-CM

## 2020-11-22 DIAGNOSIS — I420 Dilated cardiomyopathy: Secondary | ICD-10-CM

## 2020-11-23 LAB — BASIC METABOLIC PANEL
BUN/Creatinine Ratio: 14 (ref 12–28)
BUN: 13 mg/dL (ref 8–27)
CO2: 19 mmol/L — ABNORMAL LOW (ref 20–29)
Calcium: 9.5 mg/dL (ref 8.7–10.3)
Chloride: 104 mmol/L (ref 96–106)
Creatinine, Ser: 0.91 mg/dL (ref 0.57–1.00)
Glucose: 89 mg/dL (ref 65–99)
Potassium: 5.1 mmol/L (ref 3.5–5.2)
Sodium: 141 mmol/L (ref 134–144)
eGFR: 71 mL/min/{1.73_m2} (ref >59)

## 2020-11-24 ENCOUNTER — Emergency Department (HOSPITAL_COMMUNITY)
Admission: EM | Admit: 2020-11-24 | Discharge: 2020-11-24 | Disposition: A | Discharge status: Home / Self Care | Payer: Medicare Other | Attending: Emergency Medicine | Admitting: Emergency Medicine

## 2020-11-24 ENCOUNTER — Encounter (HOSPITAL_COMMUNITY): Payer: Self-pay

## 2020-11-24 ENCOUNTER — Other Ambulatory Visit: Payer: Self-pay

## 2020-11-24 DIAGNOSIS — F10129 Alcohol abuse with intoxication, unspecified: Secondary | ICD-10-CM | POA: Insufficient documentation

## 2020-11-24 DIAGNOSIS — Z79899 Other long term (current) drug therapy: Secondary | ICD-10-CM | POA: Insufficient documentation

## 2020-11-24 DIAGNOSIS — F10920 Alcohol use, unspecified with intoxication, uncomplicated: Secondary | ICD-10-CM

## 2020-11-24 DIAGNOSIS — I5023 Acute on chronic systolic (congestive) heart failure: Secondary | ICD-10-CM | POA: Diagnosis not present

## 2020-11-24 DIAGNOSIS — Z9861 Coronary angioplasty status: Secondary | ICD-10-CM | POA: Insufficient documentation

## 2020-11-24 DIAGNOSIS — J449 Chronic obstructive pulmonary disease, unspecified: Secondary | ICD-10-CM | POA: Insufficient documentation

## 2020-11-24 DIAGNOSIS — I110 Hypertensive heart disease with heart failure: Secondary | ICD-10-CM | POA: Diagnosis not present

## 2020-11-24 DIAGNOSIS — I2510 Atherosclerotic heart disease of native coronary artery without angina pectoris: Secondary | ICD-10-CM | POA: Insufficient documentation

## 2020-11-24 DIAGNOSIS — Z87891 Personal history of nicotine dependence: Secondary | ICD-10-CM | POA: Insufficient documentation

## 2020-11-24 DIAGNOSIS — Z95810 Presence of automatic (implantable) cardiac defibrillator: Secondary | ICD-10-CM | POA: Diagnosis not present

## 2020-11-24 DIAGNOSIS — N390 Urinary tract infection, site not specified: Secondary | ICD-10-CM | POA: Insufficient documentation

## 2020-11-24 DIAGNOSIS — Z7982 Long term (current) use of aspirin: Secondary | ICD-10-CM | POA: Insufficient documentation

## 2020-11-24 LAB — CBC WITH DIFFERENTIAL/PLATELET
Abs Immature Granulocytes: 0.01 10*3/uL (ref 0.00–0.07)
Basophils Absolute: 0.1 10*3/uL (ref 0.0–0.1)
Basophils Relative: 1 %
Eosinophils Absolute: 0.5 10*3/uL (ref 0.0–0.5)
Eosinophils Relative: 7 %
HCT: 42.3 % (ref 36.0–46.0)
Hemoglobin: 14.1 g/dL (ref 12.0–15.0)
Immature Granulocytes: 0 %
Lymphocytes Relative: 62 %
Lymphs Abs: 3.8 10*3/uL (ref 0.7–4.0)
MCH: 33 pg (ref 26.0–34.0)
MCHC: 33.3 g/dL (ref 30.0–36.0)
MCV: 99.1 fL (ref 80.0–100.0)
Monocytes Absolute: 0.6 10*3/uL (ref 0.1–1.0)
Monocytes Relative: 9 %
Neutro Abs: 1.3 10*3/uL — ABNORMAL LOW (ref 1.7–7.7)
Neutrophils Relative %: 21 %
Platelets: 138 10*3/uL — ABNORMAL LOW (ref 150–400)
RBC: 4.27 MIL/uL (ref 3.87–5.11)
RDW: 13.3 % (ref 11.5–15.5)
WBC: 6.1 10*3/uL (ref 4.0–10.5)
nRBC: 0 % (ref 0.0–0.2)

## 2020-11-24 LAB — URINALYSIS, ROUTINE W REFLEX MICROSCOPIC
Bilirubin Urine: NEGATIVE
Glucose, UA: NEGATIVE mg/dL
Ketones, ur: NEGATIVE mg/dL
Nitrite: NEGATIVE
Protein, ur: NEGATIVE mg/dL
Specific Gravity, Urine: 1.003 — ABNORMAL LOW (ref 1.005–1.030)
pH: 6 (ref 5.0–8.0)

## 2020-11-24 LAB — COMPREHENSIVE METABOLIC PANEL
ALT: 39 U/L (ref 0–44)
AST: 89 U/L — ABNORMAL HIGH (ref 15–41)
Albumin: 4.1 g/dL (ref 3.5–5.0)
Alkaline Phosphatase: 106 U/L (ref 38–126)
Anion gap: 12 (ref 5–15)
BUN: 12 mg/dL (ref 8–23)
CO2: 21 mmol/L — ABNORMAL LOW (ref 22–32)
Calcium: 9.4 mg/dL (ref 8.9–10.3)
Chloride: 102 mmol/L (ref 98–111)
Creatinine, Ser: 0.87 mg/dL (ref 0.44–1.00)
GFR, Estimated: >60 mL/min (ref >60)
Glucose, Bld: 102 mg/dL — ABNORMAL HIGH (ref 70–99)
Potassium: 4.2 mmol/L (ref 3.5–5.1)
Sodium: 135 mmol/L (ref 135–145)
Total Bilirubin: 0.5 mg/dL (ref 0.3–1.2)
Total Protein: 8.2 g/dL — ABNORMAL HIGH (ref 6.5–8.1)

## 2020-11-24 LAB — LIPASE, BLOOD: Lipase: 40 U/L (ref 11–51)

## 2020-11-24 LAB — ETHANOL: Alcohol, Ethyl (B): 69 mg/dL — ABNORMAL HIGH (ref <10)

## 2020-11-24 MED ORDER — CEPHALEXIN 500 MG PO CAPS: 500.0000 mg | ORAL_CAPSULE | Freq: Four times a day (QID) | ORAL | 0 refills | Status: DC

## 2020-11-24 NOTE — Discharge Instructions (Signed)
Please return for any problem.   Drink alcohol in moderation.

## 2020-11-24 NOTE — ED Triage Notes (Signed)
Pt arrives EMS from home after daughter called out stating "she was having an episode". Unable to elaborate what the episode is. Reportedly drinking alcohol all afternoon.

## 2020-11-24 NOTE — ED Notes (Signed)
Pt ambulated to restroom without assistance to provide urine sample.

## 2020-11-24 NOTE — ED Provider Notes (Signed)
[facility] DEPT Provider Note   CSN: [id number] Arrival date & time: 11/24/20  0235     History Chief Complaint  Patient presents with  . Alcohol Intoxication    [name] is a 65 y.o. female.  65 year old female with prior medical history as detailed below presents for evaluation.  Patient reports that she had "a bit" of alcohol yesterday.  She denies specific complaint at this time.  The history is provided by the patient and medical records.  Alcohol Intoxication This is a new problem. The current episode started yesterday. Pertinent negatives include no chest pain, no abdominal pain and no headaches. Nothing aggravates the symptoms. Nothing relieves the symptoms.       Past Medical History:  Diagnosis Date  . Acute on chronic systolic heart failure ([place])   . Acute respiratory failure ([place])   . Automatic implantable cardioverter-defibrillator in situ   . CAD (coronary artery disease)    a. Inf-post MI 2008 s/p BMS to large marginal of Cx.   Marland Kitchen COPD (chronic obstructive pulmonary disease) ([place])   . ETOH abuse   . Hyperglycemia   . HYPERLIPIDEMIA-MIXED 02/07/2009  . HYPERTENSION, BENIGN 08/22/2009  . PVD (peripheral vascular disease) ([place])    a. Evaluated by Dr. [name] Anon 08/2012.  Marland Kitchen SYSTOLIC HEART FAILURE, CHRONIC 02/07/2009   a. EF 30% 2010, 29% by MRI 08/2012. b. s/p prophylactic Medtronic ICD implantation 10/2012.  Marland Kitchen TIA (transient ischemic attack) 05/18/2014  . Tobacco abuse   . VENTRICULAR TACHYCARDIA 05/21/2010    Patient Active Problem List   Diagnosis Date Noted  . Abnormal CT scan, esophagus   . CAD (coronary artery disease)   . Acute respiratory failure ([place])   . Acute on chronic systolic heart failure ([place])   . Hyperglycemia   . COPD exacerbation ([place])   . CAP (community acquired pneumonia) 05/17/2016  . Essential hypertension   . COPD with acute exacerbation ([place]) 12/02/2014  . Systolic heart failure ([place]) 12/02/2014  .  Dyspnea 12/02/2014  . Alcohol use ([place]) 12/02/2014  . Elevated hemoglobin ([place]) 12/02/2014  . Acute bronchitis 12/02/2014  . Acute respiratory failure with hypoxia ([place])   . Malnutrition of moderate degree ([place]) 05/19/2014  . Right facial numbness 05/18/2014  . Weakness of right lower extremity 05/18/2014  . TIA (transient ischemic attack) 05/18/2014  . 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  . Smoker 08/11/2012  . PVD (peripheral vascular disease) ([place]) 08/11/2012  . VENTRICULAR TACHYCARDIA 05/21/2010  . BEN HTN HEART DISEASE WITHOUT HEART FAIL 08/28/2009  . HYPERTENSION, BENIGN 08/22/2009  . CHEST PAIN-PRECORDIAL 07/18/2009  . CARDIOVASCULAR FUNCTION STUDY, ABNORMAL 07/18/2009  . Elevated lipids 02/07/2009  . CAD, NATIVE VESSEL 02/07/2009  . Nonischemic cardiomyopathy ([place]) 02/07/2009  . SYSTOLIC HEART FAILURE, CHRONIC 02/07/2009    Past Surgical History:  Procedure Laterality Date  . CARDIAC DEFIBRILLATOR PLACEMENT  10/2012  . CESAREAN SECTION  1977; 1982  . COLONOSCOPY    . CYSTOSCOPY W/ STONE MANIPULATION  1990's  . ESOPHAGOGASTRODUODENOSCOPY (EGD) WITH PROPOFOL N/A 08/04/2018   Procedure: ESOPHAGOGASTRODUODENOSCOPY (EGD) WITH PROPOFOL;  Surgeon: Gatha Mayer, MD;  Location: [facility] ENDOSCOPY;  Service: Endoscopy;  Laterality: N/A;  . IMPLANTABLE CARDIOVERTER DEFIBRILLATOR IMPLANT N/A 10/21/2012   Procedure: IMPLANTABLE CARDIOVERTER DEFIBRILLATOR IMPLANT;  Surgeon: Evans Lance, MD;  Location: [facility] CATH LAB;  Service: Cardiovascular;  Laterality: N/A;  . RIGHT/LEFT HEART CATH AND CORONARY ANGIOGRAPHY N/A 03/26/2018   Procedure: RIGHT/LEFT HEART CATH AND CORONARY ANGIOGRAPHY;  Surgeon: End,  Harrell Gave, MD;  Location: [facility] CV LAB;  Service: Cardiovascular;  Laterality: N/A;     OB History   No obstetric history on file.     Family History  Problem Relation Age of Onset  . Cancer Mother        cervical  . Heart attack Father   . Asthma  Brother   . Other Brother        Pacemaker    Social History   Tobacco Use  . Smoking status: Former Smoker    Packs/day: 0.50    Years: 40.00    Pack years: 20.00    Types: Cigarettes    Quit date: 04/27/2018    Years since quitting: 2.5  . Smokeless tobacco: Never Used  . Tobacco comment: pt is on nicotine patches   Vaping Use  . Vaping Use: Never used  Substance Use Topics  . Alcohol use: Yes    Alcohol/week: 20.0 standard drinks    Types: 20 Cans of beer per week    Comment: 05/18/2014 "2, 40's qod"; 1 beer last week   . Drug use: No    Home Medications Prior to Admission medications   Medication Sig Start Date End Date Taking? Authorizing Provider  albuterol (PROAIR HFA) 108 (90 Base) MCG/ACT inhaler Inhale 2 puffs into the lungs every 6 (six) hours as needed for wheezing or shortness of breath. 03/28/20   Brand Males, MD  aspirin EC 81 MG tablet Take 81 mg by mouth daily.    [provider]  carvedilol (COREG) 12.5 MG tablet Take 1 tablet (12.5 mg total) by mouth 2 (two) times daily with a meal. 05/25/19   [name], MD  ENTRESTO 97-103 MG TAKE 1 TABLET BY MOUTH TWICE A DAY 08/17/20   [name], MD  feeding supplement, ENSURE COMPLETE, (ENSURE COMPLETE) LIQD Take 237 mLs by mouth daily. 05/19/14   Cristal Ford, DO  folic acid (FOLVITE) 1 MG tablet Take 1 mg by mouth daily.  05/08/18   [provider]  nitroGLYCERIN (NITROSTAT) 0.4 MG SL tablet Place 1 tablet (0.4 mg total) under the tongue every 5 (five) minutes as needed for chest pain (up to 3 doses). 11/03/17   [name], MD    Allergies    Clopidogrel bisulfate  Review of Systems   Review of Systems  Cardiovascular: Negative for chest pain.  Gastrointestinal: Negative for abdominal pain.  Neurological: Negative for headaches.  All other systems reviewed and are negative.   Physical Exam Updated Vital Signs BP 133/73   Pulse (!) 53   Temp 98.4 F (36.9 C) (Oral)    Resp 17   Ht 5\' 10"  (1.778 m)   Wt 80.7 kg   SpO2 100%   BMI 25.54 kg/m   Physical Exam Vitals and nursing note reviewed.  Constitutional:      General: She is not in acute distress.    Appearance: Normal appearance. She is well-developed.  HENT:     Head: Normocephalic and atraumatic.  Eyes:     Conjunctiva/sclera: Conjunctivae normal.     Pupils: Pupils are equal, round, and reactive to light.  Cardiovascular:     Rate and Rhythm: Normal rate and regular rhythm.     Heart sounds: Normal heart sounds.  Pulmonary:     Effort: Pulmonary effort is normal. No respiratory distress.     Breath sounds: Normal breath sounds.  Abdominal:     General: There is no distension.  Palpations: Abdomen is soft.     Tenderness: There is no abdominal tenderness.  Musculoskeletal:        General: No deformity. Normal range of motion.     Cervical back: Normal range of motion and neck supple.  Skin:    General: Skin is warm and dry.  Neurological:     Mental Status: She is alert and oriented to person, place, and time. Mental status is at baseline.     ED Results / Procedures / Treatments   Labs (all labs ordered are listed, but only abnormal results are displayed) Labs Reviewed  ETHANOL - Abnormal; Notable for the following components:      Result Value   Alcohol, Ethyl (B) 69 (*)    All other components within normal limits  COMPREHENSIVE METABOLIC PANEL - Abnormal; Notable for the following components:   CO2 21 (*)    Glucose, Bld 102 (*)    Total Protein 8.2 (*)    AST 89 (*)    All other components within normal limits  CBC WITH DIFFERENTIAL/PLATELET - Abnormal; Notable for the following components:   Platelets 138 (*)    Neutro Abs 1.3 (*)    All other components within normal limits  URINALYSIS, ROUTINE W REFLEX MICROSCOPIC - Abnormal; Notable for the following components:   Color, Urine STRAW (*)    Specific Gravity, Urine 1.003 (*)    Hgb urine dipstick SMALL (*)     Leukocytes,Ua LARGE (*)    Bacteria, UA RARE (*)    All other components within normal limits  LIPASE, BLOOD    EKG None  Radiology No results found.  Procedures Procedures   Medications Ordered in ED Medications - No data to display  ED Course  I have reviewed the triage vital signs and the nursing notes.  Pertinent labs & imaging results that were available during my care of the patient were reviewed by me and considered in my medical decision making (see chart for details).    MDM Rules/Calculators/A&P                          MDM  Screen complete  AOLANI PIGGOTT was evaluated in Emergency Department on 11/24/2020 for the symptoms described in the history of present illness. She was evaluated in the context of the global COVID-19 pandemic, which necessitated consideration that the patient might be at risk for infection with the SARS-CoV-2 virus that causes COVID-19. Institutional protocols and algorithms that pertain to the evaluation of patients at risk for COVID-19 are in a state of rapid change based on information released by regulatory bodies including the CDC and federal and state organizations. These policies and algorithms were followed during the patient's care in the ED.  Patient is presenting for evaluation.  Patient with significant alcohol intake reported yesterday and overnight.  Patient's alcohol level here was 69.  Suspect that the majority of the symptoms that concerned her family were secondary to alcohol intoxication  Patient did complain of mild suprapubic discomfort and dysuria.  UA is suggestive of early UTI.  Will treat for same.  Importance of close follow-up is stressed.  Strict return precautions given and understood.  Final Clinical Impression(s) / ED Diagnoses Final diagnoses:  Acute alcoholic intoxication without complication ([place])  Urinary tract infection without hematuria, site unspecified    Rx / DC Orders ED Discharge Orders          Ordered  cephALEXin (KEFLEX) 500 MG capsule  4 times daily        11/24/20 0932           Valarie Merino, MD 11/24/20 [phone]

## 2020-11-24 NOTE — ED Notes (Signed)
Pt was assisted with use of the phone in efforts to contact family

## 2020-11-24 NOTE — ED Notes (Signed)
Call received from pt daughter Eri Mcevers [phone] requesting rtn call re: pt status/updates. Daughter requests to be contacted once pt is moved to exam room. [employer]

## 2020-11-27 ENCOUNTER — Ambulatory Visit (INDEPENDENT_AMBULATORY_CARE_PROVIDER_SITE_OTHER): Payer: Medicare Other

## 2020-11-27 DIAGNOSIS — I5022 Chronic systolic (congestive) heart failure: Secondary | ICD-10-CM

## 2020-11-27 DIAGNOSIS — Z95810 Presence of automatic (implantable) cardiac defibrillator: Secondary | ICD-10-CM

## 2020-11-30 ENCOUNTER — Telehealth: Payer: Self-pay

## 2020-11-30 NOTE — Telephone Encounter (Signed)
Pt agreed to send missed ICM transmission today.

## 2020-12-01 ENCOUNTER — Telehealth: Payer: Self-pay

## 2020-12-01 NOTE — Telephone Encounter (Signed)
Remote ICM transmission received.  Attempted call to patient regarding ICM remote transmission and left detailed message per DPR.  Advised to return call for any fluid symptoms or questions. Next ICM remote transmission scheduled 01/01/2021.

## 2020-12-01 NOTE — Progress Notes (Signed)
EPIC Encounter for ICM Monitoring  Patient Name: [name] is a 65 y.o. female Date: 12/01/2020 Primary Care Physican: Vicenta Aly, [place] Primary Cardiologist:Nishan Electrophysiologist:Taylor Weight:does not weigh at home   Attempted call to patient and unable to reach.  Transmission reviewed.   Optivol thoracic impedance suggesting normal fluid levels.   Recommendations: Left voice mail with ICM number and encouraged to call if experiencing any fluid symptoms.  Follow-up plan: ICM clinic phone appointment on4/25/2022. 91 day device clinic remote transmission4/04/2021.   EP/Cardiology Office Visits:  Recall 08/11/2021 with Dr Johnsie Cancel.   Recall 2/18/202with Dr Lovena Le, Isabel Caprice PA or Estrella Myrtle, [place].   Copy of ICM check sent to Dr.Taylor   3 month ICM trend: 11/30/2020.    1 Year ICM trend:       [name], RN 12/01/2020 10:12 AM

## 2020-12-15 ENCOUNTER — Ambulatory Visit (INDEPENDENT_AMBULATORY_CARE_PROVIDER_SITE_OTHER): Payer: Medicare Other

## 2020-12-15 DIAGNOSIS — I420 Dilated cardiomyopathy: Secondary | ICD-10-CM

## 2020-12-19 LAB — CUP PACEART REMOTE DEVICE CHECK
Battery Remaining Longevity: 41 mo
Battery Voltage: 2.97 V
Brady Statistic RV Percent Paced: 0.11 %
Date Time Interrogation Session: 20220408081926
HighPow Impedance: 76 Ohm
Implantable Lead Implant Date: 20140212
Implantable Lead Location: 753860
Implantable Lead Model: 6935
Implantable Pulse Generator Implant Date: 20140212
Lead Channel Impedance Value: 456 Ohm
Lead Channel Impedance Value: 532 Ohm
Lead Channel Pacing Threshold Amplitude: 0.5 V
Lead Channel Pacing Threshold Pulse Width: 0.4 ms
Lead Channel Sensing Intrinsic Amplitude: 8.875 mV
Lead Channel Sensing Intrinsic Amplitude: 8.875 mV
Lead Channel Setting Pacing Amplitude: 2.5 V
Lead Channel Setting Pacing Pulse Width: 0.4 ms
Lead Channel Setting Sensing Sensitivity: 0.3 mV

## 2020-12-29 NOTE — Progress Notes (Signed)
Remote ICD transmission.   

## 2021-01-05 NOTE — Progress Notes (Signed)
No ICM remote transmission received for 01/01/2021 and next ICM transmission scheduled for 02/12/2021.   

## 2021-02-12 ENCOUNTER — Ambulatory Visit (INDEPENDENT_AMBULATORY_CARE_PROVIDER_SITE_OTHER): Payer: Medicare Other

## 2021-02-12 DIAGNOSIS — I5022 Chronic systolic (congestive) heart failure: Secondary | ICD-10-CM | POA: Diagnosis not present

## 2021-02-12 DIAGNOSIS — Z95810 Presence of automatic (implantable) cardiac defibrillator: Secondary | ICD-10-CM | POA: Diagnosis not present

## 2021-02-15 ENCOUNTER — Telehealth: Payer: Self-pay

## 2021-02-15 NOTE — Telephone Encounter (Signed)
LMOVM for patient to send missed ICM transmission. 

## 2021-02-16 NOTE — Progress Notes (Signed)
EPIC Encounter for ICM Monitoring  Patient Name: [name] is a 65 y.o. female Date: 02/16/2021 Primary Care Physican: Vicenta Aly, [place] Primary Cardiologist: Johnsie Cancel Electrophysiologist: Lovena Le Weight: does not weigh at home                                                            Spoke with patient and reports feeling well at this time. Heart failure questions reviewed. Pt asymptomatic.   Optivol thoracic impedance suggesting normal fluid levels.    Recommendations:  No changes and encouraged to call if experiencing any fluid symptoms.   Follow-up plan: ICM clinic phone appointment on 03/26/2021.   91 day device clinic remote transmission 03/16/2021.     EP/Cardiology Office Visits:   Recall 08/11/2021 with Dr Johnsie Cancel.   Recall 10/27/2021 with Dr Lovena Le, Isabel Caprice PA or Estrella Myrtle, [place].   Copy of ICM check sent to Dr. Lovena Le    3 month ICM trend: 02/15/2021.    1 Year ICM trend:       [name], RN 02/16/2021 8:59 AM

## 2021-03-29 ENCOUNTER — Telehealth: Payer: Self-pay

## 2021-03-29 NOTE — Telephone Encounter (Signed)
LMOVM for patient to send missed ICM transmission. 

## 2021-04-03 NOTE — Progress Notes (Signed)
No ICM remote transmission received for 03/26/2021 and next ICM transmission scheduled for 04/16/2021.    

## 2021-04-16 ENCOUNTER — Ambulatory Visit (INDEPENDENT_AMBULATORY_CARE_PROVIDER_SITE_OTHER): Payer: Medicare Other

## 2021-04-16 DIAGNOSIS — I5022 Chronic systolic (congestive) heart failure: Secondary | ICD-10-CM

## 2021-04-16 DIAGNOSIS — Z95810 Presence of automatic (implantable) cardiac defibrillator: Secondary | ICD-10-CM

## 2021-04-19 ENCOUNTER — Telehealth: Payer: Self-pay

## 2021-04-19 NOTE — Telephone Encounter (Signed)
Lmovm for patient to send missed ICM transmission.

## 2021-04-20 ENCOUNTER — Telehealth: Payer: Self-pay

## 2021-04-20 NOTE — Telephone Encounter (Signed)
Remote ICM transmission received.  Attempted call to patient regarding ICM remote transmission and left detailed message per DPR.  Advised to return call for any fluid symptoms or questions. Next ICM remote transmission scheduled 05/21/2021.

## 2021-04-20 NOTE — Progress Notes (Signed)
EPIC Encounter for ICM Monitoring  Patient Name: [name] is a 65 y.o. female Date: 04/20/2021 Primary Care Physican: Vicenta Aly, [place] Primary Cardiologist: Johnsie Cancel Electrophysiologist: Lovena Le Weight: does not weigh at home                                                            Attempted call to patient and unable to reach.  Left detailed message per DPR regarding transmission. Transmission reviewed.    Optivol thoracic impedance suggesting normal fluid levels.    Recommendations:  Left voice mail with ICM number and encouraged to call if experiencing any fluid symptoms.   Follow-up plan: ICM clinic phone appointment on 05/21/2021.   91 day device clinic remote transmission 06/15/2021.     EP/Cardiology Office Visits:   08/13/2021 with Dr Johnsie Cancel.   Recall 10/27/2021 with Dr Lovena Le, Tommye Standard PA or Estrella Myrtle, [place].   Copy of ICM check sent to Dr. Lovena Le.   3 month ICM trend: 04/19/2021.    1 Year ICM trend:       [name], RN 04/20/2021 9:29 AM

## 2021-05-21 ENCOUNTER — Ambulatory Visit (INDEPENDENT_AMBULATORY_CARE_PROVIDER_SITE_OTHER): Payer: Medicare Other

## 2021-05-21 DIAGNOSIS — I5022 Chronic systolic (congestive) heart failure: Secondary | ICD-10-CM | POA: Diagnosis not present

## 2021-05-21 DIAGNOSIS — Z95810 Presence of automatic (implantable) cardiac defibrillator: Secondary | ICD-10-CM | POA: Diagnosis not present

## 2021-05-23 ENCOUNTER — Telehealth: Payer: Self-pay

## 2021-05-23 NOTE — Telephone Encounter (Signed)
I spoke with the patient and she agreed to send missed ICM transmission. ?

## 2021-05-25 NOTE — Progress Notes (Signed)
EPIC Encounter for ICM Monitoring  Patient Name: [name] is a 65 y.o. female Date: 05/25/2021 Primary Care Physican: Vicenta Aly, [place] Primary Cardiologist: Johnsie Cancel Electrophysiologist: Lovena Le Weight: does not weigh at home                                                            Spoke with patient and heart failure questions reviewed.  Pt asymptomatic for fluid accumulation and feeling well.   Optivol thoracic impedance suggesting normal fluid levels.    Recommendations:  No changes and encouraged to call if experiencing any fluid symptoms.   Follow-up plan: ICM clinic phone appointment on 06/25/2021.   91 day device clinic remote transmission 06/15/2021.     EP/Cardiology Office Visits:   08/13/2021 with Dr Johnsie Cancel.   Recall 10/27/2021 with Dr Lovena Le, Tommye Standard PA or Estrella Myrtle, [place].   Copy of ICM check sent to Dr. Lovena Le.   3 month ICM trend: 05/23/2021.    1 Year ICM trend:       [name], RN 05/25/2021 9:04 AM

## 2021-06-15 ENCOUNTER — Ambulatory Visit (INDEPENDENT_AMBULATORY_CARE_PROVIDER_SITE_OTHER): Payer: Medicare Other

## 2021-06-15 DIAGNOSIS — I4729 Other ventricular tachycardia: Secondary | ICD-10-CM

## 2021-06-19 LAB — CUP PACEART REMOTE DEVICE CHECK
Battery Remaining Longevity: 34 mo
Battery Voltage: 2.96 V
Brady Statistic RV Percent Paced: 0.05 %
Date Time Interrogation Session: 20221007122030
HighPow Impedance: 75 Ohm
Implantable Lead Implant Date: 20140212
Implantable Lead Location: 753860
Implantable Lead Model: 6935
Implantable Pulse Generator Implant Date: 20140212
Lead Channel Impedance Value: 456 Ohm
Lead Channel Impedance Value: 494 Ohm
Lead Channel Pacing Threshold Amplitude: 0.5 V
Lead Channel Pacing Threshold Pulse Width: 0.4 ms
Lead Channel Sensing Intrinsic Amplitude: 8.375 mV
Lead Channel Sensing Intrinsic Amplitude: 8.375 mV
Lead Channel Setting Pacing Amplitude: 2.5 V
Lead Channel Setting Pacing Pulse Width: 0.4 ms
Lead Channel Setting Sensing Sensitivity: 0.3 mV

## 2021-06-25 ENCOUNTER — Ambulatory Visit (INDEPENDENT_AMBULATORY_CARE_PROVIDER_SITE_OTHER): Payer: Medicare Other

## 2021-06-25 DIAGNOSIS — Z95810 Presence of automatic (implantable) cardiac defibrillator: Secondary | ICD-10-CM

## 2021-06-25 DIAGNOSIS — I5022 Chronic systolic (congestive) heart failure: Secondary | ICD-10-CM | POA: Diagnosis not present

## 2021-06-25 NOTE — Progress Notes (Signed)
Remote ICD transmission.   

## 2021-06-28 ENCOUNTER — Telehealth: Payer: Self-pay

## 2021-06-28 NOTE — Telephone Encounter (Signed)
I spoke with the patient about missed transmission. She agreed to send one today. The patient states she need a letter telling her when she needs to transmit because she do not be remembering when to do it. She would like for Margarita Grizzle to call her if she do not see the transmission to see if she doing it right.

## 2021-06-29 NOTE — Progress Notes (Signed)
EPIC Encounter for ICM Monitoring  Patient Name: [name] is a 65 y.o. female Date: 06/29/2021 Primary Care Physican: Vicenta Aly, [place] Primary Cardiologist: Johnsie Cancel Electrophysiologist: Lovena Le Weight: does not weigh at home                                                            Spoke with patient and heart failure questions reviewed.  Pt asymptomatic for fluid accumulation and feeling well.   Optivol thoracic impedance suggesting normal fluid levels.    Recommendations:  Recommendation to limit salt intake to 2000 mg daily and fluid intake to 64 oz daily.  Encouraged to call if experiencing any fluid symptoms.    Follow-up plan: ICM clinic phone appointment on 08/06/2021.   91 day device clinic remote transmission 09/14/2021.     EP/Cardiology Office Visits:   08/13/2021 with Dr Johnsie Cancel.   Recall 10/27/2021 with Dr Lovena Le, Tommye Standard PA or Estrella Myrtle, [place].   Copy of ICM check sent to Dr. Lovena Le.    3 month ICM trend: 06/28/2021.    1 Year ICM trend:       [name], RN 06/29/2021 11:24 AM

## 2021-06-29 NOTE — Telephone Encounter (Signed)
Transmission received. Optivol below threshold. Routing to [facility] clinic.

## 2021-06-29 NOTE — Telephone Encounter (Signed)
See 06/25/2021 ICM note for follow up.

## 2021-08-06 ENCOUNTER — Ambulatory Visit (INDEPENDENT_AMBULATORY_CARE_PROVIDER_SITE_OTHER): Payer: Medicare Other

## 2021-08-06 DIAGNOSIS — I5022 Chronic systolic (congestive) heart failure: Secondary | ICD-10-CM | POA: Diagnosis not present

## 2021-08-06 DIAGNOSIS — Z95810 Presence of automatic (implantable) cardiac defibrillator: Secondary | ICD-10-CM | POA: Diagnosis not present

## 2021-08-06 NOTE — Progress Notes (Signed)
We attempted video but she could not do at this time  Date:  08/13/2021   ID:  [name], DOB 1956/04/03, MRN [id number]   PCP:  Vicenta Aly, FNP  Cardiologist:  Jenkins Rouge, MD  Electrophysiologist:  Cristopher Peru, MD   Evaluation Performed:  Follow-Up Visit  Chief Complaint:  CAD  History of Present Illness:     65 y.o. 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 Known PVD with bilateral SFA occlusions July 2019 has relapse with EF 20-25% cath with patent stent and mild dx elsewhere There seems to be and issue with ETOH abuse    Seen by pulmonary 06/08/18 Gold stage 2 and on Spiriva now   CT July 2019 showed esophagitis referred to GI    Echo 01/20/19 with EF up to 40-45% from 20-25% with moderate AR - to have a follow up echo in 1 year.  Remote device check 12/15/18 with normal.  ABIs done 11/2018 with mild decrease, plan to monitor.  No claudication   She indicates not smoking since beginning of the year She has not been taking her aldactone and ICM monitoring shows elevated impedence/volume    Past Medical History:  Diagnosis Date   Acute on chronic systolic heart failure (HCC)    Acute respiratory failure (HCC)    Automatic implantable cardioverter-defibrillator in situ    CAD (coronary artery disease)    a. Inf-post MI 2008 s/p BMS to large marginal of Cx.    COPD (chronic obstructive pulmonary disease) ([place])    ETOH abuse    Hyperglycemia    HYPERLIPIDEMIA-MIXED 02/07/2009   HYPERTENSION, BENIGN 08/22/2009   PVD (peripheral vascular disease) ([place])    a. Evaluated by Dr. [name] Anon 08/2012.   SYSTOLIC HEART FAILURE, CHRONIC 02/07/2009   a. EF 30% 2010, 29% by MRI 08/2012. b. s/p prophylactic Medtronic ICD implantation 10/2012.   TIA (transient ischemic attack) 05/18/2014   Tobacco abuse    VENTRICULAR TACHYCARDIA 05/21/2010   Past Surgical History:  Procedure Laterality Date    CARDIAC DEFIBRILLATOR PLACEMENT  10/2012   CESAREAN SECTION  1977; 1982   COLONOSCOPY     CYSTOSCOPY W/ STONE MANIPULATION  1990's   ESOPHAGOGASTRODUODENOSCOPY (EGD) WITH PROPOFOL N/A 08/04/2018   Procedure: ESOPHAGOGASTRODUODENOSCOPY (EGD) WITH PROPOFOL;  Surgeon: Gatha Mayer, MD;  Location: [facility];  Service: Endoscopy;  Laterality: N/A;   IMPLANTABLE CARDIOVERTER DEFIBRILLATOR IMPLANT N/A 10/21/2012   Procedure: IMPLANTABLE CARDIOVERTER DEFIBRILLATOR IMPLANT;  Surgeon: Evans Lance, MD;  Location: [facility] CATH LAB;  Service: Cardiovascular;  Laterality: N/A;   RIGHT/LEFT HEART CATH AND CORONARY ANGIOGRAPHY N/A 03/26/2018   Procedure: RIGHT/LEFT HEART CATH AND CORONARY ANGIOGRAPHY;  Surgeon: Nelva Bush, MD;  Location: [place] CV LAB;  Service: Cardiovascular;  Laterality: N/A;     No outpatient medications have been marked as taking for the 08/13/21 encounter (Office Visit) with [name], MD.     Allergies:   Clopidogrel bisulfate   Social History   Tobacco Use   Smoking status: Former    Packs/day: 0.50    Years: 40.00    Pack years: 20.00    Types: Cigarettes    Quit date: 04/27/2018    Years since quitting: 3.2   Smokeless tobacco: Never   Tobacco comments:    pt is on nicotine patches   Vaping Use   Vaping Use: Never used  Substance  Use Topics   Alcohol use: Yes    Alcohol/week: 20.0 standard drinks    Types: 20 Cans of beer per week    Comment: 05/18/2014 "2, 40's qod"; 1 beer last week    Drug use: No     Family Hx: The patient's family history includes Asthma in her brother; Cancer in her mother; Heart attack in her father; Other in her brother.  ROS:   Please see the history of present illness.    General:no colds or fevers, + weight loss, she put herself on a diet Skin:no rashes or ulcers HEENT:no blurred vision, no congestion CV:see HPI PUL:see HPI GI:no diarrhea constipation or melena, no indigestion GU:no hematuria, no dysuria MS:no  joint pain, no claudication Neuro:no syncope, no lightheadedness Endo:no diabetes, no thyroid disease  All other systems reviewed and are negative.   Prior CV studies:   The following studies were reviewed today:  Echo 01/20/19 IMPRESSIONS      1. Severe hypokinesis of the left ventricular, basal-mid inferolateral wall, anterolateral wall and inferior wall.  2. The left ventricle has mild-moderately reduced systolic function, with an ejection fraction of 40-45%. The cavity size was mildly dilated. Left ventricular diastolic Doppler parameters are consistent with impaired relaxation. Elevated mean left  atrial pressure.  3. The right ventricle has normal systolic function. The cavity was normal. There is no increase in right ventricular wall thickness.  4. The aortic valve is tricuspid. Aortic valve regurgitation is moderate by color flow Doppler.   FINDINGS  Left Ventricle: The left ventricle has mild-moderately reduced systolic function, with an ejection fraction of 40-45%. The cavity size was mildly dilated. There is no increase in left ventricular wall thickness. Left ventricular diastolic Doppler  parameters are consistent with impaired relaxation. Elevated mean left atrial pressure Severe hypokinesis of the left ventricular, basal-mid inferolateral wall, anterolateral wall and inferior wall.   Right Ventricle: The right ventricle has normal systolic function. The cavity was normal. There is no increase in right ventricular wall thickness. Pacing wire/catheter visualized in the right ventricle.   Left Atrium: Left atrial size was normal in size.   Right Atrium: Right atrial size was normal in size. Right atrial pressure is estimated at 10 mmHg.   Interatrial Septum: No atrial level shunt detected by color flow Doppler.   Pericardium: There is no evidence of pericardial effusion.   Mitral Valve: The mitral valve is normal in structure. Mitral valve regurgitation is mild by color  flow Doppler.   Tricuspid Valve: The tricuspid valve is normal in structure. Tricuspid valve regurgitation is trivial by color flow Doppler.   Aortic Valve: The aortic valve is tricuspid Aortic valve regurgitation is moderate by color flow Doppler.   Pulmonic Valve: The pulmonic valve was normal in structure. Pulmonic valve regurgitation is trivial by color flow Doppler.   Venous: The inferior vena cava is normal in size with greater than 50% respiratory variability.       Labs/Other Tests and Data Reviewed:    EKG:  An ECG dated 01/03/19 was personally reviewed today and demonstrated:  SR with Q waves III, AVF, deep T wave V5-6  Recent Labs: 11/24/2020: ALT 39; BUN 12; Creatinine, Ser 0.87; Hemoglobin 14.1; Platelets 138; Potassium 4.2; Sodium 135   Recent Lipid Panel Lab Results  Component Value Date/Time   CHOL 130 03/23/2018 09:00 AM   CHOL 153 02/24/2017 11:54 AM   TRIG 66 03/23/2018 09:00 AM   HDL 39 (L) 03/23/2018 09:00 AM  HDL 74 02/24/2017 11:54 AM   CHOLHDL 3.3 03/23/2018 09:00 AM   LDLCALC 78 03/23/2018 09:00 AM   LDLCALC 67 02/24/2017 11:54 AM    Wt Readings from Last 3 Encounters:  08/13/21 175 lb (79.4 kg)  11/24/20 178 lb (80.7 kg)  10/31/20 178 lb 3.2 oz (80.8 kg)     Objective:    Vital Signs:  BP 102/70   Pulse 64   Ht 5\' 9"  (1.753 m)   Wt 175 lb (79.4 kg)   SpO2 97%   BMI 25.84 kg/m    Affect appropriate Chronically ill black female  HEENT: normal Neck supple with no adenopathy JVP normal no bruits no thyromegaly Lungs clear with no wheezing and good diaphragmatic motion Heart:  S1/S2 no murmur, no rub, gallop or click PMI increased AICD under left clavicle  Abdomen: benighn, BS positve, no tenderness, no AAA no bruit.  No HSM or HJR Distal pulses intact with no bruits No edema Neuro non-focal Skin warm and dry No muscular weakness   ASSESSMENT & PLAN:    NICM with EF to 25-30% on echo, now EF improved to 40-45% she continues on  Coreg, entresto and aldactone, Discussed ETOH use she indicates only having a glass of red wine " on occasion"  Discussed importance of aldactone and refill called in BMET/BNP in 3 weeks after restarting Refills for coreg/entresto also called in   AICD:  No shocks normal function f/u [employer] Medtronic device   CAD patent stent in OM no residual dx cath 03/26/18 continue medical Rx  HLD on statin labs with primary   HTN Well controlled.  Continue current medications and low sodium Dash type diet.    Tobacco use, has stopped  + COPD and followed by Pulmonary CT 04/10/20 with moderate emphysema diffuse bronchial thickening no cancer    Medication Adjustments/Labs and Tests Ordered: Current medicines are reviewed at length with the patient today.  Concerns regarding medicines are outlined above.   Tests Ordered: No orders of the defined types were placed in this encounter. BMET/BNP 3 weeks   Medication Changes: No orders of the defined types were placed in this encounter. Refill on Aldactone called in has been non compliant with this   Disposition:  Follow up in 6 months   Signed, Jenkins Rouge, MD  08/13/2021 9:31 AM    [place]

## 2021-08-09 ENCOUNTER — Telehealth: Payer: Self-pay

## 2021-08-09 NOTE — Telephone Encounter (Signed)
The patient states she will send missed ICM transmission before 5 pm. I gave her the number to tech support for her to get additional help if needed.

## 2021-08-10 ENCOUNTER — Telehealth: Payer: Self-pay

## 2021-08-10 ENCOUNTER — Telehealth: Payer: Self-pay | Admitting: *Deleted

## 2021-08-10 DIAGNOSIS — I2510 Atherosclerotic heart disease of native coronary artery without angina pectoris: Secondary | ICD-10-CM

## 2021-08-10 DIAGNOSIS — I5023 Acute on chronic systolic (congestive) heart failure: Secondary | ICD-10-CM

## 2021-08-10 DIAGNOSIS — I10 Essential (primary) hypertension: Secondary | ICD-10-CM

## 2021-08-10 MED ORDER — SPIRONOLACTONE 25 MG PO TABS: 12.5000 mg | ORAL_TABLET | Freq: Every day | ORAL | 3 refills | Status: DC

## 2021-08-10 NOTE — Telephone Encounter (Signed)
Pt is returning call regarding a medicine being called into the pharmacy

## 2021-08-10 NOTE — Progress Notes (Signed)
EPIC Encounter for ICM Monitoring  Patient Name: [name] is a 65 y.o. female Date: 08/10/2021 Primary Care Physican: Vicenta Aly, [place] Primary Cardiologist: Johnsie Cancel Electrophysiologist: Lovena Le Weight: does not weigh at home                                                            Attempted call to patient and unable to reach.  Left detailed message per DPR regarding transmission. Transmission reviewed.    Optivol thoracic impedance suggesting possible fluid accumulation starting 07/10/2021.     Recommendations:  Left voice mail with ICM number and encouraged to call if experiencing any fluid symptoms.   Follow-up plan: ICM clinic phone appointment on 08/20/2021 (manual) to recheck fluid levels.   91 day device clinic remote transmission 09/14/2021.     EP/Cardiology Office Visits:   08/13/2021 with Dr Johnsie Cancel.   Recall 10/27/2021 with Dr Lovena Le, Tommye Standard PA or Estrella Myrtle, [place].   Copy of ICM check sent to Dr. Lovena Le and Dr Johnsie Cancel since patient has OV on 12/5.  3 month ICM trend: 08/09/2021.    12-14 Month ICM trend:       [name], RN 08/10/2021 9:20 AM

## 2021-08-10 NOTE — Telephone Encounter (Signed)
Pt wanted to know if script was sent Pt was reassured this med was sent via epic to [facility] [address]

## 2021-08-10 NOTE — Telephone Encounter (Signed)
Remote ICM transmission received.  Attempted call to patient regarding ICM remote transmission and left detailed message per DPR.  Advised to return call for any fluid symptoms or questions. Next ICM remote transmission scheduled 08/20/2021

## 2021-08-10 NOTE — Telephone Encounter (Signed)
Pt aware of recommendations and agrees with plan .cy  [name], MD  Richmond Campbell, LPN; Aris Georgia, Olin Hauser, RN ICM monitoring up start aldactone 12.5 mg daily and f/u BMET / BNP in 2 weeks

## 2021-08-13 ENCOUNTER — Other Ambulatory Visit: Payer: Self-pay

## 2021-08-13 ENCOUNTER — Ambulatory Visit (INDEPENDENT_AMBULATORY_CARE_PROVIDER_SITE_OTHER): Payer: Medicare Other | Admitting: Cardiovascular Disease

## 2021-08-13 ENCOUNTER — Encounter: Payer: Self-pay | Admitting: Cardiovascular Disease

## 2021-08-13 VITALS — BP 102/70 | HR 64 | Ht 69.0 in | Wt 175.0 lb

## 2021-08-13 DIAGNOSIS — I2510 Atherosclerotic heart disease of native coronary artery without angina pectoris: Secondary | ICD-10-CM | POA: Diagnosis not present

## 2021-08-13 DIAGNOSIS — Z95810 Presence of automatic (implantable) cardiac defibrillator: Secondary | ICD-10-CM

## 2021-08-13 DIAGNOSIS — I428 Other cardiomyopathies: Secondary | ICD-10-CM

## 2021-08-13 DIAGNOSIS — I5023 Acute on chronic systolic (congestive) heart failure: Secondary | ICD-10-CM | POA: Diagnosis not present

## 2021-08-13 MED ORDER — ENTRESTO 97-103 MG PO TABS: 1.0000 | ORAL_TABLET | Freq: Two times a day (BID) | ORAL | 3 refills | Status: DC

## 2021-08-13 MED ORDER — SPIRONOLACTONE 25 MG PO TABS: 12.5000 mg | ORAL_TABLET | Freq: Every day | ORAL | 3 refills | Status: DC

## 2021-08-13 MED ORDER — CARVEDILOL 12.5 MG PO TABS: 12.5000 mg | ORAL_TABLET | Freq: Two times a day (BID) | ORAL | 3 refills | Status: DC

## 2021-08-13 NOTE — Patient Instructions (Addendum)
Medication Instructions:  Your physician recommends that you continue on your current medications as directed. Please refer to the Current Medication list given to you today.  *If you need a refill on your cardiac medications before your next appointment, please call your pharmacy*  Lab Work: BMET and BNP on 12/16  If you have labs (blood work) drawn today and your tests are completely normal, you will receive your results only by: [place] (if you have MyChart) OR A paper copy in the mail If you have any lab test that is abnormal or we need to change your treatment, we will call you to review the results.  Follow-Up: At [facility], you and your health needs are our priority.  As part of our continuing mission to provide you with exceptional heart care, we have created designated Provider Care Teams.  These Care Teams include your primary Cardiologist (physician) and Advanced Practice Providers (APPs -  Physician Assistants and Nurse Practitioners) who all work together to provide you with the care you need, when you need it.   Your next appointment:   6 month(s)  The format for your next appointment:   In Person  Provider:   Jenkins Rouge, MD

## 2021-08-15 ENCOUNTER — Telehealth: Payer: Self-pay | Admitting: Cardiovascular Disease

## 2021-08-15 NOTE — Telephone Encounter (Signed)
Called patient back about her message. Patient stated she thought she was suppose to stop Aldactone. Informed patient that back in 10/2020 her aldactone was stop due to lab work. Informed patient that on 08/10/21, Dr. Johnsie Cancel started her back on aldactone 12.5 mg due to ICM monitoring. Informed patient that her lab work will be checked on 08/24/21 and we will see if she needs to make any changes at that time. Patient agreed to plan.

## 2021-08-15 NOTE — Telephone Encounter (Signed)
Pt c/o medication issue:  1. Name of Medication: spironolactone (ALDACTONE) 25 MG tablet  2. How are you currently taking this medication (dosage and times per day)? Take 0.5 tablets (12.5 mg total) by mouth daily.  3. Are you having a reaction (difficulty breathing--STAT)? no  4. What is your medication issue? Patient thought that she was suppose to stop taking this medicine and Dr Johnsie Cancel was going put her on a different kind. Please advise

## 2021-08-23 ENCOUNTER — Telehealth: Payer: Self-pay

## 2021-08-23 NOTE — Telephone Encounter (Signed)
The patient agreed to send missed ICM transmission by tomorrow.

## 2021-08-24 ENCOUNTER — Other Ambulatory Visit: Payer: Medicare Other | Admitting: *Deleted

## 2021-08-24 ENCOUNTER — Other Ambulatory Visit: Payer: Self-pay

## 2021-08-24 DIAGNOSIS — I5023 Acute on chronic systolic (congestive) heart failure: Secondary | ICD-10-CM

## 2021-08-24 DIAGNOSIS — I2510 Atherosclerotic heart disease of native coronary artery without angina pectoris: Secondary | ICD-10-CM

## 2021-08-24 DIAGNOSIS — I10 Essential (primary) hypertension: Secondary | ICD-10-CM

## 2021-08-25 LAB — BASIC METABOLIC PANEL
BUN/Creatinine Ratio: 15 (ref 12–28)
BUN: 13 mg/dL (ref 8–27)
CO2: 21 mmol/L (ref 20–29)
Calcium: 9.7 mg/dL (ref 8.7–10.3)
Chloride: 103 mmol/L (ref 96–106)
Creatinine, Ser: 0.85 mg/dL (ref 0.57–1.00)
Glucose: 90 mg/dL (ref 70–99)
Potassium: 4.7 mmol/L (ref 3.5–5.2)
Sodium: 138 mmol/L (ref 134–144)
eGFR: 77 mL/min/{1.73_m2} (ref >59)

## 2021-08-25 LAB — PRO B NATRIURETIC PEPTIDE: NT-Pro BNP: 252 pg/mL (ref 0–287)

## 2021-08-30 NOTE — Progress Notes (Signed)
No ICM remote transmission received for 08/20/2021 and next ICM transmission scheduled for 09/24/2021.   °

## 2021-09-12 ENCOUNTER — Telehealth: Payer: Self-pay

## 2021-09-12 DIAGNOSIS — R0989 Other specified symptoms and signs involving the circulatory and respiratory systems: Secondary | ICD-10-CM

## 2021-09-12 NOTE — Telephone Encounter (Signed)
Patient was returning call. Please advise ?

## 2021-09-12 NOTE — Telephone Encounter (Signed)
Patient dropped off a form that was completed by her home health nurse regarding her circulation screening (QuantaFlo) on her right ( 0.70 mild/moderate) and left (0.71 mild/moderate) legs. Reviewed by Dr. Johnsie Cancel, he recommends BLE duplex with ABI. Last test done in 11/2018, results showed mild decrease in ABI's. Will place order for test. Called patient, no answer left message to call back, so we can inform her of Dr. Kyla Balzarine response and future test.

## 2021-09-12 NOTE — Telephone Encounter (Signed)
Called patient back. Informed her of testing and that someone will call her to schedule.

## 2021-09-14 ENCOUNTER — Ambulatory Visit (INDEPENDENT_AMBULATORY_CARE_PROVIDER_SITE_OTHER): Payer: Medicare Other

## 2021-09-14 DIAGNOSIS — I5023 Acute on chronic systolic (congestive) heart failure: Secondary | ICD-10-CM

## 2021-09-14 DIAGNOSIS — I428 Other cardiomyopathies: Secondary | ICD-10-CM

## 2021-09-17 LAB — CUP PACEART REMOTE DEVICE CHECK
Battery Remaining Longevity: 31 mo
Battery Voltage: 2.95 V
Brady Statistic RV Percent Paced: 0.06 %
Date Time Interrogation Session: 20230106135208
HighPow Impedance: 76 Ohm
Implantable Lead Implant Date: 20140212
Implantable Lead Location: 753860
Implantable Lead Model: 6935
Implantable Pulse Generator Implant Date: 20140212
Lead Channel Impedance Value: 437 Ohm
Lead Channel Impedance Value: 494 Ohm
Lead Channel Pacing Threshold Amplitude: 0.5 V
Lead Channel Pacing Threshold Pulse Width: 0.4 ms
Lead Channel Sensing Intrinsic Amplitude: 7.75 mV
Lead Channel Sensing Intrinsic Amplitude: 7.75 mV
Lead Channel Setting Pacing Amplitude: 2.5 V
Lead Channel Setting Pacing Pulse Width: 0.4 ms
Lead Channel Setting Sensing Sensitivity: 0.3 mV

## 2021-09-20 ENCOUNTER — Other Ambulatory Visit: Payer: Self-pay

## 2021-09-20 ENCOUNTER — Ambulatory Visit (HOSPITAL_COMMUNITY)
Admission: RE | Admit: 2021-09-20 | Discharge: 2021-09-20 | Disposition: A | Discharge status: Home / Self Care | Payer: Medicare Other | Source: Ambulatory Visit | Attending: Cardiology | Admitting: Cardiology

## 2021-09-20 DIAGNOSIS — R0989 Other specified symptoms and signs involving the circulatory and respiratory systems: Secondary | ICD-10-CM | POA: Diagnosis present

## 2021-09-21 ENCOUNTER — Telehealth: Payer: Self-pay

## 2021-09-21 DIAGNOSIS — I739 Peripheral vascular disease, unspecified: Secondary | ICD-10-CM

## 2021-09-21 DIAGNOSIS — R0989 Other specified symptoms and signs involving the circulatory and respiratory systems: Secondary | ICD-10-CM

## 2021-09-21 NOTE — Telephone Encounter (Signed)
Called patient with results of BLE duplex and ABIs. Will order referral to Dr. [name] Anon to see patient for PV. Patient verbalized understanding.

## 2021-09-21 NOTE — Telephone Encounter (Addendum)
-----   Message from [name], MD sent at 09/20/2021  3:14 PM EST ----- Bilateral total SFA occlusions refer to PV Arida/Berry  Moderate decrease in ABI refer to Berry/Arida for PV evaluation

## 2021-09-25 NOTE — Progress Notes (Signed)
Remote ICD transmission.   

## 2021-09-26 NOTE — Progress Notes (Signed)
No ICM remote transmission received for 09/24/2021 and next ICM transmission scheduled for 10/15/2021.   °

## 2021-10-15 ENCOUNTER — Ambulatory Visit (INDEPENDENT_AMBULATORY_CARE_PROVIDER_SITE_OTHER): Payer: Medicare Other

## 2021-10-15 DIAGNOSIS — Z95810 Presence of automatic (implantable) cardiac defibrillator: Secondary | ICD-10-CM

## 2021-10-15 DIAGNOSIS — I5022 Chronic systolic (congestive) heart failure: Secondary | ICD-10-CM

## 2021-10-17 NOTE — Progress Notes (Signed)
EPIC Encounter for ICM Monitoring  Patient Name: [name] is a 66 y.o. female Date: 10/17/2021 Primary Care Physican: Vicenta Aly, [place] Primary Cardiologist: Johnsie Cancel Electrophysiologist: Lovena Le Weight: 175 lbs (does not weigh at home)                                                            Spoke with patient and heart failure questions reviewed.  Pt asymptomatic for fluid accumulation.  Reports feeling well at this time and voices no complaints.   Pt will move monitor by beside for automatic reports.   Optivol thoracic impedance suggesting normal fluid levels.     Recommendations:  No changes and encouraged to call if experiencing any fluid symptoms.   Follow-up plan: ICM clinic phone appointment on 11/19/2021.   91 day device clinic remote transmission 12/14/2021.     EP/Cardiology Office Visits:  10/23/2021 with Dr [name] Anon. 02/21/2022 with Dr Johnsie Cancel.   Recall 10/27/2021 with Dr Lovena Le, Tommye Standard PA or Estrella Myrtle, [place].   Copy of ICM check sent to Dr. Lovena Le  3 month ICM trend: 10/12/2021.    12-14 Month ICM trend:     [name], RN 10/17/2021 1:17 PM

## 2021-10-23 ENCOUNTER — Other Ambulatory Visit: Payer: Self-pay

## 2021-10-23 ENCOUNTER — Encounter: Payer: Self-pay | Admitting: Cardiovascular Disease

## 2021-10-23 ENCOUNTER — Ambulatory Visit (INDEPENDENT_AMBULATORY_CARE_PROVIDER_SITE_OTHER): Payer: Medicare Other | Admitting: Cardiovascular Disease

## 2021-10-23 DIAGNOSIS — E785 Hyperlipidemia, unspecified: Secondary | ICD-10-CM | POA: Diagnosis not present

## 2021-10-23 DIAGNOSIS — I5022 Chronic systolic (congestive) heart failure: Secondary | ICD-10-CM | POA: Diagnosis not present

## 2021-10-23 DIAGNOSIS — I10 Essential (primary) hypertension: Secondary | ICD-10-CM

## 2021-10-23 DIAGNOSIS — I739 Peripheral vascular disease, unspecified: Secondary | ICD-10-CM | POA: Diagnosis not present

## 2021-10-23 MED ORDER — ATORVASTATIN CALCIUM 40 MG PO TABS: 40.0000 mg | ORAL_TABLET | Freq: Every day | ORAL | 3 refills | Status: DC

## 2021-10-23 NOTE — Progress Notes (Signed)
Cardiology Office Note   Date:  10/23/2021   ID:  [name], DOB 11/01/1955, MRN [id number]  PCP:  Vicenta Aly, FNP  Cardiologist:  Dr. Johnsie Cancel  Chief Complaint  Patient presents with   Follow-up      History of Present Illness: [name] is a 66 y.o. female who was referred by Dr. Johnsie Cancel for evaluation management of peripheral arterial disease. She has known history of coronary artery disease, chronic systolic heart failure status post ICD placement, COPD, tobacco use, COPD, hyperlipidemia and previous ventricular tachycardia.  The patient has prolonged history of prior peripheral arterial disease.  She was diagnosed with bilateral SFA occlusion in 2013.  She was seen by me at that time and given that her symptoms were not lifestyle limiting, I recommended continuing medical therapy and smoking cessation.  She struggled to quit smoking but ultimately was successful about 3 years ago. She has been doing reasonably well and denies any chest pain or worsening dyspnea.  She reports minimal bilateral calf claudication.  She underwent noninvasive vascular studies recently which showed an ABI of 0.74 on the right and 0.80 on the left.  Duplex showed evidence of bilateral SFA occlusion.   Past Medical History:  Diagnosis Date   Acute on chronic systolic heart failure ([place])    Acute respiratory failure (HCC)    Automatic implantable cardioverter-defibrillator in situ    CAD (coronary artery disease)    a. Inf-post MI 2008 s/p BMS to large marginal of Cx.    COPD (chronic obstructive pulmonary disease) ([place])    ETOH abuse    Hyperglycemia    HYPERLIPIDEMIA-MIXED 02/07/2009   HYPERTENSION, BENIGN 08/22/2009   PVD (peripheral vascular disease) ([place])    a. Evaluated by Dr. [name] Anon 08/2012.   SYSTOLIC HEART FAILURE, CHRONIC 02/07/2009   a. EF 30% 2010, 29% by MRI 08/2012. b. s/p prophylactic Medtronic ICD implantation 10/2012.   TIA (transient ischemic attack) 05/18/2014    Tobacco abuse    VENTRICULAR TACHYCARDIA 05/21/2010    Past Surgical History:  Procedure Laterality Date   CARDIAC DEFIBRILLATOR PLACEMENT  10/2012   CESAREAN SECTION  1977; 1982   COLONOSCOPY     CYSTOSCOPY W/ STONE MANIPULATION  1990's   ESOPHAGOGASTRODUODENOSCOPY (EGD) WITH PROPOFOL N/A 08/04/2018   Procedure: ESOPHAGOGASTRODUODENOSCOPY (EGD) WITH PROPOFOL;  Surgeon: Gatha Mayer, MD;  Location: [facility];  Service: Endoscopy;  Laterality: N/A;   IMPLANTABLE CARDIOVERTER DEFIBRILLATOR IMPLANT N/A 10/21/2012   Procedure: IMPLANTABLE CARDIOVERTER DEFIBRILLATOR IMPLANT;  Surgeon: Evans Lance, MD;  Location: [facility] CATH LAB;  Service: Cardiovascular;  Laterality: N/A;   RIGHT/LEFT HEART CATH AND CORONARY ANGIOGRAPHY N/A 03/26/2018   Procedure: RIGHT/LEFT HEART CATH AND CORONARY ANGIOGRAPHY;  Surgeon: Nelva Bush, MD;  Location: [place] CV LAB;  Service: Cardiovascular;  Laterality: N/A;     Current Outpatient Medications  Medication Sig Dispense Refill   albuterol (PROAIR HFA) 108 (90 Base) MCG/ACT inhaler Inhale 2 puffs into the lungs every 6 (six) hours as needed for wheezing or shortness of breath. 18 g 5   aspirin EC 81 MG tablet Take 81 mg by mouth daily.     carvedilol (COREG) 12.5 MG tablet Take 1 tablet (12.5 mg total) by mouth 2 (two) times daily with a meal. 180 tablet 3   cephALEXin (KEFLEX) 500 MG capsule Take 1 capsule (500 mg total) by mouth 4 (four) times daily. 28 capsule 0   feeding supplement, ENSURE COMPLETE, (ENSURE COMPLETE) LIQD Take 237 mLs  by mouth daily.     folic acid (FOLVITE) 1 MG tablet Take 1 mg by mouth daily.   1   nitroGLYCERIN (NITROSTAT) 0.4 MG SL tablet Place 1 tablet (0.4 mg total) under the tongue every 5 (five) minutes as needed for chest pain (up to 3 doses). 25 tablet 4   sacubitril-valsartan (ENTRESTO) 97-103 MG Take 1 tablet by mouth 2 (two) times daily. 180 tablet 3   spironolactone (ALDACTONE) 25 MG tablet Take 0.5 tablets (12.5 mg  total) by mouth daily. 45 tablet 3   No current facility-administered medications for this visit.    Allergies:   Clopidogrel bisulfate    Social History:  The patient  reports that she quit smoking about 3 years ago. Her smoking use included cigarettes. She has a 20.00 pack-year smoking history. She has never used smokeless tobacco. She reports current alcohol use of about 20.0 standard drinks per week. She reports that she does not use drugs.   Family History:  The patient's family history includes Asthma in her brother; Cancer in her mother; Heart attack in her father; Other in her brother.    ROS:  Please see the history of present illness.   Otherwise, review of systems are positive for none.   All other systems are reviewed and negative.    PHYSICAL EXAM: VS:  BP 120/72 (BP Location: Left Arm, Patient Position: Sitting, Cuff Size: Normal)    Pulse 67    Ht 5\' 9"  (1.753 m)    Wt 174 lb (78.9 kg)    BMI 25.70 kg/m  , BMI Body mass index is 25.7 kg/m. GEN: Well nourished, well developed, in no acute distress  HEENT: normal  Neck: no JVD, carotid bruits, or masses Cardiac: RRR; no murmurs, rubs, or gallops,no edema  Respiratory:  clear to auscultation bilaterally, normal work of breathing GI: soft, nontender, nondistended, + BS MS: no deformity or atrophy  Skin: warm and dry, no rash Neuro:  Strength and sensation are intact Psych: euthymic mood, full affect Distal pulses are not palpable.   EKG:  EKG is ordered today. The ekg ordered today demonstrates normal sinus rhythm with low voltage and left atrial enlargement.   Recent Labs: 11/24/2020: ALT 39; Hemoglobin 14.1; Platelets 138 08/24/2021: BUN 13; Creatinine, Ser 0.85; NT-Pro BNP 252; Potassium 4.7; Sodium 138    Lipid Panel    Component Value Date/Time   CHOL 130 03/23/2018 0900   CHOL 153 02/24/2017 1154   TRIG 66 03/23/2018 0900   HDL 39 (L) 03/23/2018 0900   HDL 74 02/24/2017 1154   CHOLHDL 3.3 03/23/2018  0900   VLDL 13 03/23/2018 0900   LDLCALC 78 03/23/2018 0900   LDLCALC 67 02/24/2017 1154      Wt Readings from Last 3 Encounters:  10/23/21 174 lb (78.9 kg)  08/13/21 175 lb (79.4 kg)  11/24/20 178 lb (80.7 kg)       No flowsheet data found.    ASSESSMENT AND PLAN:  1.  Peripheral arterial disease: The patient has chronic bilateral SFA occlusion which has been present since at least 2013.  Fortunately, her symptoms remain stable with minimal bilateral calf claudication that does not seem to be lifestyle limiting at this point.  I recommend continuing medical therapy.  I discussed with her the importance of daily walking.  2.  Chronic systolic heart failure: She appears to be euvolemic and currently on optimal medical therapy.  3.  Coronary artery disease involving native coronary arteries: She  denies anginal symptoms.  4.  Hyperlipidemia given history of coronary artery disease and peripheral arterial disease, there is strong indication for treatment with a statin regardless of lipid profile.  I added atorvastatin 40 mg daily.  Check lipid and liver profile in 2 months.  5.  Essential hypertension: Blood pressures well controlled.  6.  Previous tobacco use: She quit few years ago.    Disposition:   FU with me in 1 year  Signed,  Kathlyn Sacramento, MD  10/23/2021 11:08 AM    [place]

## 2021-10-23 NOTE — Patient Instructions (Addendum)
Medication Instructions:  START Atorvastatin 40 mg once daily  *If you need a refill on your cardiac medications before your next appointment, please call your pharmacy*   Lab Work: Your provider would like for you to return in 2 months to have the following labs drawn: Fasting lipid and liver. You do not need an appointment for the lab. Once in our office lobby there is a podium where you can sign in and ring the doorbell to alert [place] that you are here. The lab is open from 8:00 am to 4:30 pm; closed for lunch from 12:45pm-1:45pm.  If you have labs (blood work) drawn today and your tests are completely normal, you will receive your results only by: [place] (if you have MyChart) OR A paper copy in the mail If you have any lab test that is abnormal or we need to change your treatment, we will call you to review the results.   Testing/Procedures: None ordered   Follow-Up: At [facility], you and your health needs are our priority.  As part of our continuing mission to provide you with exceptional heart care, we have created designated Provider Care Teams.  These Care Teams include your primary Cardiologist (physician) and Advanced Practice Providers (APPs -  Physician Assistants and Nurse Practitioners) who all work together to provide you with the care you need, when you need it.  We recommend signing up for the patient portal called "MyChart".  Sign up information is provided on this After Visit Summary.  MyChart is used to connect with patients for Virtual Visits (Telemedicine).  Patients are able to view lab/test results, encounter notes, upcoming appointments, etc.  Non-urgent messages can be sent to your provider as well.   To learn more about what you can do with MyChart, go to [email or web address].    Your next appointment:   12 month(s)  The format for your next appointment:   In Person  Provider:   Dr. [name] Anon

## 2021-11-19 ENCOUNTER — Ambulatory Visit (INDEPENDENT_AMBULATORY_CARE_PROVIDER_SITE_OTHER): Payer: Medicare Other

## 2021-11-19 DIAGNOSIS — I5022 Chronic systolic (congestive) heart failure: Secondary | ICD-10-CM

## 2021-11-19 DIAGNOSIS — Z95810 Presence of automatic (implantable) cardiac defibrillator: Secondary | ICD-10-CM

## 2021-11-22 ENCOUNTER — Telehealth: Payer: Self-pay

## 2021-11-22 NOTE — Telephone Encounter (Signed)
I spoke with the patient and she agreed to send missed ICM transmission. ?

## 2021-11-23 ENCOUNTER — Telehealth: Payer: Self-pay

## 2021-11-23 NOTE — Progress Notes (Signed)
EPIC Encounter for ICM Monitoring ? ?Patient Name: [name] is a 66 y.o. female ?Date: 11/23/2021 ?Primary Care Physican: Vicenta Aly, FNP ?Primary Cardiologist: Johnsie Cancel ?Electrophysiologist: Lovena Le ?Weight: 175 lbs (does not weigh at home) ?                                                         ?  ?Attempted call to patient and unable to reach.  Left detailed message per DPR regarding transmission. Transmission reviewed.  ?  ?Optivol thoracic impedance suggesting normal fluid levels.   ?  ?Recommendations:  Left voice mail with ICM number and encouraged to call if experiencing any fluid symptoms. ?  ?Follow-up plan: ICM clinic phone appointment on 12/24/2021.   91 day device clinic remote transmission 12/14/2021.   ?  ?EP/Cardiology Office Visits:  . 02/21/2022 with Dr Johnsie Cancel.   Recall 10/27/2021 with Dr Lovena Le, Tommye Standard PA or Estrella Myrtle, [place]. ?  ?Copy of ICM check sent to Dr. Lovena Le ? ?3 month ICM trend: 11/23/2021. ? ? ? ?12-14 Month ICM trend:  ? ? ? ?[name], RN ?11/23/2021 ?10:57 AM ? ?

## 2021-11-23 NOTE — Telephone Encounter (Signed)
Remote ICM transmission received.  Attempted call to patient regarding ICM remote transmission and left detailed message per DPR.  Advised to return call for any fluid symptoms or questions. Next ICM remote transmission scheduled 12/24/2021.  ] ?

## 2021-12-02 ENCOUNTER — Emergency Department (HOSPITAL_COMMUNITY): Payer: Medicare Other

## 2021-12-02 ENCOUNTER — Other Ambulatory Visit: Payer: Self-pay

## 2021-12-02 ENCOUNTER — Emergency Department (HOSPITAL_COMMUNITY)
Admission: EM | Admit: 2021-12-02 | Discharge: 2021-12-02 | Disposition: A | Discharge status: Home / Self Care | Payer: Medicare Other | Attending: Emergency Medicine | Admitting: Emergency Medicine

## 2021-12-02 DIAGNOSIS — J449 Chronic obstructive pulmonary disease, unspecified: Secondary | ICD-10-CM | POA: Insufficient documentation

## 2021-12-02 DIAGNOSIS — I70293 Other atherosclerosis of native arteries of extremities, bilateral legs: Secondary | ICD-10-CM | POA: Insufficient documentation

## 2021-12-02 DIAGNOSIS — R202 Paresthesia of skin: Secondary | ICD-10-CM | POA: Diagnosis not present

## 2021-12-02 DIAGNOSIS — R252 Cramp and spasm: Secondary | ICD-10-CM | POA: Diagnosis not present

## 2021-12-02 DIAGNOSIS — K5730 Diverticulosis of large intestine without perforation or abscess without bleeding: Secondary | ICD-10-CM | POA: Diagnosis not present

## 2021-12-02 DIAGNOSIS — Z7982 Long term (current) use of aspirin: Secondary | ICD-10-CM | POA: Insufficient documentation

## 2021-12-02 DIAGNOSIS — I509 Heart failure, unspecified: Secondary | ICD-10-CM | POA: Insufficient documentation

## 2021-12-02 DIAGNOSIS — M545 Low back pain, unspecified: Secondary | ICD-10-CM | POA: Insufficient documentation

## 2021-12-02 DIAGNOSIS — Z79899 Other long term (current) drug therapy: Secondary | ICD-10-CM | POA: Diagnosis not present

## 2021-12-02 DIAGNOSIS — K5790 Diverticulosis of intestine, part unspecified, without perforation or abscess without bleeding: Secondary | ICD-10-CM

## 2021-12-02 LAB — CBC WITH DIFFERENTIAL/PLATELET
Abs Immature Granulocytes: 0 10*3/uL (ref 0.00–0.07)
Basophils Absolute: 0 10*3/uL (ref 0.0–0.1)
Basophils Relative: 1 %
Eosinophils Absolute: 0.2 10*3/uL (ref 0.0–0.5)
Eosinophils Relative: 3 %
HCT: 36.6 % (ref 36.0–46.0)
Hemoglobin: 12.7 g/dL (ref 12.0–15.0)
Immature Granulocytes: 0 %
Lymphocytes Relative: 52 %
Lymphs Abs: 2.8 10*3/uL (ref 0.7–4.0)
MCH: 32.8 pg (ref 26.0–34.0)
MCHC: 34.7 g/dL (ref 30.0–36.0)
MCV: 94.6 fL (ref 80.0–100.0)
Monocytes Absolute: 0.5 10*3/uL (ref 0.1–1.0)
Monocytes Relative: 9 %
Neutro Abs: 1.9 10*3/uL (ref 1.7–7.7)
Neutrophils Relative %: 35 %
Platelets: 87 10*3/uL — ABNORMAL LOW (ref 150–400)
RBC: 3.87 MIL/uL (ref 3.87–5.11)
RDW: 13.3 % (ref 11.5–15.5)
WBC: 5.5 10*3/uL (ref 4.0–10.5)
nRBC: 0 % (ref 0.0–0.2)

## 2021-12-02 LAB — COMPREHENSIVE METABOLIC PANEL
ALT: 64 U/L — ABNORMAL HIGH (ref 0–44)
AST: 167 U/L — ABNORMAL HIGH (ref 15–41)
Albumin: 3.5 g/dL (ref 3.5–5.0)
Alkaline Phosphatase: 118 U/L (ref 38–126)
Anion gap: 10 (ref 5–15)
BUN: 12 mg/dL (ref 8–23)
CO2: 21 mmol/L — ABNORMAL LOW (ref 22–32)
Calcium: 9.1 mg/dL (ref 8.9–10.3)
Chloride: 105 mmol/L (ref 98–111)
Creatinine, Ser: 0.77 mg/dL (ref 0.44–1.00)
GFR, Estimated: >60 mL/min (ref >60)
Glucose, Bld: 111 mg/dL — ABNORMAL HIGH (ref 70–99)
Potassium: 4.4 mmol/L (ref 3.5–5.1)
Sodium: 136 mmol/L (ref 135–145)
Total Bilirubin: 1.6 mg/dL — ABNORMAL HIGH (ref 0.3–1.2)
Total Protein: 7.4 g/dL (ref 6.5–8.1)

## 2021-12-02 LAB — MAGNESIUM: Magnesium: 1.5 mg/dL — ABNORMAL LOW (ref 1.7–2.4)

## 2021-12-02 MED ORDER — IOHEXOL 350 MG/ML SOLN
100.0000 mL | Freq: Once | INTRAVENOUS | Status: AC | PRN
  Administered 2021-12-02: 100 mL via INTRAVENOUS

## 2021-12-02 MED ORDER — MAGNESIUM OXIDE -MG SUPPLEMENT 400 (240 MG) MG PO TABS
200.0000 mg | ORAL_TABLET | Freq: Once | ORAL | Status: AC
  Administered 2021-12-02: 200 mg via ORAL
  Filled 2021-12-02: qty 1

## 2021-12-02 MED ORDER — LACTATED RINGERS IV BOLUS
250.0000 mL | Freq: Once | INTRAVENOUS | Status: AC
  Administered 2021-12-02: 250 mL via INTRAVENOUS

## 2021-12-02 NOTE — ED Notes (Signed)
Patient transported to X-ray 

## 2021-12-02 NOTE — Discharge Instructions (Addendum)
Today your magnesium was minimally low. ?The scan on your main vessel coming off your heart, also called your aorta, was reassuring. ?Please follow-up with your primary care doctor and your cardiologist regarding your ED visit and symptoms today. ?If your symptoms change, they become constant lasting more than 20 to 30 minutes, or you develop any pain in your chest, abdomen, any new back pain, you feel like when your arms or legs is very cold or have any new or concerning symptoms please seek additional medical care and evaluation. ? ?Please make sure you are drinking extra water and staying well-hydrated over the next few days to help your muscle cramps and to help your body flush out the contrast that you got during your CT scan today. ? ?Your CT scan did show something called diverticulosis.  This condition does not cause pain and is not causing any symptoms right now.  I have given you some information to read on this. ?

## 2021-12-02 NOTE — ED Provider Notes (Signed)
?[place] ?Provider Note ? ? ?CSN: [id number] ?Arrival date & time: 12/02/21  1828 ? ?  ? ?History ? ?Chief Complaint  ?Patient presents with  ? Extremity Pain  ? ? ?[name] is a 66 y.o. 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  She states that this morning at about 10 while she was eating breakfast she had a episode where she had severe paresthesias and pain exacerbated by any movement in her left arm and left leg.  The symptoms in her left arm happened for 15 minutes and then resolved, her left leg was 30 minutes and then resolved.  She reports that she has some new left-sided lower back pain.  She denies any fevers, recent sickness or illness.  No chest pain or shortness of breath.  She reports compliance with all of her medications. ? ?She states that she has had brief periods where she feels like she is having similar symptoms in her right leg also, however these last for under a minute and go away. ? ?HPI ? ?  ? ?Home Medications ?Prior to Admission medications   ?Medication Sig Start Date End Date Taking? Authorizing Provider  ?albuterol (PROAIR HFA) 108 (90 Base) MCG/ACT inhaler Inhale 2 puffs into the lungs every 6 (six) hours as needed for wheezing or shortness of breath. 03/28/20   Brand Males, MD  ?aspirin EC 81 MG tablet Take 81 mg by mouth daily.    [provider]  ?atorvastatin (LIPITOR) 40 MG tablet Take 1 tablet (40 mg total) by mouth daily. 10/23/21 01/21/22  Wellington Hampshire, MD  ?carvedilol (COREG) 12.5 MG tablet Take 1 tablet (12.5 mg total) by mouth 2 (two) times daily with a meal. 08/13/21   [name], MD  ?cephALEXin (KEFLEX) 500 MG capsule Take 1 capsule (500 mg total) by mouth 4 (four) times daily. 11/24/20   Valarie Merino, MD  ?feeding supplement, ENSURE  COMPLETE, (ENSURE COMPLETE) LIQD Take 237 mLs by mouth daily. 05/19/14   Cristal Ford, DO  ?folic acid (FOLVITE) 1 MG tablet Take 1 mg by mouth daily.  05/08/18   [provider]  ?nitroGLYCERIN (NITROSTAT) 0.4 MG SL tablet Place 1 tablet (0.4 mg total) under the tongue every 5 (five) minutes as needed for chest pain (up to 3 doses). 11/03/17   [name], MD  ?sacubitril-valsartan (ENTRESTO) 97-103 MG Take 1 tablet by mouth 2 (two) times daily. 08/13/21   [name], MD  ?spironolactone (ALDACTONE) 25 MG tablet Take 0.5 tablets (12.5 mg total) by mouth daily. 08/13/21   [name], MD  ?   ? ?Allergies    ?Clopidogrel bisulfate   ? ?Review of Systems   ?Review of Systems ? ?Physical Exam ?Updated Vital Signs ?BP 120/68   Pulse 85   Temp 98 ?F (36.7 ?C)   Resp 16   Ht '5\' 9"'$  (1.753 m)   Wt 79.4 kg   SpO2 100%   BMI 25.84 kg/m?  ?Physical Exam ?Vitals and nursing note reviewed.  ?Constitutional:   ?   General: She is not in acute distress. ?   Appearance: She is not ill-appearing.  ?HENT:  ?   Head: Normocephalic and atraumatic.  ?Eyes:  ?   Conjunctiva/sclera: Conjunctivae normal.  ?Cardiovascular:  ?   Rate and Rhythm: Normal  rate.  ?   Pulses:     ?     Radial pulses are 2+ on the right side and 2+ on the left side.  ?     Dorsalis pedis pulses are detected w/ Doppler on the right side and detected w/ Doppler on the left side.  ?     Posterior tibial pulses are 0 on the right side and 0 on the left side.  ?   Heart sounds: Murmur heard.  ?Pulmonary:  ?   Effort: Pulmonary effort is normal. No respiratory distress.  ?   Breath sounds: Normal breath sounds.  ?Abdominal:  ?   General: There is no distension.  ?   Tenderness: There is no abdominal tenderness. There is no guarding.  ?Musculoskeletal:  ?   Cervical back: Normal range of motion and neck supple.  ?   Comments: TTP over the left lateral low back.   ?Skin: ?   General: Skin is warm and dry.  ?Neurological:  ?   Mental Status:  She is alert.  ?   Sensory: No sensory deficit (Sensation intact to light touch to bilateral arms and legs.).  ?   Comments: Awake and alert, answers all questions appropriately.  Speech is not slurred.  ?Psychiatric:     ?   Mood and Affect: Mood normal.     ?   Behavior: Behavior normal.  ? ? ?ED Results / Procedures / Treatments   ?Labs ?(all labs ordered are listed, but only abnormal results are displayed) ?Labs Reviewed  ?COMPREHENSIVE METABOLIC PANEL - Abnormal; Notable for the following components:  ?    Result Value  ? CO2 21 (*)   ? Glucose, Bld 111 (*)   ? AST 167 (*)   ? ALT 64 (*)   ? Total Bilirubin 1.6 (*)   ? All other components within normal limits  ?CBC WITH DIFFERENTIAL/PLATELET - Abnormal; Notable for the following components:  ? Platelets 87 (*)   ? All other components within normal limits  ?MAGNESIUM - Abnormal; Notable for the following components:  ? Magnesium 1.5 (*)   ? All other components within normal limits  ? ? ?EKG ?None ? ?Radiology ?DG Chest 2 View ? ?Result Date: 12/02/2021 ?CLINICAL DATA:  Paresthesias EXAM: CHEST - 2 VIEW COMPARISON:  03/30/2020 FINDINGS: Heart size is normal. Left chest single lead pacer defibrillator. Both lungs are clear. The visualized skeletal structures are unremarkable. IMPRESSION: No acute abnormality of the lungs. Electronically Signed   By: Delanna Ahmadi M.D.   On: 12/02/2021 20:10  ? ?CT Angio Chest/Abd/Pel for Dissection W and/or Wo Contrast ? ?Result Date: 12/02/2021 ?CLINICAL DATA:  Left-sided back pain.  Claudication. EXAM: CT ANGIOGRAPHY CHEST, ABDOMEN AND PELVIS TECHNIQUE: Non-contrast CT of the chest was initially obtained. Multidetector CT imaging through the chest, abdomen and pelvis was performed using the standard protocol during bolus administration of intravenous contrast. Multiplanar reconstructed images and MIPs were obtained and reviewed to evaluate the vascular anatomy. RADIATION DOSE REDUCTION: This exam was performed according to the  departmental dose-optimization program which includes automated exposure control, adjustment of the mA and/or kV according to patient size and/or use of iterative reconstruction technique. CONTRAST:  155m OMNIPAQUE IOHEXOL 350 MG/ML SOLN COMPARISON:  Chest CT dated 04/10/2020. FINDINGS: CTA CHEST FINDINGS Cardiovascular: There is no cardiomegaly or pericardial effusion. Left pectoral pacemaker device. There is mild atherosclerotic calcification of the thoracic aorta. No aneurysmal dilatation or dissection. The origins of the great vessels  of the aortic arch appear patent as visualized. No pulmonary artery embolus identified. Mediastinum/Nodes: There is no hilar or mediastinal adenopathy. Mild circumferential thickening of the distal esophagus may be related to underdistention. Mild esophagitis is not excluded clinical correlation is recommended. No mediastinal fluid collection. Lungs/Pleura: Background of emphysema. No focal consolidation, pleural effusion, or pneumothorax. The central airways are patent. Musculoskeletal: No acute osseous pathology. Review of the MIP images confirms the above findings. CTA ABDOMEN AND PELVIS FINDINGS VASCULAR Aorta: Moderate atherosclerotic calcification of the abdominal aorta. No aneurysmal dilatation or dissection. No periaortic fluid collection. Celiac: Focal area of narrowing of the proximal celiac trunk, likely related to compression by the diaphragmatic crus. The celiac artery and its major branches remain patent. SMA: Patent without evidence of aneurysm, dissection, vasculitis or significant stenosis. Renals: There is atherosclerotic calcification the origins of the renal arteries. The renal arteries patent. IMA: Patent without evidence of aneurysm, dissection, vasculitis or significant stenosis. Inflow: Moderate atherosclerotic calcification the iliac arteries. The iliac arteries remain patent. No aneurysmal dilatation or dissection. Veins: No obvious venous abnormality  within the limitations of this arterial phase study. Review of the MIP images confirms the above findings. NON-VASCULAR No intra-abdominal free air or free fluid. Hepatobiliary: Mild irregularity of the liver cont

## 2021-12-02 NOTE — ED Triage Notes (Signed)
Pt bib [facility] from home c/o extremity pain and left lateral lumbar pain. More pain in her legs than arms. Pain started early this am and got worse throughout the day. Pt describes the pain as cramping, 9/10 pain during transport . Pt does have a [employer] ICD defibrillator, and previous MI. ? ?EMS ?110/64 BP ?63 HR ?20 RR ?97% RA ?182 CBG ?

## 2021-12-14 ENCOUNTER — Ambulatory Visit (INDEPENDENT_AMBULATORY_CARE_PROVIDER_SITE_OTHER): Payer: Medicare Other

## 2021-12-14 DIAGNOSIS — I428 Other cardiomyopathies: Secondary | ICD-10-CM

## 2021-12-14 DIAGNOSIS — I5022 Chronic systolic (congestive) heart failure: Secondary | ICD-10-CM

## 2021-12-17 LAB — CUP PACEART REMOTE DEVICE CHECK
Battery Remaining Longevity: 31 mo
Battery Voltage: 2.95 V
Brady Statistic RV Percent Paced: 0.05 %
Date Time Interrogation Session: 20230407091607
HighPow Impedance: 77 Ohm
Implantable Lead Implant Date: 20140212
Implantable Lead Location: 753860
Implantable Lead Model: 6935
Implantable Pulse Generator Implant Date: 20140212
Lead Channel Impedance Value: 456 Ohm
Lead Channel Impedance Value: 532 Ohm
Lead Channel Pacing Threshold Amplitude: 0.5 V
Lead Channel Pacing Threshold Pulse Width: 0.4 ms
Lead Channel Sensing Intrinsic Amplitude: 9.125 mV
Lead Channel Sensing Intrinsic Amplitude: 9.125 mV
Lead Channel Setting Pacing Amplitude: 2.5 V
Lead Channel Setting Pacing Pulse Width: 0.4 ms
Lead Channel Setting Sensing Sensitivity: 0.3 mV

## 2021-12-24 ENCOUNTER — Ambulatory Visit (INDEPENDENT_AMBULATORY_CARE_PROVIDER_SITE_OTHER): Payer: Medicare Other

## 2021-12-24 DIAGNOSIS — I5022 Chronic systolic (congestive) heart failure: Secondary | ICD-10-CM

## 2021-12-24 DIAGNOSIS — Z95810 Presence of automatic (implantable) cardiac defibrillator: Secondary | ICD-10-CM

## 2021-12-28 NOTE — Progress Notes (Signed)
EPIC Encounter for ICM Monitoring ? ?Patient Name: [name] is a 66 y.o. female ?Date: 12/28/2021 ?Primary Care Physican: Pcp, No ?Primary Cardiologist: Nishan ?Electrophysiologist: Lovena Le ?12/28/2021 Weight: 175 lbs  ?                                                         ?  ?Spoke with patient and heart failure questions reviewed.  Pt asymptomatic for fluid accumulation.  Reports feeling well at this time and voices no complaints.  ?  ?Optivol thoracic impedance suggesting intermittent days with possible fluid accumulation.   ?  ?Recommendations:  Recommendation to limit salt intake to 2000 mg daily and fluid intake to 64 oz daily.  Encouraged to call if experiencing any fluid symptoms.  ?  ?Follow-up plan: ICM clinic phone appointment on 01/28/2022.   91 day device clinic remote transmission 03/15/2022.   ?  ?EP/Cardiology Office Visits:  02/21/2022 with Dr Johnsie Cancel.   Recall 10/27/2021 with Dr Lovena Le, Tommye Standard PA or Estrella Myrtle, [place]. ?  ?Copy of ICM check sent to Dr. Lovena Le ?  ?3 month ICM trend: 12/25/2021. ? ? ? ?12-14 Month ICM trend:  ? ? ? ?[name], RN ?12/28/2021 ?2:21 PM ? ?

## 2021-12-31 NOTE — Progress Notes (Signed)
Remote ICD transmission.   

## 2022-01-15 ENCOUNTER — Telehealth: Payer: Self-pay | Admitting: Internal Medicine

## 2022-01-15 NOTE — Telephone Encounter (Signed)
Scheduled appt per 5/4 referral. Pt is aware of appt date and time. Pt is aware to arrive 15 mins prior to appt time and to bring and updated insurance card. Pt is aware of appt location.   ?

## 2022-01-28 ENCOUNTER — Other Ambulatory Visit: Payer: Self-pay | Admitting: Internal Medicine

## 2022-01-28 ENCOUNTER — Ambulatory Visit (INDEPENDENT_AMBULATORY_CARE_PROVIDER_SITE_OTHER): Payer: Medicare Other

## 2022-01-28 DIAGNOSIS — D539 Nutritional anemia, unspecified: Secondary | ICD-10-CM

## 2022-01-28 DIAGNOSIS — I5022 Chronic systolic (congestive) heart failure: Secondary | ICD-10-CM | POA: Diagnosis not present

## 2022-01-28 DIAGNOSIS — Z95810 Presence of automatic (implantable) cardiac defibrillator: Secondary | ICD-10-CM | POA: Diagnosis not present

## 2022-01-29 ENCOUNTER — Inpatient Hospital Stay: Payer: Medicare Other | Attending: Internal Medicine | Admitting: Internal Medicine

## 2022-01-29 ENCOUNTER — Encounter: Payer: Self-pay | Admitting: Internal Medicine

## 2022-01-29 ENCOUNTER — Inpatient Hospital Stay: Payer: Medicare Other

## 2022-01-29 DIAGNOSIS — K7460 Unspecified cirrhosis of liver: Secondary | ICD-10-CM | POA: Insufficient documentation

## 2022-01-29 DIAGNOSIS — I5022 Chronic systolic (congestive) heart failure: Secondary | ICD-10-CM | POA: Insufficient documentation

## 2022-01-29 DIAGNOSIS — I110 Hypertensive heart disease with heart failure: Secondary | ICD-10-CM | POA: Insufficient documentation

## 2022-01-29 DIAGNOSIS — R772 Abnormality of alphafetoprotein: Secondary | ICD-10-CM | POA: Insufficient documentation

## 2022-01-29 DIAGNOSIS — D539 Nutritional anemia, unspecified: Secondary | ICD-10-CM

## 2022-01-29 LAB — RETICULOCYTES
Immature Retic Fract: 27.5 % — ABNORMAL HIGH (ref 2.3–15.9)
RBC.: 3.35 MIL/uL — ABNORMAL LOW (ref 3.87–5.11)
Retic Count, Absolute: 93.5 10*3/uL (ref 19.0–186.0)
Retic Ct Pct: 2.8 % (ref 0.4–3.1)

## 2022-01-29 LAB — CBC WITH DIFFERENTIAL (CANCER CENTER ONLY)
Abs Immature Granulocytes: 0 10*3/uL (ref 0.00–0.07)
Basophils Absolute: 0.1 10*3/uL (ref 0.0–0.1)
Basophils Relative: 1 %
Eosinophils Absolute: 0.4 10*3/uL (ref 0.0–0.5)
Eosinophils Relative: 6 %
HCT: 33.4 % — ABNORMAL LOW (ref 36.0–46.0)
Hemoglobin: 11.4 g/dL — ABNORMAL LOW (ref 12.0–15.0)
Immature Granulocytes: 0 %
Lymphocytes Relative: 49 %
Lymphs Abs: 2.8 10*3/uL (ref 0.7–4.0)
MCH: 34.1 pg — ABNORMAL HIGH (ref 26.0–34.0)
MCHC: 34.1 g/dL (ref 30.0–36.0)
MCV: 100 fL (ref 80.0–100.0)
Monocytes Absolute: 0.5 10*3/uL (ref 0.1–1.0)
Monocytes Relative: 8 %
Neutro Abs: 2.1 10*3/uL (ref 1.7–7.7)
Neutrophils Relative %: 36 %
Platelet Count: 112 10*3/uL — ABNORMAL LOW (ref 150–400)
RBC: 3.34 MIL/uL — ABNORMAL LOW (ref 3.87–5.11)
RDW: 14.7 % (ref 11.5–15.5)
WBC Count: 5.7 10*3/uL (ref 4.0–10.5)
nRBC: 0 % (ref 0.0–0.2)

## 2022-01-29 LAB — IRON AND IRON BINDING CAPACITY (CC-WL,HP ONLY)
Iron: 173 ug/dL — ABNORMAL HIGH (ref 28–170)
Saturation Ratios: 63 % — ABNORMAL HIGH (ref 10.4–31.8)
TIBC: 273 ug/dL (ref 250–450)
UIBC: 100 ug/dL — ABNORMAL LOW (ref 148–442)

## 2022-01-29 LAB — FERRITIN: Ferritin: 1422 ng/mL — ABNORMAL HIGH (ref 11–307)

## 2022-01-29 LAB — LACTATE DEHYDROGENASE: LDH: 222 U/L — ABNORMAL HIGH (ref 98–192)

## 2022-01-29 LAB — CMP (CANCER CENTER ONLY)
ALT: 33 U/L (ref 0–44)
AST: 74 U/L — ABNORMAL HIGH (ref 15–41)
Albumin: 3.6 g/dL (ref 3.5–5.0)
Alkaline Phosphatase: 119 U/L (ref 38–126)
Anion gap: 6 (ref 5–15)
BUN: 7 mg/dL — ABNORMAL LOW (ref 8–23)
CO2: 26 mmol/L (ref 22–32)
Calcium: 9.2 mg/dL (ref 8.9–10.3)
Chloride: 107 mmol/L (ref 98–111)
Creatinine: 0.82 mg/dL (ref 0.44–1.00)
GFR, Estimated: >60 mL/min (ref >60)
Glucose, Bld: 110 mg/dL — ABNORMAL HIGH (ref 70–99)
Potassium: 3.7 mmol/L (ref 3.5–5.1)
Sodium: 139 mmol/L (ref 135–145)
Total Bilirubin: 0.7 mg/dL (ref 0.3–1.2)
Total Protein: 7.7 g/dL (ref 6.5–8.1)

## 2022-01-29 LAB — FOLATE: Folate: 6.8 ng/mL (ref >5.9)

## 2022-01-29 LAB — VITAMIN B12: Vitamin B-12: 268 pg/mL (ref 180–914)

## 2022-01-29 NOTE — Progress Notes (Signed)
EPIC Encounter for ICM Monitoring  Patient Name: [name] is a 66 y.o. female Date: 01/29/2022 Primary Care Physican: Berna Bue Primary Cardiologist: Johnsie Cancel Electrophysiologist: Lovena Le 12/28/2021 Weight: 175 lbs  01/29/2022 Weight: 175 lbs                                                            Spoke with patient and heart failure questions reviewed.  Pt asymptomatic for fluid accumulation.  Pt eats foods high in salt such as cheese, saltine crackers and other snacks.     Optivol thoracic impedance suggesting possible fluid accumulation starting mid April and trending back to close baseline.     Recommendations:  Recommendation to limit salt intake to 2000 mg daily and fluid intake to 64 oz daily.  Encouraged to call if experiencing any fluid symptoms.    Follow-up plan: ICM clinic phone appointment on 02/05/2022 to recheck fluid levels.   91 day device clinic remote transmission 03/15/2022.     EP/Cardiology Office Visits:  02/21/2022 with Dr Johnsie Cancel.   Recall 10/27/2021 with Dr Lovena Le, Tommye Standard PA or Estrella Myrtle, [place].   Copy of ICM check sent to Dr. Lovena Le and Dr Johnsie Cancel for review.  3 month ICM trend: 01/28/2022.    12-14 Month ICM trend:     [name], RN 01/29/2022 4:48 PM

## 2022-01-29 NOTE — Progress Notes (Signed)
[place] [phone]   [phone]  CONSULT NOTE  REFERRING PHYSICIAN: Aurelio Jew, PA-C  REASON FOR CONSULTATION:  66 years old African-American female with iron overload.  HPI [name] is a 66 y.o. female with past medical history significant for multiple medical problems including congestive heart failure, coronary artery disease, COPD, alcohol abuse, dyslipidemia, hyperglycemia, hypertension, peripheral vascular disease, ventricular tachycardia as well as TIA.  The patient was seen by her primary care provider for routine evaluation and during her work-up she was complaining of thinning of her hair and she had several blood work performed including thyroid panel as well as iron study and ferritin.  Her iron studies showed elevated serum iron of 190 with iron saturation of 88% and ferritin level was 2679 with TIBC of 215.  Comprehensive metabolic panel at that time also showed elevated liver enzymes with alkaline phosphatase of 171, AST of 117 and ALT of 44.  The patient also had AFP of 14.1.  Her CBC showed mild anemia with hemoglobin of 11.0 and hematocrit 33.2 and low platelets count of 106,000.  The patient was also found to have elevated smooth muscle antibody of 64.  Hepatitis panel was negative.  She had hemochromatosis gene analysis on 01/07/2022 that showed heterozygous H63D with no other abnormalities.  Because of the iron overload the patient was referred to me today for evaluation and recommendation regarding her condition.  She was also referred to gastroenterology for consideration of colonoscopy.  She is also scheduled to have ultrasound-guided liver biopsy because of her liver cirrhosis and elevated AFP. When seen today she she is feeling fine with no concerning complaints.  She denied having any current chest pain, shortness of breath, cough or hemoptysis.  She denied having any fever or chills.  She has no nausea, vomiting, diarrhea  or constipation.  She has no headache or visual changes. Family history significant for mother with cervical cancer.  Father had heart disease.  Brother had metastatic neuroendocrine tumor. The patient is single and has 2 daughters.  She was accompanied by her friend [name].  She used to work in [employer]  She has a history of smoking more than 1 pack/day for around 45 years and quit 3 years ago.  She also drinks large amount of alcohol every day.  She has no history of drug abuse.  HPI  Past Medical History:  Diagnosis Date   Acute on chronic systolic heart failure (HCC)    Acute respiratory failure (HCC)    Automatic implantable cardioverter-defibrillator in situ    CAD (coronary artery disease)    a. Inf-post MI 2008 s/p BMS to large marginal of Cx.    COPD (chronic obstructive pulmonary disease) ([place])    ETOH abuse    Hyperglycemia    HYPERLIPIDEMIA-MIXED 02/07/2009   HYPERTENSION, BENIGN 08/22/2009   PVD (peripheral vascular disease) ([place])    a. Evaluated by Dr. [name] Anon 08/2012.   SYSTOLIC HEART FAILURE, CHRONIC 02/07/2009   a. EF 30% 2010, 29% by MRI 08/2012. b. s/p prophylactic Medtronic ICD implantation 10/2012.   TIA (transient ischemic attack) 05/18/2014   Tobacco abuse    VENTRICULAR TACHYCARDIA 05/21/2010    Past Surgical History:  Procedure Laterality Date   CARDIAC DEFIBRILLATOR PLACEMENT  10/2012   CESAREAN SECTION  1977; 1982   COLONOSCOPY     CYSTOSCOPY W/ STONE MANIPULATION  1990's   ESOPHAGOGASTRODUODENOSCOPY (EGD) WITH PROPOFOL N/A 08/04/2018   Procedure: ESOPHAGOGASTRODUODENOSCOPY (EGD) WITH PROPOFOL;  Surgeon: Gatha Mayer, MD;  Location: [facility];  Service: Endoscopy;  Laterality: N/A;   IMPLANTABLE CARDIOVERTER DEFIBRILLATOR IMPLANT N/A 10/21/2012   Procedure: IMPLANTABLE CARDIOVERTER DEFIBRILLATOR IMPLANT;  Surgeon: Evans Lance, MD;  Location: [facility] CATH LAB;  Service: Cardiovascular;  Laterality: N/A;   RIGHT/LEFT HEART CATH AND CORONARY ANGIOGRAPHY N/A  03/26/2018   Procedure: RIGHT/LEFT HEART CATH AND CORONARY ANGIOGRAPHY;  Surgeon: Nelva Bush, MD;  Location: [place] CV LAB;  Service: Cardiovascular;  Laterality: N/A;    Family History  Problem Relation Age of Onset   Cancer Mother        cervical   Heart attack Father    Asthma Brother    Other Brother        Pacemaker    Social History Social History   Tobacco Use   Smoking status: Former    Packs/day: 0.50    Years: 40.00    Pack years: 20.00    Types: Cigarettes    Quit date: 04/27/2018    Years since quitting: 3.7   Smokeless tobacco: Never   Tobacco comments:    pt is on nicotine patches   Vaping Use   Vaping Use: Never used  Substance Use Topics   Alcohol use: Yes    Alcohol/week: 20.0 standard drinks    Types: 20 Cans of beer per week    Comment: 05/18/2014 "2, 40's qod"; 1 beer last week    Drug use: No    Allergies  Allergen Reactions   Clopidogrel Bisulfate Rash    Current Outpatient Medications  Medication Sig Dispense Refill   Misc. Devices MISC Use scale daily to monitor weight.     albuterol (PROAIR HFA) 108 (90 Base) MCG/ACT inhaler Inhale 2 puffs into the lungs every 6 (six) hours as needed for wheezing or shortness of breath. 18 g 5   aspirin EC 81 MG tablet Take 81 mg by mouth daily.     atorvastatin (LIPITOR) 40 MG tablet Take 1 tablet (40 mg total) by mouth daily. 90 tablet 3   carvedilol (COREG) 12.5 MG tablet Take 1 tablet (12.5 mg total) by mouth 2 (two) times daily with a meal. 180 tablet 3   cephALEXin (KEFLEX) 500 MG capsule Take 1 capsule (500 mg total) by mouth 4 (four) times daily. 28 capsule 0   feeding supplement, ENSURE COMPLETE, (ENSURE COMPLETE) LIQD Take 237 mLs by mouth daily.     folic acid (FOLVITE) 1 MG tablet Take 1 mg by mouth daily.   1   levothyroxine (SYNTHROID) 25 MCG tablet Take 25 mcg by mouth daily.     nitroGLYCERIN (NITROSTAT) 0.4 MG SL tablet Place 1 tablet (0.4 mg total) under the tongue every 5 (five)  minutes as needed for chest pain (up to 3 doses). 25 tablet 4   sacubitril-valsartan (ENTRESTO) 97-103 MG Take 1 tablet by mouth 2 (two) times daily. 180 tablet 3   spironolactone (ALDACTONE) 25 MG tablet Take 0.5 tablets (12.5 mg total) by mouth daily. 45 tablet 3   No current facility-administered medications for this visit.    Review of Systems  Constitutional: negative Eyes: negative Ears, nose, mouth, throat, and face: negative Respiratory: negative Cardiovascular: negative Gastrointestinal: negative Genitourinary:negative Integument/breast: negative Hematologic/lymphatic: negative Musculoskeletal:negative Neurological: negative Behavioral/Psych: negative Endocrine: negative Allergic/Immunologic: negative  Physical Exam  VPX:TGGYI, healthy, no distress, well nourished, well developed, and anxious SKIN: skin color, texture, turgor are normal, no rashes or significant lesions HEAD: Normocephalic, No masses, lesions, tenderness or abnormalities  EYES: normal, PERRLA, Conjunctiva are pink and non-injected EARS: External ears normal, Canals clear OROPHARYNX:no exudate, no erythema, and lips, buccal mucosa, and tongue normal  NECK: supple, no adenopathy, no JVD LYMPH:  no palpable lymphadenopathy, no hepatosplenomegaly BREAST:not examined LUNGS: clear to auscultation , and palpation HEART: regular rate & rhythm, no murmurs, and no gallops ABDOMEN:abdomen soft, non-tender, normal bowel sounds, and no masses or organomegaly BACK: Back symmetric, no curvature., No CVA tenderness EXTREMITIES:no joint deformities, effusion, or inflammation, no edema  NEURO: alert & oriented x 3 with fluent speech, no focal motor/sensory deficits  PERFORMANCE STATUS: ECOG 0  LABORATORY DATA: Lab Results  Component Value Date   WBC 5.7 01/29/2022   HGB 11.4 (L) 01/29/2022   HCT 33.4 (L) 01/29/2022   MCV 100.0 01/29/2022   PLT 112 (L) 01/29/2022      Chemistry      Component Value  Date/Time   NA 139 01/29/2022 1137   NA 138 08/24/2021 0000   K 3.7 01/29/2022 1137   CL 107 01/29/2022 1137   CO2 26 01/29/2022 1137   BUN 7 (L) 01/29/2022 1137   BUN 13 08/24/2021 0000   CREATININE 0.82 01/29/2022 1137      Component Value Date/Time   CALCIUM 9.2 01/29/2022 1137   ALKPHOS 119 01/29/2022 1137   AST 74 (H) 01/29/2022 1137   ALT 33 01/29/2022 1137   BILITOT 0.7 01/29/2022 1137       RADIOGRAPHIC STUDIES: No results found.  ASSESSMENT: This is a pleasant 66 years old African-American female presented for evaluation of iron overload with significant elevation of her ferritin level as well as serum iron and iron saturation.  The patient has multiple medical problems including congestive heart failure as well as COPD and alcohol abuse.  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 The patient also has elevated AFP and liver cirrhosis.   PLAN: I had a lengthy discussion with the patient and her friend today about her current condition and further investigation to identify the etiology of her elevated ferritin and iron overload.  I repeated several studies today including the hemochromatosis DNA analysis in addition to serum iron and ferritin.  Her serum iron is still elevated at 173 which is just above the normal range with iron saturation of 63% and TIBC of 273.  Ferritin level is still pending comprehensive metabolic panel is better and showed slight elevation of AST to 74.  CBC showed mild anemia with hemoglobin of 11.4 and hematocrit 33.4% with platelets count of 1 12,000 consistent with her alcohol abuse and probably liver cirrhosis.  She has normal vitamin B12 and serum folate. I recommended for the patient to proceed with the liver biopsy as well as a colonoscopy as recommended by her gastroenterologist. I will see her back for follow-up visit in 1 months for evaluation and repeat blood  work. I do not think heterozygous H63D would explain her elevated ferritin level which is likely to be acute phase reactant with a inflammatory process and alcohol abuse.  No requirement for phlebotomy at this point. I strongly encouraged the patient to quit alcohol drinking and she will work on this with her boyfriend. The patient was advised to call immediately if she has any other concerning symptoms in the interval.  The patient voices understanding of current disease status and treatment options and is in agreement with the current care plan.  All questions were answered. The  patient knows to call the clinic with any problems, questions or concerns. We can certainly see the patient much sooner if necessary.  Thank you so much for allowing me to participate in the care of [employer]. I will continue to follow up the patient with you and assist in her care.  The total time spent in the appointment was 60 minutes.  Disclaimer: This note was dictated with voice recognition software. Similar sounding words can inadvertently be transcribed and may not be corrected upon review.   [name] Jan 29, 2022, 12:10 PM

## 2022-01-30 LAB — COLOGUARD

## 2022-02-05 ENCOUNTER — Telehealth: Payer: Self-pay | Admitting: Internal Medicine

## 2022-02-05 ENCOUNTER — Ambulatory Visit (INDEPENDENT_AMBULATORY_CARE_PROVIDER_SITE_OTHER): Payer: Medicare Other

## 2022-02-05 DIAGNOSIS — Z95810 Presence of automatic (implantable) cardiac defibrillator: Secondary | ICD-10-CM

## 2022-02-05 DIAGNOSIS — I5022 Chronic systolic (congestive) heart failure: Secondary | ICD-10-CM

## 2022-02-05 NOTE — Telephone Encounter (Signed)
Pt c/o swelling: STAT is pt has developed SOB within 24 hours  If swelling, where is the swelling located? Left leg and ankle  How much weight have you gained and in what time span? No  Have you gained 3 pounds in a day or 5 pounds in a week? No  Do you have a log of your daily weights (if so, list)? 175 lbs  Are you currently taking a fluid pill? No  Are you currently SOB? No  Have you traveled recently? No

## 2022-02-05 NOTE — Telephone Encounter (Signed)
Spoke with patient.  Advised to send manual remote transmission to recheck fluid levels.  She will send report when she returns home later today.

## 2022-02-05 NOTE — Telephone Encounter (Signed)
Called pt reports has swelling to to left calf and foot that started on Wed 01/30/22.  Reports left leg is sore but denies redness.  Pt reports does not use salt and is mindful of diet.  However, reports had beef hotdogs recently.  Advised pt that processed foods and deli meats tend to be high in salt.  Added salt to chicken about a week ago.  Does not check BP at home.  Does not have compression socks advised that this could potentially help with swelling. Does elevate legs throughout the day which helps with swelling.  However, when pt gets up and moves around swelling returns.   Pt reports wanted to let Margarita Grizzle, RN know this information since was told to notify of swelling.

## 2022-02-05 NOTE — Progress Notes (Unsigned)
EPIC Encounter for ICM Monitoring  Patient Name: [name] is a 66 y.o. female Date: 02/05/2022 Primary Care Physican: Berna Bue Primary Cardiologist: Johnsie Cancel Electrophysiologist: Lovena Le 12/28/2021 Weight: 175 lbs  01/29/2022 Weight: 175 lbs 02/05/2022 Weight: 175 lbs                                                            Spoke with patient and heart failure questions reviewed.  Pt reports swelling of left foot with very minimal in right foot.  She reports a liver bx is scheduled for 6/13 by [facility] PCP.    Pt reports decreasing salt intake.     Optivol thoracic impedance continues to suggest possible fluid accumulation starting mid April and trending back to close baseline.     Prescribed: Spironolactone 25 mg take 0.5 tablet (12.5 mg total)  by mouth daily.   Labs: 01/29/2022 Creatinine 0.82, BUN 7, Potassium 3.7, Sodium 139, GFR >60 12/02/2021 Creatinine 0.77, BUN 12, Potassium 4.4, Sodium 136, GFR >60  A complete set of results can be found in Results Review.  Recommendations:    Dr Johnsie Cancel for review and recommendations if needed.     Advised to limit salt intake to 2000 mg daily and fluid intake to 64 oz daily.     Follow-up plan: ICM clinic phone appointment on 02/12/2022 to recheck fluid levels (manual).   91 day device clinic remote transmission 03/15/2022.     EP/Cardiology Office Visits:  02/21/2022 with Dr Johnsie Cancel.   Recall 10/27/2021 with Dr Lovena Le, Tommye Standard PA or Estrella Myrtle, [place].   Copy of ICM check sent to Dr. Lovena Le.   3 month ICM trend: 02/05/2022.    12-14 Month ICM trend:     [name], RN 02/05/2022 3:25 PM

## 2022-02-05 NOTE — Telephone Encounter (Signed)
Pt denies SOB

## 2022-02-06 LAB — ERYTHROPOIETIN: Erythropoietin: 44.2 m[IU]/mL — ABNORMAL HIGH (ref 2.6–18.5)

## 2022-02-06 LAB — HEMOCHROMATOSIS DNA-PCR(C282Y,H63D)

## 2022-02-06 NOTE — Progress Notes (Signed)
Call to patient and advised Dr Johnsie Cancel recommended she stay on Spironolactone and no changes are needed at this time.  Will recheck fluid levels next week.

## 2022-02-06 NOTE — Progress Notes (Signed)
[name], MD  Azarria Balint Panda, RN; Aris Georgia, Olin Hauser, RN Aldactone is fine

## 2022-02-07 ENCOUNTER — Emergency Department (HOSPITAL_COMMUNITY): Payer: Medicare Other

## 2022-02-07 ENCOUNTER — Encounter (HOSPITAL_COMMUNITY): Payer: Self-pay

## 2022-02-07 ENCOUNTER — Other Ambulatory Visit: Payer: Self-pay

## 2022-02-07 ENCOUNTER — Emergency Department (HOSPITAL_BASED_OUTPATIENT_CLINIC_OR_DEPARTMENT_OTHER): Payer: Medicare Other

## 2022-02-07 ENCOUNTER — Inpatient Hospital Stay (HOSPITAL_COMMUNITY)
Admission: EM | Admit: 2022-02-07 | Discharge: 2022-02-09 | DRG: 291 | Disposition: A | Discharge status: Home / Self Care | Payer: Medicare Other | Attending: Internal Medicine | Admitting: Internal Medicine

## 2022-02-07 DIAGNOSIS — F1010 Alcohol abuse, uncomplicated: Secondary | ICD-10-CM | POA: Diagnosis present

## 2022-02-07 DIAGNOSIS — I5043 Acute on chronic combined systolic (congestive) and diastolic (congestive) heart failure: Secondary | ICD-10-CM | POA: Diagnosis present

## 2022-02-07 DIAGNOSIS — R0902 Hypoxemia: Secondary | ICD-10-CM | POA: Diagnosis not present

## 2022-02-07 DIAGNOSIS — E782 Mixed hyperlipidemia: Secondary | ICD-10-CM | POA: Diagnosis present

## 2022-02-07 DIAGNOSIS — Z888 Allergy status to other drugs, medicaments and biological substances status: Secondary | ICD-10-CM

## 2022-02-07 DIAGNOSIS — K2100 Gastro-esophageal reflux disease with esophagitis, without bleeding: Secondary | ICD-10-CM | POA: Diagnosis present

## 2022-02-07 DIAGNOSIS — M7989 Other specified soft tissue disorders: Secondary | ICD-10-CM

## 2022-02-07 DIAGNOSIS — G459 Transient cerebral ischemic attack, unspecified: Secondary | ICD-10-CM

## 2022-02-07 DIAGNOSIS — I110 Hypertensive heart disease with heart failure: Principal | ICD-10-CM | POA: Diagnosis present

## 2022-02-07 DIAGNOSIS — I2510 Atherosclerotic heart disease of native coronary artery without angina pectoris: Secondary | ICD-10-CM | POA: Diagnosis present

## 2022-02-07 DIAGNOSIS — Z825 Family history of asthma and other chronic lower respiratory diseases: Secondary | ICD-10-CM

## 2022-02-07 DIAGNOSIS — I509 Heart failure, unspecified: Secondary | ICD-10-CM

## 2022-02-07 DIAGNOSIS — I959 Hypotension, unspecified: Secondary | ICD-10-CM | POA: Diagnosis not present

## 2022-02-07 DIAGNOSIS — I252 Old myocardial infarction: Secondary | ICD-10-CM

## 2022-02-07 DIAGNOSIS — Z95810 Presence of automatic (implantable) cardiac defibrillator: Secondary | ICD-10-CM | POA: Diagnosis not present

## 2022-02-07 DIAGNOSIS — Z789 Other specified health status: Secondary | ICD-10-CM | POA: Diagnosis not present

## 2022-02-07 DIAGNOSIS — Z8673 Personal history of transient ischemic attack (TIA), and cerebral infarction without residual deficits: Secondary | ICD-10-CM

## 2022-02-07 DIAGNOSIS — Z79899 Other long term (current) drug therapy: Secondary | ICD-10-CM

## 2022-02-07 DIAGNOSIS — I10 Essential (primary) hypertension: Secondary | ICD-10-CM

## 2022-02-07 DIAGNOSIS — J441 Chronic obstructive pulmonary disease with (acute) exacerbation: Secondary | ICD-10-CM | POA: Diagnosis present

## 2022-02-07 DIAGNOSIS — J9601 Acute respiratory failure with hypoxia: Secondary | ICD-10-CM | POA: Diagnosis present

## 2022-02-07 DIAGNOSIS — Z7982 Long term (current) use of aspirin: Secondary | ICD-10-CM

## 2022-02-07 DIAGNOSIS — R0602 Shortness of breath: Secondary | ICD-10-CM | POA: Diagnosis not present

## 2022-02-07 DIAGNOSIS — E039 Hypothyroidism, unspecified: Secondary | ICD-10-CM | POA: Diagnosis present

## 2022-02-07 DIAGNOSIS — D696 Thrombocytopenia, unspecified: Secondary | ICD-10-CM | POA: Diagnosis present

## 2022-02-07 DIAGNOSIS — E785 Hyperlipidemia, unspecified: Secondary | ICD-10-CM

## 2022-02-07 DIAGNOSIS — Z87891 Personal history of nicotine dependence: Secondary | ICD-10-CM

## 2022-02-07 DIAGNOSIS — I5022 Chronic systolic (congestive) heart failure: Secondary | ICD-10-CM

## 2022-02-07 DIAGNOSIS — Z79890 Hormone replacement therapy (postmenopausal): Secondary | ICD-10-CM

## 2022-02-07 DIAGNOSIS — I739 Peripheral vascular disease, unspecified: Secondary | ICD-10-CM | POA: Diagnosis present

## 2022-02-07 DIAGNOSIS — J449 Chronic obstructive pulmonary disease, unspecified: Secondary | ICD-10-CM

## 2022-02-07 DIAGNOSIS — E876 Hypokalemia: Secondary | ICD-10-CM

## 2022-02-07 DIAGNOSIS — D539 Nutritional anemia, unspecified: Secondary | ICD-10-CM | POA: Diagnosis present

## 2022-02-07 DIAGNOSIS — R7989 Other specified abnormal findings of blood chemistry: Secondary | ICD-10-CM | POA: Diagnosis present

## 2022-02-07 DIAGNOSIS — Z8249 Family history of ischemic heart disease and other diseases of the circulatory system: Secondary | ICD-10-CM

## 2022-02-07 LAB — CBC WITH DIFFERENTIAL/PLATELET
Abs Immature Granulocytes: 0.01 10*3/uL (ref 0.00–0.07)
Basophils Absolute: 0.1 10*3/uL (ref 0.0–0.1)
Basophils Relative: 1 %
Eosinophils Absolute: 0.4 10*3/uL (ref 0.0–0.5)
Eosinophils Relative: 8 %
HCT: 36 % (ref 36.0–46.0)
Hemoglobin: 11.7 g/dL — ABNORMAL LOW (ref 12.0–15.0)
Immature Granulocytes: 0 %
Lymphocytes Relative: 41 %
Lymphs Abs: 2.4 10*3/uL (ref 0.7–4.0)
MCH: 33.5 pg (ref 26.0–34.0)
MCHC: 32.5 g/dL (ref 30.0–36.0)
MCV: 103.2 fL — ABNORMAL HIGH (ref 80.0–100.0)
Monocytes Absolute: 0.6 10*3/uL (ref 0.1–1.0)
Monocytes Relative: 11 %
Neutro Abs: 2.3 10*3/uL (ref 1.7–7.7)
Neutrophils Relative %: 39 %
Platelets: 117 10*3/uL — ABNORMAL LOW (ref 150–400)
RBC: 3.49 MIL/uL — ABNORMAL LOW (ref 3.87–5.11)
RDW: 14.7 % (ref 11.5–15.5)
WBC: 5.8 10*3/uL (ref 4.0–10.5)
nRBC: 0 % (ref 0.0–0.2)

## 2022-02-07 LAB — COMPREHENSIVE METABOLIC PANEL
ALT: 29 U/L (ref 0–44)
AST: 56 U/L — ABNORMAL HIGH (ref 15–41)
Albumin: 3.3 g/dL — ABNORMAL LOW (ref 3.5–5.0)
Alkaline Phosphatase: 94 U/L (ref 38–126)
Anion gap: 8 (ref 5–15)
BUN: 6 mg/dL — ABNORMAL LOW (ref 8–23)
CO2: 23 mmol/L (ref 22–32)
Calcium: 8.5 mg/dL — ABNORMAL LOW (ref 8.9–10.3)
Chloride: 108 mmol/L (ref 98–111)
Creatinine, Ser: 0.7 mg/dL (ref 0.44–1.00)
GFR, Estimated: >60 mL/min (ref >60)
Glucose, Bld: 111 mg/dL — ABNORMAL HIGH (ref 70–99)
Potassium: 3.1 mmol/L — ABNORMAL LOW (ref 3.5–5.1)
Sodium: 139 mmol/L (ref 135–145)
Total Bilirubin: 0.8 mg/dL (ref 0.3–1.2)
Total Protein: 7 g/dL (ref 6.5–8.1)

## 2022-02-07 LAB — MAGNESIUM: Magnesium: 1.4 mg/dL — ABNORMAL LOW (ref 1.7–2.4)

## 2022-02-07 LAB — TROPONIN I (HIGH SENSITIVITY): Troponin I (High Sensitivity): 10 ng/L (ref <18)

## 2022-02-07 LAB — BRAIN NATRIURETIC PEPTIDE: B Natriuretic Peptide: 766.2 pg/mL — ABNORMAL HIGH (ref 0.0–100.0)

## 2022-02-07 MED ORDER — LEVOTHYROXINE SODIUM 25 MCG PO TABS: 25.0000 ug | ORAL_TABLET | Freq: Every day | ORAL | Status: DC

## 2022-02-07 MED ORDER — ATORVASTATIN CALCIUM 40 MG PO TABS
40.0000 mg | ORAL_TABLET | Freq: Every day | ORAL | Status: DC
  Administered 2022-02-08 – 2022-02-09 (×2): 40 mg via ORAL
  Filled 2022-02-07 (×2): qty 1

## 2022-02-07 MED ORDER — POTASSIUM CHLORIDE CRYS ER 20 MEQ PO TBCR
60.0000 meq | EXTENDED_RELEASE_TABLET | Freq: Once | ORAL | Status: AC
  Administered 2022-02-07: 60 meq via ORAL
  Filled 2022-02-07: qty 3

## 2022-02-07 MED ORDER — FUROSEMIDE 10 MG/ML IJ SOLN
20.0000 mg | Freq: Two times a day (BID) | INTRAMUSCULAR | Status: DC
  Administered 2022-02-07 – 2022-02-08 (×2): 20 mg via INTRAVENOUS
  Filled 2022-02-07: qty 2
  Filled 2022-02-07: qty 4

## 2022-02-07 MED ORDER — SODIUM CHLORIDE (PF) 0.9 % IJ SOLN
INTRAMUSCULAR | Status: AC
  Filled 2022-02-07: qty 50

## 2022-02-07 MED ORDER — ASPIRIN 81 MG PO TBEC
81.0000 mg | DELAYED_RELEASE_TABLET | Freq: Every day | ORAL | Status: DC
  Administered 2022-02-08 – 2022-02-09 (×2): 81 mg via ORAL
  Filled 2022-02-07 (×2): qty 1

## 2022-02-07 MED ORDER — CARVEDILOL 12.5 MG PO TABS
12.5000 mg | ORAL_TABLET | Freq: Two times a day (BID) | ORAL | Status: DC
  Administered 2022-02-08: 12.5 mg via ORAL
  Filled 2022-02-07: qty 1

## 2022-02-07 MED ORDER — ACETAMINOPHEN 325 MG PO TABS: 650.0000 mg | ORAL_TABLET | Freq: Four times a day (QID) | ORAL | Status: DC | PRN

## 2022-02-07 MED ORDER — IOHEXOL 350 MG/ML SOLN
75.0000 mL | Freq: Once | INTRAVENOUS | Status: AC | PRN
  Administered 2022-02-07: 75 mL via INTRAVENOUS

## 2022-02-07 MED ORDER — SPIRONOLACTONE 12.5 MG HALF TABLET
12.5000 mg | ORAL_TABLET | Freq: Every day | ORAL | Status: DC
  Administered 2022-02-08 – 2022-02-09 (×2): 12.5 mg via ORAL
  Filled 2022-02-07 (×2): qty 1

## 2022-02-07 MED ORDER — SODIUM CHLORIDE 0.9% FLUSH
3.0000 mL | Freq: Two times a day (BID) | INTRAVENOUS | Status: DC
  Administered 2022-02-07 – 2022-02-09 (×3): 3 mL via INTRAVENOUS

## 2022-02-07 MED ORDER — POLYETHYLENE GLYCOL 3350 17 G PO PACK: 17.0000 g | PACK | Freq: Every day | ORAL | Status: DC | PRN

## 2022-02-07 MED ORDER — SACUBITRIL-VALSARTAN 97-103 MG PO TABS
1.0000 | ORAL_TABLET | Freq: Two times a day (BID) | ORAL | Status: DC
  Administered 2022-02-07 – 2022-02-08 (×2): 1 via ORAL
  Filled 2022-02-07 (×2): qty 1

## 2022-02-07 MED ORDER — FUROSEMIDE 10 MG/ML IJ SOLN
20.0000 mg | Freq: Once | INTRAMUSCULAR | Status: AC
  Administered 2022-02-07: 20 mg via INTRAVENOUS
  Filled 2022-02-07: qty 4

## 2022-02-07 MED ORDER — ACETAMINOPHEN 650 MG RE SUPP: 650.0000 mg | Freq: Four times a day (QID) | RECTAL | Status: DC | PRN

## 2022-02-07 MED ORDER — ENOXAPARIN SODIUM 40 MG/0.4ML IJ SOSY
40.0000 mg | PREFILLED_SYRINGE | INTRAMUSCULAR | Status: DC
  Administered 2022-02-07 – 2022-02-08 (×2): 40 mg via SUBCUTANEOUS
  Filled 2022-02-07 (×2): qty 0.4

## 2022-02-07 NOTE — ED Provider Notes (Signed)
[place] DEPT Provider Note   CSN: [id number] Arrival date & time: 02/07/22  1303     History  Chief Complaint  Patient presents with   Shortness of Breath    [name] is a 66 y.o. female.  HPI Patient is a 66 year old female with history of hypertension, CAD, heart failure, PVD, TIA, COPD, who presents to the emergency department due to shortness of breath.  Patient states that she has been experiencing mild pedal edema for the past few days.  Also notes some pain in the left lower leg.  Earlier today when getting dressed she became more short of breath.  She then went to the store and while walking in the store she became significantly more short of breath and was not able to continue ambulating.  Denies any chest pain, chest tightness, abdominal pain, nausea, vomiting, diarrhea.  Denies any home oxygen requirement.  She was found to be saturating in the mid 80s upon arrival and was placed on 2 L via nasal cannula with improvement to the high 90s.  Patient denies a history of blood clots.    Home Medications Prior to Admission medications   Medication Sig Start Date End Date Taking? Authorizing Provider  aspirin EC 81 MG tablet Take 81 mg by mouth daily.   Yes [provider]  atorvastatin (LIPITOR) 40 MG tablet Take 40 mg by mouth daily.   Yes [provider]  carvedilol (COREG) 12.5 MG tablet Take 1 tablet (12.5 mg total) by mouth 2 (two) times daily with a meal. 08/13/21  Yes [name], MD  nitroGLYCERIN (NITROSTAT) 0.4 MG SL tablet Place 1 tablet (0.4 mg total) under the tongue every 5 (five) minutes as needed for chest pain (up to 3 doses). 11/03/17  Yes [name], MD  sacubitril-valsartan (ENTRESTO) 97-103 MG Take 1 tablet by mouth 2 (two) times daily. 08/13/21  Yes [name], MD  spironolactone (ALDACTONE) 25 MG tablet Take 0.5 tablets (12.5 mg total) by mouth daily. 08/13/21  Yes [name], MD   albuterol (PROAIR HFA) 108 (90 Base) MCG/ACT inhaler Inhale 2 puffs into the lungs every 6 (six) hours as needed for wheezing or shortness of breath. Patient not taking: Reported on 01/29/2022 03/28/20   Brand Males, MD  atorvastatin (LIPITOR) 40 MG tablet Take 1 tablet (40 mg total) by mouth daily. 10/23/21 01/21/22  Wellington Hampshire, MD  feeding supplement, ENSURE COMPLETE, (ENSURE COMPLETE) LIQD Take 237 mLs by mouth daily. Patient not taking: Reported on 02/07/2022 05/19/14   Cristal Ford, DO  levothyroxine (SYNTHROID) 25 MCG tablet Take 25 mcg by mouth daily. 01/15/22   [provider]  Misc. Devices MISC Use scale daily to monitor weight. 01/10/22   [provider]      Allergies    Clopidogrel bisulfate    Review of Systems   Review of Systems  All other systems reviewed and are negative. Ten systems reviewed and are negative for acute change, except as noted in the HPI.   Physical Exam Updated Vital Signs BP (!) 141/104   Pulse (!) 124   Temp 98 F (36.7 C) (Oral)   Resp (!) 29   SpO2 95%  Physical Exam Vitals and nursing note reviewed.  Constitutional:      General: She is not in acute distress.    Appearance: Normal appearance. She is not ill-appearing, toxic-appearing or diaphoretic.  HENT:     Head: Normocephalic and atraumatic.  Right Ear: External ear normal.     Left Ear: External ear normal.     Nose: Nose normal.     Mouth/Throat:     Mouth: Mucous membranes are moist.     Pharynx: Oropharynx is clear. No oropharyngeal exudate or posterior oropharyngeal erythema.  Eyes:     Extraocular Movements: Extraocular movements intact.  Cardiovascular:     Rate and Rhythm: Regular rhythm. Tachycardia present.     Pulses: Normal pulses.     Heart sounds: Normal heart sounds. No murmur heard.   No friction rub. No gallop.  Pulmonary:     Effort: Pulmonary effort is normal. Tachypnea present. No respiratory distress.     Breath sounds: Normal  breath sounds. No stridor. No decreased breath sounds, wheezing, rhonchi or rales.     Comments: Tachypneic.  Lungs clear to auscultation bilaterally.  Oxygen saturations around 95% on 2 L via nasal cannula. Abdominal:     General: Abdomen is flat.     Palpations: Abdomen is soft.     Tenderness: There is no abdominal tenderness.  Musculoskeletal:        General: Normal range of motion.     Cervical back: Normal range of motion and neck supple. No tenderness.     Right lower leg: Edema present.     Left lower leg: Edema present.     Comments: Trace pedal edema, left slightly greater than right.  Mild tenderness appreciated along the left posterior calf.  Palpable pedal pulses.  Skin:    General: Skin is warm and dry.  Neurological:     General: No focal deficit present.     Mental Status: She is alert and oriented to person, place, and time.  Psychiatric:        Mood and Affect: Mood normal.        Behavior: Behavior normal.   ED Results / Procedures / Treatments   Labs (all labs ordered are listed, but only abnormal results are displayed) Labs Reviewed  COMPREHENSIVE METABOLIC PANEL - Abnormal; Notable for the following components:      Result Value   Potassium 3.1 (*)    Glucose, Bld 111 (*)    BUN 6 (*)    Calcium 8.5 (*)    Albumin 3.3 (*)    AST 56 (*)    All other components within normal limits  CBC WITH DIFFERENTIAL/PLATELET - Abnormal; Notable for the following components:   RBC 3.49 (*)    Hemoglobin 11.7 (*)    MCV 103.2 (*)    Platelets 117 (*)    All other components within normal limits  BRAIN NATRIURETIC PEPTIDE - Abnormal; Notable for the following components:   B Natriuretic Peptide 766.2 (*)    All other components within normal limits  TROPONIN I (HIGH SENSITIVITY)  TROPONIN I (HIGH SENSITIVITY)   EKG None  Radiology CT Angio Chest PE W and/or Wo Contrast  Result Date: 02/07/2022 CLINICAL DATA:  Pulmonary embolism Shortness of breath EXAM: CT  ANGIOGRAPHY CHEST WITH CONTRAST TECHNIQUE: Multidetector CT imaging of the chest was performed using the standard protocol during bolus administration of intravenous contrast. Multiplanar CT image reconstructions and MIPs were obtained to evaluate the vascular anatomy. RADIATION DOSE REDUCTION: This exam was performed according to the departmental dose-optimization program which includes automated exposure control, adjustment of the mA and/or kV according to patient size and/or use of iterative reconstruction technique. CONTRAST:  75 mL OMNIPAQUE IOHEXOL 350 MG/ML SOLN COMPARISON:  12/02/2021 FINDINGS:  Cardiovascular: Single lead left chest wall AICD is seen on scout image. Heart size is within normal limits. Evaluation of the lung bases limited by motion. No pulmonary artery embolism is identified. Mediastinum/Nodes: No enlarged mediastinal, hilar, or axillary lymph nodes. Mild diffuse thickening of the wall the esophagus, particularly in the distal segment. This is best appreciated on image 113 of series 4. Lungs/Pleura: Moderate emphysematous changes the lungs. No focal airspace opacity to indicate pneumonia. Upper Abdomen: Evaluation of the upper abdomen is significantly limited by motion. No acute abnormality is seen. Musculoskeletal: Evaluation for fractures is limited by motion. No definitive acute abnormality of the visualized osseous structures. Review of the MIP images confirms the above findings. IMPRESSION: 1. No pulmonary artery embolism. 2. Evaluation for fractures is significantly limited due to motion. If there is high clinical suspicion for rib or spine fractures, repeat noncontrast chest CT should be performed. 3. Diffuse thickening of the distal esophageal wall most consistent with esophagitis. Correlation with endoscopy should be performed on nonemergent basis to evaluate for underlying mass. Electronically Signed   By: Miachel Roux M.D.   On: 02/07/2022 16:27   DG Chest Portable 1  View  Result Date: 02/07/2022 CLINICAL DATA:  Provided history: Shortness of breath. Additional history provided: Shortness of breath, leg swelling. EXAM: PORTABLE CHEST 1 VIEW COMPARISON:  CT angiogram chest/abdomen/pelvis 12/02/2021. Prior chest radiographs 12/02/2021 and earlier. FINDINGS: Redemonstrated left chest single lead AICD. Heart size within normal limits. Aortic atherosclerosis. Prominence of the interstitial lung markings, greatest at the lung bases, suspicious for interstitial edema. No evidence of pleural effusion or pneumothorax. No acute bony abnormality identified. IMPRESSION: Prominence of the interstitial lung markings, greatest at the lung bases, suspicious for interstitial edema. Aortic Atherosclerosis (ICD10-I70.0). Electronically Signed   By: Kellie Simmering D.O.   On: 02/07/2022 13:47   VAS [place] LOWER EXTREMITY VENOUS (DVT) (ONLY [facility] & [facility])  Result Date: 02/07/2022  Lower Venous DVT Study Patient Name:  [name]  Date of Exam:   02/07/2022 Medical Rec #: [id number]         Accession #:    [id number] Date of Birth: [birth year]          Patient Gender: F Patient Age:   13 years Exam Location:  [facility] Procedure:      VAS [place] LOWER EXTREMITY VENOUS (DVT) Referring Phys: Rayna Sexton --------------------------------------------------------------------------------  Indications: SOB, swelling.  Comparison Study: No prior venous studies. Extensive arterial history. Performing Technologist: Darlin Coco RDMS, RVT  Examination Guidelines: A complete evaluation includes B-mode imaging, spectral Doppler, color Doppler, and power Doppler as needed of all accessible portions of each vessel. Bilateral testing is considered an integral part of a complete examination. Limited examinations for reoccurring indications may be performed as noted. The reflux portion of the exam is performed with the patient in reverse Trendelenburg.   +---------+---------------+---------+-----------+----------+--------------+ RIGHT    CompressibilityPhasicitySpontaneityPropertiesThrombus Aging +---------+---------------+---------+-----------+----------+--------------+ CFV      Full           Yes      Yes                                 +---------+---------------+---------+-----------+----------+--------------+ SFJ      Full                                                        +---------+---------------+---------+-----------+----------+--------------+  FV Prox  Full                                                        +---------+---------------+---------+-----------+----------+--------------+ FV Mid   Full                                                        +---------+---------------+---------+-----------+----------+--------------+ FV DistalFull                                                        +---------+---------------+---------+-----------+----------+--------------+ PFV      Full                                                        +---------+---------------+---------+-----------+----------+--------------+ POP      Full           Yes      Yes                                 +---------+---------------+---------+-----------+----------+--------------+ PTV      Full                                                        +---------+---------------+---------+-----------+----------+--------------+ PERO     Full                                                        +---------+---------------+---------+-----------+----------+--------------+ Gastroc  Full                                                        +---------+---------------+---------+-----------+----------+--------------+   +---------+---------------+---------+-----------+----------+--------------+ LEFT     CompressibilityPhasicitySpontaneityPropertiesThrombus Aging  +---------+---------------+---------+-----------+----------+--------------+ CFV      Full           Yes      Yes                                 +---------+---------------+---------+-----------+----------+--------------+ SFJ      Full                                                        +---------+---------------+---------+-----------+----------+--------------+  FV Prox  Full                                                        +---------+---------------+---------+-----------+----------+--------------+ FV Mid   Full                                                        +---------+---------------+---------+-----------+----------+--------------+ FV DistalFull                                                        +---------+---------------+---------+-----------+----------+--------------+ PFV      Full                                                        +---------+---------------+---------+-----------+----------+--------------+ POP      Full           Yes      Yes                                 +---------+---------------+---------+-----------+----------+--------------+ PTV      Full                                                        +---------+---------------+---------+-----------+----------+--------------+ PERO     Full                                                        +---------+---------------+---------+-----------+----------+--------------+ Gastroc  Full                                                        +---------+---------------+---------+-----------+----------+--------------+    Summary: RIGHT: - There is no evidence of deep vein thrombosis in the lower extremity.  - No cystic structure found in the popliteal fossa.  LEFT: - There is no evidence of deep vein thrombosis in the lower extremity.  - No cystic structure found in the popliteal fossa.  *See table(s) above for measurements and observations.    Preliminary      Procedures Procedures   Medications Ordered in ED Medications  iohexol (OMNIPAQUE) 350 MG/ML injection 75 mL (75 mLs Intravenous Contrast Given 02/07/22 1609)  potassium chloride SA (KLOR-CON M) CR tablet 60 mEq (60 mEq Oral Given 02/07/22 1534)  sodium chloride (PF) 0.9 % injection (  Given by Other 02/07/22 1607)  furosemide (LASIX) injection 20 mg (20 mg Intravenous Given 02/07/22 1716)   ED Course/ Medical Decision Making/ A&P Clinical Course as of 02/07/22 1720  Thu Feb 07, 2022  1525 Patient is negative for DVT.  Will obtain a CTA of the chest.  We will give patient Klor-Con for her hypokalemia.  We will continue to closely monitor. [LJ]    Clinical Course User Index [LJ] Rayna Sexton, PA-C                           Medical Decision Making Amount and/or Complexity of Data Reviewed Labs: ordered. Radiology: ordered.  Risk Prescription drug management. Decision regarding hospitalization.  Pt is a 66 y.o. female with a complex medical history as noted above who presents to the emergency department due to shortness of breath.  Symptoms started this morning and worsened significantly while ambulating at a store.  Labs: CBC with RBCs of 3.49, hemoglobin of 11.7, MCV of 103.2, platelets of 117. CMP with a potassium of 3.1, glucose of 111, BUN of 6, calcium of 8.5, albumin of 3.3, AST of 56. Troponin of 10. Elevated BNP at 766.2.  Imaging: DVT ultrasound of the bilateral lower extremities show no evidence of deep vein thrombosis.  CT of the chest shows 1. No pulmonary artery embolism. 2. Evaluation for fractures is significantly limited due to motion. If there is high clinical suspicion for rib or spine fractures, repeat noncontrast chest CT should be performed. 3. Diffuse thickening of the distal esophageal wall most consistent with esophagitis. Correlation with endoscopy should be performed on nonemergent basis to evaluate for underlying mass.  Chest x-ray shows prominence of  the interstitial lung markings, greatest at the lung bases, suspicious for interstitial edema.  I, Rayna Sexton, PA-C, personally reviewed and evaluated these images and lab results as part of my medical decision-making.  On my exam patient is tachycardic.  No wheezing, rales, rhonchi.  Tachypneic.  Oxygen saturations in the mid 80s and requires 2 L via nasal cannula which improved to the mid to high 90s.  No home oxygen requirement.  Elevated BNP at 766.2.  Possible CHF exacerbation.  Last echocardiogram in patient's records appears to be on Jan 20, 2019 and shows an LVEF of 40% to 45%.  Patient denies taking any diuretics.  I see spironolactone on her medication list but nothing else listed.  We will give patient a 20 mg dose of IV Lasix.  Given that she has a new oxygen requirement feel that she will require admission to the hospital.  This discussed with the patient and she is amenable.  We will discuss with the medicine team at this time.  Note: Portions of this report may have been transcribed using voice recognition software. Every effort was made to ensure accuracy; however, inadvertent computerized transcription errors may be present.   Final Clinical Impression(s) / ED Diagnoses Final diagnoses:  Acute on chronic congestive heart failure, unspecified heart failure type [facility])  Hypoxia   Rx / DC Orders ED Discharge Orders     None         Rayna Sexton, PA-C 02/07/22 1723    Charlesetta Shanks, MD 02/08/22 1141

## 2022-02-07 NOTE — ED Provider Triage Note (Signed)
Emergency Medicine Provider Triage Evaluation Note  ANAM BOBBY , a 66 y.o. female  was evaluated in triage.  Pt complains of shortness of breath.  States her symptoms started this morning at home while getting ready.  She then went to [facility] and became so short of breath that she could not walk to the store.  Denies any chest pain or tightness.  Denies any abdominal pain, nausea, vomiting, diarrhea.  She does note some mild leg swelling.  Denies any home oxygen requirement.  She was found to be saturating in the mid 80s upon arrival and was placed on 2 L via nasal cannula with improvement to the high 90s.  Physical Exam  BP (!) 158/107 (BP Location: Right Arm)   Pulse (!) 108   Temp 98 F (36.7 C) (Oral)   Resp 20   SpO2 (!) 87%  Gen:   Awake, no distress   Resp:  Normal effort  MSK:   Moves extremities without difficulty  Other:  Trace pedal edema.  2 L via nasal cannula saturating around 95%.  Medical Decision Making  Medically screening exam initiated at 1:30 PM.  Appropriate orders placed.  Alene Bergerson Daidone was informed that the remainder of the evaluation will be completed by another provider, this initial triage assessment does not replace that evaluation, and the importance of remaining in the ED until their evaluation is complete.  New oxygen requirement.  Priority patient.  Basic labs as well as troponin and BNP.   Rayna Sexton, PA-C 02/07/22 1331

## 2022-02-07 NOTE — ED Triage Notes (Signed)
Pt arrived via POV, c/o SOB and leg swelling. States she was told she has fluid on her heart. Pt spo2 mid 35s in triage. Placed on 2L [place]

## 2022-02-07 NOTE — ED Notes (Signed)
Patient ambulatory to bathroom.

## 2022-02-07 NOTE — Progress Notes (Signed)
Lower extremity venous bilateral study completed.  Preliminary results relayed to [place], [place].  See CV Proc for preliminary results report.   Darlin Coco, RDMS, RVT

## 2022-02-07 NOTE — H&P (Signed)
History and Physical   [name]:[id number] DOB: 01-11-1956 DOA: 02/07/2022  PCP: Myrtie Neither, PA-C   Patient coming from: Home  Chief Complaint: Shortness of breath  HPI: [name] is a 66 y.o. female with medical history significant of hypertension, CAD, hyperlipidemia, CHF, PVD, TIA, COPD, alcohol use, diverticulosis hypothyroidism presenting with shortness of breath.  Patient reports several days of ongoing shortness of breath significantly worse with exertion and now difficult to even walk through the store.  Also reporting some pedal edema for the past few days as well.  She has known history of CHF not currently on loop diuretic.  She denies fevers, chills, chest pain, abdominal pain, constipation, diarrhea, nausea, vomiting.  ED Course: Vital signs in the ED significant for heart rate in the 100s to 120s, blood pressure in the 250N to 397 systolic, respiratory rate in the 20s saturating well on 2 L and was saturating in the 80s on room air.  Lab work-up showed CMP with potassium 3.1, calcium 8.5, albumin 3.3, AST stable at 5.6.  CBC with hemoglobin stable at 1.7 and platelets stable at 117.  Troponin normal with repeat pending.  BNP elevated to 766.  DVT study was negative.  Chest x-ray showed prominent interstitial markings consistent with edema.  CTA showed no PE did show esophagitis with recommendation to consider nonurgent endoscopy.  Patient received Lasix and 60 mg p.o. potassium in the ED.  Review of Systems: As per HPI otherwise all other systems reviewed and are negative.  Past Medical History:  Diagnosis Date   Acute on chronic systolic heart failure ([place])    Acute respiratory failure (HCC)    Automatic implantable cardioverter-defibrillator in situ    CAD (coronary artery disease)    a. Inf-post MI 2008 s/p BMS to large marginal of Cx.    COPD (chronic obstructive pulmonary disease) ([place])    ETOH abuse    Hyperglycemia    HYPERLIPIDEMIA-MIXED  02/07/2009   HYPERTENSION, BENIGN 08/22/2009   PVD (peripheral vascular disease) ([place])    a. Evaluated by Dr. [name] Anon 08/2012.   SYSTOLIC HEART FAILURE, CHRONIC 02/07/2009   a. EF 30% 2010, 29% by MRI 08/2012. b. s/p prophylactic Medtronic ICD implantation 10/2012.   TIA (transient ischemic attack) 05/18/2014   Tobacco abuse    VENTRICULAR TACHYCARDIA 05/21/2010    Past Surgical History:  Procedure Laterality Date   CARDIAC DEFIBRILLATOR PLACEMENT  10/2012   CESAREAN SECTION  1977; 1982   COLONOSCOPY     CYSTOSCOPY W/ STONE MANIPULATION  1990's   ESOPHAGOGASTRODUODENOSCOPY (EGD) WITH PROPOFOL N/A 08/04/2018   Procedure: ESOPHAGOGASTRODUODENOSCOPY (EGD) WITH PROPOFOL;  Surgeon: Gatha Mayer, MD;  Location: [facility];  Service: Endoscopy;  Laterality: N/A;   IMPLANTABLE CARDIOVERTER DEFIBRILLATOR IMPLANT N/A 10/21/2012   Procedure: IMPLANTABLE CARDIOVERTER DEFIBRILLATOR IMPLANT;  Surgeon: Evans Lance, MD;  Location: [facility] CATH LAB;  Service: Cardiovascular;  Laterality: N/A;   RIGHT/LEFT HEART CATH AND CORONARY ANGIOGRAPHY N/A 03/26/2018   Procedure: RIGHT/LEFT HEART CATH AND CORONARY ANGIOGRAPHY;  Surgeon: Nelva Bush, MD;  Location: [place] CV LAB;  Service: Cardiovascular;  Laterality: N/A;    Social History  reports that she quit smoking about 3 years ago. Her smoking use included cigarettes. She has a 20.00 pack-year smoking history. She has never used smokeless tobacco. She reports current alcohol use of about 20.0 standard drinks per week. She reports that she does not use drugs.  Allergies  Allergen Reactions   Clopidogrel Bisulfate Rash  Family History  Problem Relation Age of Onset   Cancer Mother        cervical   Heart attack Father    Asthma Brother    Other Brother        Pacemaker  Reviewed on admission  Prior to Admission medications   Medication Sig Start Date End Date Taking? Authorizing Provider  aspirin EC 81 MG tablet Take 81 mg by mouth daily.    Yes [provider]  carvedilol (COREG) 12.5 MG tablet Take 1 tablet (12.5 mg total) by mouth 2 (two) times daily with a meal. 08/13/21  Yes [name], MD  nitroGLYCERIN (NITROSTAT) 0.4 MG SL tablet Place 1 tablet (0.4 mg total) under the tongue every 5 (five) minutes as needed for chest pain (up to 3 doses). 11/03/17  Yes [name], MD  sacubitril-valsartan (ENTRESTO) 97-103 MG Take 1 tablet by mouth 2 (two) times daily. 08/13/21  Yes [name], MD  spironolactone (ALDACTONE) 25 MG tablet Take 0.5 tablets (12.5 mg total) by mouth daily. 08/13/21  Yes [name], MD  albuterol (PROAIR HFA) 108 (90 Base) MCG/ACT inhaler Inhale 2 puffs into the lungs every 6 (six) hours as needed for wheezing or shortness of breath. Patient not taking: Reported on 01/29/2022 03/28/20   Brand Males, MD  atorvastatin (LIPITOR) 40 MG tablet Take 1 tablet (40 mg total) by mouth daily. 10/23/21 01/21/22  Wellington Hampshire, MD  feeding supplement, ENSURE COMPLETE, (ENSURE COMPLETE) LIQD Take 237 mLs by mouth daily. Patient not taking: Reported on 02/07/2022 05/19/14   Cristal Ford, DO  levothyroxine (SYNTHROID) 25 MCG tablet Take 25 mcg by mouth daily. 01/15/22   [provider]  Misc. Devices MISC Use scale daily to monitor weight. 01/10/22   [provider]    Physical Exam: Vitals:   02/07/22 1730 02/07/22 1800 02/07/22 1830 02/07/22 1900  BP: (!) 139/99 (!) 143/106 (!) 140/96 (!) 142/108  Pulse: (!) 123 (!) 126 (!) 120 (!) 116  Resp: (!) 24 (!) 21 (!) 28 (!) 25  Temp:      TempSrc:      SpO2: 98% 98% 97% 97%    Physical Exam Constitutional:      General: She is not in acute distress.    Appearance: Normal appearance.  HENT:     Head: Normocephalic and atraumatic.     Mouth/Throat:     Mouth: Mucous membranes are moist.     Pharynx: Oropharynx is clear.  Eyes:     Extraocular Movements: Extraocular movements intact.     Pupils: Pupils are equal, round, and  reactive to light.  Cardiovascular:     Rate and Rhythm: Normal rate and regular rhythm.     Pulses: Normal pulses.     Heart sounds: Normal heart sounds.  Pulmonary:     Effort: Pulmonary effort is normal. No respiratory distress.     Breath sounds: Normal breath sounds.  Abdominal:     General: Bowel sounds are normal. There is no distension.     Palpations: Abdomen is soft.     Tenderness: There is no abdominal tenderness.  Musculoskeletal:        General: No swelling or deformity.     Right lower leg: Edema (trace) present.     Left lower leg: Edema (trace) present.  Skin:    General: Skin is warm and dry.  Neurological:     General: No focal deficit present.  Mental Status: Mental status is at baseline.   Labs on Admission: I have personally reviewed following labs and imaging studies  CBC: Recent Labs  Lab 02/07/22 1356  WBC 5.8  NEUTROABS 2.3  HGB 11.7*  HCT 36.0  MCV 103.2*  PLT 117*    Basic Metabolic Panel: Recent Labs  Lab 02/07/22 1356  NA 139  K 3.1*  CL 108  CO2 23  GLUCOSE 111*  BUN 6*  CREATININE 0.70  CALCIUM 8.5*    GFR: Estimated Creatinine Clearance: 74.2 mL/min (by C-G formula based on SCr of 0.7 mg/dL).  Liver Function Tests: Recent Labs  Lab 02/07/22 1356  AST 56*  ALT 29  ALKPHOS 94  BILITOT 0.8  PROT 7.0  ALBUMIN 3.3*    Urine analysis:    Component Value Date/Time   COLORURINE STRAW (A) 11/24/2020 0804   APPEARANCEUR CLEAR 11/24/2020 0804   APPEARANCEUR Clear 02/18/2018 1007   LABSPEC 1.003 (L) 11/24/2020 0804   PHURINE 6.0 11/24/2020 0804   GLUCOSEU NEGATIVE 11/24/2020 0804   HGBUR SMALL (A) 11/24/2020 0804   BILIRUBINUR NEGATIVE 11/24/2020 0804   BILIRUBINUR Negative 02/18/2018 1007   KETONESUR NEGATIVE 11/24/2020 0804   PROTEINUR NEGATIVE 11/24/2020 0804   UROBILINOGEN 0.2 03/04/2010 2317   NITRITE NEGATIVE 11/24/2020 0804   LEUKOCYTESUR LARGE (A) 11/24/2020 0804    Radiological Exams on Admission: CT  Angio Chest PE W and/or Wo Contrast  Result Date: 02/07/2022 CLINICAL DATA:  Pulmonary embolism Shortness of breath EXAM: CT ANGIOGRAPHY CHEST WITH CONTRAST TECHNIQUE: Multidetector CT imaging of the chest was performed using the standard protocol during bolus administration of intravenous contrast. Multiplanar CT image reconstructions and MIPs were obtained to evaluate the vascular anatomy. RADIATION DOSE REDUCTION: This exam was performed according to the departmental dose-optimization program which includes automated exposure control, adjustment of the mA and/or kV according to patient size and/or use of iterative reconstruction technique. CONTRAST:  75 mL OMNIPAQUE IOHEXOL 350 MG/ML SOLN COMPARISON:  12/02/2021 FINDINGS: Cardiovascular: Single lead left chest wall AICD is seen on scout image. Heart size is within normal limits. Evaluation of the lung bases limited by motion. No pulmonary artery embolism is identified. Mediastinum/Nodes: No enlarged mediastinal, hilar, or axillary lymph nodes. Mild diffuse thickening of the wall the esophagus, particularly in the distal segment. This is best appreciated on image 113 of series 4. Lungs/Pleura: Moderate emphysematous changes the lungs. No focal airspace opacity to indicate pneumonia. Upper Abdomen: Evaluation of the upper abdomen is significantly limited by motion. No acute abnormality is seen. Musculoskeletal: Evaluation for fractures is limited by motion. No definitive acute abnormality of the visualized osseous structures. Review of the MIP images confirms the above findings. IMPRESSION: 1. No pulmonary artery embolism. 2. Evaluation for fractures is significantly limited due to motion. If there is high clinical suspicion for rib or spine fractures, repeat noncontrast chest CT should be performed. 3. Diffuse thickening of the distal esophageal wall most consistent with esophagitis. Correlation with endoscopy should be performed on nonemergent basis to evaluate  for underlying mass. Electronically Signed   By: Miachel Roux M.D.   On: 02/07/2022 16:27   DG Chest Portable 1 View  Result Date: 02/07/2022 CLINICAL DATA:  Provided history: Shortness of breath. Additional history provided: Shortness of breath, leg swelling. EXAM: PORTABLE CHEST 1 VIEW COMPARISON:  CT angiogram chest/abdomen/pelvis 12/02/2021. Prior chest radiographs 12/02/2021 and earlier. FINDINGS: Redemonstrated left chest single lead AICD. Heart size within normal limits. Aortic atherosclerosis. Prominence of the interstitial lung markings,  greatest at the lung bases, suspicious for interstitial edema. No evidence of pleural effusion or pneumothorax. No acute bony abnormality identified. IMPRESSION: Prominence of the interstitial lung markings, greatest at the lung bases, suspicious for interstitial edema. Aortic Atherosclerosis (ICD10-I70.0). Electronically Signed   By: Kellie Simmering D.O.   On: 02/07/2022 13:47   VAS [place] LOWER EXTREMITY VENOUS (DVT) (ONLY [facility] & [facility])  Result Date: 02/07/2022  Lower Venous DVT Study Patient Name:  TRIVA HUEBER  Date of Exam:   02/07/2022 Medical Rec #: [id number]         Accession #:    [id number] Date of Birth: [birth year]          Patient Gender: F Patient Age:   76 years Exam Location:  [facility] Procedure:      VAS [place] LOWER EXTREMITY VENOUS (DVT) Referring Phys: Rayna Sexton --------------------------------------------------------------------------------  Indications: SOB, swelling.  Comparison Study: No prior venous studies. Extensive arterial history. Performing Technologist: Darlin Coco RDMS, RVT  Examination Guidelines: A complete evaluation includes B-mode imaging, spectral Doppler, color Doppler, and power Doppler as needed of all accessible portions of each vessel. Bilateral testing is considered an integral part of a complete examination. Limited examinations for reoccurring indications may be performed as noted. The reflux portion of the exam is  performed with the patient in reverse Trendelenburg.  +---------+---------------+---------+-----------+----------+--------------+ RIGHT    CompressibilityPhasicitySpontaneityPropertiesThrombus Aging +---------+---------------+---------+-----------+----------+--------------+ CFV      Full           Yes      Yes                                 +---------+---------------+---------+-----------+----------+--------------+ SFJ      Full                                                        +---------+---------------+---------+-----------+----------+--------------+ FV Prox  Full                                                        +---------+---------------+---------+-----------+----------+--------------+ FV Mid   Full                                                        +---------+---------------+---------+-----------+----------+--------------+ FV DistalFull                                                        +---------+---------------+---------+-----------+----------+--------------+ PFV      Full                                                        +---------+---------------+---------+-----------+----------+--------------+  POP      Full           Yes      Yes                                 +---------+---------------+---------+-----------+----------+--------------+ PTV      Full                                                        +---------+---------------+---------+-----------+----------+--------------+ PERO     Full                                                        +---------+---------------+---------+-----------+----------+--------------+ Gastroc  Full                                                        +---------+---------------+---------+-----------+----------+--------------+   +---------+---------------+---------+-----------+----------+--------------+ LEFT     CompressibilityPhasicitySpontaneityPropertiesThrombus  Aging +---------+---------------+---------+-----------+----------+--------------+ CFV      Full           Yes      Yes                                 +---------+---------------+---------+-----------+----------+--------------+ SFJ      Full                                                        +---------+---------------+---------+-----------+----------+--------------+ FV Prox  Full                                                        +---------+---------------+---------+-----------+----------+--------------+ FV Mid   Full                                                        +---------+---------------+---------+-----------+----------+--------------+ FV DistalFull                                                        +---------+---------------+---------+-----------+----------+--------------+ PFV      Full                                                        +---------+---------------+---------+-----------+----------+--------------+  POP      Full           Yes      Yes                                 +---------+---------------+---------+-----------+----------+--------------+ PTV      Full                                                        +---------+---------------+---------+-----------+----------+--------------+ PERO     Full                                                        +---------+---------------+---------+-----------+----------+--------------+ Gastroc  Full                                                        +---------+---------------+---------+-----------+----------+--------------+     Summary: RIGHT: - There is no evidence of deep vein thrombosis in the lower extremity.  - No cystic structure found in the popliteal fossa.  LEFT: - There is no evidence of deep vein thrombosis in the lower extremity.  - No cystic structure found in the popliteal fossa.  *See table(s) above for measurements and observations. Electronically  signed by Orlie Pollen on 02/07/2022 at 5:24:38 PM.    Final     EKG: Independently reviewed.  Sinus tachycardia 116 bpm.  Low voltage in multiple leads.  Baseline artifact.  Nonspecific T wave flattening.  PVCs noted.  Assessment/Plan Principal Problem:   CHF exacerbation (HCC) Active Problems:   Elevated lipids   HYPERTENSION, BENIGN   CAD, NATIVE VESSEL   SYSTOLIC HEART FAILURE, CHRONIC   PVD (peripheral vascular disease) (HCC)   Automatic implantable cardioverter-defibrillator in situ   TIA (transient ischemic attack)   COPD with acute exacerbation (HCC)   Alcohol use ([place])   Acute respiratory failure with hypoxia (HCC)   Hypokalemia   CHF exacerbation Acute respiratory failure with hypoxia > Patient presenting with shortness of breath and lower extremity edema for the past several days.  Has been progressive. > Known history of CHF with last echo in 2020 with EF 40-45%, impaired diastolic function, normal RV function.  Does have AICD in place. > Not currently on loop diuretic but is on spironolactone. > BNP elevated to 766 and noted to have interstitial prominence on chest x-ray. > Received 20 mg IV Lasix in the ED due to not being on Lasix at baseline. - Monitor on telemetry - Continue with Lasix 20 mg twice daily - Strict I's and O's, daily weights - Repeat echocardiogram - Continue home Coreg, Entresto, and spironolactone - Trend renal function electrolytes - Check magnesium now  Hypokalemia > Potassium noted to be 3.1 in the ED.  Did receive 60 mill colons p.o. potassium. - Check magnesium as above - Trend electrolytes  CAD - Continue aspirin, atorvastatin, Coreg, Entresto  Hyperlipidemia - Continue home atorvastatin  PVD - Continue home aspirin  and atorvastatin  History of TIA - Continue home aspirin and atorvastatin  COPD > Not currently on any inhalers for this.  No wheezing on exam.  Hypothyroidism - Continue home Synthroid  DVT  prophylaxis: Lovenox Code Status:   Full Family Communication:  None on admission, she states her family was here earlier and are up-to-date. Disposition Plan:   Patient is from:  Home  Anticipated DC to:  Home  Anticipated DC date:  1 to 5 days  Anticipated DC barriers: None  Consults called:  None Admission status:  Observation, telemetry  Severity of Illness: The appropriate patient status for this patient is OBSERVATION. Observation status is judged to be reasonable and necessary in order to provide the required intensity of service to ensure the patient's safety. The patient's presenting symptoms, physical exam findings, and initial radiographic and laboratory data in the context of their medical condition is felt to place them at decreased risk for further clinical deterioration. Furthermore, it is anticipated that the patient will be medically stable for discharge from the hospital within 2 midnights of admission.    Marcelyn Bruins MD [facility]  How to contact the [facility] Attending or Consulting provider [place] or covering provider during after hours [place], for this patient?   Check the care team in [facility] and look for a) attending/consulting [facility] provider listed and b) the [facility] team listed Log into [email or web address] and use [place]'s universal password to access. If you do not have the password, please contact the hospital operator. Locate the [facility] provider you are looking for under [facility] and page to a number that you can be directly reached. If you still have difficulty reaching the provider, please page the [facility] (Director on Call) for the Hospitalists listed on amion for assistance.  02/07/2022, 8:18 PM

## 2022-02-07 NOTE — ED Notes (Signed)
Pt SpO2 87%. Nurse made aware.

## 2022-02-08 ENCOUNTER — Observation Stay (HOSPITAL_BASED_OUTPATIENT_CLINIC_OR_DEPARTMENT_OTHER): Payer: Medicare Other

## 2022-02-08 DIAGNOSIS — I5023 Acute on chronic systolic (congestive) heart failure: Secondary | ICD-10-CM

## 2022-02-08 DIAGNOSIS — R7989 Other specified abnormal findings of blood chemistry: Secondary | ICD-10-CM | POA: Diagnosis present

## 2022-02-08 DIAGNOSIS — I110 Hypertensive heart disease with heart failure: Secondary | ICD-10-CM | POA: Diagnosis present

## 2022-02-08 DIAGNOSIS — Z87891 Personal history of nicotine dependence: Secondary | ICD-10-CM | POA: Diagnosis not present

## 2022-02-08 DIAGNOSIS — R0602 Shortness of breath: Secondary | ICD-10-CM | POA: Diagnosis not present

## 2022-02-08 DIAGNOSIS — R0902 Hypoxemia: Secondary | ICD-10-CM | POA: Diagnosis present

## 2022-02-08 DIAGNOSIS — E876 Hypokalemia: Secondary | ICD-10-CM | POA: Diagnosis present

## 2022-02-08 DIAGNOSIS — E039 Hypothyroidism, unspecified: Secondary | ICD-10-CM | POA: Diagnosis present

## 2022-02-08 DIAGNOSIS — I5030 Unspecified diastolic (congestive) heart failure: Secondary | ICD-10-CM

## 2022-02-08 DIAGNOSIS — Z7982 Long term (current) use of aspirin: Secondary | ICD-10-CM | POA: Diagnosis not present

## 2022-02-08 DIAGNOSIS — K2100 Gastro-esophageal reflux disease with esophagitis, without bleeding: Secondary | ICD-10-CM | POA: Diagnosis present

## 2022-02-08 DIAGNOSIS — I959 Hypotension, unspecified: Secondary | ICD-10-CM

## 2022-02-08 DIAGNOSIS — Z8673 Personal history of transient ischemic attack (TIA), and cerebral infarction without residual deficits: Secondary | ICD-10-CM | POA: Diagnosis not present

## 2022-02-08 DIAGNOSIS — F1010 Alcohol abuse, uncomplicated: Secondary | ICD-10-CM | POA: Diagnosis present

## 2022-02-08 DIAGNOSIS — Z825 Family history of asthma and other chronic lower respiratory diseases: Secondary | ICD-10-CM | POA: Diagnosis not present

## 2022-02-08 DIAGNOSIS — I2510 Atherosclerotic heart disease of native coronary artery without angina pectoris: Secondary | ICD-10-CM | POA: Diagnosis present

## 2022-02-08 DIAGNOSIS — E782 Mixed hyperlipidemia: Secondary | ICD-10-CM | POA: Diagnosis present

## 2022-02-08 DIAGNOSIS — I739 Peripheral vascular disease, unspecified: Secondary | ICD-10-CM | POA: Diagnosis present

## 2022-02-08 DIAGNOSIS — I5043 Acute on chronic combined systolic (congestive) and diastolic (congestive) heart failure: Secondary | ICD-10-CM | POA: Diagnosis present

## 2022-02-08 DIAGNOSIS — J9601 Acute respiratory failure with hypoxia: Secondary | ICD-10-CM | POA: Diagnosis present

## 2022-02-08 DIAGNOSIS — J441 Chronic obstructive pulmonary disease with (acute) exacerbation: Secondary | ICD-10-CM | POA: Diagnosis present

## 2022-02-08 DIAGNOSIS — D696 Thrombocytopenia, unspecified: Secondary | ICD-10-CM | POA: Diagnosis present

## 2022-02-08 DIAGNOSIS — Z95810 Presence of automatic (implantable) cardiac defibrillator: Secondary | ICD-10-CM | POA: Diagnosis not present

## 2022-02-08 DIAGNOSIS — D539 Nutritional anemia, unspecified: Secondary | ICD-10-CM | POA: Diagnosis present

## 2022-02-08 DIAGNOSIS — Z79899 Other long term (current) drug therapy: Secondary | ICD-10-CM | POA: Diagnosis not present

## 2022-02-08 DIAGNOSIS — Z888 Allergy status to other drugs, medicaments and biological substances status: Secondary | ICD-10-CM | POA: Diagnosis not present

## 2022-02-08 DIAGNOSIS — Z8249 Family history of ischemic heart disease and other diseases of the circulatory system: Secondary | ICD-10-CM | POA: Diagnosis not present

## 2022-02-08 LAB — ECHOCARDIOGRAM COMPLETE
AR max vel: 2.51 cm2
AV Area VTI: 2.03 cm2
AV Area mean vel: 2.53 cm2
AV Mean grad: 2 mmHg
AV Peak grad: 4 mmHg
Ao pk vel: 1 m/s
Area-P 1/2: 12.64 cm2
Calc EF: 30.6 %
Height: 69 in
MV M vel: 3.93 m/s
MV Peak grad: 61.7 mmHg
P 1/2 time: 403 msec
Radius: 0.3 cm
S' Lateral: 5 cm
Single Plane A2C EF: 32.2 %
Single Plane A4C EF: 28.4 %
Weight: 2721.6 oz

## 2022-02-08 LAB — COMPREHENSIVE METABOLIC PANEL
ALT: 26 U/L (ref 0–44)
AST: 51 U/L — ABNORMAL HIGH (ref 15–41)
Albumin: 3.1 g/dL — ABNORMAL LOW (ref 3.5–5.0)
Alkaline Phosphatase: 67 U/L (ref 38–126)
Anion gap: 9 (ref 5–15)
BUN: 7 mg/dL — ABNORMAL LOW (ref 8–23)
CO2: 26 mmol/L (ref 22–32)
Calcium: 8.9 mg/dL (ref 8.9–10.3)
Chloride: 105 mmol/L (ref 98–111)
Creatinine, Ser: 0.79 mg/dL (ref 0.44–1.00)
GFR, Estimated: >60 mL/min (ref >60)
Glucose, Bld: 96 mg/dL (ref 70–99)
Potassium: 3.6 mmol/L (ref 3.5–5.1)
Sodium: 140 mmol/L (ref 135–145)
Total Bilirubin: 0.9 mg/dL (ref 0.3–1.2)
Total Protein: 6.8 g/dL (ref 6.5–8.1)

## 2022-02-08 LAB — CBC
HCT: 36.1 % (ref 36.0–46.0)
Hemoglobin: 11.9 g/dL — ABNORMAL LOW (ref 12.0–15.0)
MCH: 33.5 pg (ref 26.0–34.0)
MCHC: 33 g/dL (ref 30.0–36.0)
MCV: 101.7 fL — ABNORMAL HIGH (ref 80.0–100.0)
Platelets: 113 10*3/uL — ABNORMAL LOW (ref 150–400)
RBC: 3.55 MIL/uL — ABNORMAL LOW (ref 3.87–5.11)
RDW: 14.5 % (ref 11.5–15.5)
WBC: 8.7 10*3/uL (ref 4.0–10.5)
nRBC: 0 % (ref 0.0–0.2)

## 2022-02-08 LAB — HIV ANTIBODY (ROUTINE TESTING W REFLEX): HIV Screen 4th Generation wRfx: NONREACTIVE

## 2022-02-08 MED ORDER — THIAMINE HCL 100 MG PO TABS
100.0000 mg | ORAL_TABLET | Freq: Every day | ORAL | Status: DC
  Administered 2022-02-08 – 2022-02-09 (×2): 100 mg via ORAL
  Filled 2022-02-08 (×2): qty 1

## 2022-02-08 MED ORDER — LEVOTHYROXINE SODIUM 25 MCG PO TABS
25.0000 ug | ORAL_TABLET | Freq: Every day | ORAL | Status: DC
  Administered 2022-02-09: 25 ug via ORAL
  Filled 2022-02-08: qty 1

## 2022-02-08 MED ORDER — SACUBITRIL-VALSARTAN 24-26 MG PO TABS: 1.0000 | ORAL_TABLET | Freq: Two times a day (BID) | ORAL | Status: DC

## 2022-02-08 MED ORDER — SODIUM CHLORIDE 0.9 % IV SOLN: INTRAVENOUS | Status: DC | PRN

## 2022-02-08 MED ORDER — MAGNESIUM SULFATE 4 GM/100ML IV SOLN
4.0000 g | Freq: Once | INTRAVENOUS | Status: AC
  Administered 2022-02-08: 4 g via INTRAVENOUS
  Filled 2022-02-08: qty 100

## 2022-02-08 MED ORDER — ORAL CARE MOUTH RINSE
15.0000 mL | Freq: Two times a day (BID) | OROMUCOSAL | Status: DC
  Administered 2022-02-08 – 2022-02-09 (×3): 15 mL via OROMUCOSAL

## 2022-02-08 MED ORDER — FOLIC ACID 1 MG PO TABS
1.0000 mg | ORAL_TABLET | Freq: Every day | ORAL | Status: DC
  Administered 2022-02-08 – 2022-02-09 (×2): 1 mg via ORAL
  Filled 2022-02-08 (×2): qty 1

## 2022-02-08 MED ORDER — THIAMINE HCL 100 MG/ML IJ SOLN: 100.0000 mg | Freq: Every day | INTRAMUSCULAR | Status: DC

## 2022-02-08 MED ORDER — ADULT MULTIVITAMIN W/MINERALS CH
1.0000 | ORAL_TABLET | Freq: Every day | ORAL | Status: DC
  Administered 2022-02-08 – 2022-02-09 (×2): 1 via ORAL
  Filled 2022-02-08 (×2): qty 1

## 2022-02-08 MED ORDER — POTASSIUM CHLORIDE CRYS ER 20 MEQ PO TBCR
40.0000 meq | EXTENDED_RELEASE_TABLET | Freq: Once | ORAL | Status: AC
  Administered 2022-02-08: 40 meq via ORAL
  Filled 2022-02-08: qty 2

## 2022-02-08 MED ORDER — CARVEDILOL 3.125 MG PO TABS
3.1250 mg | ORAL_TABLET | Freq: Two times a day (BID) | ORAL | Status: DC
  Administered 2022-02-09: 3.125 mg via ORAL
  Filled 2022-02-08: qty 1

## 2022-02-08 MED ORDER — LORAZEPAM 1 MG PO TABS: 1.0000 mg | ORAL_TABLET | ORAL | Status: DC | PRN

## 2022-02-08 MED ORDER — LORAZEPAM 2 MG/ML IJ SOLN: 1.0000 mg | INTRAMUSCULAR | Status: DC | PRN

## 2022-02-08 NOTE — Progress Notes (Signed)
PROGRESS NOTE   [name]  MBT:[id number]    DOB: November 29, 1955    DOA: 02/07/2022  PCP: Myrtie Neither, PA-C   I have briefly reviewed patients previous medical records in [facility].  Chief Complaint  Patient presents with   Shortness of Breath    Brief Narrative:  66 year old female, lives with her friend, independent, complex medical history significant for CAD, chronic systolic CHF, s/p ICD placement, COPD not on home oxygen, former tobacco use, alcohol dependence, hyperlipidemia, previous ventricular tachycardia, PAD, HLD, HTN, TIA, elevated ferritin being evaluated by hematology and hypothyroidism, presented to the [facility] ED on 02/07/2022 with several days of progressively worsening dyspnea on exertion, and bilateral leg edema.  She reportedly contacted her cardiologist office, ICD was checked and was told to have "fluid in her lungs", was advised to continue Aldactone with no medication changes.  Admitted for acute respiratory failure with hypoxia secondary to acute on chronic systolic CHF.  Treating with low-dose IV diuretics, course complicated by hypotension, asymptomatic.  Cardiology consulted for assistance 6/2.   Assessment & Plan:  Principal Problem:   CHF exacerbation ([place]) Active Problems:   Elevated lipids   HYPERTENSION, BENIGN   CAD, NATIVE VESSEL   SYSTOLIC HEART FAILURE, CHRONIC   PVD (peripheral vascular disease) (HCC)   Automatic implantable cardioverter-defibrillator in situ   TIA (transient ischemic attack)   COPD with acute exacerbation (HCC)   Alcohol use ([place])   Acute respiratory failure with hypoxia (HCC)   Hypokalemia   Acute on chronic systolic CHF, s/p ICD: TTE 6/2: LVEF 40-45% with global hypokinesis, grade 1 diastolic dysfunction, no change from prior study. BNP 766.2.  High-sensitivity troponin negative. CTA chest without PE Bilateral lower extremity venous Dopplers: No DVT Treated with IV Lasix 20 mg every 12 hours Was  continued on carvedilol 12.5 Mg twice daily, Entresto 1 tab twice daily and Aldactone 12.5 Mg daily -1.12 L thus far Volume status has significantly improved, no peripheral edema, few rales on lung exam with still some dyspnea but better than before Hypotensive with BP 80/58 on 6/2 afternoon but asymptomatic Discontinued IV Lasix (received this a.m. dose) and Entresto.  Added holding parameters for carvedilol and reduced dose to 6.25 twice daily. Cardiology consulted for assistance and await their input.  Will need to check a CD interrogation.  Acute respiratory failure with hypoxia Secondary to decompensated CHF Presented with tachypnea in the 20s, tachycardia in the 120s and initial oxygen saturation of 87% on room air Placed on 2 L/min [place] oxygen. Wean oxygen as tolerated.  Hypokalemia: Continue to replace and attempt to keep >4.  Replace low magnesium.  Hypomagnesemia: Magnesium 1.4.  Replace aggressively and attempt to keep >2.  Follow in AM.  Essential hypertension: Meds as above. Now hypotensive.  Holding Entresto and reduced carvedilol with parameters Monitor closely  Hyperlipidemia Continue statins  Macrocytic anemia ?  Related to alcohol use disorder Serum iron 173, ferritin 1422, B12: 268, folate 6.8 Stable.  Thrombocytopenia Suspected due to alcohol use disorder. Stable.  Elevated ferritin: Being evaluated by outpatient hematology, Dr. Julien Nordmann.  COPD/former tobacco use: No clinical bronchospasm.  Not on home oxygen PTA. Quit smoking 4 years ago  Alcohol use disorder CIWA protocol initiated.  PAD/CAD No angina or claudication symptoms Continue aspirin and atorvastatin.  Continue reduced dose beta-blockers.  History of TIA Continue aspirin and atorvastatin  Suspected esophagitis per CTA chest PPI. Outpatient follow-up regarding need for endoscopy evaluation.  Body mass index  is 25.12 kg/m.   DVT prophylaxis: enoxaparin (LOVENOX) injection 40 mg  Start: 02/07/22 2200     Code Status: Full Code:  Family Communication: None at bedside Disposition:  Status is: Observation The patient will require care spanning > 2 midnights and should be moved to inpatient because: IV Lasix, now hypotensive, will need med adjustment and management.  Cardiology consultation.     Consultants:   Cardiology  Procedures:     Antimicrobials:      Subjective:  Feels much better.  Leg swelling have significantly improved.  Breathing has significantly improved but not yet back to baseline.  No chest pain or leg pains reported.  Objective:   Vitals:   02/08/22 0554 02/08/22 0821 02/08/22 1337 02/08/22 1400  BP: 108/85 106/71 (!) 77/55 (!) 80/58  Pulse: 85 83 98   Resp: '20 19 20   '$ Temp: 97.8 F (36.6 C) 97.8 F (36.6 C) 98.1 F (36.7 C)   TempSrc: Oral Oral Oral   SpO2: 99% 100% 100%   Weight:      Height:        General exam: Middle-age female, moderately built and nourished lying propped up in bed with some DOE. Respiratory system: Few fine basal crackles but otherwise clear to auscultation without wheezing or rhonchi.  Mild DOE while speaking with me. Cardiovascular system: S1 & S2 heard, RRR. No JVD, murmurs, rubs, gallops or clicks. No pedal edema.  Telemetry personally reviewed: Sinus rhythm. Gastrointestinal system: Abdomen is nondistended, soft and nontender. No organomegaly or masses felt. Normal bowel sounds heard. Central nervous system: Alert and oriented. No focal neurological deficits. Extremities: Symmetric 5 x 5 power. Skin: No rashes, lesions or ulcers Psychiatry: Judgement and insight appear normal. Mood & affect appropriate.     Data Reviewed:   I have personally reviewed following labs and imaging studies   CBC: Recent Labs  Lab 02/07/22 1356 02/08/22 0429  WBC 5.8 8.7  NEUTROABS 2.3  --   HGB 11.7* 11.9*  HCT 36.0 36.1  MCV 103.2* 101.7*  PLT 117* 113*    Basic Metabolic Panel: Recent Labs  Lab  02/07/22 1356 02/08/22 0429  NA 139 140  K 3.1* 3.6  CL 108 105  CO2 23 26  GLUCOSE 111* 96  BUN 6* 7*  CREATININE 0.70 0.79  CALCIUM 8.5* 8.9  MG 1.4*  --     Liver Function Tests: Recent Labs  Lab 02/07/22 1356 02/08/22 0429  AST 56* 51*  ALT 29 26  ALKPHOS 94 67  BILITOT 0.8 0.9  PROT 7.0 6.8  ALBUMIN 3.3* 3.1*    CBG: No results for input(s): GLUCAP in the last 168 hours.  Microbiology Studies:  No results found for this or any previous visit (from the past 240 hour(s)).  Radiology Studies:  CT Angio Chest PE W and/or Wo Contrast  Result Date: 02/07/2022 CLINICAL DATA:  Pulmonary embolism Shortness of breath EXAM: CT ANGIOGRAPHY CHEST WITH CONTRAST TECHNIQUE: Multidetector CT imaging of the chest was performed using the standard protocol during bolus administration of intravenous contrast. Multiplanar CT image reconstructions and MIPs were obtained to evaluate the vascular anatomy. RADIATION DOSE REDUCTION: This exam was performed according to the departmental dose-optimization program which includes automated exposure control, adjustment of the mA and/or kV according to patient size and/or use of iterative reconstruction technique. CONTRAST:  75 mL OMNIPAQUE IOHEXOL 350 MG/ML SOLN COMPARISON:  12/02/2021 FINDINGS: Cardiovascular: Single lead left chest wall AICD is seen on scout image. Heart  size is within normal limits. Evaluation of the lung bases limited by motion. No pulmonary artery embolism is identified. Mediastinum/Nodes: No enlarged mediastinal, hilar, or axillary lymph nodes. Mild diffuse thickening of the wall the esophagus, particularly in the distal segment. This is best appreciated on image 113 of series 4. Lungs/Pleura: Moderate emphysematous changes the lungs. No focal airspace opacity to indicate pneumonia. Upper Abdomen: Evaluation of the upper abdomen is significantly limited by motion. No acute abnormality is seen. Musculoskeletal: Evaluation for fractures  is limited by motion. No definitive acute abnormality of the visualized osseous structures. Review of the MIP images confirms the above findings. IMPRESSION: 1. No pulmonary artery embolism. 2. Evaluation for fractures is significantly limited due to motion. If there is high clinical suspicion for rib or spine fractures, repeat noncontrast chest CT should be performed. 3. Diffuse thickening of the distal esophageal wall most consistent with esophagitis. Correlation with endoscopy should be performed on nonemergent basis to evaluate for underlying mass. Electronically Signed   By: Miachel Roux M.D.   On: 02/07/2022 16:27   DG Chest Portable 1 View  Result Date: 02/07/2022 CLINICAL DATA:  Provided history: Shortness of breath. Additional history provided: Shortness of breath, leg swelling. EXAM: PORTABLE CHEST 1 VIEW COMPARISON:  CT angiogram chest/abdomen/pelvis 12/02/2021. Prior chest radiographs 12/02/2021 and earlier. FINDINGS: Redemonstrated left chest single lead AICD. Heart size within normal limits. Aortic atherosclerosis. Prominence of the interstitial lung markings, greatest at the lung bases, suspicious for interstitial edema. No evidence of pleural effusion or pneumothorax. No acute bony abnormality identified. IMPRESSION: Prominence of the interstitial lung markings, greatest at the lung bases, suspicious for interstitial edema. Aortic Atherosclerosis (ICD10-I70.0). Electronically Signed   By: Kellie Simmering D.O.   On: 02/07/2022 13:47   ECHOCARDIOGRAM COMPLETE  Result Date: 02/08/2022    ECHOCARDIOGRAM REPORT   Patient Name:   KIERYN BURTIS Date of Exam: 02/08/2022 Medical Rec #:  [id number]        Height:       69.0 in Accession #:    [id number]       Weight:       170.1 lb Date of Birth:  1956-01-17         BSA:          1.929 m Patient Age:    49 years         BP:           108/85 mmHg Patient Gender: F                HR:           87 bpm. Exam Location:  Inpatient Procedure: 2D Echo, Cardiac  Doppler and Color Doppler Indications:    Congestive heart failure  History:        Patient has prior history of Echocardiogram examinations, most                 recent 01/20/2019. CHF, CAD, Defibrillator, COPD; Risk                 Factors:Hypertension. Tobacco and ETOH abuse.  Sonographer:    Joette Catching RCS Referring Phys: [id number] [place]  1. Left ventricular ejection fraction, by estimation, is 40 to 45%. The left ventricle has mildly decreased function. The left ventricle demonstrates global hypokinesis. Left ventricular diastolic parameters are consistent with Grade I diastolic dysfunction (impaired relaxation).  2. Right ventricular systolic function is normal. The right ventricular size is normal.  3. Left atrial size was mildly dilated.  4. The mitral valve is normal in structure. Mild mitral valve regurgitation.  5. The aortic valve is normal in structure. Aortic valve regurgitation is mild.  6. Aortic no significant ascending aortic aneurysm.  7. The inferior vena cava is dilated in size with >50% respiratory variability, suggesting right atrial pressure of 8 mmHg. Comparison(s): No significant change from prior study. FINDINGS  Left Ventricle: Left ventricular ejection fraction, by estimation, is 40 to 45%. The left ventricle has mildly decreased function. The left ventricle demonstrates global hypokinesis. The left ventricular internal cavity size was normal in size. There is  borderline left ventricular hypertrophy. Left ventricular diastolic parameters are consistent with Grade I diastolic dysfunction (impaired relaxation). Right Ventricle: The right ventricular size is normal. No increase in right ventricular wall thickness. Right ventricular systolic function is normal. Left Atrium: Left atrial size was mildly dilated. Right Atrium: Right atrial size was normal in size. Pericardium: There is no evidence of pericardial effusion. Mitral Valve: The mitral valve is normal in  structure. Mild mitral valve regurgitation. Tricuspid Valve: The tricuspid valve is normal in structure. Tricuspid valve regurgitation is mild. Aortic Valve: The aortic valve is normal in structure. Aortic valve regurgitation is mild. Aortic regurgitation PHT measures 403 msec. Aortic valve mean gradient measures 2.0 mmHg. Aortic valve peak gradient measures 4.0 mmHg. Aortic valve area, by VTI measures 2.03 cm. Pulmonic Valve: The pulmonic valve was grossly normal. Pulmonic valve regurgitation is trivial. Aorta: No significant ascending aortic aneurysm. Venous: The inferior vena cava is dilated in size with greater than 50% respiratory variability, suggesting right atrial pressure of 8 mmHg. IAS/Shunts: No atrial level shunt detected by color flow Doppler. Additional Comments: A device lead is visualized.  LEFT VENTRICLE PLAX 2D LVIDd:         5.40 cm      Diastology LVIDs:         5.00 cm      LV e' medial:    4.68 cm/s LV PW:         1.10 cm      LV E/e' medial:  16.2 LV IVS:        1.00 cm      LV e' lateral:   3.81 cm/s LVOT diam:     2.10 cm      LV E/e' lateral: 19.9 LV SV:         34 LV SV Index:   18 LVOT Area:     3.46 cm  LV Volumes (MOD) LV vol d, MOD A2C: 107.0 ml LV vol d, MOD A4C: 98.0 ml LV vol s, MOD A2C: 72.5 ml LV vol s, MOD A4C: 70.2 ml LV SV MOD A2C:     34.5 ml LV SV MOD A4C:     98.0 ml LV SV MOD BP:      31.4 ml RIGHT VENTRICLE             IVC RV Basal diam:  3.20 cm     IVC diam: 2.20 cm RV Mid diam:    1.90 cm RV S prime:     11.20 cm/s TAPSE (M-mode): 2.4 cm LEFT ATRIUM             Index        RIGHT ATRIUM           Index LA diam:        3.60 cm 1.87 cm/m   RA Area:  13.60 cm LA Vol (A2C):   49.4 ml 25.61 ml/m  RA Volume:   35.90 ml  18.61 ml/m LA Vol (A4C):   51.7 ml 26.81 ml/m LA Biplane Vol: 55.3 ml 28.67 ml/m  AORTIC VALVE                    PULMONIC VALVE AV Area (Vmax):    2.51 cm     PV Vmax:          0.80 m/s AV Area (Vmean):   2.53 cm     PV Peak grad:     2.5 mmHg AV  Area (VTI):     2.03 cm     PR End Diast Vel: 8.29 msec AV Vmax:           100.00 cm/s AV Vmean:          74.600 cm/s AV VTI:            0.169 m AV Peak Grad:      4.0 mmHg AV Mean Grad:      2.0 mmHg LVOT Vmax:         72.50 cm/s LVOT Vmean:        54.500 cm/s LVOT VTI:          0.099 m LVOT/AV VTI ratio: 0.59 AI PHT:            403 msec  AORTA Ao Root diam: 3.10 cm Ao Asc diam:  3.50 cm MITRAL VALVE                  TRICUSPID VALVE MV Area (PHT): 12.64 cm      TR Peak grad:   33.2 mmHg MV Decel Time: 60 msec        TR Vmax:        288.00 cm/s MR Peak grad:    61.7 mmHg MR Mean grad:    48.0 mmHg    SHUNTS MR Vmax:         392.67 cm/s  Systemic VTI:  0.10 m MR Vmean:        333.0 cm/s   Systemic Diam: 2.10 cm MR PISA:         0.57 cm MR PISA Eff ROA: 6 mm MR PISA Radius:  0.30 cm MV E velocity: 75.80 cm/s MV A velocity: 90.55 cm/s MV E/A ratio:  0.84 MV A Prime:    12.0 cm/s Phineas Inches Electronically signed by Phineas Inches Signature Date/Time: 02/08/2022/10:08:35 AM    Final    VAS [place] LOWER EXTREMITY VENOUS (DVT) (ONLY [facility] & [facility])  Result Date: 02/07/2022  Lower Venous DVT Study Patient Name:  JERRI GLAUSER  Date of Exam:   02/07/2022 Medical Rec #: [id number]         Accession #:    [id number] Date of Birth: 1956-02-23          Patient Gender: F Patient Age:   70 years Exam Location:  [facility] Procedure:      VAS [place] LOWER EXTREMITY VENOUS (DVT) Referring Phys: Rayna Sexton --------------------------------------------------------------------------------  Indications: SOB, swelling.  Comparison Study: No prior venous studies. Extensive arterial history. Performing Technologist: Darlin Coco RDMS, RVT  Examination Guidelines: A complete evaluation includes B-mode imaging, spectral Doppler, color Doppler, and power Doppler as needed of all accessible portions of each vessel. Bilateral testing is considered an integral part of a complete examination. Limited examinations for reoccurring indications may be  performed as noted. The  reflux portion of the exam is performed with the patient in reverse Trendelenburg.  +---------+---------------+---------+-----------+----------+--------------+ RIGHT    CompressibilityPhasicitySpontaneityPropertiesThrombus Aging +---------+---------------+---------+-----------+----------+--------------+ CFV      Full           Yes      Yes                                 +---------+---------------+---------+-----------+----------+--------------+ SFJ      Full                                                        +---------+---------------+---------+-----------+----------+--------------+ FV Prox  Full                                                        +---------+---------------+---------+-----------+----------+--------------+ FV Mid   Full                                                        +---------+---------------+---------+-----------+----------+--------------+ FV DistalFull                                                        +---------+---------------+---------+-----------+----------+--------------+ PFV      Full                                                        +---------+---------------+---------+-----------+----------+--------------+ POP      Full           Yes      Yes                                 +---------+---------------+---------+-----------+----------+--------------+ PTV      Full                                                        +---------+---------------+---------+-----------+----------+--------------+ PERO     Full                                                        +---------+---------------+---------+-----------+----------+--------------+ Gastroc  Full                                                        +---------+---------------+---------+-----------+----------+--------------+   +---------+---------------+---------+-----------+----------+--------------+  LEFT      CompressibilityPhasicitySpontaneityPropertiesThrombus Aging +---------+---------------+---------+-----------+----------+--------------+ CFV      Full           Yes      Yes                                 +---------+---------------+---------+-----------+----------+--------------+ SFJ      Full                                                        +---------+---------------+---------+-----------+----------+--------------+ FV Prox  Full                                                        +---------+---------------+---------+-----------+----------+--------------+ FV Mid   Full                                                        +---------+---------------+---------+-----------+----------+--------------+ FV DistalFull                                                        +---------+---------------+---------+-----------+----------+--------------+ PFV      Full                                                        +---------+---------------+---------+-----------+----------+--------------+ POP      Full           Yes      Yes                                 +---------+---------------+---------+-----------+----------+--------------+ PTV      Full                                                        +---------+---------------+---------+-----------+----------+--------------+ PERO     Full                                                        +---------+---------------+---------+-----------+----------+--------------+ Gastroc  Full                                                        +---------+---------------+---------+-----------+----------+--------------+  Summary: RIGHT: - There is no evidence of deep vein thrombosis in the lower extremity.  - No cystic structure found in the popliteal fossa.  LEFT: - There is no evidence of deep vein thrombosis in the lower extremity.  - No cystic structure found in the popliteal fossa.  *See  table(s) above for measurements and observations. Electronically signed by Orlie Pollen on 02/07/2022 at 5:24:38 PM.    Final     Scheduled Meds:    aspirin EC  81 mg Oral Daily   atorvastatin  40 mg Oral Daily   carvedilol  12.5 mg Oral BID WC   enoxaparin (LOVENOX) injection  40 mg Subcutaneous Q24H   furosemide  20 mg Intravenous BID   mouth rinse  15 mL Mouth Rinse BID   sacubitril-valsartan  1 tablet Oral BID   sodium chloride flush  3 mL Intravenous Q12H   spironolactone  12.5 mg Oral Daily    Continuous Infusions:     LOS: 0 days     Vernell Leep, MD,  FACP, [facility], [facility], [facility] (Care Management Physician Certified) [place]  To contact the attending provider between 7A-7P or the covering provider during after hours 7P-7A, please log into the web site [email or web address] and access using universal [place] password for that web site. If you do not have the password, please call the hospital operator.  02/08/2022, 2:09 PM

## 2022-02-08 NOTE — Progress Notes (Signed)
Echocardiogram 2D Echocardiogram has been performed.  Joette Catching 02/08/2022, 9:09 AM

## 2022-02-08 NOTE — Progress Notes (Signed)
   02/08/22 1800  Vitals  Temp 97.6 F (36.4 C)  Temp Source Oral  BP 105/79  MAP (mmHg) 88  BP Location Right Arm  BP Method Automatic  Patient Position (if appropriate) Lying  Pulse Rate 82  Pulse Rate Source Dinamap  Resp 19  MEWS COLOR  MEWS Score Color Green  Oxygen Therapy  SpO2 100 %  O2 Device Nasal Cannula  O2 Flow Rate (L/min) 2 L/min  MEWS Score  MEWS Temp 0  MEWS Systolic 0  MEWS Pulse 0  MEWS RR 0  MEWS LOC 0  MEWS Score 0   BP resolved, as of 18:00, BP is 105/79.  Layla Maw, RN

## 2022-02-08 NOTE — Progress Notes (Signed)
  Transition of Care [facility]) Screening Note   Patient Details  Name: [name] Date of Birth: April 15, 1956   Transition of Care [facility]) CM/SW Contact:    Dessa Phi, RN Phone Number: 02/08/2022, 2:07 PM    Transition of Care Department [facility]) has reviewed patient and no TOC needs have been identified at this time. We will continue to monitor patient advancement through interdisciplinary progression rounds. If new patient transition needs arise, please place a TOC consult.

## 2022-02-08 NOTE — TOC Initial Note (Signed)
Transition of Care [facility]) - Initial/Assessment Note    Patient Details  Name: [name] MRN: [id number] Date of Birth: Nov 20, 1955  Transition of Care [facility]) CM/SW Contact:    Dessa Phi, RN Phone Number: 02/08/2022, 3:16 PM  Clinical Narrative: Patient declines any etoh resources.                   Expected Discharge Plan: Home/Self Care Barriers to Discharge: Continued Medical Work up   Patient Goals and CMS Choice Patient states their goals for this hospitalization and ongoing recovery are:: home CMS Medicare.gov Compare Post Acute Care list provided to:: Patient Choice offered to / list presented to : Patient  Expected Discharge Plan and Services Expected Discharge Plan: Home/Self Care   Discharge Planning Services: CM Consult   Living arrangements for the past 2 months: Single Family Home                                      Prior Living Arrangements/Services Living arrangements for the past 2 months: Single Family Home Lives with:: Spouse Patient language and need for interpreter reviewed:: Yes Do you feel safe going back to the place where you live?: Yes      Need for Family Participation in Patient Care: Yes (Comment) Care giver support system in place?: Yes (comment)   Criminal Activity/Legal Involvement Pertinent to Current Situation/Hospitalization: No - Comment as needed  Activities of Daily Living Home Assistive Devices/Equipment: Other (Comment) (device to check defibilator with) ADL Screening (condition at time of admission) Patient's cognitive ability adequate to safely complete daily activities?: Yes Is the patient deaf or have difficulty hearing?: No Does the patient have difficulty seeing, even when wearing glasses/contacts?: No Does the patient have difficulty concentrating, remembering, or making decisions?: No Patient able to express need for assistance with ADLs?: Yes Does the patient have difficulty dressing or bathing?:  No Independently performs ADLs?: Yes (appropriate for developmental age) Does the patient have difficulty walking or climbing stairs?: Yes (gets sob now with going up steps) Weakness of Legs: Both Weakness of Arms/Hands: Both  Permission Sought/Granted Permission sought to share information with : Case Manager Permission granted to share information with : Yes, Verbal Permission Granted  Share Information with NAME: Case Manager           Emotional Assessment Appearance:: Appears stated age Attitude/Demeanor/Rapport: Gracious Affect (typically observed): Accepting Orientation: : Oriented to Self, Oriented to Place, Oriented to  Time, Oriented to Situation Alcohol / Substance Use: Alcohol Use Psych Involvement: No (comment)  Admission diagnosis:  Hypoxia [R09.02] CHF exacerbation ([place]) [I50.9] Acute on chronic congestive heart failure, unspecified heart failure type [facility]) [I50.9] Patient Active Problem List   Diagnosis Date Noted   CHF exacerbation ([place]) 02/07/2022   Hypokalemia 02/07/2022   Iron overload 01/29/2022   Diverticulosis 12/02/2021   Abnormal CT scan, esophagus    CAD (coronary artery disease)    Acute respiratory failure ([facility])    Acute on chronic systolic heart failure ([facility])    Hyperglycemia    COPD exacerbation ([place])    CAP (community acquired pneumonia) 05/17/2016   Essential hypertension    COPD with acute exacerbation ([place]) 83/66/2947   Systolic heart failure ([place]) 12/02/2014   Dyspnea 12/02/2014   Alcohol use ([place]) 12/02/2014   Elevated hemoglobin ([place]) 12/02/2014   Acute bronchitis 12/02/2014   Acute respiratory failure with hypoxia ([place])  Malnutrition of moderate degree ([place]) 05/19/2014   Right facial numbness 05/18/2014   Weakness of right lower extremity 05/18/2014   TIA (transient ischemic attack) 05/18/2014   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   Smoker 08/11/2012   PVD (peripheral vascular disease) ([place])  08/11/2012   VENTRICULAR TACHYCARDIA 05/21/2010   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

## 2022-02-08 NOTE — Consult Note (Signed)
Cardiology Consultation:   Patient ID: RANIYAH CURENTON MRN: [id number]; DOB: [birth year]  Admit date: 02/07/2022 Date of Consult: 02/08/2022  PCP:  Myrtie Neither, PA-C   [place] [facility] Cardiologist:  Jenkins Rouge, MD  Electrophysiologist:  Cristopher Peru, MD       Patient Profile:   GAIL VENDETTI is a 66 y.o. female with a hx of CAD s/p PCI OM, chronic systolic and diastolic heart failure, PAD, dilated cardiomyopathy and VT s/p ICD who is being seen 02/08/2022 for the evaluation of heart failure at the request of Dr. Algis Liming.  History of Present Illness:   Ms. Raether called into the office 02/05/22 with concerned of L > R foot swelling. Her device was interrogated and these were reviewed. Her optivol fluid index has been rising steadily since about late March/early April. Plan had been to recheck optovol on device and then have clinic follow up. However, on 6/1 she became very short of breath walking in the store. She presented to ER and was noted to initially have O2 sats in the 80s on presentation, improved with O2 by nasal cannula.  She does not have a scale or a BP cuff at home. She has been eating and drinking normally. No fevers/chills. No chest pain. Sleeping on 1 pillow at night, no PND. Only felt her heart going fast when she felt very short of breath.  Reviewed her home med regimen, she was taking as instructed at home. Denies dizziness/lightheadedness or syncope.  She feels that she has already lost a lot of fluid in her legs. Still on O2 by nasal cannula, not on O2 at home.   Past Medical History:  Diagnosis Date   Acute on chronic systolic heart failure ([place])    Acute respiratory failure (H[facility])    Automatic implantable cardioverter-defibrillator in situ    CAD (coronary artery disease)    a. Inf-post MI 2008 s/p BMS to large marginal of Cx.    COPD (chronic obstructive pulmonary disease) ([place])    ETOH abuse    Hyperglycemia    HYPERLIPIDEMIA-MIXED 02/07/2009    HYPERTENSION, BENIGN 08/22/2009   PVD (peripheral vascular disease) ([place])    a. Evaluated by Dr. [name] Anon 08/2012.   SYSTOLIC HEART FAILURE, CHRONIC 02/07/2009   a. EF 30% 2010, 29% by MRI 08/2012. b. s/p prophylactic Medtronic ICD implantation 10/2012.   TIA (transient ischemic attack) 05/18/2014   Tobacco abuse    VENTRICULAR TACHYCARDIA 05/21/2010    Past Surgical History:  Procedure Laterality Date   CARDIAC DEFIBRILLATOR PLACEMENT  10/2012   CESAREAN SECTION  1977; 1982   COLONOSCOPY     CYSTOSCOPY W/ STONE MANIPULATION  1990's   ESOPHAGOGASTRODUODENOSCOPY (EGD) WITH PROPOFOL N/A 08/04/2018   Procedure: ESOPHAGOGASTRODUODENOSCOPY (EGD) WITH PROPOFOL;  Surgeon: Gatha Mayer, MD;  Location: [facility];  Service: Endoscopy;  Laterality: N/A;   IMPLANTABLE CARDIOVERTER DEFIBRILLATOR IMPLANT N/A 10/21/2012   Procedure: IMPLANTABLE CARDIOVERTER DEFIBRILLATOR IMPLANT;  Surgeon: Evans Lance, MD;  Location: [facility] CATH LAB;  Service: Cardiovascular;  Laterality: N/A;   RIGHT/LEFT HEART CATH AND CORONARY ANGIOGRAPHY N/A 03/26/2018   Procedure: RIGHT/LEFT HEART CATH AND CORONARY ANGIOGRAPHY;  Surgeon: Nelva Bush, MD;  Location: [place] CV LAB;  Service: Cardiovascular;  Laterality: N/A;     Home Medications:  Prior to Admission medications   Medication Sig Start Date End Date Taking? Authorizing Provider  aspirin EC 81 MG tablet Take 81 mg by mouth daily.   Yes [provider]  carvedilol (COREG)  12.5 MG tablet Take 1 tablet (12.5 mg total) by mouth 2 (two) times daily with a meal. 08/13/21  Yes [name], MD  nitroGLYCERIN (NITROSTAT) 0.4 MG SL tablet Place 1 tablet (0.4 mg total) under the tongue every 5 (five) minutes as needed for chest pain (up to 3 doses). 11/03/17  Yes [name], MD  sacubitril-valsartan (ENTRESTO) 97-103 MG Take 1 tablet by mouth 2 (two) times daily. 08/13/21  Yes [name], MD  spironolactone (ALDACTONE) 25 MG tablet Take 0.5 tablets  (12.5 mg total) by mouth daily. 08/13/21  Yes [name], MD  albuterol (PROAIR HFA) 108 (90 Base) MCG/ACT inhaler Inhale 2 puffs into the lungs every 6 (six) hours as needed for wheezing or shortness of breath. Patient not taking: Reported on 01/29/2022 03/28/20   Brand Males, MD  atorvastatin (LIPITOR) 40 MG tablet Take 1 tablet (40 mg total) by mouth daily. 10/23/21 01/21/22  Wellington Hampshire, MD  feeding supplement, ENSURE COMPLETE, (ENSURE COMPLETE) LIQD Take 237 mLs by mouth daily. Patient not taking: Reported on 02/07/2022 05/19/14   Cristal Ford, DO  levothyroxine (SYNTHROID) 25 MCG tablet Take 25 mcg by mouth daily. 01/15/22   [provider]  Misc. Devices MISC Use scale daily to monitor weight. 01/10/22   [provider]    Inpatient Medications: Scheduled Meds:  aspirin EC  81 mg Oral Daily   atorvastatin  40 mg Oral Daily   carvedilol  3.125 mg Oral BID WC   enoxaparin (LOVENOX) injection  40 mg Subcutaneous I62V   folic acid  1 mg Oral Daily   [START ON 02/09/2022] levothyroxine  25 mcg Oral Q0600   mouth rinse  15 mL Mouth Rinse BID   multivitamin with minerals  1 tablet Oral Daily   sodium chloride flush  3 mL Intravenous Q12H   spironolactone  12.5 mg Oral Daily   thiamine  100 mg Oral Daily   Or   thiamine  100 mg Intravenous Daily   Continuous Infusions:  sodium chloride 10 mL/hr at 02/08/22 1505   magnesium sulfate bolus IVPB     PRN Meds: sodium chloride, acetaminophen **OR** acetaminophen, LORazepam **OR** LORazepam, polyethylene glycol  Allergies:    Allergies  Allergen Reactions   Clopidogrel Bisulfate Rash    Social History:   Social History   Socioeconomic History   Marital status: Single    Spouse name: Not on file   Number of children: Not on file   Years of education: Not on file   Highest education level: Not on file  Occupational History   Not on file  Tobacco Use   Smoking status: Former    Packs/day: 0.50     Years: 40.00    Pack years: 20.00    Types: Cigarettes    Quit date: 04/27/2018    Years since quitting: 3.7   Smokeless tobacco: Never   Tobacco comments:    pt is on nicotine patches   Vaping Use   Vaping Use: Never used  Substance and Sexual Activity   Alcohol use: Yes    Alcohol/week: 20.0 standard drinks    Types: 20 Cans of beer per week    Comment: 05/18/2014 "2, 40's qod"; 1 beer last week    Drug use: No   Sexual activity: Yes  Other Topics Concern   Not on file  Social History Narrative   Single - disabled - last job at [employer]   + alcohol  no smoking no substances   Social Determinants of Radio broadcast assistant Strain: Not on file  Food Insecurity: Not on file  Transportation Needs: Not on file  Physical Activity: Not on file  Stress: Not on file  Social Connections: Not on file  Intimate Partner Violence: Not on file    Family History:    Family History  Problem Relation Age of Onset   Cancer Mother        cervical   Heart attack Father    Asthma Brother    Other Brother        Pacemaker     ROS:  Please see the history of present illness.  Constitutional: Negative for chills, fever, night sweats, unintentional weight loss  HENT: Negative for ear pain and hearing loss.   Eyes: Negative for loss of vision and eye pain.  Respiratory: Negative for cough, sputum, wheezing.   Cardiovascular: See HPI. Gastrointestinal: Negative for abdominal pain, melena, and hematochezia.  Genitourinary: Negative for dysuria and hematuria.  Musculoskeletal: Negative for falls and myalgias.  Skin: Negative for itching and rash.  Neurological: Negative for focal weakness, focal sensory changes and loss of consciousness.  Endo/Heme/Allergies: Does not bruise/bleed easily.   All other ROS reviewed and negative.     Physical Exam/Data:   Vitals:   02/08/22 0821 02/08/22 1337 02/08/22 1400 02/08/22 1452  BP: 106/71 (!) 77/55 (!) 80/58 (!) 88/60  Pulse:  83 98    Resp: 19 20    Temp: 97.8 F (36.6 C) 98.1 F (36.7 C)    TempSrc: Oral Oral    SpO2: 100% 100%    Weight:      Height:        Intake/Output Summary (Last 24 hours) at 02/08/2022 1505 Last data filed at 02/08/2022 1231 Gross per 24 hour  Intake 480 ml  Output 1600 ml  Net -1120 ml      02/07/2022    9:11 PM 01/29/2022   11:56 AM 12/02/2021    6:34 PM  Last 3 Weights  Weight (lbs) 170 lb 1.6 oz 172 lb 6.4 oz 175 lb  Weight (kg) 77.157 kg 78.2 kg 79.379 kg     Body mass index is 25.12 kg/m.  General:  Well nourished, well developed, in no acute distress. [place] in place HEENT: normal Neck: JVD at clavicle at 45 degrees Vascular: No carotid bruits; Distal pulses 2+ bilaterally Cardiac:  normal S1, S2; RRR; no murmur  Lungs:  clear to auscultation bilaterally in upper fields, slightly diminished at bilateral bases Abd: soft, nontender, no hepatomegaly  Ext: no edema in lower extremities Musculoskeletal:  No deformities, BUE and BLE strength normal and equal Skin: warm and dry  Neuro:  CNs 2-12 intact, no focal abnormalities noted Psych:  Normal affect   EKG:  The EKG was personally reviewed and demonstrates:  sinus tach at 116 bpm with PVCs Telemetry:  Telemetry was personally reviewed and demonstrates:  sinus with intermittent PVCs, 5 beats NSVT this AM  Relevant CV Studies: Echo 02/08/22 1. Left ventricular ejection fraction, by estimation, is 40 to 45%. The  left ventricle has mildly decreased function. The left ventricle  demonstrates global hypokinesis. Left ventricular diastolic parameters are  consistent with Grade I diastolic  dysfunction (impaired relaxation).   2. Right ventricular systolic function is normal. The right ventricular  size is normal.   3. Left atrial size was mildly dilated.   4. The mitral valve is normal in structure. Mild  mitral valve  regurgitation.   5. The aortic valve is normal in structure. Aortic valve regurgitation is  mild.   6.  Aortic no significant ascending aortic aneurysm.   7. The inferior vena cava is dilated in size with >50% respiratory  variability, suggesting right atrial pressure of 8 mmHg.   Comparison(s): No significant change from prior study.   Laboratory Data:  High Sensitivity Troponin:   Recent Labs  Lab 02/07/22 1356  TROPONINIHS 10     Chemistry Recent Labs  Lab 02/07/22 1356 02/08/22 0429  NA 139 140  K 3.1* 3.6  CL 108 105  CO2 23 26  GLUCOSE 111* 96  BUN 6* 7*  CREATININE 0.70 0.79  CALCIUM 8.5* 8.9  MG 1.4*  --   GFRNONAA >60 >60  ANIONGAP 8 9    Recent Labs  Lab 02/07/22 1356 02/08/22 0429  PROT 7.0 6.8  ALBUMIN 3.3* 3.1*  AST 56* 51*  ALT 29 26  ALKPHOS 94 67  BILITOT 0.8 0.9   Lipids No results for input(s): CHOL, TRIG, HDL, LABVLDL, LDLCALC, CHOLHDL in the last 168 hours.  Hematology Recent Labs  Lab 02/07/22 1356 02/08/22 0429  WBC 5.8 8.7  RBC 3.49* 3.55*  HGB 11.7* 11.9*  HCT 36.0 36.1  MCV 103.2* 101.7*  MCH 33.5 33.5  MCHC 32.5 33.0  RDW 14.7 14.5  PLT 117* 113*   Thyroid No results for input(s): TSH, FREET4 in the last 168 hours.  BNP Recent Labs  Lab 02/07/22 1357  BNP 766.2*    DDimer No results for input(s): DDIMER in the last 168 hours.   Radiology/Studies:  CT Angio Chest PE W and/or Wo Contrast  Result Date: 02/07/2022 CLINICAL DATA:  Pulmonary embolism Shortness of breath EXAM: CT ANGIOGRAPHY CHEST WITH CONTRAST TECHNIQUE: Multidetector CT imaging of the chest was performed using the standard protocol during bolus administration of intravenous contrast. Multiplanar CT image reconstructions and MIPs were obtained to evaluate the vascular anatomy. RADIATION DOSE REDUCTION: This exam was performed according to the departmental dose-optimization program which includes automated exposure control, adjustment of the mA and/or kV according to patient size and/or use of iterative reconstruction technique. CONTRAST:  75 mL OMNIPAQUE IOHEXOL  350 MG/ML SOLN COMPARISON:  12/02/2021 FINDINGS: Cardiovascular: Single lead left chest wall AICD is seen on scout image. Heart size is within normal limits. Evaluation of the lung bases limited by motion. No pulmonary artery embolism is identified. Mediastinum/Nodes: No enlarged mediastinal, hilar, or axillary lymph nodes. Mild diffuse thickening of the wall the esophagus, particularly in the distal segment. This is best appreciated on image 113 of series 4. Lungs/Pleura: Moderate emphysematous changes the lungs. No focal airspace opacity to indicate pneumonia. Upper Abdomen: Evaluation of the upper abdomen is significantly limited by motion. No acute abnormality is seen. Musculoskeletal: Evaluation for fractures is limited by motion. No definitive acute abnormality of the visualized osseous structures. Review of the MIP images confirms the above findings. IMPRESSION: 1. No pulmonary artery embolism. 2. Evaluation for fractures is significantly limited due to motion. If there is high clinical suspicion for rib or spine fractures, repeat noncontrast chest CT should be performed. 3. Diffuse thickening of the distal esophageal wall most consistent with esophagitis. Correlation with endoscopy should be performed on nonemergent basis to evaluate for underlying mass. Electronically Signed   By: Miachel Roux M.D.   On: 02/07/2022 16:27   DG Chest Portable 1 View  Result Date: 02/07/2022 CLINICAL DATA:  Provided history: Shortness of  breath. Additional history provided: Shortness of breath, leg swelling. EXAM: PORTABLE CHEST 1 VIEW COMPARISON:  CT angiogram chest/abdomen/pelvis 12/02/2021. Prior chest radiographs 12/02/2021 and earlier. FINDINGS: Redemonstrated left chest single lead AICD. Heart size within normal limits. Aortic atherosclerosis. Prominence of the interstitial lung markings, greatest at the lung bases, suspicious for interstitial edema. No evidence of pleural effusion or pneumothorax. No acute bony  abnormality identified. IMPRESSION: Prominence of the interstitial lung markings, greatest at the lung bases, suspicious for interstitial edema. Aortic Atherosclerosis (ICD10-I70.0). Electronically Signed   By: Kellie Simmering D.O.   On: 02/07/2022 13:47   ECHOCARDIOGRAM COMPLETE  Result Date: 02/08/2022    ECHOCARDIOGRAM REPORT   Patient Name:   KORINNA TAT Date of Exam: 02/08/2022 Medical Rec #:  [id number]        Height:       69.0 in Accession #:    [id number]       Weight:       170.1 lb Date of Birth:  [birth year]         BSA:          1.929 m Patient Age:    72 years         BP:           108/85 mmHg Patient Gender: F                HR:           87 bpm. Exam Location:  Inpatient Procedure: 2D Echo, Cardiac Doppler and Color Doppler Indications:    Congestive heart failure  History:        Patient has prior history of Echocardiogram examinations, most                 recent 01/20/2019. CHF, CAD, Defibrillator, COPD; Risk                 Factors:Hypertension. Tobacco and ETOH abuse.  Sonographer:    Joette Catching RCS Referring Phys: [id number] [place]  1. Left ventricular ejection fraction, by estimation, is 40 to 45%. The left ventricle has mildly decreased function. The left ventricle demonstrates global hypokinesis. Left ventricular diastolic parameters are consistent with Grade I diastolic dysfunction (impaired relaxation).  2. Right ventricular systolic function is normal. The right ventricular size is normal.  3. Left atrial size was mildly dilated.  4. The mitral valve is normal in structure. Mild mitral valve regurgitation.  5. The aortic valve is normal in structure. Aortic valve regurgitation is mild.  6. Aortic no significant ascending aortic aneurysm.  7. The inferior vena cava is dilated in size with >50% respiratory variability, suggesting right atrial pressure of 8 mmHg. Comparison(s): No significant change from prior study. FINDINGS  Left Ventricle: Left ventricular  ejection fraction, by estimation, is 40 to 45%. The left ventricle has mildly decreased function. The left ventricle demonstrates global hypokinesis. The left ventricular internal cavity size was normal in size. There is  borderline left ventricular hypertrophy. Left ventricular diastolic parameters are consistent with Grade I diastolic dysfunction (impaired relaxation). Right Ventricle: The right ventricular size is normal. No increase in right ventricular wall thickness. Right ventricular systolic function is normal. Left Atrium: Left atrial size was mildly dilated. Right Atrium: Right atrial size was normal in size. Pericardium: There is no evidence of pericardial effusion. Mitral Valve: The mitral valve is normal in structure. Mild mitral valve regurgitation. Tricuspid Valve: The tricuspid valve is normal in structure. Tricuspid valve regurgitation  is mild. Aortic Valve: The aortic valve is normal in structure. Aortic valve regurgitation is mild. Aortic regurgitation PHT measures 403 msec. Aortic valve mean gradient measures 2.0 mmHg. Aortic valve peak gradient measures 4.0 mmHg. Aortic valve area, by VTI measures 2.03 cm. Pulmonic Valve: The pulmonic valve was grossly normal. Pulmonic valve regurgitation is trivial. Aorta: No significant ascending aortic aneurysm. Venous: The inferior vena cava is dilated in size with greater than 50% respiratory variability, suggesting right atrial pressure of 8 mmHg. IAS/Shunts: No atrial level shunt detected by color flow Doppler. Additional Comments: A device lead is visualized.  LEFT VENTRICLE PLAX 2D LVIDd:         5.40 cm      Diastology LVIDs:         5.00 cm      LV e' medial:    4.68 cm/s LV PW:         1.10 cm      LV E/e' medial:  16.2 LV IVS:        1.00 cm      LV e' lateral:   3.81 cm/s LVOT diam:     2.10 cm      LV E/e' lateral: 19.9 LV SV:         34 LV SV Index:   18 LVOT Area:     3.46 cm  LV Volumes (MOD) LV vol d, MOD A2C: 107.0 ml LV vol d, MOD A4C:  98.0 ml LV vol s, MOD A2C: 72.5 ml LV vol s, MOD A4C: 70.2 ml LV SV MOD A2C:     34.5 ml LV SV MOD A4C:     98.0 ml LV SV MOD BP:      31.4 ml RIGHT VENTRICLE             IVC RV Basal diam:  3.20 cm     IVC diam: 2.20 cm RV Mid diam:    1.90 cm RV S prime:     11.20 cm/s TAPSE (M-mode): 2.4 cm LEFT ATRIUM             Index        RIGHT ATRIUM           Index LA diam:        3.60 cm 1.87 cm/m   RA Area:     13.60 cm LA Vol (A2C):   49.4 ml 25.61 ml/m  RA Volume:   35.90 ml  18.61 ml/m LA Vol (A4C):   51.7 ml 26.81 ml/m LA Biplane Vol: 55.3 ml 28.67 ml/m  AORTIC VALVE                    PULMONIC VALVE AV Area (Vmax):    2.51 cm     PV Vmax:          0.80 m/s AV Area (Vmean):   2.53 cm     PV Peak grad:     2.5 mmHg AV Area (VTI):     2.03 cm     PR End Diast Vel: 8.29 msec AV Vmax:           100.00 cm/s AV Vmean:          74.600 cm/s AV VTI:            0.169 m AV Peak Grad:      4.0 mmHg AV Mean Grad:      2.0 mmHg LVOT Vmax:  72.50 cm/s LVOT Vmean:        54.500 cm/s LVOT VTI:          0.099 m LVOT/AV VTI ratio: 0.59 AI PHT:            403 msec  AORTA Ao Root diam: 3.10 cm Ao Asc diam:  3.50 cm MITRAL VALVE                  TRICUSPID VALVE MV Area (PHT): 12.64 cm      TR Peak grad:   33.2 mmHg MV Decel Time: 60 msec        TR Vmax:        288.00 cm/s MR Peak grad:    61.7 mmHg MR Mean grad:    48.0 mmHg    SHUNTS MR Vmax:         392.67 cm/s  Systemic VTI:  0.10 m MR Vmean:        333.0 cm/s   Systemic Diam: 2.10 cm MR PISA:         0.57 cm MR PISA Eff ROA: 6 mm MR PISA Radius:  0.30 cm MV E velocity: 75.80 cm/s MV A velocity: 90.55 cm/s MV E/A ratio:  0.84 MV A Prime:    12.0 cm/s Phineas Inches Electronically signed by Phineas Inches Signature Date/Time: 02/08/2022/10:08:35 AM    Final    VAS [place] LOWER EXTREMITY VENOUS (DVT) (ONLY [facility] & [facility])  Result Date: 02/07/2022  Lower Venous DVT Study Patient Name:  VEIDA SPIRA  Date of Exam:   02/07/2022 Medical Rec #: [id number]         Accession #:     [id number] Date of Birth: 11-Jan-1956          Patient Gender: F Patient Age:   31 years Exam Location:  [facility] Procedure:      VAS [place] LOWER EXTREMITY VENOUS (DVT) Referring Phys: Rayna Sexton --------------------------------------------------------------------------------  Indications: SOB, swelling.  Comparison Study: No prior venous studies. Extensive arterial history. Performing Technologist: Darlin Coco RDMS, RVT  Examination Guidelines: A complete evaluation includes B-mode imaging, spectral Doppler, color Doppler, and power Doppler as needed of all accessible portions of each vessel. Bilateral testing is considered an integral part of a complete examination. Limited examinations for reoccurring indications may be performed as noted. The reflux portion of the exam is performed with the patient in reverse Trendelenburg.  +---------+---------------+---------+-----------+----------+--------------+ RIGHT    CompressibilityPhasicitySpontaneityPropertiesThrombus Aging +---------+---------------+---------+-----------+----------+--------------+ CFV      Full           Yes      Yes                                 +---------+---------------+---------+-----------+----------+--------------+ SFJ      Full                                                        +---------+---------------+---------+-----------+----------+--------------+ FV Prox  Full                                                        +---------+---------------+---------+-----------+----------+--------------+  FV Mid   Full                                                        +---------+---------------+---------+-----------+----------+--------------+ FV DistalFull                                                        +---------+---------------+---------+-----------+----------+--------------+ PFV      Full                                                         +---------+---------------+---------+-----------+----------+--------------+ POP      Full           Yes      Yes                                 +---------+---------------+---------+-----------+----------+--------------+ PTV      Full                                                        +---------+---------------+---------+-----------+----------+--------------+ PERO     Full                                                        +---------+---------------+---------+-----------+----------+--------------+ Gastroc  Full                                                        +---------+---------------+---------+-----------+----------+--------------+   +---------+---------------+---------+-----------+----------+--------------+ LEFT     CompressibilityPhasicitySpontaneityPropertiesThrombus Aging +---------+---------------+---------+-----------+----------+--------------+ CFV      Full           Yes      Yes                                 +---------+---------------+---------+-----------+----------+--------------+ SFJ      Full                                                        +---------+---------------+---------+-----------+----------+--------------+ FV Prox  Full                                                        +---------+---------------+---------+-----------+----------+--------------+  FV Mid   Full                                                        +---------+---------------+---------+-----------+----------+--------------+ FV DistalFull                                                        +---------+---------------+---------+-----------+----------+--------------+ PFV      Full                                                        +---------+---------------+---------+-----------+----------+--------------+ POP      Full           Yes      Yes                                  +---------+---------------+---------+-----------+----------+--------------+ PTV      Full                                                        +---------+---------------+---------+-----------+----------+--------------+ PERO     Full                                                        +---------+---------------+---------+-----------+----------+--------------+ Gastroc  Full                                                        +---------+---------------+---------+-----------+----------+--------------+     Summary: RIGHT: - There is no evidence of deep vein thrombosis in the lower extremity.  - No cystic structure found in the popliteal fossa.  LEFT: - There is no evidence of deep vein thrombosis in the lower extremity.  - No cystic structure found in the popliteal fossa.  *See table(s) above for measurements and observations. Electronically signed by Orlie Pollen on 02/07/2022 at 5:24:38 PM.    Final      Assessment and Plan:   Acute on chronic systolic and diastolic heart failure -symptoms are shortness of breath and LE edema -echo unchanged from prior, EF 40-45%, RAP 8, E/e' slightly above 15. No significant valve disease -BNP 766, CXR with interstitial edema -home meds: carvedilol 12.5 mg BID, spironolactone 12.5 mg daily, entresto 97-103 BID. These have been continued up until today -has been hypotensive over last 24 hours. Agree with holding entresto and decreasing carvedilol. Can restart/increase once blood pressure allows. My concern is that she may have been running asymptomatically low at  home as well. Would discharge with scale and BP cuff if possible -appears nearly euvolemic on exam. Legs without edema, JVD only slightly up. Would diurese PRN, aim to wean O2 -continue spironolactone -would start SGLT2i this admission once BP allows -admission weight 77.2 kg. Weight in office 10/23/21 with Dr. [name] Anon 78.9 kg  CAD PAD -continue aspirin, statin -hsTn  normal  History of VT S/P ICD -keep K>4, Mg>2 -monitor on telemetry  Risk Assessment/Risk Scores:        New York Heart Association (NYHA) Functional Class NYHA Class IV        For questions or updates, please contact [facility] Please consult [email or web address] for contact info under    Signed, Buford Dresser, MD  02/08/2022 3:05 PM

## 2022-02-08 NOTE — Progress Notes (Signed)
Returned patient call as requested by voice mail.  Pt wanted to report she is currently in the hospital.  She reports breathing became worse when she shopping and she went to the ER.  She hopes to be discharged home and she is receiving IV diuretic.  She is feeling better and will send a manual remote transmission on Monday or Tuesday if she is discharged.

## 2022-02-08 NOTE — Progress Notes (Addendum)
   02/08/22 1337  Assess: MEWS Score  Temp 98.1 F (36.7 C)  BP (!) 77/55 (RN notified)  MAP (mmHg) (!) 64  Pulse Rate 98  ECG Heart Rate 81  Resp 20  SpO2 100 %  O2 Device Nasal Cannula  O2 Flow Rate (L/min) 2 L/min  Assess: MEWS Score  MEWS Temp 0  MEWS Systolic 2  MEWS Pulse 0  MEWS RR 0  MEWS LOC 0  MEWS Score 2  MEWS Score Color Yellow  Assess: if the MEWS score is Yellow or Red  Were vital signs taken at a resting state? Yes  Focused Assessment Change from prior assessment (see assessment flowsheet)  Does the patient meet 2 or more of the SIRS criteria? No  MEWS guidelines implemented *See Row Information* Yes  Escalate  MEWS: Escalate Yellow: discuss with charge nurse/RN and consider discussing with provider and RRT  Notify: Charge Nurse/RN  Date Charge Nurse/RN Notified 02/08/22  Time Charge Nurse/RN Notified 1405  Notify: Provider  Provider Name/Title Dr. Algis Liming  Date Provider Notified 02/08/22  Time Provider Notified 1345  Method of Notification  (Secure chat)  Notification Reason Change in status (BP 77/55)  Provider response Other (Comment) (Verbal order to elevate legs and verbal order to re-check BP manually in 30 minutes and in 1 hour)  Date of Provider Response 02/08/22  Time of Provider Response 1345  Assess: SIRS CRITERIA  SIRS Temperature  0  SIRS Pulse 0  SIRS Respirations  0  SIRS WBC 0  SIRS Score Sum  0   Manually re-checked BP at 14:00 and resulted in 80/58 of the right upper arm. Patient is currently asymptomatic. MD called and stated to keep patient in bed and elevate legs with pillows. Will re-check BP in 30 minutes and in 1 hour.  Layla Maw, RN

## 2022-02-09 LAB — COMPREHENSIVE METABOLIC PANEL
ALT: 21 U/L (ref 0–44)
AST: 36 U/L (ref 15–41)
Albumin: 2.9 g/dL — ABNORMAL LOW (ref 3.5–5.0)
Alkaline Phosphatase: 62 U/L (ref 38–126)
Anion gap: 6 (ref 5–15)
BUN: 8 mg/dL (ref 8–23)
CO2: 28 mmol/L (ref 22–32)
Calcium: 8.8 mg/dL — ABNORMAL LOW (ref 8.9–10.3)
Chloride: 105 mmol/L (ref 98–111)
Creatinine, Ser: 0.79 mg/dL (ref 0.44–1.00)
GFR, Estimated: >60 mL/min (ref >60)
Glucose, Bld: 101 mg/dL — ABNORMAL HIGH (ref 70–99)
Potassium: 3.5 mmol/L (ref 3.5–5.1)
Sodium: 139 mmol/L (ref 135–145)
Total Bilirubin: 0.7 mg/dL (ref 0.3–1.2)
Total Protein: 6.4 g/dL — ABNORMAL LOW (ref 6.5–8.1)

## 2022-02-09 LAB — CBC
HCT: 34.6 % — ABNORMAL LOW (ref 36.0–46.0)
Hemoglobin: 11.2 g/dL — ABNORMAL LOW (ref 12.0–15.0)
MCH: 33.2 pg (ref 26.0–34.0)
MCHC: 32.4 g/dL (ref 30.0–36.0)
MCV: 102.7 fL — ABNORMAL HIGH (ref 80.0–100.0)
Platelets: 114 10*3/uL — ABNORMAL LOW (ref 150–400)
RBC: 3.37 MIL/uL — ABNORMAL LOW (ref 3.87–5.11)
RDW: 14.2 % (ref 11.5–15.5)
WBC: 7.7 10*3/uL (ref 4.0–10.5)
nRBC: 0.3 % — ABNORMAL HIGH (ref 0.0–0.2)

## 2022-02-09 LAB — MAGNESIUM: Magnesium: 2 mg/dL (ref 1.7–2.4)

## 2022-02-09 MED ORDER — THIAMINE HCL 100 MG PO TABS: 100.0000 mg | ORAL_TABLET | Freq: Every day | ORAL | 0 refills | Status: AC

## 2022-02-09 MED ORDER — FOLIC ACID 1 MG PO TABS: 1.0000 mg | ORAL_TABLET | Freq: Every day | ORAL | 0 refills | Status: AC

## 2022-02-09 MED ORDER — FUROSEMIDE 40 MG PO TABS: 40.0000 mg | ORAL_TABLET | Freq: Every day | ORAL | 1 refills | Status: DC | PRN

## 2022-02-09 MED ORDER — POTASSIUM CHLORIDE CRYS ER 20 MEQ PO TBCR
40.0000 meq | EXTENDED_RELEASE_TABLET | Freq: Once | ORAL | Status: AC
Start: 2022-02-09 — End: 2022-02-09
  Administered 2022-02-09: 40 meq via ORAL
  Filled 2022-02-09: qty 2

## 2022-02-09 MED ORDER — CARVEDILOL 3.125 MG PO TABS: 3.1250 mg | ORAL_TABLET | Freq: Two times a day (BID) | ORAL | 1 refills | Status: DC

## 2022-02-09 MED ORDER — ADULT MULTIVITAMIN W/MINERALS CH: 1.0000 | ORAL_TABLET | Freq: Every day | ORAL | Status: AC

## 2022-02-09 NOTE — Progress Notes (Signed)
SATURATION QUALIFICATIONS: (This note is used to comply with regulatory documentation for home oxygen)  Patient Saturations on Room Air at Rest = 98%  Patient Saturations on Room Air while Ambulating = 94%  Patient does not qualify for oxygen.

## 2022-02-09 NOTE — Progress Notes (Signed)
Discharge teaching complete. Meds, diet, activity, follow up appointments and CHF education reviewed and all questions answered. Copy of instructions given to patient and prescriptions sent to pharmacy.

## 2022-02-09 NOTE — Plan of Care (Signed)
  Problem: Education: Goal: Ability to verbalize understanding of medication therapies will improve Outcome: Progressing   Problem: Activity: Goal: Capacity to carry out activities will improve Outcome: Progressing   Problem: Education: Goal: Ability to verbalize understanding of medication therapies will improve Outcome: Progressing   Problem: Activity: Goal: Capacity to carry out activities will improve Outcome: Progressing

## 2022-02-09 NOTE — Discharge Summary (Signed)
Physician Discharge Summary  [name] TKW:[id number] DOB: 25-Dec-1955  PCP: Myrtie Neither, PA-C  Admitted from: Home  Discharged to: Home  Admit date: 02/07/2022 Discharge date: 02/09/2022  Recommendations for Outpatient Follow-up:    Follow-up Information     [name], MD Follow up on 02/21/2022.   Specialty: Cardiology Why: Keep scheduled Cardiology follow-up for 02/21/2022 at 10:00 AM. Contact information: [address] [place] [address] [phone]         Myrtie Neither, PA-C. Schedule an appointment as soon as possible for a visit in 1 week(s).   Specialty: Physician Assistant Why: To be seen with repeat labs (CBC, BMP and magnesium) Contact information: [address] [place] [address] [phone]         Evans Lance, MD .   Specialty: Cardiology Contact information: [address] [address] [place]: None    Equipment/Devices: None    Discharge Condition: Improved and stable.   Code Status: Full Code Diet recommendation:  Discharge Diet Orders (From admission, onward)     Start     Ordered   02/09/22 0000  Diet - low sodium heart healthy        02/09/22 1513             Discharge Diagnoses:  Principal Problem:   CHF exacerbation ([place]) Active Problems:   Elevated lipids   HYPERTENSION, BENIGN   CAD, NATIVE VESSEL   SYSTOLIC HEART FAILURE, CHRONIC   PVD (peripheral vascular disease) (HCC)   Automatic implantable cardioverter-defibrillator in situ   TIA (transient ischemic attack)   COPD with acute exacerbation (HCC)   Alcohol use ([place])   Acute respiratory failure with hypoxia ([place])   Hypokalemia   Brief Summary: 66 year old female, lives with her friend, independent, complex medical history significant for CAD, chronic systolic CHF, s/p ICD placement, COPD not on home oxygen, former tobacco use,  alcohol dependence, hyperlipidemia, previous ventricular tachycardia, PAD, HLD, HTN, TIA, elevated ferritin being evaluated by hematology and hypothyroidism, presented to the [facility] ED on 02/07/2022 with several days of progressively worsening dyspnea on exertion, and bilateral leg edema.  She reportedly contacted her cardiologist office, ICD was checked and was told to have "fluid in her lungs", was advised to continue Aldactone with no medication changes.  Admitted for acute respiratory failure with hypoxia secondary to acute on chronic systolic CHF.  Treated briefly with IV Lasix.  Course complicated by hypotension, IV Lasix discontinued, titrated down cardiac meds.  Clinically improved.  Hypoxia resolved.  Cardiology assisted with care.     Assessment & Plan:   Acute on chronic systolic CHF, s/p ICD: TTE 6/2: LVEF 40-45% with global hypokinesis, grade 1 diastolic dysfunction, no change from prior study. BNP 766.2.  High-sensitivity troponin negative. CTA chest without PE Bilateral lower extremity venous Dopplers: No DVT Treated with IV Lasix 20 mg every 12 hours x3 doses, -1 L thus far, weight 76 kg Developed hypotension with BP as low as 77/55 on 6/2 afternoon.  IV Lasix discontinued.  Entresto held.  Carvedilol reduced from 12.5 Mg twice daily to 3.125 Mg twice daily with holding parameters. Cardiology consulted and follow-up appreciated. Clinically euvolemic.  Hypoxia resolved. As per cardiology sign off today, suspect that she might have had flash pulmonary edema.  Since blood pressures are  still soft, recommend holding Entresto at discharge until outpatient follow-up, continuing low-dose carvedilol at 3.125 Mg twice daily, continuing spironolactone 12.5 Mg daily and adding Lasix daily as needed for volume overload features. Patient has follow-up appointment with primary cardiologist on 6/15.  Needs repeat labs either with PCP or with cardiologist.   Acute respiratory failure  with hypoxia Secondary to decompensated CHF Presented with tachypnea in the 20s, tachycardia in the 120s and initial oxygen saturation of 87% on room air Placed on 2 L/min [place] oxygen. On day of discharge, weaned off oxygen and oxygen saturations in the mid 90s on room air both at rest and with activity.   Hypokalemia: Replaced.  Follow BMP as outpatient.  Hypomagnesemia: Magnesium 1.4.  Replaced.  Follow as outpatient.   Essential hypertension: Meds as above. Ongoing soft SBPs in the 98-107 range since this morning.  Med changes as noted above.  Hyperlipidemia Continue statins   Macrocytic anemia ?  Related to alcohol use disorder Serum iron 173, ferritin 1422, B12: 268, folate 6.8 Stable.  Thrombocytopenia Suspected due to alcohol use disorder. Stable.  Elevated ferritin: Being evaluated by outpatient hematology, Dr. Julien Nordmann.   COPD/former tobacco use: No clinical bronchospasm.  Not on home oxygen PTA. Quit smoking 4 years ago  Alcohol use disorder CIWA protocol initiated.  CIWA scores 0.  No withdrawal signs and symptoms noted.   PAD/CAD No angina or claudication symptoms Continue aspirin and atorvastatin.  Continue reduced dose beta-blockers.  History of TIA Continue aspirin and atorvastatin   Suspected esophagitis per CTA chest PPI. Outpatient follow-up regarding need for endoscopy evaluation.   Body mass index is 25.12 kg/m.      Consultants:   Cardiology   Procedures:       Discharge Instructions  Discharge Instructions     (HEART FAILURE PATIENTS) Call MD:  Anytime you have any of the following symptoms: 1) 3 pound weight gain in 24 hours or 5 pounds in 1 week 2) shortness of breath, with or without a dry hacking cough 3) swelling in the hands, feet or stomach 4) if you have to sleep on extra pillows at night in order to breathe.   Complete by: As directed    Call MD for:  difficulty breathing, headache or visual disturbances   Complete by: As  directed    Call MD for:  extreme fatigue   Complete by: As directed    Call MD for:  persistant dizziness or light-headedness   Complete by: As directed    Diet - low sodium heart healthy   Complete by: As directed    Increase activity slowly   Complete by: As directed         Medication List     STOP taking these medications    Entresto 97-103 MG Generic drug: sacubitril-valsartan   feeding supplement (ENSURE COMPLETE) Liqd       TAKE these medications    albuterol 108 (90 Base) MCG/ACT inhaler Commonly known as: ProAir HFA Inhale 2 puffs into the lungs every 6 (six) hours as needed for wheezing or shortness of breath.   aspirin EC 81 MG tablet Take 81 mg by mouth daily.   atorvastatin 40 MG tablet Commonly known as: LIPITOR Take 1 tablet (40 mg total) by mouth daily.   carvedilol 3.125 MG tablet Commonly known as: COREG Take 1 tablet (3.125 mg total) by mouth 2 (two) times daily with a meal. What changed:  medication strength how much to take  folic acid 1 MG tablet Commonly known as: FOLVITE Take 1 tablet (1 mg total) by mouth daily. Start taking on: February 10, 2022   furosemide 40 MG tablet Commonly known as: Lasix Take 1 tablet (40 mg total) by mouth daily as needed for fluid or edema (Anytime you have any of the following symptoms: 1) 3 pound weight gain in 24 hours or 5 pounds in 1 week 2) shortness of breath, with or without a dry hacking cough 3) swelling in the hands, feet or stomach 4) if you have to sleep on extra pillows at night in order to breathe.).   levothyroxine 25 MCG tablet Commonly known as: SYNTHROID Take 25 mcg by mouth daily.   Misc. Devices Misc Use scale daily to monitor weight.   multivitamin with minerals Tabs tablet Take 1 tablet by mouth daily. Start taking on: February 10, 2022   nitroGLYCERIN 0.4 MG SL tablet Commonly known as: Nitrostat Place 1 tablet (0.4 mg total) under the tongue every 5 (five) minutes as needed for  chest pain (up to 3 doses).   spironolactone 25 MG tablet Commonly known as: ALDACTONE Take 0.5 tablets (12.5 mg total) by mouth daily.   thiamine 100 MG tablet Take 1 tablet (100 mg total) by mouth daily. Start taking on: February 10, 2022       Allergies  Allergen Reactions   Clopidogrel Bisulfate Rash      Procedures/Studies: CT Angio Chest PE W and/or Wo Contrast  Result Date: 02/07/2022 CLINICAL DATA:  Pulmonary embolism Shortness of breath EXAM: CT ANGIOGRAPHY CHEST WITH CONTRAST TECHNIQUE: Multidetector CT imaging of the chest was performed using the standard protocol during bolus administration of intravenous contrast. Multiplanar CT image reconstructions and MIPs were obtained to evaluate the vascular anatomy. RADIATION DOSE REDUCTION: This exam was performed according to the departmental dose-optimization program which includes automated exposure control, adjustment of the mA and/or kV according to patient size and/or use of iterative reconstruction technique. CONTRAST:  75 mL OMNIPAQUE IOHEXOL 350 MG/ML SOLN COMPARISON:  12/02/2021 FINDINGS: Cardiovascular: Single lead left chest wall AICD is seen on scout image. Heart size is within normal limits. Evaluation of the lung bases limited by motion. No pulmonary artery embolism is identified. Mediastinum/Nodes: No enlarged mediastinal, hilar, or axillary lymph nodes. Mild diffuse thickening of the wall the esophagus, particularly in the distal segment. This is best appreciated on image 113 of series 4. Lungs/Pleura: Moderate emphysematous changes the lungs. No focal airspace opacity to indicate pneumonia. Upper Abdomen: Evaluation of the upper abdomen is significantly limited by motion. No acute abnormality is seen. Musculoskeletal: Evaluation for fractures is limited by motion. No definitive acute abnormality of the visualized osseous structures. Review of the MIP images confirms the above findings. IMPRESSION: 1. No pulmonary artery  embolism. 2. Evaluation for fractures is significantly limited due to motion. If there is high clinical suspicion for rib or spine fractures, repeat noncontrast chest CT should be performed. 3. Diffuse thickening of the distal esophageal wall most consistent with esophagitis. Correlation with endoscopy should be performed on nonemergent basis to evaluate for underlying mass. Electronically Signed   By: Miachel Roux M.D.   On: 02/07/2022 16:27   DG Chest Portable 1 View  Result Date: 02/07/2022 CLINICAL DATA:  Provided history: Shortness of breath. Additional history provided: Shortness of breath, leg swelling. EXAM: PORTABLE CHEST 1 VIEW COMPARISON:  CT angiogram chest/abdomen/pelvis 12/02/2021. Prior chest radiographs 12/02/2021 and earlier. FINDINGS: Redemonstrated left chest single lead AICD. Heart size within  normal limits. Aortic atherosclerosis. Prominence of the interstitial lung markings, greatest at the lung bases, suspicious for interstitial edema. No evidence of pleural effusion or pneumothorax. No acute bony abnormality identified. IMPRESSION: Prominence of the interstitial lung markings, greatest at the lung bases, suspicious for interstitial edema. Aortic Atherosclerosis (ICD10-I70.0). Electronically Signed   By: Kellie Simmering D.O.   On: 02/07/2022 13:47   ECHOCARDIOGRAM COMPLETE  Result Date: 02/08/2022    ECHOCARDIOGRAM REPORT   Patient Name:   [name] Date of Exam: 02/08/2022 Medical Rec #:  [id number]        Height:       69.0 in Accession #:    [id number]       Weight:       170.1 lb Date of Birth:  19-Aug-1956         BSA:          1.929 m Patient Age:    87 years         BP:           108/85 mmHg Patient Gender: F                HR:           87 bpm. Exam Location:  Inpatient Procedure: 2D Echo, Cardiac Doppler and Color Doppler Indications:    Congestive heart failure  History:        Patient has prior history of Echocardiogram examinations, most                 recent 01/20/2019. CHF,  CAD, Defibrillator, COPD; Risk                 Factors:Hypertension. Tobacco and ETOH abuse.  Sonographer:    Joette Catching RCS Referring Phys: [id number] [place]  1. Left ventricular ejection fraction, by estimation, is 40 to 45%. The left ventricle has mildly decreased function. The left ventricle demonstrates global hypokinesis. Left ventricular diastolic parameters are consistent with Grade I diastolic dysfunction (impaired relaxation).  2. Right ventricular systolic function is normal. The right ventricular size is normal.  3. Left atrial size was mildly dilated.  4. The mitral valve is normal in structure. Mild mitral valve regurgitation.  5. The aortic valve is normal in structure. Aortic valve regurgitation is mild.  6. Aortic no significant ascending aortic aneurysm.  7. The inferior vena cava is dilated in size with >50% respiratory variability, suggesting right atrial pressure of 8 mmHg. Comparison(s): No significant change from prior study. FINDINGS  Left Ventricle: Left ventricular ejection fraction, by estimation, is 40 to 45%. The left ventricle has mildly decreased function. The left ventricle demonstrates global hypokinesis. The left ventricular internal cavity size was normal in size. There is  borderline left ventricular hypertrophy. Left ventricular diastolic parameters are consistent with Grade I diastolic dysfunction (impaired relaxation). Right Ventricle: The right ventricular size is normal. No increase in right ventricular wall thickness. Right ventricular systolic function is normal. Left Atrium: Left atrial size was mildly dilated. Right Atrium: Right atrial size was normal in size. Pericardium: There is no evidence of pericardial effusion. Mitral Valve: The mitral valve is normal in structure. Mild mitral valve regurgitation. Tricuspid Valve: The tricuspid valve is normal in structure. Tricuspid valve regurgitation is mild. Aortic Valve: The aortic valve is normal in  structure. Aortic valve regurgitation is mild. Aortic regurgitation PHT measures 403 msec. Aortic valve mean gradient measures 2.0 mmHg. Aortic valve peak gradient measures 4.0 mmHg.  Aortic valve area, by VTI measures 2.03 cm. Pulmonic Valve: The pulmonic valve was grossly normal. Pulmonic valve regurgitation is trivial. Aorta: No significant ascending aortic aneurysm. Venous: The inferior vena cava is dilated in size with greater than 50% respiratory variability, suggesting right atrial pressure of 8 mmHg. IAS/Shunts: No atrial level shunt detected by color flow Doppler. Additional Comments: A device lead is visualized.  LEFT VENTRICLE PLAX 2D LVIDd:         5.40 cm      Diastology LVIDs:         5.00 cm      LV e' medial:    4.68 cm/s LV PW:         1.10 cm      LV E/e' medial:  16.2 LV IVS:        1.00 cm      LV e' lateral:   3.81 cm/s LVOT diam:     2.10 cm      LV E/e' lateral: 19.9 LV SV:         34 LV SV Index:   18 LVOT Area:     3.46 cm  LV Volumes (MOD) LV vol d, MOD A2C: 107.0 ml LV vol d, MOD A4C: 98.0 ml LV vol s, MOD A2C: 72.5 ml LV vol s, MOD A4C: 70.2 ml LV SV MOD A2C:     34.5 ml LV SV MOD A4C:     98.0 ml LV SV MOD BP:      31.4 ml RIGHT VENTRICLE             IVC RV Basal diam:  3.20 cm     IVC diam: 2.20 cm RV Mid diam:    1.90 cm RV S prime:     11.20 cm/s TAPSE (M-mode): 2.4 cm LEFT ATRIUM             Index        RIGHT ATRIUM           Index LA diam:        3.60 cm 1.87 cm/m   RA Area:     13.60 cm LA Vol (A2C):   49.4 ml 25.61 ml/m  RA Volume:   35.90 ml  18.61 ml/m LA Vol (A4C):   51.7 ml 26.81 ml/m LA Biplane Vol: 55.3 ml 28.67 ml/m  AORTIC VALVE                    PULMONIC VALVE AV Area (Vmax):    2.51 cm     PV Vmax:          0.80 m/s AV Area (Vmean):   2.53 cm     PV Peak grad:     2.5 mmHg AV Area (VTI):     2.03 cm     PR End Diast Vel: 8.29 msec AV Vmax:           100.00 cm/s AV Vmean:          74.600 cm/s AV VTI:            0.169 m AV Peak Grad:      4.0 mmHg AV Mean Grad:       2.0 mmHg LVOT Vmax:         72.50 cm/s LVOT Vmean:        54.500 cm/s LVOT VTI:          0.099 m LVOT/AV VTI ratio: 0.59 AI PHT:  403 msec  AORTA Ao Root diam: 3.10 cm Ao Asc diam:  3.50 cm MITRAL VALVE                  TRICUSPID VALVE MV Area (PHT): 12.64 cm      TR Peak grad:   33.2 mmHg MV Decel Time: 60 msec        TR Vmax:        288.00 cm/s MR Peak grad:    61.7 mmHg MR Mean grad:    48.0 mmHg    SHUNTS MR Vmax:         392.67 cm/s  Systemic VTI:  0.10 m MR Vmean:        333.0 cm/s   Systemic Diam: 2.10 cm MR PISA:         0.57 cm MR PISA Eff ROA: 6 mm MR PISA Radius:  0.30 cm MV E velocity: 75.80 cm/s MV A velocity: 90.55 cm/s MV E/A ratio:  0.84 MV A Prime:    12.0 cm/s Phineas Inches Electronically signed by Phineas Inches Signature Date/Time: 02/08/2022/10:08:35 AM    Final    VAS [place] LOWER EXTREMITY VENOUS (DVT) (ONLY [facility] & [facility])  Result Date: 02/07/2022  Lower Venous DVT Study Patient Name:  [name]  Date of Exam:   02/07/2022 Medical Rec #: [id number]         Accession #:    [id number] Date of Birth: [birth year]          Patient Gender: F Patient Age:   63 years Exam Location:  [facility] Procedure:      VAS [place] LOWER EXTREMITY VENOUS (DVT) Referring Phys: Rayna Sexton --------------------------------------------------------------------------------  Indications: SOB, swelling.  Comparison Study: No prior venous studies. Extensive arterial history. Performing Technologist: Darlin Coco RDMS, RVT  Examination Guidelines: A complete evaluation includes B-mode imaging, spectral Doppler, color Doppler, and power Doppler as needed of all accessible portions of each vessel. Bilateral testing is considered an integral part of a complete examination. Limited examinations for reoccurring indications may be performed as noted. The reflux portion of the exam is performed with the patient in reverse Trendelenburg.  +---------+---------------+---------+-----------+----------+--------------+  RIGHT    CompressibilityPhasicitySpontaneityPropertiesThrombus Aging +---------+---------------+---------+-----------+----------+--------------+ CFV      Full           Yes      Yes                                 +---------+---------------+---------+-----------+----------+--------------+ SFJ      Full                                                        +---------+---------------+---------+-----------+----------+--------------+ FV Prox  Full                                                        +---------+---------------+---------+-----------+----------+--------------+ FV Mid   Full                                                        +---------+---------------+---------+-----------+----------+--------------+  FV DistalFull                                                        +---------+---------------+---------+-----------+----------+--------------+ PFV      Full                                                        +---------+---------------+---------+-----------+----------+--------------+ POP      Full           Yes      Yes                                 +---------+---------------+---------+-----------+----------+--------------+ PTV      Full                                                        +---------+---------------+---------+-----------+----------+--------------+ PERO     Full                                                        +---------+---------------+---------+-----------+----------+--------------+ Gastroc  Full                                                        +---------+---------------+---------+-----------+----------+--------------+   +---------+---------------+---------+-----------+----------+--------------+ LEFT     CompressibilityPhasicitySpontaneityPropertiesThrombus Aging +---------+---------------+---------+-----------+----------+--------------+ CFV      Full           Yes      Yes                                  +---------+---------------+---------+-----------+----------+--------------+ SFJ      Full                                                        +---------+---------------+---------+-----------+----------+--------------+ FV Prox  Full                                                        +---------+---------------+---------+-----------+----------+--------------+ FV Mid   Full                                                        +---------+---------------+---------+-----------+----------+--------------+  FV DistalFull                                                        +---------+---------------+---------+-----------+----------+--------------+ PFV      Full                                                        +---------+---------------+---------+-----------+----------+--------------+ POP      Full           Yes      Yes                                 +---------+---------------+---------+-----------+----------+--------------+ PTV      Full                                                        +---------+---------------+---------+-----------+----------+--------------+ PERO     Full                                                        +---------+---------------+---------+-----------+----------+--------------+ Gastroc  Full                                                        +---------+---------------+---------+-----------+----------+--------------+     Summary: RIGHT: - There is no evidence of deep vein thrombosis in the lower extremity.  - No cystic structure found in the popliteal fossa.  LEFT: - There is no evidence of deep vein thrombosis in the lower extremity.  - No cystic structure found in the popliteal fossa.  *See table(s) above for measurements and observations. Electronically signed by Orlie Pollen on 02/07/2022 at 5:24:38 PM.    Final       Subjective: Denies complaints.  Able to sleep well flat last  night.  Dyspnea resolved.  Leg edema resolved.  No chest pain.  No other complaints reported.  Discharge Exam:  Vitals:   02/09/22 0205 02/09/22 0638 02/09/22 1116 02/09/22 1423  BP: 105/90 107/74  98/66  Pulse: 88 72  81  Resp: '20 18  20  '$ Temp: 97.7 F (36.5 C) 97.7 F (36.5 C)    TempSrc: Oral Oral    SpO2: 100% 100% 98% 98%  Weight:  76 kg    Height:        General exam: Middle-age female, moderately built and nourished lying comfortably supine in bed without distress. Respiratory system: Very occasional basal crackles but otherwise clear to auscultation.  No increased work of breathing. Cardiovascular system: S1 & S2 heard, RRR. No JVD, murmurs, rubs, gallops or clicks. No pedal edema.  Telemetry personally reviewed: Sinus rhythm.  Gastrointestinal system: Abdomen is nondistended, soft and nontender. No organomegaly or masses felt. Normal bowel sounds heard. Central nervous system: Alert and oriented. No focal neurological deficits. Extremities: Symmetric 5 x 5 power. Skin: No rashes, lesions or ulcers Psychiatry: Judgement and insight appear normal. Mood & affect appropriate.     The results of significant diagnostics from this hospitalization (including imaging, microbiology, ancillary and laboratory) are listed below for reference.     Microbiology: No results found for this or any previous visit (from the past 240 hour(s)).   Labs: CBC: Recent Labs  Lab 02/07/22 1356 02/08/22 0429 02/09/22 0519  WBC 5.8 8.7 7.7  NEUTROABS 2.3  --   --   HGB 11.7* 11.9* 11.2*  HCT 36.0 36.1 34.6*  MCV 103.2* 101.7* 102.7*  PLT 117* 113* 114*    Basic Metabolic Panel: Recent Labs  Lab 02/07/22 1356 02/08/22 0429 02/09/22 0519  NA 139 140 139  K 3.1* 3.6 3.5  CL 108 105 105  CO2 '23 26 28  '$ GLUCOSE 111* 96 101*  BUN 6* 7* 8  CREATININE 0.70 0.79 0.79  CALCIUM 8.5* 8.9 8.8*  MG 1.4*  --  2.0    Liver Function Tests: Recent Labs  Lab 02/07/22 1356 02/08/22 0429  02/09/22 0519  AST 56* 51* 36  ALT '29 26 21  '$ ALKPHOS 94 67 62  BILITOT 0.8 0.9 0.7  PROT 7.0 6.8 6.4*  ALBUMIN 3.3* 3.1* 2.9*    Time coordinating discharge: 25 minutes  SIGNED:  Vernell Leep, MD,  FACP, [facility], [facility], [facility] (Care Management Physician Certified). [facility]  To contact the attending provider between 7A-7P or the covering provider during after hours 7P-7A, please log into the web site [email or web address] and access using universal [place] password for that web site. If you do not have the password, please call the hospital operator.

## 2022-02-09 NOTE — TOC CAGE-AID Note (Deleted)
Transition of Care [facility]) - CAGE-AID Screening   Patient Details  Name: [name] MRN: [id number] Date of Birth: 14-Oct-1955  Transition of Care [facility]) CM/SW Contact:    Ross Ludwig, LCSW Phone Number: 02/09/2022, 4:02 PM   Clinical Narrative:  CSW received consult that patient will need ETOH substance abuse resources.  Per RN case manager notes from yesterday, patient is not interested in substance abuse res.ources at thim.    CAGE-AID Screening:

## 2022-02-09 NOTE — Discharge Instructions (Signed)

## 2022-02-09 NOTE — Progress Notes (Signed)
Progress Note  Patient Name: [name] Date of Encounter: 02/09/2022  [facility] Cardiologist: Jenkins Rouge, MD   Subjective   Feels well this AM. She was resting comfortably. Removed , was on 2L.   Inpatient Medications    Scheduled Meds:  aspirin EC  81 mg Oral Daily   atorvastatin  40 mg Oral Daily   carvedilol  3.125 mg Oral BID WC   enoxaparin (LOVENOX) injection  40 mg Subcutaneous J69C   folic acid  1 mg Oral Daily   levothyroxine  25 mcg Oral Q0600   mouth rinse  15 mL Mouth Rinse BID   multivitamin with minerals  1 tablet Oral Daily   sodium chloride flush  3 mL Intravenous Q12H   spironolactone  12.5 mg Oral Daily   thiamine  100 mg Oral Daily   Or   thiamine  100 mg Intravenous Daily   Continuous Infusions:  sodium chloride 10 mL/hr at 02/08/22 1505   PRN Meds: sodium chloride, acetaminophen **OR** acetaminophen, LORazepam **OR** LORazepam, polyethylene glycol   Vital Signs    Vitals:   02/08/22 1800 02/08/22 2138 02/09/22 0205 02/09/22 0638  BP: 105/79 108/70 105/90 107/74  Pulse: 82 86 88 72  Resp: '19 19 20 18  '$ Temp: 97.6 F (36.4 C) 98.3 F (36.8 C) 97.7 F (36.5 C) 97.7 F (36.5 C)  TempSrc: Oral Oral Oral Oral  SpO2: 100% 100% 100% 100%  Weight:    76 kg  Height:        Intake/Output Summary (Last 24 hours) at 02/09/2022 1024 Last data filed at 02/09/2022 0600 Gross per 24 hour  Intake 720 ml  Output 2150 ml  Net -1430 ml      02/09/2022    6:38 AM 02/07/2022    9:11 PM 01/29/2022   11:56 AM  Last 3 Weights  Weight (lbs) 167 lb 8.8 oz 170 lb 1.6 oz 172 lb 6.4 oz  Weight (kg) 76 kg 77.157 kg 78.2 kg      Telemetry    NSR - Personally Reviewed  ECG    NA - Personally Reviewed  Physical Exam   Vitals:   02/09/22 0205 02/09/22 0638  BP: 105/90 107/74  Pulse: 88 72  Resp: 20 18  Temp: 97.7 F (36.5 C) 97.7 F (36.5 C)  SpO2: 100% 100%    GEN: No acute distress.   Neck: No JVD Cardiac: RRR, no murmurs, rubs,  or gallops.  Respiratory: Clear to auscultation bilaterally. GI: Soft, nontender, non-distended  MS: No edema; No deformity. Neuro:  Nonfocal  Psych: Normal affect   Labs    High Sensitivity Troponin:   Recent Labs  Lab 02/07/22 1356  TROPONINIHS 10     Chemistry Recent Labs  Lab 02/07/22 1356 02/08/22 0429 02/09/22 0519  NA 139 140 139  K 3.1* 3.6 3.5  CL 108 105 105  CO2 '23 26 28  '$ GLUCOSE 111* 96 101*  BUN 6* 7* 8  CREATININE 0.70 0.79 0.79  CALCIUM 8.5* 8.9 8.8*  MG 1.4*  --  2.0  PROT 7.0 6.8 6.4*  ALBUMIN 3.3* 3.1* 2.9*  AST 56* 51* 36  ALT '29 26 21  '$ ALKPHOS 94 67 62  BILITOT 0.8 0.9 0.7  GFRNONAA >60 >60 >60  ANIONGAP '8 9 6    '$ Lipids No results for input(s): CHOL, TRIG, HDL, LABVLDL, LDLCALC, CHOLHDL in the last 168 hours.  Hematology Recent Labs  Lab 02/07/22 1356 02/08/22 0429 02/09/22 7893  WBC 5.8 8.7 7.7  RBC 3.49* 3.55* 3.37*  HGB 11.7* 11.9* 11.2*  HCT 36.0 36.1 34.6*  MCV 103.2* 101.7* 102.7*  MCH 33.5 33.5 33.2  MCHC 32.5 33.0 32.4  RDW 14.7 14.5 14.2  PLT 117* 113* 114*   Thyroid No results for input(s): TSH, FREET4 in the last 168 hours.  BNP Recent Labs  Lab 02/07/22 1357  BNP 766.2*    DDimer No results for input(s): DDIMER in the last 168 hours.   Radiology    CT Angio Chest PE W and/or Wo Contrast  Result Date: 02/07/2022 CLINICAL DATA:  Pulmonary embolism Shortness of breath EXAM: CT ANGIOGRAPHY CHEST WITH CONTRAST TECHNIQUE: Multidetector CT imaging of the chest was performed using the standard protocol during bolus administration of intravenous contrast. Multiplanar CT image reconstructions and MIPs were obtained to evaluate the vascular anatomy. RADIATION DOSE REDUCTION: This exam was performed according to the departmental dose-optimization program which includes automated exposure control, adjustment of the mA and/or kV according to patient size and/or use of iterative reconstruction technique. CONTRAST:  75 mL OMNIPAQUE  IOHEXOL 350 MG/ML SOLN COMPARISON:  12/02/2021 FINDINGS: Cardiovascular: Single lead left chest wall AICD is seen on scout image. Heart size is within normal limits. Evaluation of the lung bases limited by motion. No pulmonary artery embolism is identified. Mediastinum/Nodes: No enlarged mediastinal, hilar, or axillary lymph nodes. Mild diffuse thickening of the wall the esophagus, particularly in the distal segment. This is best appreciated on image 113 of series 4. Lungs/Pleura: Moderate emphysematous changes the lungs. No focal airspace opacity to indicate pneumonia. Upper Abdomen: Evaluation of the upper abdomen is significantly limited by motion. No acute abnormality is seen. Musculoskeletal: Evaluation for fractures is limited by motion. No definitive acute abnormality of the visualized osseous structures. Review of the MIP images confirms the above findings. IMPRESSION: 1. No pulmonary artery embolism. 2. Evaluation for fractures is significantly limited due to motion. If there is high clinical suspicion for rib or spine fractures, repeat noncontrast chest CT should be performed. 3. Diffuse thickening of the distal esophageal wall most consistent with esophagitis. Correlation with endoscopy should be performed on nonemergent basis to evaluate for underlying mass. Electronically Signed   By: Miachel Roux M.D.   On: 02/07/2022 16:27   DG Chest Portable 1 View  Result Date: 02/07/2022 CLINICAL DATA:  Provided history: Shortness of breath. Additional history provided: Shortness of breath, leg swelling. EXAM: PORTABLE CHEST 1 VIEW COMPARISON:  CT angiogram chest/abdomen/pelvis 12/02/2021. Prior chest radiographs 12/02/2021 and earlier. FINDINGS: Redemonstrated left chest single lead AICD. Heart size within normal limits. Aortic atherosclerosis. Prominence of the interstitial lung markings, greatest at the lung bases, suspicious for interstitial edema. No evidence of pleural effusion or pneumothorax. No acute  bony abnormality identified. IMPRESSION: Prominence of the interstitial lung markings, greatest at the lung bases, suspicious for interstitial edema. Aortic Atherosclerosis (ICD10-I70.0). Electronically Signed   By: Kellie Simmering D.O.   On: 02/07/2022 13:47   ECHOCARDIOGRAM COMPLETE  Result Date: 02/08/2022    ECHOCARDIOGRAM REPORT   Patient Name:   CHARLIZE HATHAWAY Date of Exam: 02/08/2022 Medical Rec #:  [id number]        Height:       69.0 in Accession #:    [id number]       Weight:       170.1 lb Date of Birth:  02/12/1956         BSA:          1.929  m Patient Age:    67 years         BP:           108/85 mmHg Patient Gender: F                HR:           87 bpm. Exam Location:  Inpatient Procedure: 2D Echo, Cardiac Doppler and Color Doppler Indications:    Congestive heart failure  History:        Patient has prior history of Echocardiogram examinations, most                 recent 01/20/2019. CHF, CAD, Defibrillator, COPD; Risk                 Factors:Hypertension. Tobacco and ETOH abuse.  Sonographer:    Joette Catching RCS Referring Phys: [id number] Cataract  1. Left ventricular ejection fraction, by estimation, is 40 to 45%. The left ventricle has mildly decreased function. The left ventricle demonstrates global hypokinesis. Left ventricular diastolic parameters are consistent with Grade I diastolic dysfunction (impaired relaxation).  2. Right ventricular systolic function is normal. The right ventricular size is normal.  3. Left atrial size was mildly dilated.  4. The mitral valve is normal in structure. Mild mitral valve regurgitation.  5. The aortic valve is normal in structure. Aortic valve regurgitation is mild.  6. Aortic no significant ascending aortic aneurysm.  7. The inferior vena cava is dilated in size with >50% respiratory variability, suggesting right atrial pressure of 8 mmHg. Comparison(s): No significant change from prior study. FINDINGS  Left Ventricle: Left ventricular  ejection fraction, by estimation, is 40 to 45%. The left ventricle has mildly decreased function. The left ventricle demonstrates global hypokinesis. The left ventricular internal cavity size was normal in size. There is  borderline left ventricular hypertrophy. Left ventricular diastolic parameters are consistent with Grade I diastolic dysfunction (impaired relaxation). Right Ventricle: The right ventricular size is normal. No increase in right ventricular wall thickness. Right ventricular systolic function is normal. Left Atrium: Left atrial size was mildly dilated. Right Atrium: Right atrial size was normal in size. Pericardium: There is no evidence of pericardial effusion. Mitral Valve: The mitral valve is normal in structure. Mild mitral valve regurgitation. Tricuspid Valve: The tricuspid valve is normal in structure. Tricuspid valve regurgitation is mild. Aortic Valve: The aortic valve is normal in structure. Aortic valve regurgitation is mild. Aortic regurgitation PHT measures 403 msec. Aortic valve mean gradient measures 2.0 mmHg. Aortic valve peak gradient measures 4.0 mmHg. Aortic valve area, by VTI measures 2.03 cm. Pulmonic Valve: The pulmonic valve was grossly normal. Pulmonic valve regurgitation is trivial. Aorta: No significant ascending aortic aneurysm. Venous: The inferior vena cava is dilated in size with greater than 50% respiratory variability, suggesting right atrial pressure of 8 mmHg. IAS/Shunts: No atrial level shunt detected by color flow Doppler. Additional Comments: A device lead is visualized.  LEFT VENTRICLE PLAX 2D LVIDd:         5.40 cm      Diastology LVIDs:         5.00 cm      LV e' medial:    4.68 cm/s LV PW:         1.10 cm      LV E/e' medial:  16.2 LV IVS:        1.00 cm      LV e' lateral:   3.81 cm/s  LVOT diam:     2.10 cm      LV E/e' lateral: 19.9 LV SV:         34 LV SV Index:   18 LVOT Area:     3.46 cm  LV Volumes (MOD) LV vol d, MOD A2C: 107.0 ml LV vol d, MOD A4C:  98.0 ml LV vol s, MOD A2C: 72.5 ml LV vol s, MOD A4C: 70.2 ml LV SV MOD A2C:     34.5 ml LV SV MOD A4C:     98.0 ml LV SV MOD BP:      31.4 ml RIGHT VENTRICLE             IVC RV Basal diam:  3.20 cm     IVC diam: 2.20 cm RV Mid diam:    1.90 cm RV S prime:     11.20 cm/s TAPSE (M-mode): 2.4 cm LEFT ATRIUM             Index        RIGHT ATRIUM           Index LA diam:        3.60 cm 1.87 cm/m   RA Area:     13.60 cm LA Vol (A2C):   49.4 ml 25.61 ml/m  RA Volume:   35.90 ml  18.61 ml/m LA Vol (A4C):   51.7 ml 26.81 ml/m LA Biplane Vol: 55.3 ml 28.67 ml/m  AORTIC VALVE                    PULMONIC VALVE AV Area (Vmax):    2.51 cm     PV Vmax:          0.80 m/s AV Area (Vmean):   2.53 cm     PV Peak grad:     2.5 mmHg AV Area (VTI):     2.03 cm     PR End Diast Vel: 8.29 msec AV Vmax:           100.00 cm/s AV Vmean:          74.600 cm/s AV VTI:            0.169 m AV Peak Grad:      4.0 mmHg AV Mean Grad:      2.0 mmHg LVOT Vmax:         72.50 cm/s LVOT Vmean:        54.500 cm/s LVOT VTI:          0.099 m LVOT/AV VTI ratio: 0.59 AI PHT:            403 msec  AORTA Ao Root diam: 3.10 cm Ao Asc diam:  3.50 cm MITRAL VALVE                  TRICUSPID VALVE MV Area (PHT): 12.64 cm      TR Peak grad:   33.2 mmHg MV Decel Time: 60 msec        TR Vmax:        288.00 cm/s MR Peak grad:    61.7 mmHg MR Mean grad:    48.0 mmHg    SHUNTS MR Vmax:         392.67 cm/s  Systemic VTI:  0.10 m MR Vmean:        333.0 cm/s   Systemic Diam: 2.10 cm MR PISA:         0.57 cm MR PISA Eff ROA: 6  mm MR PISA Radius:  0.30 cm MV E velocity: 75.80 cm/s MV A velocity: 90.55 cm/s MV E/A ratio:  0.84 MV A Prime:    12.0 cm/s Phineas Inches Electronically signed by Phineas Inches Signature Date/Time: 02/08/2022/10:08:35 AM    Final    VAS [place] LOWER EXTREMITY VENOUS (DVT) (ONLY [facility] & [facility])  Result Date: 02/07/2022  Lower Venous DVT Study Patient Name:  TAMESHIA BONNEVILLE  Date of Exam:   02/07/2022 Medical Rec #: [id number]         Accession #:     [id number] Date of Birth: 03/09/1956          Patient Gender: F Patient Age:   47 years Exam Location:  [facility] Procedure:      VAS [place] LOWER EXTREMITY VENOUS (DVT) Referring Phys: Rayna Sexton --------------------------------------------------------------------------------  Indications: SOB, swelling.  Comparison Study: No prior venous studies. Extensive arterial history. Performing Technologist: Darlin Coco RDMS, RVT  Examination Guidelines: A complete evaluation includes B-mode imaging, spectral Doppler, color Doppler, and power Doppler as needed of all accessible portions of each vessel. Bilateral testing is considered an integral part of a complete examination. Limited examinations for reoccurring indications may be performed as noted. The reflux portion of the exam is performed with the patient in reverse Trendelenburg.  +---------+---------------+---------+-----------+----------+--------------+ RIGHT    CompressibilityPhasicitySpontaneityPropertiesThrombus Aging +---------+---------------+---------+-----------+----------+--------------+ CFV      Full           Yes      Yes                                 +---------+---------------+---------+-----------+----------+--------------+ SFJ      Full                                                        +---------+---------------+---------+-----------+----------+--------------+ FV Prox  Full                                                        +---------+---------------+---------+-----------+----------+--------------+ FV Mid   Full                                                        +---------+---------------+---------+-----------+----------+--------------+ FV DistalFull                                                        +---------+---------------+---------+-----------+----------+--------------+ PFV      Full                                                         +---------+---------------+---------+-----------+----------+--------------+ POP  Full           Yes      Yes                                 +---------+---------------+---------+-----------+----------+--------------+ PTV      Full                                                        +---------+---------------+---------+-----------+----------+--------------+ PERO     Full                                                        +---------+---------------+---------+-----------+----------+--------------+ Gastroc  Full                                                        +---------+---------------+---------+-----------+----------+--------------+   +---------+---------------+---------+-----------+----------+--------------+ LEFT     CompressibilityPhasicitySpontaneityPropertiesThrombus Aging +---------+---------------+---------+-----------+----------+--------------+ CFV      Full           Yes      Yes                                 +---------+---------------+---------+-----------+----------+--------------+ SFJ      Full                                                        +---------+---------------+---------+-----------+----------+--------------+ FV Prox  Full                                                        +---------+---------------+---------+-----------+----------+--------------+ FV Mid   Full                                                        +---------+---------------+---------+-----------+----------+--------------+ FV DistalFull                                                        +---------+---------------+---------+-----------+----------+--------------+ PFV      Full                                                        +---------+---------------+---------+-----------+----------+--------------+  POP      Full           Yes      Yes                                  +---------+---------------+---------+-----------+----------+--------------+ PTV      Full                                                        +---------+---------------+---------+-----------+----------+--------------+ PERO     Full                                                        +---------+---------------+---------+-----------+----------+--------------+ Gastroc  Full                                                        +---------+---------------+---------+-----------+----------+--------------+     Summary: RIGHT: - There is no evidence of deep vein thrombosis in the lower extremity.  - No cystic structure found in the popliteal fossa.  LEFT: - There is no evidence of deep vein thrombosis in the lower extremity.  - No cystic structure found in the popliteal fossa.  *See table(s) above for measurements and observations. Electronically signed by Orlie Pollen on 02/07/2022 at 5:24:38 PM.    Final     Cardiac Studies   TTE 02/08/2022  1. Left ventricular ejection fraction, by estimation, is 40 to 45%. The  left ventricle has mildly decreased function. The left ventricle  demonstrates global hypokinesis. Left ventricular diastolic parameters are  consistent with Grade I diastolic  dysfunction (impaired relaxation).   2. Right ventricular systolic function is normal. The right ventricular  size is normal.   3. Left atrial size was mildly dilated.   4. The mitral valve is normal in structure. Mild mitral valve  regurgitation.   5. The aortic valve is normal in structure. Aortic valve regurgitation is  mild.   6. Aortic no significant ascending aortic aneurysm.   7. The inferior vena cava is dilated in size with >50% respiratory  variability, suggesting right atrial pressure of 8 mmHg.  Patient Profile     SOKHA CRAKER is a 66 y.o. female with a hx of CAD s/p PCI OM, chronic systolic and diastolic heart failure, PAD, dilated cardiomyopathy and VT s/p ICD who is being  seen 02/08/2022 for the evaluation of heart failure at the request of Dr. Algis Liming.  Assessment & Plan    Acute on chronic systolic and diastolic heart failure: EF 40-45%, RAP 8, E/e' slightly above 15. No significant valve diseaseShe is euvolemic this AM. BNP was elevated with Cxray showing interstitial edema, may be flash. She is lying flat comfortably and no LE edema. - euvolemic - Bps are still soft, can hold entresto  ( 97-103 BID) until FU -continue coreg 3.125 mg BID (reduced from home dose core 12.5 mg BID) -continue spironolactone 12.5 mg daily - can DC  with lasix 40 mg daily PRN for weight gain > 5 pounds in one week, leg swelling, shortness of breath overnight. Salt restriction < 2g.  -admission weight 77.2 kg, now 76 kg. Weight in office 10/23/21 with Dr. [name] Anon 78.9 kg   CAD: stable PAD -continue aspirin, statin -hsTn normal   History of VT S/P ICD - no arrhythmia -keep K>4, Mg>2 -monitor on telemetry  [facility] will sign off.   Medication Recommendations:  per above Other recommendations (labs, testing, etc):  BMET and Mg 2+ in 2 weeks Follow up as an outpatient:  we can arrange FU  For questions or updates, please contact [place] Please consult [email or web address] for contact info under        Signed, Janina Mayo, MD  02/09/2022, 10:24 AM

## 2022-02-09 NOTE — TOC Transition Note (Addendum)
Transition of Care [facility]) - CM/SW Discharge Note   Patient Details  Name: [name] MRN: [id number] Date of Birth: Sep 30, 1955  Transition of Care [facility]) CM/SW Contact:  Ross Ludwig, LCSW Phone Number: 02/09/2022, 4:06 PM   Clinical Narrative:     CSW received consult that patient will need ETOH substance abuse resources.  Per RN case manager notes from yesterday, patient is not interested in substance abuse resources at time.  CSW signing off, please reconsult if other social work needs arise.  Final next level of care: Home/Self Care Barriers to Discharge: Continued Medical Work up   Patient Goals and CMS Choice Patient states their goals for this hospitalization and ongoing recovery are:: home CMS Medicare.gov Compare Post Acute Care list provided to:: Patient Choice offered to / list presented to : Patient  Discharge Placement                       Discharge Plan and Services   Discharge Planning Services: CM Consult                                 Social Determinants of Health (SDOH) Interventions     Readmission Risk Interventions     View : No data to display.

## 2022-02-13 ENCOUNTER — Ambulatory Visit (INDEPENDENT_AMBULATORY_CARE_PROVIDER_SITE_OTHER): Payer: Medicare Other

## 2022-02-13 DIAGNOSIS — Z95810 Presence of automatic (implantable) cardiac defibrillator: Secondary | ICD-10-CM

## 2022-02-13 DIAGNOSIS — I5022 Chronic systolic (congestive) heart failure: Secondary | ICD-10-CM

## 2022-02-13 NOTE — Progress Notes (Signed)
EPIC Encounter for ICM Monitoring  Patient Name: [name] is a 66 y.o. female Date: 02/13/2022 Primary Care Physican: Berna Bue Primary Cardiologist: Johnsie Cancel Electrophysiologist: Lovena Le 12/28/2021 Weight: 175 lbs  01/29/2022 Weight: 175 lbs 02/05/2022 Weight: 175 lbs 02/11/2022 Weight: 168 lbs (discharge weight) 02/12/2022 Weight: 170.2 lbs 02/13/2022 Weight: 170.9 lbs                                                          Spoke with patient and heart failure questions reviewed.  Pt reports feeling much better and no fluid symptoms since hospital discharge on 6/3.     Optivol thoracic impedance suggest fluid levels returned to normal after patient hospitalized for CHF.       Prescribed: Furosemide 40 mg Take 1 tablet (40 mg total) by mouth daily as needed for fluid or edema (Anytime you have any of the following symptoms: 1) 3 pound weight gain in 24 hours or 5 pounds in 1 week 2) shortness of breath, with or without a dry hacking cough 3) swelling in the hands, feet or stomach 4) if you have to sleep on extra pillows at night in order to breathe.). Spironolactone 25 mg take 0.5 tablet (12.5 mg total)  by mouth daily.   Labs: 01/29/2022 Creatinine 0.82, BUN 7, Potassium 3.7, Sodium 139, GFR >60 12/02/2021 Creatinine 0.77, BUN 12, Potassium 4.4, Sodium 136, GFR >60  A complete set of results can be found in Results Review.   Recommendations:    Advised the importance of limiting salt intake to 2000 mg daily and fluid intake to 64 oz daily.     Follow-up plan: ICM clinic phone appointment on 02/20/2022 to recheck fluid levels.   91 day device clinic remote transmission 03/15/2022.     EP/Cardiology Office Visits:  02/21/2022 with Dr Johnsie Cancel.   Recall 10/27/2021 with Dr Lovena Le, Tommye Standard PA or Estrella Myrtle, [place].   Copy of ICM check sent to Dr. Lovena Le.    3 month ICM trend: 02/11/2022.    12-14 Month ICM trend:     [name], RN 02/13/2022 9:42 AM

## 2022-02-18 NOTE — Progress Notes (Signed)
We attempted video but she could not do at this time  Date:  02/21/2022   ID:  [name], DOB Nov 10, 1955, MRN [id number]   PCP:  Myrtie Neither, PA-C  Cardiologist:  Jenkins Rouge, MD  Electrophysiologist:  Cristopher Peru, MD   Evaluation Performed:  Follow-Up Visit  Chief Complaint:  CAD  History of Present Illness:     66 y.o. 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 Known PVD with bilateral SFA occlusions July 2019 has relapse with EF 20-25% cath with patent stent and mild dx elsewhere There seems to be and issue with ETOH abuse    Seen by pulmonary 06/08/18 Gold stage 2 and on Spiriva now  D/C from hospital 02/09/22 for volume overload Pedal edema and optivol elevated Diuresed and readings back to normal TTE 02/08/22 EF 40-45% NOrmal RV mild MR mild AR BNP was elevated at 1069 D/C K 3.5 and Cr 0.79 LE venous US with no DVT Entresto d/c  due to soft BP D/c with coreg 3.125 bid lasix 40 mg daily and aldactone 12.5 mg daily   She feels great edema gone BP/HR up discussed getting back on entresto and higher dose coreg    Past Medical History:  Diagnosis Date   Acute on chronic systolic heart failure (HCC)    Acute respiratory failure (HCC)    Automatic implantable cardioverter-defibrillator in situ    CAD (coronary artery disease)    a. Inf-post MI 2008 s/p BMS to large marginal of Cx.    COPD (chronic obstructive pulmonary disease) ([place])    ETOH abuse    Hyperglycemia    HYPERLIPIDEMIA-MIXED 02/07/2009   HYPERTENSION, BENIGN 08/22/2009   PVD (peripheral vascular disease) ([place])    a. Evaluated by Dr. [name] Anon 08/2012.   SYSTOLIC HEART FAILURE, CHRONIC 02/07/2009   a. EF 30% 2010, 29% by MRI 08/2012. b. s/p prophylactic Medtronic ICD implantation 10/2012.   TIA (transient ischemic attack) 05/18/2014   Tobacco abuse    VENTRICULAR TACHYCARDIA 05/21/2010   Past Surgical History:  Procedure  Laterality Date   CARDIAC DEFIBRILLATOR PLACEMENT  10/2012   CESAREAN SECTION  1977; 1982   COLONOSCOPY     CYSTOSCOPY W/ STONE MANIPULATION  1990's   ESOPHAGOGASTRODUODENOSCOPY (EGD) WITH PROPOFOL N/A 08/04/2018   Procedure: ESOPHAGOGASTRODUODENOSCOPY (EGD) WITH PROPOFOL;  Surgeon: Gatha Mayer, MD;  Location: [facility];  Service: Endoscopy;  Laterality: N/A;   IMPLANTABLE CARDIOVERTER DEFIBRILLATOR IMPLANT N/A 10/21/2012   Procedure: IMPLANTABLE CARDIOVERTER DEFIBRILLATOR IMPLANT;  Surgeon: Evans Lance, MD;  Location: [facility] CATH LAB;  Service: Cardiovascular;  Laterality: N/A;   RIGHT/LEFT HEART CATH AND CORONARY ANGIOGRAPHY N/A 03/26/2018   Procedure: RIGHT/LEFT HEART CATH AND CORONARY ANGIOGRAPHY;  Surgeon: Nelva Bush, MD;  Location: [place] CV LAB;  Service: Cardiovascular;  Laterality: N/A;     Current Meds  Medication Sig   aspirin EC 81 MG tablet Take 81 mg by mouth daily.   carvedilol (COREG) 3.125 MG tablet Take 1 tablet (3.125 mg total) by mouth 2 (two) times daily with a meal.   folic acid (FOLVITE) 1 MG tablet Take 1 tablet (1 mg total) by mouth daily.   furosemide (LASIX) 40 MG tablet Take 1 tablet (40 mg total) by mouth daily as needed for fluid or edema (Anytime you have any of the following symptoms: 1) 3 pound weight gain in 24 hours or 5  pounds in 1 week 2) shortness of breath, with or without a dry hacking cough 3) swelling in the hands, feet or stomach 4) if you have to sleep on extra pillows at night in order to breathe.).   levothyroxine (SYNTHROID) 25 MCG tablet Take 25 mcg by mouth daily.   Misc. Devices MISC Use scale daily to monitor weight.   Multiple Vitamin (MULTIVITAMIN WITH MINERALS) TABS tablet Take 1 tablet by mouth daily.   nitroGLYCERIN (NITROSTAT) 0.4 MG SL tablet Place 1 tablet (0.4 mg total) under the tongue every 5 (five) minutes as needed for chest pain (up to 3 doses).   spironolactone (ALDACTONE) 25 MG tablet Take 0.5 tablets (12.5 mg  total) by mouth daily.   thiamine 100 MG tablet Take 1 tablet (100 mg total) by mouth daily.     Allergies:   Clopidogrel bisulfate   Social History   Tobacco Use   Smoking status: Former    Packs/day: 0.50    Years: 40.00    Total pack years: 20.00    Types: Cigarettes    Quit date: 04/27/2018    Years since quitting: 3.8   Smokeless tobacco: Never   Tobacco comments:    pt is on nicotine patches   Vaping Use   Vaping Use: Never used  Substance Use Topics   Alcohol use: Yes    Alcohol/week: 20.0 standard drinks of alcohol    Types: 20 Cans of beer per week    Comment: 05/18/2014 "2, 40's qod"; 1 beer last week    Drug use: No     Family Hx: The patient's family history includes Asthma in her brother; Cancer in her mother; Heart attack in her father; Other in her brother.  ROS:   Please see the history of present illness.    General:no colds or fevers, + weight loss, she put herself on a diet Skin:no rashes or ulcers HEENT:no blurred vision, no congestion CV:see HPI PUL:see HPI GI:no diarrhea constipation or melena, no indigestion GU:no hematuria, no dysuria MS:no joint pain, no claudication Neuro:no syncope, no lightheadedness Endo:no diabetes, no thyroid disease  All other systems reviewed and are negative.   Prior CV studies:   The following studies were reviewed today:  Echo 01/20/19 IMPRESSIONS      1. Severe hypokinesis of the left ventricular, basal-mid inferolateral wall, anterolateral wall and inferior wall.  2. The left ventricle has mild-moderately reduced systolic function, with an ejection fraction of 40-45%. The cavity size was mildly dilated. Left ventricular diastolic Doppler parameters are consistent with impaired relaxation. Elevated mean left  atrial pressure.  3. The right ventricle has normal systolic function. The cavity was normal. There is no increase in right ventricular wall thickness.  4. The aortic valve is tricuspid. Aortic valve  regurgitation is moderate by color flow Doppler.   FINDINGS  Left Ventricle: The left ventricle has mild-moderately reduced systolic function, with an ejection fraction of 40-45%. The cavity size was mildly dilated. There is no increase in left ventricular wall thickness. Left ventricular diastolic Doppler  parameters are consistent with impaired relaxation. Elevated mean left atrial pressure Severe hypokinesis of the left ventricular, basal-mid inferolateral wall, anterolateral wall and inferior wall.   Right Ventricle: The right ventricle has normal systolic function. The cavity was normal. There is no increase in right ventricular wall thickness. Pacing wire/catheter visualized in the right ventricle.   Left Atrium: Left atrial size was normal in size.   Right Atrium: Right atrial size was normal  in size. Right atrial pressure is estimated at 10 mmHg.   Interatrial Septum: No atrial level shunt detected by color flow Doppler.   Pericardium: There is no evidence of pericardial effusion.   Mitral Valve: The mitral valve is normal in structure. Mitral valve regurgitation is mild by color flow Doppler.   Tricuspid Valve: The tricuspid valve is normal in structure. Tricuspid valve regurgitation is trivial by color flow Doppler.   Aortic Valve: The aortic valve is tricuspid Aortic valve regurgitation is moderate by color flow Doppler.   Pulmonic Valve: The pulmonic valve was normal in structure. Pulmonic valve regurgitation is trivial by color flow Doppler.   Venous: The inferior vena cava is normal in size with greater than 50% respiratory variability.       Labs/Other Tests and Data Reviewed:    EKG:  An ECG dated 01/03/19 was personally reviewed today and demonstrated:  SR with Q waves III, AVF, deep T wave V5-6  Recent Labs: 08/24/2021: NT-Pro BNP 252 02/07/2022: B Natriuretic Peptide 766.2 02/09/2022: ALT 21; BUN 8; Creatinine, Ser 0.79; Hemoglobin 11.2; Magnesium 2.0; Platelets 114;  Potassium 3.5; Sodium 139   Recent Lipid Panel Lab Results  Component Value Date/Time   CHOL 130 03/23/2018 09:00 AM   CHOL 153 02/24/2017 11:54 AM   TRIG 66 03/23/2018 09:00 AM   HDL 39 (L) 03/23/2018 09:00 AM   HDL 74 02/24/2017 11:54 AM   CHOLHDL 3.3 03/23/2018 09:00 AM   LDLCALC 78 03/23/2018 09:00 AM   LDLCALC 67 02/24/2017 11:54 AM    Wt Readings from Last 3 Encounters:  02/21/22 170 lb (77.1 kg)  02/09/22 167 lb 8.8 oz (76 kg)  01/29/22 172 lb 6.4 oz (78.2 kg)     Objective:    Vital Signs:  BP 138/88   Pulse 99   Ht '5\' 9"'$  (1.753 m)   Wt 170 lb (77.1 kg)   SpO2 96%   BMI 25.10 kg/m    Affect appropriate Chronically ill black female  HEENT: normal Neck supple with no adenopathy JVP normal no bruits no thyromegaly Lungs clear with no wheezing and good diaphragmatic motion Heart:  S1/S2 no murmur, no rub, gallop or click PMI increased AICD under left clavicle  Abdomen: benighn, BS positve, no tenderness, no AAA no bruit.  No HSM or HJR Distal pulses intact with no bruits No edema Neuro non-focal Skin warm and dry No muscular weakness   ASSESSMENT & PLAN:    NICM with EF to 25-30% on echo, now EF improved to 40-45% Continue lower dose coreg and Lasix/aldactone Monitor Optivol remotely Resume low dose Entresto and increase coreg to 6.25 mg bid   AICD:  No shocks normal function f/u [employer] Medtronic device   CAD patent stent in OM no residual dx cath 03/26/18 continue medical Rx  HLD on statin labs with primary   HTN Well controlled.  Continue current medications and low sodium Dash type diet.    Tobacco use, has stopped  + COPD and followed by Pulmonary CTA 02/07/22 with moderate emphysema diffuse bronchial thickening no cancer and no PE Esophageal thickening: seen on recent CT needs GI f/u and possible endoscopy Seen by Carlean Purl in 2019 and no issues felt thickening from contraction     Medication Adjustments/Labs and Tests Ordered: Current medicines  are reviewed at length with the patient today.  Concerns regarding medicines are outlined above.   Tests Ordered: No orders of the defined types were placed in this encounter.  BMET/BNP  3 weeks   Medication Changes: No orders of the defined types were placed in this encounter.  Refill on Aldactone called in has been non compliant with this   Disposition:  Follow up in 6 months   Signed, Jenkins Rouge, MD  02/21/2022 10:16 AM    [place]

## 2022-02-20 ENCOUNTER — Ambulatory Visit (INDEPENDENT_AMBULATORY_CARE_PROVIDER_SITE_OTHER): Payer: Medicare Other

## 2022-02-20 DIAGNOSIS — Z95810 Presence of automatic (implantable) cardiac defibrillator: Secondary | ICD-10-CM

## 2022-02-20 DIAGNOSIS — I5022 Chronic systolic (congestive) heart failure: Secondary | ICD-10-CM

## 2022-02-20 NOTE — Progress Notes (Signed)
EPIC Encounter for ICM Monitoring  Patient Name: [name] is a 66 y.o. female Date: 02/20/2022 Primary Care Physican: Berna Bue Primary Cardiologist: Johnsie Cancel Electrophysiologist: Lovena Le 12/28/2021 Weight: 175 lbs  01/29/2022 Weight: 175 lbs 02/05/2022 Weight: 175 lbs 02/11/2022 Weight: 168 lbs (discharge weight) 02/12/2022 Weight: 170.2 lbs 02/13/2022 Weight: 170.9 lbs                                                          Spoke with patient and heart failure questions reviewed.  Pt reports feeling fine since hospital discharge.   Optivol thoracic impedance suggest fluid levels continue to be normal after hospital discharge.       Prescribed: Furosemide 40 mg Take 1 tablet (40 mg total) by mouth daily as needed for fluid or edema (Anytime you have any of the following symptoms: 1) 3 pound weight gain in 24 hours or 5 pounds in 1 week 2) shortness of breath, with or without a dry hacking cough 3) swelling in the hands, feet or stomach 4) if you have to sleep on extra pillows at night in order to breathe.). Spironolactone 25 mg take 0.5 tablet (12.5 mg total)  by mouth daily.   Labs: 01/29/2022 Creatinine 0.82, BUN 7, Potassium 3.7, Sodium 139, GFR >60 12/02/2021 Creatinine 0.77, BUN 12, Potassium 4.4, Sodium 136, GFR >60  A complete set of results can be found in Results Review.   Recommendations:    Advised the importance of limiting salt intake to 2000 mg daily and fluid intake to 64 oz daily.     Follow-up plan: ICM clinic phone appointment on 03/04/2022.   91 day device clinic remote transmission 03/15/2022.     EP/Cardiology Office Visits:  02/21/2022 with Dr Johnsie Cancel.   Recall 10/27/2021 with Dr Lovena Le, Tommye Standard PA or Estrella Myrtle, [place].   Copy of ICM check sent to Dr. Lovena Le.    3 month ICM trend: 02/20/2022.    12-14 Month ICM trend:     [name], RN 02/20/2022 12:35 PM

## 2022-02-21 ENCOUNTER — Encounter: Payer: Self-pay | Admitting: Cardiovascular Disease

## 2022-02-21 ENCOUNTER — Ambulatory Visit (INDEPENDENT_AMBULATORY_CARE_PROVIDER_SITE_OTHER): Payer: Medicare Other | Admitting: Cardiovascular Disease

## 2022-02-21 VITALS — BP 138/88 | HR 99 | Ht 69.0 in | Wt 170.0 lb

## 2022-02-21 DIAGNOSIS — I10 Essential (primary) hypertension: Secondary | ICD-10-CM

## 2022-02-21 DIAGNOSIS — Z95810 Presence of automatic (implantable) cardiac defibrillator: Secondary | ICD-10-CM | POA: Diagnosis not present

## 2022-02-21 DIAGNOSIS — I4729 Other ventricular tachycardia: Secondary | ICD-10-CM

## 2022-02-21 DIAGNOSIS — I5022 Chronic systolic (congestive) heart failure: Secondary | ICD-10-CM | POA: Diagnosis not present

## 2022-02-21 MED ORDER — CARVEDILOL 6.25 MG PO TABS: 6.2500 mg | ORAL_TABLET | Freq: Two times a day (BID) | ORAL | 3 refills | Status: DC

## 2022-02-21 MED ORDER — ENTRESTO 24-26 MG PO TABS: 1.0000 | ORAL_TABLET | Freq: Two times a day (BID) | ORAL | 11 refills | Status: DC

## 2022-02-21 NOTE — Patient Instructions (Addendum)
Medication Instructions:  Your physician has recommended you make the following change in your medication: 1-INCREASE Carvedilol 6.25 mg by mouth twice daily. 2-START Entresto 24/26 mg by mouth twice daily.  *If you need a refill on your cardiac medications before your next appointment, please call your pharmacy*  Lab Work: Your physician recommends that you return for lab work in: 3 weeks BMET and BNP   If you have labs (blood work) drawn today and your tests are completely normal, you will receive your results only by: [place] (if you have [facility]) OR A paper copy in the mail If you have any lab test that is abnormal or we need to change your treatment, we will call you to review the results.  Testing/Procedures: None ordered today.  Follow-Up: At [facility], you and your health needs are our priority.  As part of our continuing mission to provide you with exceptional heart care, we have created designated Provider Care Teams.  These Care Teams include your primary Cardiologist (physician) and Advanced Practice Providers (APPs -  Physician Assistants and Nurse Practitioners) who all work together to provide you with the care you need, when you need it.  We recommend signing up for the patient portal called "MyChart".  Sign up information is provided on this After Visit Summary.  MyChart is used to connect with patients for Virtual Visits (Telemedicine).  Patients are able to view lab/test results, encounter notes, upcoming appointments, etc.  Non-urgent messages can be sent to your provider as well.   To learn more about what you can do with MyChart, go to [email or web address].    Your next appointment:   6 month(s)  The format for your next appointment:   In Person  Provider:   Jenkins Rouge, MD {    Important Information About Sugar

## 2022-03-04 ENCOUNTER — Ambulatory Visit (INDEPENDENT_AMBULATORY_CARE_PROVIDER_SITE_OTHER): Payer: Medicare Other

## 2022-03-04 DIAGNOSIS — Z95810 Presence of automatic (implantable) cardiac defibrillator: Secondary | ICD-10-CM

## 2022-03-04 DIAGNOSIS — I5022 Chronic systolic (congestive) heart failure: Secondary | ICD-10-CM

## 2022-03-08 ENCOUNTER — Telehealth: Payer: Self-pay

## 2022-03-08 NOTE — Progress Notes (Signed)
EPIC Encounter for ICM Monitoring  Patient Name: [name] is a 66 y.o. female Date: 03/08/2022 Primary Care Physican: Berna Bue Primary Cardiologist: Johnsie Cancel Electrophysiologist: Lovena Le 02/05/2022 Weight: 175 lbs 02/11/2022 Weight: 168 lbs (discharge weight) 02/12/2022 Weight: 170.2 lbs 02/13/2022 Weight: 170.9 lbs                                                          Attempted call to patient and unable to reach.  Transmission reviewed.    Optivol thoracic impedance suggest fluid levels continue to be normal since hospital discharge.       Prescribed: Furosemide 40 mg Take 1 tablet (40 mg total) by mouth daily as needed for fluid or edema (Anytime you have any of the following symptoms: 1) 3 pound weight gain in 24 hours or 5 pounds in 1 week 2) shortness of breath, with or without a dry hacking cough 3) swelling in the hands, feet or stomach 4) if you have to sleep on extra pillows at night in order to breathe.). Spironolactone 25 mg take 0.5 tablet (12.5 mg total)  by mouth daily.   Labs: 01/29/2022 Creatinine 0.82, BUN 7, Potassium 3.7, Sodium 139, GFR >60 12/02/2021 Creatinine 0.77, BUN 12, Potassium 4.4, Sodium 136, GFR >60  A complete set of results can be found in Results Review.   Recommendations:   Unable to reach.      Follow-up plan: ICM clinic phone appointment on 04/08/2022.   91 day device clinic remote transmission 03/15/2022.     EP/Cardiology Office Visits:  08/22/2022 with Dr Johnsie Cancel.   Recall 10/27/2021 with Dr Lovena Le, Tommye Standard PA or Estrella Myrtle, [place].   Copy of ICM check sent to Dr. Lovena Le.    3 month ICM trend: 03/04/2022.    12-14 Month ICM trend:     [name], RN 03/08/2022 8:21 AM

## 2022-03-08 NOTE — Telephone Encounter (Signed)
Remote ICM transmission received.  Attempted call to patient regarding ICM remote transmission and recording stated cannot be completed as dialed.

## 2022-03-14 ENCOUNTER — Other Ambulatory Visit: Payer: Medicare Other

## 2022-03-15 ENCOUNTER — Ambulatory Visit (INDEPENDENT_AMBULATORY_CARE_PROVIDER_SITE_OTHER): Payer: Medicare Other

## 2022-03-15 DIAGNOSIS — I428 Other cardiomyopathies: Secondary | ICD-10-CM

## 2022-03-16 LAB — CUP PACEART REMOTE DEVICE CHECK
Battery Remaining Longevity: 28 mo
Battery Voltage: 2.94 V
Brady Statistic RV Percent Paced: 0.02 %
Date Time Interrogation Session: 20230707072508
HighPow Impedance: 82 Ohm
Implantable Lead Implant Date: 20140212
Implantable Lead Location: 753860
Implantable Lead Model: 6935
Implantable Pulse Generator Implant Date: 20140212
Lead Channel Impedance Value: 437 Ohm
Lead Channel Impedance Value: 494 Ohm
Lead Channel Pacing Threshold Amplitude: 0.75 V
Lead Channel Pacing Threshold Pulse Width: 0.4 ms
Lead Channel Sensing Intrinsic Amplitude: 9.875 mV
Lead Channel Sensing Intrinsic Amplitude: 9.875 mV
Lead Channel Setting Pacing Amplitude: 2.5 V
Lead Channel Setting Pacing Pulse Width: 0.4 ms
Lead Channel Setting Sensing Sensitivity: 0.3 mV

## 2022-03-21 ENCOUNTER — Other Ambulatory Visit: Payer: Medicare Other

## 2022-03-21 DIAGNOSIS — I4729 Other ventricular tachycardia: Secondary | ICD-10-CM

## 2022-03-21 DIAGNOSIS — I10 Essential (primary) hypertension: Secondary | ICD-10-CM

## 2022-03-21 DIAGNOSIS — Z95810 Presence of automatic (implantable) cardiac defibrillator: Secondary | ICD-10-CM

## 2022-03-21 DIAGNOSIS — I5022 Chronic systolic (congestive) heart failure: Secondary | ICD-10-CM

## 2022-03-22 LAB — BASIC METABOLIC PANEL
BUN/Creatinine Ratio: 9 — ABNORMAL LOW (ref 12–28)
BUN: 6 mg/dL — ABNORMAL LOW (ref 8–27)
CO2: 24 mmol/L (ref 20–29)
Calcium: 9.5 mg/dL (ref 8.7–10.3)
Chloride: 105 mmol/L (ref 96–106)
Creatinine, Ser: 0.67 mg/dL (ref 0.57–1.00)
Glucose: 105 mg/dL — ABNORMAL HIGH (ref 70–99)
Potassium: 4.3 mmol/L (ref 3.5–5.2)
Sodium: 142 mmol/L (ref 134–144)
eGFR: 98 mL/min/{1.73_m2} (ref >59)

## 2022-03-22 LAB — PRO B NATRIURETIC PEPTIDE: NT-Pro BNP: 888 pg/mL — ABNORMAL HIGH (ref 0–287)

## 2022-04-01 NOTE — Progress Notes (Signed)
Remote ICD transmission.   

## 2022-04-08 ENCOUNTER — Ambulatory Visit (INDEPENDENT_AMBULATORY_CARE_PROVIDER_SITE_OTHER): Payer: Medicare Other

## 2022-04-08 DIAGNOSIS — I5022 Chronic systolic (congestive) heart failure: Secondary | ICD-10-CM | POA: Diagnosis not present

## 2022-04-08 DIAGNOSIS — Z95810 Presence of automatic (implantable) cardiac defibrillator: Secondary | ICD-10-CM

## 2022-04-11 NOTE — Progress Notes (Signed)
EPIC Encounter for ICM Monitoring  Patient Name: [name] is a 66 y.o. female Date: 04/11/2022 Primary Care Physican: Berna Bue Primary Cardiologist: Johnsie Cancel Electrophysiologist: Lovena Le 02/05/2022 Weight: 175 lbs 02/11/2022 Weight: 168 lbs (discharge weight) 02/12/2022 Weight: 170.2 lbs 02/13/2022 Weight: 170.9 lbs 8/3/202 Weight: 170 lbs                                                          Spoke with patient and heart failure questions reviewed.  Pt asymptomatic for fluid accumulation.  Reports feeling well at this time and voices no complaints.    Optivol thoracic impedance suggesting normal fluid levels.       Prescribed: Furosemide 40 mg Take 1 tablet (40 mg total) by mouth daily as needed for fluid or edema (Anytime you have any of the following symptoms: 1) 3 pound weight gain in 24 hours or 5 pounds in 1 week 2) shortness of breath, with or without a dry hacking cough 3) swelling in the hands, feet or stomach 4) if you have to sleep on extra pillows at night in order to breathe.). Spironolactone 25 mg take 0.5 tablet (12.5 mg total)  by mouth daily.   Labs: 03/21/2022 Creatinine 0.67, BUN 6, Potassium 4.3, Sodium 142, GFR 98 02/09/2022 Creatinine 0.79, BUN 8, Potassium 3.5, Sodium 139  02/08/2022 Creatinine 0.79, BUN 7, Potassium 3.6, Sodium 140  02/07/2022 Creatinine 0.70, BUN 6, Potassium 3.1, Sodium 139  01/29/2022 Creatinine 0.82, BUN 7, Potassium 3.7, Sodium 139, GFR >60 12/02/2021 Creatinine 0.77, BUN 12, Potassium 4.4, Sodium 136, GFR >60  A complete set of results can be found in Results Review.   Recommendations:   No changes and encouraged to call if experiencing any fluid symptoms.   Follow-up plan: ICM clinic phone appointment on 05/14/2022.   91 day device clinic remote transmission 06/14/2022.     EP/Cardiology Office Visits:  08/22/2022 with Dr Johnsie Cancel.   Recall 10/27/2021 with Dr Lovena Le, Tommye Standard PA or Lytle Michaels, [place].   Copy of ICM check sent  to Dr. Lovena Le.    3 month ICM trend: 04/08/2022.    12-14 Month ICM trend:     [name], RN 04/11/2022 1:00 PM

## 2022-04-18 ENCOUNTER — Telehealth: Payer: Self-pay | Admitting: Cardiovascular Disease

## 2022-04-18 NOTE — Telephone Encounter (Signed)
Pt is calling requesting a refill on folic acid. Would Dr. Asa Lente like to refill this medication? Thanks

## 2022-04-18 NOTE — Telephone Encounter (Signed)
Called pt to inform her per Jeannene Patella, RN, Dr. Kyla Balzarine nurse, for pt to contact PCP to refill pt medication folic acid. I advised pt that if she has any other problems, questions or concerns, to give our office a call back. Pt verbalized understanding.

## 2022-04-18 NOTE — Telephone Encounter (Signed)
*  STAT* If patient is at the pharmacy, call can be transferred to refill team.   1. Which medications need to be refilled? (please list name of each medication and dose if known)  folic acid (FOLVITE) 1 MG tablet  2. Which pharmacy/location (including street and city if local pharmacy) is medication to be sent to? [id number]- [place], [place] - [address]  3. Do they need a 30 day or 90 day supply?  90 day supply Patient is completely out of medication.

## 2022-05-17 ENCOUNTER — Telehealth: Payer: Self-pay

## 2022-05-17 NOTE — Telephone Encounter (Signed)
Received voice mail message from  patient.  She stated her new phone number is [phone]

## 2022-05-17 NOTE — Progress Notes (Signed)
No ICM remote transmission received for 05/14/2022 and next ICM transmission scheduled for 06/03/2022.

## 2022-06-03 ENCOUNTER — Ambulatory Visit (INDEPENDENT_AMBULATORY_CARE_PROVIDER_SITE_OTHER): Payer: Medicare Other

## 2022-06-03 ENCOUNTER — Telehealth: Payer: Self-pay

## 2022-06-03 DIAGNOSIS — I5022 Chronic systolic (congestive) heart failure: Secondary | ICD-10-CM

## 2022-06-03 DIAGNOSIS — Z95810 Presence of automatic (implantable) cardiac defibrillator: Secondary | ICD-10-CM | POA: Diagnosis not present

## 2022-06-03 NOTE — Progress Notes (Signed)
EPIC Encounter for ICM Monitoring  Patient Name: [name] is a 66 y.o. female Date: 06/03/2022 Primary Care Physican: Berna Bue Primary Cardiologist: Johnsie Cancel Electrophysiologist: Lovena Le 02/05/2022 Weight: 175 lbs 02/11/2022 Weight: 168 lbs (discharge weight) 02/12/2022 Weight: 170.2 lbs 02/13/2022 Weight: 170.9 lbs 8/3/202 Weight: 170 lbs                                                          Attempted call to patient and unable to reach.  Transmission reviewed.    Optivol thoracic impedance suggesting possible fluid accumulation starting 9/3.       Prescribed: Furosemide 40 mg Take 1 tablet (40 mg total) by mouth daily as needed for fluid or edema (Anytime you have any of the following symptoms: 1) 3 pound weight gain in 24 hours or 5 pounds in 1 week 2) shortness of breath, with or without a dry hacking cough 3) swelling in the hands, feet or stomach 4) if you have to sleep on extra pillows at night in order to breathe.). Spironolactone 25 mg take 0.5 tablet (12.5 mg total)  by mouth daily.   Labs: 03/21/2022 Creatinine 0.67, BUN 6, Potassium 4.3, Sodium 142, GFR 98 02/09/2022 Creatinine 0.79, BUN 8, Potassium 3.5, Sodium 139  02/08/2022 Creatinine 0.79, BUN 7, Potassium 3.6, Sodium 140  02/07/2022 Creatinine 0.70, BUN 6, Potassium 3.1, Sodium 139  01/29/2022 Creatinine 0.82, BUN 7, Potassium 3.7, Sodium 139, GFR >60 12/02/2021 Creatinine 0.77, BUN 12, Potassium 4.4, Sodium 136, GFR >60  A complete set of results can be found in Results Review.   Recommendations:  Unable to reach.  Will recommend to take PRN Lasix if patient is reached.    Follow-up plan: ICM clinic phone appointment on 06/10/2022 to recheck fluid levels.   91 day device clinic remote transmission 06/14/2022.     EP/Cardiology Office Visits:  08/22/2022 with Dr Johnsie Cancel.   Recall 10/27/2021 with Dr Lovena Le, Tommye Standard PA or Lytle Michaels, [place].   Copy of ICM check sent to Dr. Lovena Le.  Will send to Dr  Johnsie Cancel for review if patient is reached.   3 month ICM trend: 06/03/2022.    12-14 Month ICM trend:     [name], RN 06/03/2022 3:14 PM

## 2022-06-03 NOTE — Telephone Encounter (Signed)
Remote ICM transmission received.  Attempted call to patient regarding ICM remote transmission and voice mail box is not set up.

## 2022-06-10 ENCOUNTER — Ambulatory Visit (INDEPENDENT_AMBULATORY_CARE_PROVIDER_SITE_OTHER): Payer: Medicare Other

## 2022-06-10 DIAGNOSIS — Z95810 Presence of automatic (implantable) cardiac defibrillator: Secondary | ICD-10-CM

## 2022-06-10 DIAGNOSIS — I5022 Chronic systolic (congestive) heart failure: Secondary | ICD-10-CM

## 2022-06-10 NOTE — Progress Notes (Signed)
EPIC Encounter for ICM Monitoring  Patient Name: [name] is a 65 y.o. female Date: 06/10/2022 Primary Care Physican: Berna Bue Primary Cardiologist: Johnsie Cancel Electrophysiologist: Lovena Le 02/05/2022 Weight: 175 lbs 02/11/2022 Weight: 168 lbs (discharge weight) 02/12/2022 Weight: 170.2 lbs 02/13/2022 Weight: 170.9 lbs 04/11/2022 Weight: 170 lbs 06/10/2022 Weight: 169.1 lbs                                                          Spoke with patient and heart failure questions reviewed.  Transmission results reviewed.  Pt asymptomatic for fluid accumulation.  Reports feeling well at this time and voices no complaints.     Optivol thoracic impedance suggesting possible fluid accumulation starting 9/3 but starting to trend toward baseline.       Prescribed: Furosemide 40 mg Take 1 tablet (40 mg total) by mouth daily as needed for fluid or edema (Anytime you have any of the following symptoms: 1) 3 pound weight gain in 24 hours or 5 pounds in 1 week 2) shortness of breath, with or without a dry hacking cough 3) swelling in the hands, feet or stomach 4) if you have to sleep on extra pillows at night in order to breathe.). Spironolactone 25 mg take 0.5 tablet (12.5 mg total)  by mouth daily.   Labs: 03/21/2022 Creatinine 0.67, BUN 6, Potassium 4.3, Sodium 142, GFR 98 02/09/2022 Creatinine 0.79, BUN 8, Potassium 3.5, Sodium 139  02/08/2022 Creatinine 0.79, BUN 7, Potassium 3.6, Sodium 140  02/07/2022 Creatinine 0.70, BUN 6, Potassium 3.1, Sodium 139  01/29/2022 Creatinine 0.82, BUN 7, Potassium 3.7, Sodium 139, GFR >60 12/02/2021 Creatinine 0.77, BUN 12, Potassium 4.4, Sodium 136, GFR >60  A complete set of results can be found in Results Review.   Recommendations:  Advised to take PRN Furosemide 1 tablet x 3 days and then return to PRN.    Follow-up plan: ICM clinic phone appointment on 06/14/2022 to recheck fluid levels.   91 day device clinic remote transmission 06/14/2022.      EP/Cardiology Office Visits:  08/22/2022 with Dr Johnsie Cancel.   Recall 10/27/2021 with Dr Lovena Le, Tommye Standard PA or Lytle Michaels, [place].   Copy of ICM check sent to Dr. Lovena Le.    3 month ICM trend: 06/10/2022.    12-14 Month ICM trend:     [name], RN 06/10/2022 9:28 AM

## 2022-06-14 ENCOUNTER — Telehealth: Payer: Self-pay

## 2022-06-14 ENCOUNTER — Ambulatory Visit (INDEPENDENT_AMBULATORY_CARE_PROVIDER_SITE_OTHER): Payer: Medicare Other

## 2022-06-14 DIAGNOSIS — I428 Other cardiomyopathies: Secondary | ICD-10-CM | POA: Diagnosis not present

## 2022-06-14 NOTE — Progress Notes (Signed)
EPIC Encounter for ICM Monitoring  Patient Name: [name] is a 66 y.o. female Date: 06/14/2022 Primary Care Physican: Berna Bue Primary Cardiologist: Johnsie Cancel Electrophysiologist: Lovena Le 02/05/2022 Weight: 175 lbs 02/11/2022 Weight: 168 lbs (discharge weight) 02/12/2022 Weight: 170.2 lbs 02/13/2022 Weight: 170.9 lbs 04/11/2022 Weight: 170 lbs 06/10/2022 Weight: 169.1 lbs                                                          Attempted call to patient and unable to reach.   Transmission reviewed.     Optivol thoracic impedance suggesting fluid levels returned to normal after recommendation to take PRN Furosemide 1 tablet x 3 days.       Prescribed: Furosemide 40 mg Take 1 tablet (40 mg total) by mouth daily as needed for fluid or edema (Anytime you have any of the following symptoms: 1) 3 pound weight gain in 24 hours or 5 pounds in 1 week 2) shortness of breath, with or without a dry hacking cough 3) swelling in the hands, feet or stomach 4) if you have to sleep on extra pillows at night in order to breathe.). Spironolactone 25 mg take 0.5 tablet (12.5 mg total)  by mouth daily.   Labs: 03/21/2022 Creatinine 0.67, BUN 6, Potassium 4.3, Sodium 142, GFR 98 02/09/2022 Creatinine 0.79, BUN 8, Potassium 3.5, Sodium 139  02/08/2022 Creatinine 0.79, BUN 7, Potassium 3.6, Sodium 140  02/07/2022 Creatinine 0.70, BUN 6, Potassium 3.1, Sodium 139  01/29/2022 Creatinine 0.82, BUN 7, Potassium 3.7, Sodium 139, GFR >60 12/02/2021 Creatinine 0.77, BUN 12, Potassium 4.4, Sodium 136, GFR >60  A complete set of results can be found in Results Review.   Recommendations:  Unable to reach.      Follow-up plan: ICM clinic phone appointment on 07/22/2022.   91 day device clinic remote transmission 1/5/202.     EP/Cardiology Office Visits:  08/22/2022 with Dr Johnsie Cancel.   Recall 10/27/2021 with Dr Lovena Le, Tommye Standard PA or Lytle Michaels, [place].   Copy of ICM check sent to Dr. Lovena Le.     3 month ICM  trend: 06/14/2022.    12-14 Month ICM trend:     [name], RN 06/14/2022 3:26 PM

## 2022-06-14 NOTE — Telephone Encounter (Signed)
Remote ICM transmission received.  Attempted call to patient regarding ICM remote transmission and no answer.  Voice mail box not set up.

## 2022-06-18 LAB — CUP PACEART REMOTE DEVICE CHECK
Battery Remaining Longevity: 24 mo
Battery Voltage: 2.93 V
Brady Statistic RV Percent Paced: 0.02 %
Date Time Interrogation Session: 20231006091413
HighPow Impedance: 93 Ohm
Implantable Lead Implant Date: 20140212
Implantable Lead Location: 753860
Implantable Lead Model: 6935
Implantable Pulse Generator Implant Date: 20140212
Lead Channel Impedance Value: 513 Ohm
Lead Channel Impedance Value: 589 Ohm
Lead Channel Pacing Threshold Amplitude: 0.625 V
Lead Channel Pacing Threshold Pulse Width: 0.4 ms
Lead Channel Sensing Intrinsic Amplitude: 9.25 mV
Lead Channel Sensing Intrinsic Amplitude: 9.25 mV
Lead Channel Setting Pacing Amplitude: 2.5 V
Lead Channel Setting Pacing Pulse Width: 0.4 ms
Lead Channel Setting Sensing Sensitivity: 0.3 mV

## 2022-06-18 NOTE — Progress Notes (Signed)
Remote ICD transmission.   

## 2022-07-22 ENCOUNTER — Ambulatory Visit (INDEPENDENT_AMBULATORY_CARE_PROVIDER_SITE_OTHER): Payer: Medicare Other

## 2022-07-22 DIAGNOSIS — I5022 Chronic systolic (congestive) heart failure: Secondary | ICD-10-CM | POA: Diagnosis not present

## 2022-07-22 DIAGNOSIS — Z95810 Presence of automatic (implantable) cardiac defibrillator: Secondary | ICD-10-CM | POA: Diagnosis not present

## 2022-07-26 ENCOUNTER — Telehealth: Payer: Self-pay

## 2022-07-26 NOTE — Telephone Encounter (Signed)
Remote ICM transmission received.  Attempted call to patient on home and cell phone regarding ICM remote transmission and both numbers out of service.

## 2022-07-26 NOTE — Progress Notes (Signed)
EPIC Encounter for ICM Monitoring  Patient Name: [name] is a 66 y.o. female Date: 07/26/2022 Primary Care Physican: Berna Bue Primary Cardiologist: Johnsie Cancel Electrophysiologist: Lovena Le 02/05/2022 Weight: 175 lbs 02/11/2022 Weight: 168 lbs (discharge weight) 02/12/2022 Weight: 170.2 lbs 02/13/2022 Weight: 170.9 lbs 04/11/2022 Weight: 170 lbs 06/10/2022 Weight: 169.1 lbs                                                          Attempted call to patient and unable to reach.   Transmission reviewed.    Optivol thoracic impedance suggesting normal fluid levels with the exception of possible fluid accumulation from 10/21-10/28.       Prescribed: Furosemide 40 mg Take 1 tablet (40 mg total) by mouth daily as needed for fluid or edema (Anytime you have any of the following symptoms: 1) 3 pound weight gain in 24 hours or 5 pounds in 1 week 2) shortness of breath, with or without a dry hacking cough 3) swelling in the hands, feet or stomach 4) if you have to sleep on extra pillows at night in order to breathe.). Spironolactone 25 mg take 0.5 tablet (12.5 mg total)  by mouth daily.   Labs: 03/21/2022 Creatinine 0.67, BUN 6, Potassium 4.3, Sodium 142, GFR 98 02/09/2022 Creatinine 0.79, BUN 8, Potassium 3.5, Sodium 139  02/08/2022 Creatinine 0.79, BUN 7, Potassium 3.6, Sodium 140  02/07/2022 Creatinine 0.70, BUN 6, Potassium 3.1, Sodium 139  01/29/2022 Creatinine 0.82, BUN 7, Potassium 3.7, Sodium 139, GFR >60 12/02/2021 Creatinine 0.77, BUN 12, Potassium 4.4, Sodium 136, GFR >60  A complete set of results can be found in Results Review.   Recommendations:  Unable to reach.     Follow-up plan: ICM clinic phone appointment on 08/26/2022.   91 day device clinic remote transmission 09/13/2022.     EP/Cardiology Office Visits:  08/22/2022 with Dr Johnsie Cancel.   Recall 10/27/2021 with Dr Lovena Le, Tommye Standard PA or Lytle Michaels, [place].   Copy of ICM check sent to Dr. Lovena Le.   3 month ICM trend:  07/22/2022.    12-14 Month ICM trend:     [name], RN 07/26/2022 3:48 PM

## 2022-07-30 ENCOUNTER — Telehealth: Payer: Self-pay

## 2022-07-30 NOTE — Telephone Encounter (Signed)
Pt called to update phone number.  New number is [phone].  This will be the only number for her.  Advised to update DPR with new phone number at 12/14 OV with Dr Johnsie Cancel.

## 2022-08-12 NOTE — Progress Notes (Signed)
We attempted video but she could not do at this time  Date:  08/22/2022   ID:  [name], DOB 02/22/1956, MRN [id number]   PCP:  Karen Neither, PA-C  Cardiologist:  Karen Rouge, MD  Electrophysiologist:  Karen Peru, MD   Evaluation Performed:  Follow-Up Visit  Chief Complaint:  CAD  History of Present Illness:     66 y.o. 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 Known PVD with bilateral SFA occlusions July 2019 has relapse with EF 20-25% cath with patent stent and mild dx elsewhere There seems to be and issue with ETOH abuse    Seen by pulmonary 06/08/18 Gold stage 2 and on Spiriva now  D/C from hospital 02/09/22 for volume overload Pedal edema and optivol elevated Diuresed and readings back to normal TTE 02/08/22 EF 40-45% NOrmal RV mild MR mild AR BNP was elevated at 1069 D/C K 3.5 and Cr 0.79 [name] venous US with no DVT Entresto resumed June 2023 and coreg dose increased   Sees [facility] for PVD right ABI 0.74 left 0.80 09/20/21   She complains of resting pain in her left leg primarily below ankle Denies calf pain , history of gout or Recent trauma Needs refills on CHF meds      Past Medical History:  Diagnosis Date   Acute on chronic systolic heart failure (HCC)    Acute respiratory failure (HCC)    Automatic implantable cardioverter-defibrillator in situ    CAD (coronary artery disease)    a. Inf-post MI 2008 s/p BMS to large marginal of Cx.    COPD (chronic obstructive pulmonary disease) ([name])    ETOH abuse    Hyperglycemia    HYPERLIPIDEMIA-MIXED 02/07/2009   HYPERTENSION, BENIGN 08/22/2009   PVD (peripheral vascular disease) ([name])    a. Evaluated by Dr. [name] Anon 08/2012.   SYSTOLIC HEART FAILURE, CHRONIC 02/07/2009   a. EF 30% 2010, 29% by MRI 08/2012. b. s/p prophylactic Medtronic ICD implantation 10/2012.   TIA (transient ischemic attack) 05/18/2014   Tobacco abuse    VENTRICULAR  TACHYCARDIA 05/21/2010   Past Surgical History:  Procedure Laterality Date   CARDIAC DEFIBRILLATOR PLACEMENT  10/2012   CESAREAN SECTION  1977; 1982   COLONOSCOPY     CYSTOSCOPY W/ STONE MANIPULATION  1990's   ESOPHAGOGASTRODUODENOSCOPY (EGD) WITH PROPOFOL N/A 08/04/2018   Procedure: ESOPHAGOGASTRODUODENOSCOPY (EGD) WITH PROPOFOL;  Surgeon: Gatha Mayer, MD;  Location: [facility];  Service: Endoscopy;  Laterality: N/A;   IMPLANTABLE CARDIOVERTER DEFIBRILLATOR IMPLANT N/A 10/21/2012   Procedure: IMPLANTABLE CARDIOVERTER DEFIBRILLATOR IMPLANT;  Surgeon: Evans Lance, MD;  Location: [facility] CATH LAB;  Service: Cardiovascular;  Laterality: N/A;   RIGHT/LEFT HEART CATH AND CORONARY ANGIOGRAPHY N/A 03/26/2018   Procedure: RIGHT/LEFT HEART CATH AND CORONARY ANGIOGRAPHY;  Surgeon: Nelva Bush, MD;  Location: [facility];  Service: Cardiovascular;  Laterality: N/A;     Current Meds  Medication Sig   albuterol (PROAIR HFA) 108 (90 Base) MCG/ACT inhaler Inhale 2 puffs into the lungs every 6 (six) hours as needed for wheezing or shortness of breath.   aspirin EC 81 MG tablet Take 81 mg by mouth daily.   carvedilol (COREG) 6.25 MG tablet Take 1 tablet (6.25 mg total) by mouth 2 (two) times daily with a meal.   feeding supplement, ENSURE COMPLETE, (ENSURE COMPLETE) LIQD Take 237 mLs by mouth 3 (three) times  daily between meals.   folic acid (FOLVITE) 1 MG tablet Take 1 tablet (1 mg total) by mouth daily.   furosemide (LASIX) 40 MG tablet Take 1 tablet (40 mg total) by mouth daily as needed for fluid or edema (Anytime you have any of the following symptoms: 1) 3 pound weight gain in 24 hours or 5 pounds in 1 week 2) shortness of breath, with or without a dry hacking cough 3) swelling in the hands, feet or stomach 4) if you have to sleep on extra pillows at night in order to breathe.).   levothyroxine (SYNTHROID) 25 MCG tablet Take 25 mcg by mouth daily.   Misc. Devices MISC Use scale daily to  monitor weight.   Multiple Vitamin (MULTIVITAMIN WITH MINERALS) TABS tablet Take 1 tablet by mouth daily.   nitroGLYCERIN (NITROSTAT) 0.4 MG SL tablet Place 1 tablet (0.4 mg total) under the tongue every 5 (five) minutes as needed for chest pain (up to 3 doses).   sacubitril-valsartan (ENTRESTO) 24-26 MG Take 1 tablet by mouth 2 (two) times daily.   spironolactone (ALDACTONE) 25 MG tablet Take 0.5 tablets (12.5 mg total) by mouth daily.   thiamine 100 MG tablet Take 1 tablet (100 mg total) by mouth daily.     Allergies:   Clopidogrel bisulfate   Social History   Tobacco Use   Smoking status: Former    Packs/day: 0.50    Years: 40.00    Total pack years: 20.00    Types: Cigarettes    Quit date: 04/27/2018    Years since quitting: 4.3   Smokeless tobacco: Never   Tobacco comments:    pt is on nicotine patches   Vaping Use   Vaping Use: Never used  Substance Use Topics   Alcohol use: Yes    Alcohol/week: 20.0 standard drinks of alcohol    Types: 20 Cans of beer per week    Comment: 05/18/2014 "2, 40's qod"; 1 beer last week    Drug use: No     Family Hx: The patient's family history includes Asthma in her brother; Cancer in her mother; Heart attack in her father; Other in her brother.  ROS:   Please see the history of present illness.    General:no colds or fevers, + weight loss, she put herself on a diet Skin:no rashes or ulcers HEENT:no blurred vision, no congestion CV:see HPI PUL:see HPI GI:no diarrhea constipation or melena, no indigestion GU:no hematuria, no dysuria MS:no joint pain, no claudication Neuro:no syncope, no lightheadedness Endo:no diabetes, no thyroid disease  All other systems reviewed and are negative.   Prior CV studies:   The following studies were reviewed today:  Echo 01/20/19 IMPRESSIONS      1. Severe hypokinesis of the left ventricular, basal-mid inferolateral wall, anterolateral wall and inferior wall.  2. The left ventricle has  mild-moderately reduced systolic function, with an ejection fraction of 40-45%. The cavity size was mildly dilated. Left ventricular diastolic Doppler parameters are consistent with impaired relaxation. Elevated mean left  atrial pressure.  3. The right ventricle has normal systolic function. The cavity was normal. There is no increase in right ventricular wall thickness.  4. The aortic valve is tricuspid. Aortic valve regurgitation is moderate by color flow Doppler.   FINDINGS  Left Ventricle: The left ventricle has mild-moderately reduced systolic function, with an ejection fraction of 40-45%. The cavity size was mildly dilated. There is no increase in left ventricular wall thickness. Left ventricular diastolic Doppler  parameters  are consistent with impaired relaxation. Elevated mean left atrial pressure Severe hypokinesis of the left ventricular, basal-mid inferolateral wall, anterolateral wall and inferior wall.   Right Ventricle: The right ventricle has normal systolic function. The cavity was normal. There is no increase in right ventricular wall thickness. Pacing wire/catheter visualized in the right ventricle.   Left Atrium: Left atrial size was normal in size.   Right Atrium: Right atrial size was normal in size. Right atrial pressure is estimated at 10 mmHg.   Interatrial Septum: No atrial level shunt detected by color flow Doppler.   Pericardium: There is no evidence of pericardial effusion.   Mitral Valve: The mitral valve is normal in structure. Mitral valve regurgitation is mild by color flow Doppler.   Tricuspid Valve: The tricuspid valve is normal in structure. Tricuspid valve regurgitation is trivial by color flow Doppler.   Aortic Valve: The aortic valve is tricuspid Aortic valve regurgitation is moderate by color flow Doppler.   Pulmonic Valve: The pulmonic valve was normal in structure. Pulmonic valve regurgitation is trivial by color flow Doppler.   Venous: The  inferior vena cava is normal in size with greater than 50% respiratory variability.       Labs/Other Tests and Data Reviewed:    EKG:  An ECG dated 01/03/19 was personally reviewed today and demonstrated:  SR with Q waves III, AVF, deep T wave V5-6  Recent Labs: 02/07/2022: B Natriuretic Peptide 766.2 02/09/2022: ALT 21; Hemoglobin 11.2; Magnesium 2.0; Platelets 114 03/21/2022: BUN 6; Creatinine, Ser 0.67; NT-Pro BNP 888; Potassium 4.3; Sodium 142   Recent Lipid Panel Lab Results  Component Value Date/Time   CHOL 130 03/23/2018 09:00 AM   CHOL 153 02/24/2017 11:54 AM   TRIG 66 03/23/2018 09:00 AM   HDL 39 (L) 03/23/2018 09:00 AM   HDL 74 02/24/2017 11:54 AM   CHOLHDL 3.3 03/23/2018 09:00 AM   LDLCALC 78 03/23/2018 09:00 AM   LDLCALC 67 02/24/2017 11:54 AM    Wt Readings from Last 3 Encounters:  08/22/22 166 lb 3.2 oz (75.4 kg)  02/21/22 170 lb (77.1 kg)  02/09/22 167 lb 8.8 oz (76 kg)     Objective:    Vital Signs:  BP 118/64   Pulse 70   Ht '5\' 9"'$  (1.753 m)   Wt 166 lb 3.2 oz (75.4 kg)   SpO2 93%   BMI 24.54 kg/m    Affect appropriate Chronically ill black female  HEENT: normal Neck supple with no adenopathy JVP normal no bruits no thyromegaly Lungs clear with no wheezing and good diaphragmatic motion Heart:  S1/S2 no murmur, no rub, gallop or click PMI increased AICD under left clavicle  Abdomen: benighn, BS positve, no tenderness, no AAA no bruit.  No HSM or HJR  bilateral pedal pulses absent No edema Neuro non-focal Skin warm and dry No muscular weakness   ASSESSMENT & PLAN:    NICM with EF to 25-30% on echo, now EF improved to 40-45% Continue lower dose coreg and Lasix/aldactone Monitor Optivol remotely entresto resumed June 2023 and coreg increased at that time to 6.25 mg bid as well  AICD:  No shocks normal function f/u [name] Medtronic device   CAD patent stent in OM no residual dx cath 03/26/18 continue medical Rx  HLD on statin labs with primary    HTN Well controlled.  Continue current medications and low sodium Dash type diet.    Tobacco use, has stopped  + COPD and followed by Pulmonary  CTA 02/07/22 with moderate emphysema diffuse bronchial thickening no cancer and no PE  Esophageal thickening: seen on 02/07/22 CT needs GI f/u and possible endoscopy Seen by Carlean Purl in 2019 and no issues felt thickening from contraction  PV:  ABI moderately reduced 09/20/21 0.74 on right and 0.80 on left  With known bilateral SFA occlusion since 2013 F/U Dr [name] Anon Now with rest pain in left foot Not clear that this is vascular pain Will repeat [name] arterial duplex and ABI's refer back to Dr [name] Anon    Medication Adjustments/Labs and Tests Ordered: Current medicines are reviewed at length with the patient today.  Concerns regarding medicines are outlined above.   Tests Ordered: No orders of the defined types were placed in this encounter.  ABI/[name] arterial duplex Medication Changes: No orders of the defined types were placed in this encounter.  None   Disposition: F/Y [facility] for PVD next available f/u general cards 6 months   Signed, Karen Rouge, MD  08/22/2022 10:40 AM    [place]

## 2022-08-22 ENCOUNTER — Encounter: Payer: Self-pay | Admitting: Cardiovascular Disease

## 2022-08-22 ENCOUNTER — Ambulatory Visit: Payer: Medicare Other | Attending: Cardiovascular Disease | Admitting: Cardiovascular Disease

## 2022-08-22 VITALS — BP 118/64 | HR 70 | Ht 69.0 in | Wt 166.2 lb

## 2022-08-22 DIAGNOSIS — I10 Essential (primary) hypertension: Secondary | ICD-10-CM

## 2022-08-22 DIAGNOSIS — I2510 Atherosclerotic heart disease of native coronary artery without angina pectoris: Secondary | ICD-10-CM

## 2022-08-22 DIAGNOSIS — Z95810 Presence of automatic (implantable) cardiac defibrillator: Secondary | ICD-10-CM

## 2022-08-22 DIAGNOSIS — E785 Hyperlipidemia, unspecified: Secondary | ICD-10-CM

## 2022-08-22 DIAGNOSIS — I739 Peripheral vascular disease, unspecified: Secondary | ICD-10-CM

## 2022-08-22 DIAGNOSIS — I428 Other cardiomyopathies: Secondary | ICD-10-CM

## 2022-08-22 MED ORDER — ENTRESTO 24-26 MG PO TABS: 1.0000 | ORAL_TABLET | Freq: Two times a day (BID) | ORAL | 11 refills | Status: DC

## 2022-08-22 MED ORDER — CARVEDILOL 6.25 MG PO TABS: 6.2500 mg | ORAL_TABLET | Freq: Two times a day (BID) | ORAL | 3 refills | Status: DC

## 2022-08-22 MED ORDER — SPIRONOLACTONE 25 MG PO TABS: 12.5000 mg | ORAL_TABLET | Freq: Every day | ORAL | 3 refills | Status: DC

## 2022-08-22 NOTE — Patient Instructions (Signed)
Medication Instructions:  Your physician recommends that you continue on your current medications as directed. Please refer to the Current Medication list given to you today.  *If you need a refill on your cardiac medications before your next appointment, please call your pharmacy*  Lab Work: If you have labs (blood work) drawn today and your tests are completely normal, you will receive your results only by: [place] (if you have MyChart) OR A paper copy in the mail If you have any lab test that is abnormal or we need to change your treatment, we will call you to review the results.  Testing/Procedures: Your physician has requested that you have an ankle brachial index (ABI). During this test an ultrasound and blood pressure cuff are used to evaluate the arteries that supply the arms and legs with blood. Allow thirty minutes for this exam. There are no restrictions or special instructions.  Your physician has requested that you have a lower extremity arterial duplex. This test is an ultrasound of the arteries in the legs. It looks at arterial blood flow in the legs. Allow one hour for Lower Arterial scans. There are no restrictions or special instructions  Follow-Up: At [facility], you and your health needs are our priority.  As part of our continuing mission to provide you with exceptional heart care, we have created designated Provider Care Teams.  These Care Teams include your primary Cardiologist (physician) and Advanced Practice Providers (APPs -  Physician Assistants and Nurse Practitioners) who all work together to provide you with the care you need, when you need it.  We recommend signing up for the patient portal called "MyChart".  Sign up information is provided on this After Visit Summary.  MyChart is used to connect with patients for Virtual Visits (Telemedicine).  Patients are able to view lab/test results, encounter notes, upcoming appointments, etc.  Non-urgent  messages can be sent to your provider as well.   To learn more about what you can do with MyChart, go to [email or web address].    Your next appointment:   ASAP  The format for your next appointment:   In Person  Provider:   Dr. [name] Anon    Other Instructions Your physician recommends that you schedule a follow-up appointment in: 1 year with Dr. Johnsie Cancel.   Important Information About Sugar

## 2022-08-26 ENCOUNTER — Ambulatory Visit (INDEPENDENT_AMBULATORY_CARE_PROVIDER_SITE_OTHER): Payer: Medicare Other

## 2022-08-26 DIAGNOSIS — Z95810 Presence of automatic (implantable) cardiac defibrillator: Secondary | ICD-10-CM | POA: Diagnosis not present

## 2022-08-26 DIAGNOSIS — I5022 Chronic systolic (congestive) heart failure: Secondary | ICD-10-CM

## 2022-08-27 NOTE — Progress Notes (Signed)
EPIC Encounter for ICM Monitoring  Patient Name: [name] is a 66 y.o. female Date: 08/27/2022 Primary Care Physican: Berna Bue Primary Cardiologist: Johnsie Cancel Electrophysiologist: Lovena Le 02/05/2022 Weight: 175 lbs 02/11/2022 Weight: 168 lbs (discharge weight) 02/12/2022 Weight: 170.2 lbs 02/13/2022 Weight: 170.9 lbs 04/11/2022 Weight: 170 lbs 06/10/2022 Weight: 169.1 lbs 08/27/2022 Weight: 165.3 lbs                                                          Spoke with patient and heart failure questions reviewed.  Transmission results reviewed.  Pt reports she took PRN Furosemide today because she felt like she needed one although she did not have any specific fluid symptoms.       Optivol thoracic impedance suggesting possible fluid accumulation starting 12/3.       Prescribed: Furosemide 40 mg Take 1 tablet (40 mg total) by mouth daily as needed for fluid or edema (Anytime you have any of the following symptoms: 1) 3 pound weight gain in 24 hours or 5 pounds in 1 week 2) shortness of breath, with or without a dry hacking cough 3) swelling in the hands, feet or stomach 4) if you have to sleep on extra pillows at night in order to breathe.). Spironolactone 25 mg take 0.5 tablet (12.5 mg total)  by mouth daily.   Labs: 03/21/2022 Creatinine 0.67, BUN 6, Potassium 4.3, Sodium 142, GFR 98 02/09/2022 Creatinine 0.79, BUN 8, Potassium 3.5, Sodium 139  02/08/2022 Creatinine 0.79, BUN 7, Potassium 3.6, Sodium 140  02/07/2022 Creatinine 0.70, BUN 6, Potassium 3.1, Sodium 139  01/29/2022 Creatinine 0.82, BUN 7, Potassium 3.7, Sodium 139, GFR >60 12/02/2021 Creatinine 0.77, BUN 12, Potassium 4.4, Sodium 136, GFR >60  A complete set of results can be found in Results Review.   Recommendations:  She will take PRN Furosemide 1 tablet x 2 days.    Follow-up plan: ICM clinic phone appointment on 09/03/2022 (manual) to recheck fluid levels.   91 day device clinic remote transmission  09/13/2022.     EP/Cardiology Office Visits:  Recall 08/18/2023 with Dr Johnsie Cancel.  09/17/2022 with Dr [name] Anon.   Recall 10/27/2021 with Dr Lovena Le, Tommye Standard PA or Lytle Michaels, [place].   Copy of ICM check sent to Dr. Lovena Le.   3 month ICM trend: 08/26/2022.    12-14 Month ICM trend:     [name], RN 08/27/2022 12:53 PM

## 2022-08-28 ENCOUNTER — Ambulatory Visit (HOSPITAL_COMMUNITY)
Admission: RE | Admit: 2022-08-28 | Discharge: 2022-08-28 | Disposition: A | Discharge status: Home / Self Care | Payer: Medicare Other | Source: Ambulatory Visit | Attending: Cardiovascular Disease | Admitting: Cardiovascular Disease

## 2022-08-28 DIAGNOSIS — I739 Peripheral vascular disease, unspecified: Secondary | ICD-10-CM | POA: Diagnosis not present

## 2022-08-28 DIAGNOSIS — E785 Hyperlipidemia, unspecified: Secondary | ICD-10-CM | POA: Diagnosis not present

## 2022-08-28 DIAGNOSIS — I428 Other cardiomyopathies: Secondary | ICD-10-CM | POA: Insufficient documentation

## 2022-08-28 DIAGNOSIS — I2510 Atherosclerotic heart disease of native coronary artery without angina pectoris: Secondary | ICD-10-CM | POA: Insufficient documentation

## 2022-08-28 DIAGNOSIS — Z95810 Presence of automatic (implantable) cardiac defibrillator: Secondary | ICD-10-CM | POA: Diagnosis present

## 2022-08-28 DIAGNOSIS — I10 Essential (primary) hypertension: Secondary | ICD-10-CM | POA: Diagnosis not present

## 2022-08-29 ENCOUNTER — Telehealth: Payer: Self-pay | Admitting: Cardiovascular Disease

## 2022-08-29 NOTE — Telephone Encounter (Signed)
Patient was returning call. Please advise ?

## 2022-08-29 NOTE — Telephone Encounter (Signed)
Tried to call patient back, unable to get through.

## 2022-09-03 ENCOUNTER — Ambulatory Visit (INDEPENDENT_AMBULATORY_CARE_PROVIDER_SITE_OTHER): Payer: Medicare Other

## 2022-09-03 DIAGNOSIS — Z95810 Presence of automatic (implantable) cardiac defibrillator: Secondary | ICD-10-CM

## 2022-09-03 DIAGNOSIS — I5022 Chronic systolic (congestive) heart failure: Secondary | ICD-10-CM

## 2022-09-05 NOTE — Telephone Encounter (Signed)
Patient aware of results.

## 2022-09-06 NOTE — Progress Notes (Signed)
EPIC Encounter for ICM Monitoring  Patient Name: [name] is a 66 y.o. female Date: 09/06/2022 Primary Care Physican: Berna Bue Primary Cardiologist: Johnsie Cancel Electrophysiologist: Lovena Le 02/05/2022 Weight: 175 lbs 02/11/2022 Weight: 168 lbs (discharge weight) 02/12/2022 Weight: 170.2 lbs 02/13/2022 Weight: 170.9 lbs 04/11/2022 Weight: 170 lbs 06/10/2022 Weight: 169.1 lbs 08/27/2022 Weight: 165.3 lbs 09/06/2022 Weight: 165.1 lbs                                                          Spoke with patient and heart failure questions reviewed.  Transmission results reviewed.  Pt asymptomatic for fluid accumulation.  Reports feeling well at this time and voices no complaints.   Pt will need refill on Furosemide at next office visit.    Optivol thoracic impedance suggesting fluid levels returned to normal after taking PRN Furosemide x 2 days.       Prescribed: Furosemide 40 mg Take 1 tablet (40 mg total) by mouth daily as needed for fluid or edema (Anytime you have any of the following symptoms: 1) 3 pound weight gain in 24 hours or 5 pounds in 1 week 2) shortness of breath, with or without a dry hacking cough 3) swelling in the hands, feet or stomach 4) if you have to sleep on extra pillows at night in order to breathe.). Spironolactone 25 mg take 0.5 tablet (12.5 mg total)  by mouth daily.   Labs: 03/21/2022 Creatinine 0.67, BUN 6, Potassium 4.3, Sodium 142, GFR 98 02/09/2022 Creatinine 0.79, BUN 8, Potassium 3.5, Sodium 139  02/08/2022 Creatinine 0.79, BUN 7, Potassium 3.6, Sodium 140  02/07/2022 Creatinine 0.70, BUN 6, Potassium 3.1, Sodium 139  01/29/2022 Creatinine 0.82, BUN 7, Potassium 3.7, Sodium 139, GFR >60 12/02/2021 Creatinine 0.77, BUN 12, Potassium 4.4, Sodium 136, GFR >60  A complete set of results can be found in Results Review.   Recommendations:  No changes and encouraged to call if experiencing any fluid symptoms.   Follow-up plan: ICM clinic phone  appointment on 10/07/2022.   91 day device clinic remote transmission 09/13/2022.     EP/Cardiology Office Visits:  Recall 08/18/2023 with Dr Johnsie Cancel.  09/17/2022 with Dr [name] Anon.  Provided EP scheduler number and advised to call for yearly appointment that is due in Feb.  Recall 10/27/2021 with Dr Lovena Le, Tommye Standard PA or Lytle Michaels, [place].   Copy of ICM check sent to Dr. Lovena Le.   3 month ICM trend: 09/03/2022.    12-14 Month ICM trend:     [name], RN 09/06/2022 2:46 PM

## 2022-09-13 ENCOUNTER — Ambulatory Visit (INDEPENDENT_AMBULATORY_CARE_PROVIDER_SITE_OTHER): Payer: Medicare Other

## 2022-09-13 DIAGNOSIS — I428 Other cardiomyopathies: Secondary | ICD-10-CM | POA: Diagnosis not present

## 2022-09-17 ENCOUNTER — Ambulatory Visit: Payer: 59 | Admitting: Cardiovascular Disease

## 2022-09-17 LAB — CUP PACEART REMOTE DEVICE CHECK
Battery Remaining Longevity: 19 mo
Battery Voltage: 2.92 V
Brady Statistic RV Percent Paced: 0.02 %
Date Time Interrogation Session: 20240106193828
HighPow Impedance: 75 Ohm
Implantable Lead Connection Status: 753985
Implantable Lead Implant Date: 20140212
Implantable Lead Location: 753860
Implantable Lead Model: 6935
Implantable Pulse Generator Implant Date: 20140212
Lead Channel Impedance Value: 456 Ohm
Lead Channel Impedance Value: 532 Ohm
Lead Channel Pacing Threshold Amplitude: 0.625 V
Lead Channel Pacing Threshold Pulse Width: 0.4 ms
Lead Channel Sensing Intrinsic Amplitude: 6.625 mV
Lead Channel Sensing Intrinsic Amplitude: 6.625 mV
Lead Channel Setting Pacing Amplitude: 2.5 V
Lead Channel Setting Pacing Pulse Width: 0.4 ms
Lead Channel Setting Sensing Sensitivity: 0.3 mV
Zone Setting Status: 755011

## 2022-09-30 NOTE — Progress Notes (Signed)
Remote ICD transmission.   

## 2022-10-08 ENCOUNTER — Encounter: Payer: Self-pay | Admitting: Cardiovascular Disease

## 2022-10-08 ENCOUNTER — Ambulatory Visit: Payer: 59 | Attending: Cardiovascular Disease | Admitting: Cardiovascular Disease

## 2022-10-08 VITALS — BP 108/60 | HR 80 | Ht 69.0 in | Wt 168.8 lb

## 2022-10-08 DIAGNOSIS — E785 Hyperlipidemia, unspecified: Secondary | ICD-10-CM

## 2022-10-08 DIAGNOSIS — I739 Peripheral vascular disease, unspecified: Secondary | ICD-10-CM | POA: Diagnosis not present

## 2022-10-08 DIAGNOSIS — I2510 Atherosclerotic heart disease of native coronary artery without angina pectoris: Secondary | ICD-10-CM | POA: Diagnosis not present

## 2022-10-08 DIAGNOSIS — I5022 Chronic systolic (congestive) heart failure: Secondary | ICD-10-CM

## 2022-10-08 DIAGNOSIS — I10 Essential (primary) hypertension: Secondary | ICD-10-CM

## 2022-10-08 NOTE — Patient Instructions (Signed)
Medication Instructions:  No changes *If you need a refill on your cardiac medications before your next appointment, please call your pharmacy*   Lab Work: None ordered If you have labs (blood work) drawn today and your tests are completely normal, you will receive your results only by: [place] (if you have MyChart) OR A paper copy in the mail If you have any lab test that is abnormal or we need to change your treatment, we will call you to review the results.   Testing/Procedures: None ordered   Follow-Up: At [facility], you and your health needs are our priority.  As part of our continuing mission to provide you with exceptional heart care, we have created designated Provider Care Teams.  These Care Teams include your primary Cardiologist (physician) and Advanced Practice Providers (APPs -  Physician Assistants and Nurse Practitioners) who all work together to provide you with the care you need, when you need it.  We recommend signing up for the patient portal called "MyChart".  Sign up information is provided on this After Visit Summary.  MyChart is used to connect with patients for Virtual Visits (Telemedicine).  Patients are able to view lab/test results, encounter notes, upcoming appointments, etc.  Non-urgent messages can be sent to your provider as well.   To learn more about what you can do with MyChart, go to [email or web address].    Your next appointment:   12 month(s)  Provider:   Dr. [name] Anon Other Instructions P[place] ARTERIAL DISEASE (PAD)   General Information:   Research in vascular exercise has demonstrated remarkable improvement in symptoms of leg pain (claudication) without expensive or invasive interventions. Regular walking programs are extremely helpful for patients with PAD and intermittent claudication.  These steps are designed to help you get started with a safe and effective program to help you  walk farther with less pain:   Walk at least three times a week (preferably every day).  Your goal is to build up to 30-45 minutes of total walking time (not counting rest breaks). It may take you several weeks to build up your exercise time starting at 5-10 minutes or whatever you can tolerate.  Walk as far as possible using moderate to maximal pain (7-8 on the scale below) as a signal to stop, and resume walking when the pain goes away.  On a treadmill, set the speed and grade at a level that brings on the claudication pain within 3 to 5 minutes. Walk at this rate until you experience claudication of moderate severity, rest until the pain improves, and then resume walking.  Over time, you will be able to walk longer at the designated speed and grade; workload should then be increased until you develop the pain within 3 to 5 minutes once again.  This regimen will induce a significant benefit. Studies have demonstrated that participants may be able to walk up to three or four times farther and have less leg pain, within twelve weeks, by following this protocol.  Pain Scale    0_____1_____2_____3_____4_____5_____6_____7_____8_____9_____10   No Pain                                   Moderate Pain                               Maximal Pain

## 2022-10-08 NOTE — Progress Notes (Signed)
Cardiology Office Note   Date:  10/08/2022   ID:  [name], DOB 01/20/1956, MRN [id number]  PCP:  Myrtie Neither, PA-C  Cardiologist:  Dr. Johnsie Cancel  No chief complaint on file.     History of Present Illness: [name] is a 67 y.o. female who is here today for follow-up visit regarding peripheral arterial disease.   She has known history of coronary artery disease, chronic systolic heart failure status post ICD placement, COPD, tobacco use, COPD, hyperlipidemia and previous ventricular tachycardia.  She was diagnosed with bilateral SFA occlusion in 2013.  She was seen by me at that time and given that her symptoms were not lifestyle limiting, I recommended continuing medical therapy and smoking cessation.  She struggled to quit smoking but ultimately was successful.  Most recent ABI was 0.77 on the right and 0.83 on the left.  She reported worsening left leg pain few months ago but the symptoms are overall atypical for claudication.  The pain was mostly located in the front below the knee and not involving the calfs.  She reports improvement in symptoms overall.  There is history of excessive alcohol use but she reports drinking beer every other day and moderate amounts.   Past Medical History:  Diagnosis Date   Acute on chronic systolic heart failure ([place])    Acute respiratory failure ([facility])    Automatic implantable cardioverter-defibrillator in situ    CAD (coronary artery disease)    a. Inf-post MI 2008 s/p BMS to large marginal of Cx.    COPD (chronic obstructive pulmonary disease) ([place])    ETOH abuse    Hyperglycemia    HYPERLIPIDEMIA-MIXED 02/07/2009   HYPERTENSION, BENIGN 08/22/2009   PVD (peripheral vascular disease) ([place])    a. Evaluated by Dr. [name] Anon 08/2012.   SYSTOLIC HEART FAILURE, CHRONIC 02/07/2009   a. EF 30% 2010, 29% by MRI 08/2012. b. s/p prophylactic Medtronic ICD implantation 10/2012.   TIA (transient ischemic attack) 05/18/2014   Tobacco abuse     VENTRICULAR TACHYCARDIA 05/21/2010    Past Surgical History:  Procedure Laterality Date   CARDIAC DEFIBRILLATOR PLACEMENT  10/2012   CESAREAN SECTION  1977; 1982   COLONOSCOPY     CYSTOSCOPY W/ STONE MANIPULATION  1990's   ESOPHAGOGASTRODUODENOSCOPY (EGD) WITH PROPOFOL N/A 08/04/2018   Procedure: ESOPHAGOGASTRODUODENOSCOPY (EGD) WITH PROPOFOL;  Surgeon: Gatha Mayer, MD;  Location: [facility] END[facility];  Service: Endoscopy;  Laterality: N/A;   IMPLANTABLE CARDIOVERTER DEFIBRILLATOR IMPLANT N/A 10/21/2012   Procedure: IMPLANTABLE CARDIOVERTER DEFIBRILLATOR IMPLANT;  Surgeon: Evans Lance, MD;  Location: [facility] CATH LAB;  Service: Cardiovascular;  Laterality: N/A;   RIGHT/LEFT HEART CATH AND CORONARY ANGIOGRAPHY N/A 03/26/2018   Procedure: RIGHT/LEFT HEART CATH AND CORONARY ANGIOGRAPHY;  Surgeon: Nelva Bush, MD;  Location: [facility] LAB;  Service: Cardiovascular;  Laterality: N/A;     Current Outpatient Medications  Medication Sig Dispense Refill   albuterol (PROAIR HFA) 108 (90 Base) MCG/ACT inhaler Inhale 2 puffs into the lungs every 6 (six) hours as needed for wheezing or shortness of breath. 18 g 5   aspirin EC 81 MG tablet Take 81 mg by mouth daily.     carvedilol (COREG) 6.25 MG tablet Take 1 tablet (6.25 mg total) by mouth 2 (two) times daily with a meal. 180 tablet 3   feeding supplement, ENSURE COMPLETE, (ENSURE COMPLETE) LIQD Take 237 mLs by mouth 3 (three) times daily between meals.     folic acid (FOLVITE) 1  MG tablet Take 1 tablet (1 mg total) by mouth daily. 30 tablet 0   furosemide (LASIX) 40 MG tablet Take 1 tablet (40 mg total) by mouth daily as needed for fluid or edema (Anytime you have any of the following symptoms: 1) 3 pound weight gain in 24 hours or 5 pounds in 1 week 2) shortness of breath, with or without a dry hacking cough 3) swelling in the hands, feet or stomach 4) if you have to sleep on extra pillows at night in order to breathe.). 30 tablet 1    levothyroxine (SYNTHROID) 25 MCG tablet Take 25 mcg by mouth daily.     Misc. Devices MISC Use scale daily to monitor weight.     Multiple Vitamin (MULTIVITAMIN WITH MINERALS) TABS tablet Take 1 tablet by mouth daily.     nitroGLYCERIN (NITROSTAT) 0.4 MG SL tablet Place 1 tablet (0.4 mg total) under the tongue every 5 (five) minutes as needed for chest pain (up to 3 doses). 25 tablet 4   sacubitril-valsartan (ENTRESTO) 24-26 MG Take 1 tablet by mouth 2 (two) times daily. 60 tablet 11   spironolactone (ALDACTONE) 25 MG tablet Take 0.5 tablets (12.5 mg total) by mouth daily. 45 tablet 3   thiamine 100 MG tablet Take 1 tablet (100 mg total) by mouth daily. 30 tablet 0   atorvastatin (LIPITOR) 40 MG tablet Take 1 tablet (40 mg total) by mouth daily. 90 tablet 3   No current facility-administered medications for this visit.    Allergies:   Clopidogrel bisulfate    Social History:  The patient  reports that she quit smoking about 4 years ago. Her smoking use included cigarettes. She has a 20.00 pack-year smoking history. She has never used smokeless tobacco. She reports current alcohol use of about 20.0 standard drinks of alcohol per week. She reports that she does not use drugs.   Family History:  The patient's family history includes Asthma in her brother; Cancer in her mother; Heart attack in her father; Other in her brother.    ROS:  Please see the history of present illness.   Otherwise, review of systems are positive for none.   All other systems are reviewed and negative.    PHYSICAL EXAM: VS:  BP 108/60   Pulse 80   Ht '5\' 9"'$  (1.753 m)   Wt 168 lb 12.8 oz (76.6 kg)   SpO2 99%   BMI 24.93 kg/m  , BMI Body mass index is 24.93 kg/m. GEN: Well nourished, well developed, in no acute distress  HEENT: normal  Neck: no JVD, carotid bruits, or masses Cardiac: RRR; no murmurs, rubs, or gallops,no edema  Respiratory:  clear to auscultation bilaterally, normal work of breathing GI: soft,  nontender, nondistended, + BS MS: no deformity or atrophy  Skin: warm and dry, no rash Neuro:  Strength and sensation are intact Psych: euthymic mood, full affect Distal pulses are not palpable.   EKG:  EKG is ordered today. The ekg ordered today demonstrates normal sinus rhythm with low voltage and nonspecific ST and T wave changes.   Recent Labs: 02/07/2022: B Natriuretic Peptide 766.2 02/09/2022: ALT 21; Hemoglobin 11.2; Magnesium 2.0; Platelets 114 03/21/2022: BUN 6; Creatinine, Ser 0.67; NT-Pro BNP 888; Potassium 4.3; Sodium 142    Lipid Panel    Component Value Date/Time   CHOL 130 03/23/2018 0900   CHOL 153 02/24/2017 1154   TRIG 66 03/23/2018 0900   HDL 39 (L) 03/23/2018 0900   HDL 74  02/24/2017 1154   CHOLHDL 3.3 03/23/2018 0900   VLDL 13 03/23/2018 0900   LDLCALC 78 03/23/2018 0900   LDLCALC 67 02/24/2017 1154      Wt Readings from Last 3 Encounters:  10/08/22 168 lb 12.8 oz (76.6 kg)  08/22/22 166 lb 3.2 oz (75.4 kg)  02/21/22 170 lb (77.1 kg)           No data to display            ASSESSMENT AND PLAN:  1.  Peripheral arterial disease: The patient has chronic bilateral SFA occlusion which has been present since at least 2013.   Recent left leg pain does not seem to be due to claudication or rest pain.  He reports improvement in symptoms anyway.  Recommend continuing medical therapy.  Given her known PAD and coronary artery disease, I considered adding low-dose Xarelto.  However, she is mildly anemic with chronic thrombocytopenia likely due to excessive alcohol use and thus her bleeding risk is higher than average.  Continue aspirin for now. I provided her with PAD exercise instructions.  2.  Chronic systolic heart failure: She appears to be euvolemic and currently on optimal medical therapy.  3.  Coronary artery disease involving native coronary arteries: She denies anginal symptoms.  4.  Hyperlipidemia: Continue treatment with atorvastatin with a  target LDL of less than 70.  5.  Essential hypertension: Blood pressures well controlled.  6.  Previous tobacco use: No relapse.    Disposition:   FU with me in 1 year  Signed,  Kathlyn Sacramento, MD  10/08/2022 8:58 AM    [place]

## 2022-10-09 NOTE — Progress Notes (Signed)
No ICM remote transmission received for 10/07/2022 and next ICM transmission scheduled for 10/21/2022.

## 2022-10-17 ENCOUNTER — Other Ambulatory Visit: Payer: Self-pay | Admitting: Cardiovascular Disease

## 2022-10-21 ENCOUNTER — Ambulatory Visit: Payer: 59

## 2022-10-24 NOTE — Progress Notes (Signed)
No ICM remote transmission received for 10/21/2022 and next ICM transmission scheduled for 11/11/2022.

## 2022-11-15 ENCOUNTER — Telehealth: Payer: Self-pay

## 2022-11-15 NOTE — Telephone Encounter (Signed)
Pt agreed to send missed ICM transmission. 

## 2022-11-19 ENCOUNTER — Ambulatory Visit: Payer: 59 | Attending: Internal Medicine

## 2022-11-19 ENCOUNTER — Telehealth: Payer: Self-pay

## 2022-11-19 DIAGNOSIS — I5022 Chronic systolic (congestive) heart failure: Secondary | ICD-10-CM

## 2022-11-19 DIAGNOSIS — Z95810 Presence of automatic (implantable) cardiac defibrillator: Secondary | ICD-10-CM | POA: Diagnosis not present

## 2022-11-19 NOTE — Telephone Encounter (Signed)
Remote ICM transmission received.  Attempted call to patient regarding ICM remote transmission and call did not go through

## 2022-11-19 NOTE — Progress Notes (Signed)
EPIC Encounter for ICM Monitoring  Patient Name: [name] is a 67 y.o. female Date: 11/19/2022 Primary Care Physican: Berna Bue Primary Cardiologist: Johnsie Cancel Electrophysiologist: Lovena Le 02/05/2022 Weight: 175 lbs 02/11/2022 Weight: 168 lbs (discharge weight) 02/12/2022 Weight: 170.2 lbs 02/13/2022 Weight: 170.9 lbs 04/11/2022 Weight: 170 lbs 06/10/2022 Weight: 169.1 lbs 08/27/2022 Weight: 165.3 lbs 09/06/2022 Weight: 165.1 lbs                                                          Attempted call to patient and unable to reach.   Transmission reviewed. t.    Optivol thoracic impedance suggesting  possible fluid accumulation starting 1/10 and returning to normal 2/23.       Prescribed: Furosemide 40 mg Take 1 tablet (40 mg total) by mouth daily as needed for fluid or edema (Anytime you have any of the following symptoms: 1) 3 pound weight gain in 24 hours or 5 pounds in 1 week 2) shortness of breath, with or without a dry hacking cough 3) swelling in the hands, feet or stomach 4) if you have to sleep on extra pillows at night in order to breathe.). Spironolactone 25 mg take 0.5 tablet (12.5 mg total)  by mouth daily.   Labs: 03/21/2022 Creatinine 0.67, BUN 6, Potassium 4.3, Sodium 142, GFR 98 02/09/2022 Creatinine 0.79, BUN 8, Potassium 3.5, Sodium 139  02/08/2022 Creatinine 0.79, BUN 7, Potassium 3.6, Sodium 140  02/07/2022 Creatinine 0.70, BUN 6, Potassium 3.1, Sodium 139  01/29/2022 Creatinine 0.82, BUN 7, Potassium 3.7, Sodium 139, GFR >60 12/02/2021 Creatinine 0.77, BUN 12, Potassium 4.4, Sodium 136, GFR >60  A complete set of results can be found in Results Review.   Recommendations:  Unable to reach.     Follow-up plan: ICM clinic phone appointment on 12/23/2022.   91 day device clinic remote transmission 4/52024.     EP/Cardiology Office Visits:  Recall 08/18/2023 with Dr Johnsie Cancel.  09/17/2022 with Dr [name] Anon.  Pt aware call office for overdue appt.  Recall 10/27/2021  with Dr Lovena Le, Tommye Standard PA or Lytle Michaels, [place].   Copy of ICM check sent to Dr. Lovena Le.   3 month ICM trend: 11/15/2022.    12-14 Month ICM trend:     [name], RN 11/19/2022 4:28 PM

## 2022-11-29 ENCOUNTER — Other Ambulatory Visit: Payer: Self-pay | Admitting: Nurse Practitioner

## 2022-11-29 DIAGNOSIS — Z780 Asymptomatic menopausal state: Secondary | ICD-10-CM

## 2022-12-12 ENCOUNTER — Telehealth: Payer: Self-pay | Admitting: Internal Medicine

## 2022-12-12 NOTE — Telephone Encounter (Signed)
Patient stated she was able to re-start her device and sent a manual transmission.  Patient wants to confirm transmission was received.

## 2022-12-12 NOTE — Telephone Encounter (Signed)
Pt is calling because she is having issue with transmission. Please advise.

## 2022-12-12 NOTE — Telephone Encounter (Signed)
Remote transmission received. Patient called and advised. Appreciative of call.

## 2022-12-12 NOTE — Telephone Encounter (Signed)
I spoke with the patient and Transmission received.

## 2022-12-13 ENCOUNTER — Ambulatory Visit (INDEPENDENT_AMBULATORY_CARE_PROVIDER_SITE_OTHER): Payer: 59

## 2022-12-13 DIAGNOSIS — I428 Other cardiomyopathies: Secondary | ICD-10-CM

## 2022-12-15 LAB — CUP PACEART REMOTE DEVICE CHECK
Battery Remaining Longevity: 18 mo
Battery Voltage: 2.92 V
Brady Statistic RV Percent Paced: 0.01 %
Date Time Interrogation Session: 20240404154602
HighPow Impedance: 86 Ohm
Implantable Lead Connection Status: 753985
Implantable Lead Implant Date: 20140212
Implantable Lead Location: 753860
Implantable Lead Model: 6935
Implantable Pulse Generator Implant Date: 20140212
Lead Channel Impedance Value: 513 Ohm
Lead Channel Impedance Value: 570 Ohm
Lead Channel Pacing Threshold Amplitude: 0.75 V
Lead Channel Pacing Threshold Pulse Width: 0.4 ms
Lead Channel Sensing Intrinsic Amplitude: 9.5 mV
Lead Channel Sensing Intrinsic Amplitude: 9.5 mV
Lead Channel Setting Pacing Amplitude: 2.5 V
Lead Channel Setting Pacing Pulse Width: 0.4 ms
Lead Channel Setting Sensing Sensitivity: 0.3 mV
Zone Setting Status: 755011

## 2022-12-16 ENCOUNTER — Ambulatory Visit: Payer: 59 | Attending: Internal Medicine

## 2022-12-16 DIAGNOSIS — Z95810 Presence of automatic (implantable) cardiac defibrillator: Secondary | ICD-10-CM

## 2022-12-16 DIAGNOSIS — I5022 Chronic systolic (congestive) heart failure: Secondary | ICD-10-CM | POA: Diagnosis not present

## 2022-12-18 NOTE — Progress Notes (Signed)
EPIC Encounter for ICM Monitoring  Patient Name: [name] is a 67 y.o. female Date: 12/18/2022 Primary Care Physican: Modesta Messing Primary Cardiologist: Eden Emms Electrophysiologist: Ladona Ridgel 02/05/2022 Weight: 175 lbs 02/11/2022 Weight: 168 lbs (discharge weight) 02/12/2022 Weight: 170.2 lbs 02/13/2022 Weight: 170.9 lbs 04/11/2022 Weight: 170 lbs 06/10/2022 Weight: 169.1 lbs 08/27/2022 Weight: 165.3 lbs 09/06/2022 Weight: 165.1 lbs 12/18/2022 Weight: 167.1 lbs                                                          Spoke with patient and heart failure questions reviewed.  Transmission results reviewed.  Pt asymptomatic for fluid accumulation.  Reports feeling well at this time and voices no complaints.     Optivol thoracic impedance suggesting normal fluid levels.       Prescribed: Furosemide 40 mg Take 1 tablet (40 mg total) by mouth daily as needed for fluid or edema (Anytime you have any of the following symptoms: 1) 3 pound weight gain in 24 hours or 5 pounds in 1 week 2) shortness of breath, with or without a dry hacking cough 3) swelling in the hands, feet or stomach 4) if you have to sleep on extra pillows at night in order to breathe.). Spironolactone 25 mg take 0.5 tablet (12.5 mg total)  by mouth daily.   Labs: 03/21/2022 Creatinine 0.67, BUN 6, Potassium 4.3, Sodium 142, GFR 98 02/09/2022 Creatinine 0.79, BUN 8, Potassium 3.5, Sodium 139  02/08/2022 Creatinine 0.79, BUN 7, Potassium 3.6, Sodium 140  02/07/2022 Creatinine 0.70, BUN 6, Potassium 3.1, Sodium 139  01/29/2022 Creatinine 0.82, BUN 7, Potassium 3.7, Sodium 139, GFR >60 12/02/2021 Creatinine 0.77, BUN 12, Potassium 4.4, Sodium 136, GFR >60  A complete set of results can be found in Results Review.   Recommendations:  No changes and encouraged to call if experiencing any fluid symptoms.   Follow-up plan: ICM clinic phone appointment on 01/20/2023.   91 day device clinic remote transmission 7/52024.      EP/Cardiology Office Visits:  Recall 08/18/2023 with Dr Eden Emms.  09/17/2022 with Dr Kirke Corin.  Pt aware call office for overdue appt.  Recall 10/27/2021 with Dr Ladona Ridgel, Francis Dowse PA or Hassell Done, [place].   Copy of ICM check sent to Dr. Ladona Ridgel.    3 month ICM trend: 12/13/2022.    12-14 Month ICM trend:     Karie Soda, RN 12/18/2022 4:45 PM

## 2023-01-15 NOTE — Progress Notes (Signed)
Remote ICD transmission.   

## 2023-01-20 ENCOUNTER — Ambulatory Visit: Payer: 59 | Attending: Internal Medicine

## 2023-01-20 DIAGNOSIS — Z95810 Presence of automatic (implantable) cardiac defibrillator: Secondary | ICD-10-CM | POA: Diagnosis not present

## 2023-01-20 DIAGNOSIS — I5022 Chronic systolic (congestive) heart failure: Secondary | ICD-10-CM | POA: Diagnosis not present

## 2023-01-21 NOTE — Progress Notes (Signed)
EPIC Encounter for ICM Monitoring  Patient Name: [name] is a 67 y.o. female Date: 01/21/2023 Primary Care Physican: Modesta Messing Primary Cardiologist: Eden Emms Electrophysiologist: Ladona Ridgel 02/05/2022 Weight: 175 lbs 02/11/2022 Weight: 168 lbs (discharge weight) 02/12/2022 Weight: 170.2 lbs 02/13/2022 Weight: 170.9 lbs 04/11/2022 Weight: 170 lbs 06/10/2022 Weight: 169.1 lbs 08/27/2022 Weight: 165.3 lbs 09/06/2022 Weight: 165.1 lbs 12/18/2022 Weight: 167.1 lbs 01/21/2023 Weight: 165.8 lbs                                                          Spoke with patient and heart failure questions reviewed.  Transmission results reviewed.  Pt asymptomatic for fluid accumulation.  Reports feeling well at this time and voices no complaints.     Optivol thoracic impedance suggesting possible fluid accumulation since 5/5.       Prescribed: Furosemide 40 mg Take 1 tablet (40 mg total) by mouth daily as needed for fluid or edema (Anytime you have any of the following symptoms: 1) 3 pound weight gain in 24 hours or 5 pounds in 1 week 2) shortness of breath, with or without a dry hacking cough 3) swelling in the hands, feet or stomach 4) if you have to sleep on extra pillows at night in order to breathe.). Spironolactone 25 mg take 0.5 tablet (12.5 mg total)  by mouth daily.   Labs: 03/21/2022 Creatinine 0.67, BUN 6, Potassium 4.3, Sodium 142, GFR 98 02/09/2022 Creatinine 0.79, BUN 8, Potassium 3.5, Sodium 139  02/08/2022 Creatinine 0.79, BUN 7, Potassium 3.6, Sodium 140  02/07/2022 Creatinine 0.70, BUN 6, Potassium 3.1, Sodium 139  01/29/2022 Creatinine 0.82, BUN 7, Potassium 3.7, Sodium 139, GFR >60 12/02/2021 Creatinine 0.77, BUN 12, Potassium 4.4, Sodium 136, GFR >60  A complete set of results can be found in Results Review.   Recommendations:  Advised to take Furosemide 1 tablet x 2 days only and then return to as needed.    Follow-up plan: ICM clinic phone appointment on 01/24/2023 to  recheck fluid levels.   91 day device clinic remote transmission 7/52024.     EP/Cardiology Office Visits:  Recall 08/18/2023 with Dr Eden Emms.  09/17/2022 with Dr Kirke Corin.  Pt aware call office for overdue appt.  Recall 10/27/2021 with Dr Ladona Ridgel, Francis Dowse PA or Hassell Done, [place].   Copy of ICM check sent to Dr. Ladona Ridgel.   3 month ICM trend: 01/21/2023.    12-14 Month ICM trend:     Karie Soda, RN 01/21/2023 2:47 PM

## 2023-01-24 ENCOUNTER — Ambulatory Visit: Payer: 59 | Attending: Internal Medicine

## 2023-01-24 DIAGNOSIS — I5022 Chronic systolic (congestive) heart failure: Secondary | ICD-10-CM

## 2023-01-24 DIAGNOSIS — Z95810 Presence of automatic (implantable) cardiac defibrillator: Secondary | ICD-10-CM

## 2023-01-24 NOTE — Progress Notes (Signed)
EPIC Encounter for ICM Monitoring  Patient Name: [name] is a 67 y.o. female Date: 01/24/2023 Primary Care Physican: Modesta Messing Primary Cardiologist: Eden Emms Electrophysiologist: Ladona Ridgel 08/27/2022 Weight: 165.3 lbs 09/06/2022 Weight: 165.1 lbs 12/18/2022 Weight: 167.1 lbs 01/21/2023 Weight: 165.8 lbs                                                          Spoke with patient and heart failure questions reviewed.  Transmission results reviewed.  Pt asymptomatic for fluid accumulation.  Reports feeling well at this time and voices no complaints.     Optivol thoracic impedance suggesting fluid levels returned to normal after taking PRN Furosemide x 2 days.       Prescribed: Furosemide 40 mg Take 1 tablet (40 mg total) by mouth daily as needed for fluid or edema (Anytime you have any of the following symptoms: 1) 3 pound weight gain in 24 hours or 5 pounds in 1 week 2) shortness of breath, with or without a dry hacking cough 3) swelling in the hands, feet or stomach 4) if you have to sleep on extra pillows at night in order to breathe.). Spironolactone 25 mg take 0.5 tablet (12.5 mg total)  by mouth daily.   Labs: 03/21/2022 Creatinine 0.67, BUN 6, Potassium 4.3, Sodium 142, GFR 98 02/09/2022 Creatinine 0.79, BUN 8, Potassium 3.5, Sodium 139  02/08/2022 Creatinine 0.79, BUN 7, Potassium 3.6, Sodium 140  02/07/2022 Creatinine 0.70, BUN 6, Potassium 3.1, Sodium 139  01/29/2022 Creatinine 0.82, BUN 7, Potassium 3.7, Sodium 139, GFR >60 12/02/2021 Creatinine 0.77, BUN 12, Potassium 4.4, Sodium 136, GFR >60  A complete set of results can be found in Results Review.   Recommendations:  No changes and encouraged to call if experiencing any fluid symptoms.   Follow-up plan: ICM clinic phone appointment on 02/24/2023.   91 day device clinic remote transmission 03/14/2023.     EP/Cardiology Office Visits:  Recall 08/18/2023 with Dr Eden Emms.  09/17/2022 with Dr Kirke Corin.  Pt aware call  office for overdue appt.  Recall 10/27/2021 with Dr Ladona Ridgel, Francis Dowse PA or Hassell Done, [place].   Copy of ICM check sent to Dr. Ladona Ridgel.   3 month ICM trend: 01/24/2023.    12-14 Month ICM trend:     Karie Soda, RN 01/24/2023 4:32 PM

## 2023-02-18 ENCOUNTER — Telehealth: Payer: Self-pay

## 2023-02-18 NOTE — Telephone Encounter (Signed)
Returned ICM Call as requested by voice mail message.  Patient asked date of next ICM remote transmission and advised on Monday, 6/17.  She appreciated the call back.

## 2023-02-24 ENCOUNTER — Ambulatory Visit: Payer: 59 | Attending: Internal Medicine

## 2023-02-24 DIAGNOSIS — I5022 Chronic systolic (congestive) heart failure: Secondary | ICD-10-CM | POA: Diagnosis not present

## 2023-02-24 DIAGNOSIS — Z95810 Presence of automatic (implantable) cardiac defibrillator: Secondary | ICD-10-CM | POA: Diagnosis not present

## 2023-02-26 NOTE — Progress Notes (Signed)
EPIC Encounter for ICM Monitoring  Patient Name: [name] is a 67 y.o. female Date: 02/26/2023 Primary Care Physican: Modesta Messing Primary Cardiologist: Eden Emms Electrophysiologist: Ladona Ridgel 08/27/2022 Weight: 165.3 lbs 09/06/2022 Weight: 165.1 lbs 12/18/2022 Weight: 167.1 lbs 01/21/2023 Weight: 165.8 lbs 02/26/2023 Weight: 165.8 lbs                                                          Spoke with patient and heart failure questions reviewed.  Transmission results reviewed.  Pt asymptomatic for fluid accumulation.  Reports feeling well at this time and voices no complaints.     Optivol thoracic impedance suggesting normal fluid levels.       Prescribed: Furosemide 40 mg Take 1 tablet (40 mg total) by mouth daily as needed for fluid or edema (Anytime you have any of the following symptoms: 1) 3 pound weight gain in 24 hours or 5 pounds in 1 week 2) shortness of breath, with or without a dry hacking cough 3) swelling in the hands, feet or stomach 4) if you have to sleep on extra pillows at night in order to breathe.). Spironolactone 25 mg take 0.5 tablet (12.5 mg total)  by mouth daily.   Labs: 03/21/2022 Creatinine 0.67, BUN 6, Potassium 4.3, Sodium 142, GFR 98 02/09/2022 Creatinine 0.79, BUN 8, Potassium 3.5, Sodium 139  02/08/2022 Creatinine 0.79, BUN 7, Potassium 3.6, Sodium 140  02/07/2022 Creatinine 0.70, BUN 6, Potassium 3.1, Sodium 139  01/29/2022 Creatinine 0.82, BUN 7, Potassium 3.7, Sodium 139, GFR >60 12/02/2021 Creatinine 0.77, BUN 12, Potassium 4.4, Sodium 136, GFR >60  A complete set of results can be found in Results Review.   Recommendations:  No changes and encouraged to call if experiencing any fluid symptoms.   Follow-up plan: ICM clinic phone appointment on 03/31/2023.   91 day device clinic remote transmission 03/14/2023.     EP/Cardiology Office Visits:  Recall 08/18/2023 with Dr Eden Emms.  09/17/2022 with Dr Kirke Corin.  Pt aware call office for overdue appt.   Recall 10/27/2021 with Dr Ladona Ridgel, Francis Dowse PA or Hassell Done, [place].   Copy of ICM check sent to Dr. Ladona Ridgel.    3 month ICM trend: 02/24/2023.    12-14 Month ICM trend:     Karie Soda, RN 02/26/2023 3:08 PM

## 2023-03-14 ENCOUNTER — Ambulatory Visit (INDEPENDENT_AMBULATORY_CARE_PROVIDER_SITE_OTHER): Payer: 59

## 2023-03-14 DIAGNOSIS — I428 Other cardiomyopathies: Secondary | ICD-10-CM

## 2023-03-15 LAB — CUP PACEART REMOTE DEVICE CHECK
Battery Remaining Longevity: 15 mo
Battery Remaining Longevity: 15 mo
Battery Voltage: 2.9 V
Battery Voltage: 2.9 V
Brady Statistic RV Percent Paced: 0.04 %
Brady Statistic RV Percent Paced: 0.32 %
Date Time Interrogation Session: 20240705043729
Date Time Interrogation Session: 20240705152251
HighPow Impedance: 72 Ohm
HighPow Impedance: 81 Ohm
Implantable Lead Connection Status: 753985
Implantable Lead Connection Status: 753985
Implantable Lead Implant Date: 20140212
Implantable Lead Implant Date: 20140212
Implantable Lead Location: 753860
Implantable Lead Location: 753860
Implantable Lead Model: 6935
Implantable Lead Model: 6935
Implantable Pulse Generator Implant Date: 20140212
Implantable Pulse Generator Implant Date: 20140212
Lead Channel Impedance Value: 437 Ohm
Lead Channel Impedance Value: 494 Ohm
Lead Channel Impedance Value: 456 Ohm
Lead Channel Impedance Value: 532 Ohm
Lead Channel Pacing Threshold Amplitude: 0.625 V
Lead Channel Pacing Threshold Amplitude: 0.625 V
Lead Channel Pacing Threshold Pulse Width: 0.4 ms
Lead Channel Pacing Threshold Pulse Width: 0.4 ms
Lead Channel Sensing Intrinsic Amplitude: 6.375 mV
Lead Channel Sensing Intrinsic Amplitude: 6.375 mV
Lead Channel Sensing Intrinsic Amplitude: 6.375 mV
Lead Channel Sensing Intrinsic Amplitude: 6.375 mV
Lead Channel Setting Pacing Amplitude: 2.5 V
Lead Channel Setting Pacing Amplitude: 2.5 V
Lead Channel Setting Pacing Pulse Width: 0.4 ms
Lead Channel Setting Pacing Pulse Width: 0.4 ms
Lead Channel Setting Sensing Sensitivity: 0.3 mV
Lead Channel Setting Sensing Sensitivity: 0.3 mV
Zone Setting Status: 755011
Zone Setting Status: 755011

## 2023-03-17 ENCOUNTER — Telehealth: Payer: Self-pay

## 2023-03-17 NOTE — Telephone Encounter (Signed)
Returned call to patient as requested by voice mail message.  She stated she has a temporary phone number to add and to remove current number of [phone].  Temporary phone number until she gets a replacement is [phone].

## 2023-03-19 ENCOUNTER — Telehealth: Payer: Self-pay

## 2023-03-19 NOTE — Telephone Encounter (Signed)
Patient called to report her new phone number which is [phone] which will be her permanent number.

## 2023-03-31 ENCOUNTER — Ambulatory Visit: Payer: 59 | Attending: Internal Medicine

## 2023-03-31 DIAGNOSIS — I5022 Chronic systolic (congestive) heart failure: Secondary | ICD-10-CM | POA: Diagnosis not present

## 2023-03-31 DIAGNOSIS — Z95810 Presence of automatic (implantable) cardiac defibrillator: Secondary | ICD-10-CM | POA: Diagnosis not present

## 2023-04-02 NOTE — Progress Notes (Signed)
EPIC Encounter for ICM Monitoring  Patient Name: [name] is a 67 y.o. female Date: 04/02/2023 Primary Care Physican: Modesta Messing Primary Cardiologist: Eden Emms Electrophysiologist: Ladona Ridgel 08/27/2022 Weight: 165.3 lbs 09/06/2022 Weight: 165.1 lbs 12/18/2022 Weight: 167.1 lbs 01/21/2023 Weight: 165.8 lbs 02/26/2023 Weight: 165.8 lbs 04/02/2023 Weight: 166.4 lbs                                                          Spoke with patient and heart failure questions reviewed.  Transmission results reviewed.  Pt asymptomatic for fluid accumulation.  Reports feeling well at this time and voices no complaints.  She did take Furosemide on 7/23 due to she felt like she had some fluid.     Optivol thoracic impedance suggesting intermittent days with possible fluid accumulation within last month.       Prescribed: Furosemide 40 mg Take 1 tablet (40 mg total) by mouth daily as needed for fluid or edema (Anytime you have any of the following symptoms: 1) 3 pound weight gain in 24 hours or 5 pounds in 1 week 2) shortness of breath, with or without a dry hacking cough 3) swelling in the hands, feet or stomach 4) if you have to sleep on extra pillows at night in order to breathe.). Spironolactone 25 mg take 0.5 tablet (12.5 mg total)  by mouth daily.   Labs: 03/21/2022 Creatinine 0.67, BUN 6, Potassium 4.3, Sodium 142, GFR 98 02/09/2022 Creatinine 0.79, BUN 8, Potassium 3.5, Sodium 139  02/08/2022 Creatinine 0.79, BUN 7, Potassium 3.6, Sodium 140  02/07/2022 Creatinine 0.70, BUN 6, Potassium 3.1, Sodium 139  01/29/2022 Creatinine 0.82, BUN 7, Potassium 3.7, Sodium 139, GFR >60 12/02/2021 Creatinine 0.77, BUN 12, Potassium 4.4, Sodium 136, GFR >60  A complete set of results can be found in Results Review.   Recommendations:  No changes and encouraged to call if experiencing any fluid symptoms.   Follow-up plan: ICM clinic phone appointment on 05/05/2023.   91 day device clinic remote  transmission 06/16/2023.     EP/Cardiology Office Visits:  Recall 08/18/2023 with Dr Eden Emms.  09/17/2022 with Dr Kirke Corin.  Pt aware call office for overdue appt.  Recall 10/27/2021 with Dr Ladona Ridgel, Francis Dowse PA or Hassell Done, [place].   Copy of ICM check sent to Dr. Ladona Ridgel.   3 month ICM trend: 03/31/2023.    12-14 Month ICM trend:     Karie Soda, RN 04/02/2023 1:33 PM

## 2023-04-02 NOTE — Progress Notes (Signed)
Remote ICD transmission.   

## 2023-05-05 ENCOUNTER — Ambulatory Visit: Payer: 59

## 2023-05-05 DIAGNOSIS — Z95810 Presence of automatic (implantable) cardiac defibrillator: Secondary | ICD-10-CM

## 2023-05-05 DIAGNOSIS — I5022 Chronic systolic (congestive) heart failure: Secondary | ICD-10-CM | POA: Diagnosis not present

## 2023-05-08 NOTE — Progress Notes (Signed)
EPIC Encounter for ICM Monitoring  Patient Name: [name] is a 67 y.o. female Date: 05/08/2023 Primary Care Physican: Modesta Messing Primary Cardiologist: Eden Emms Electrophysiologist: Ladona Ridgel 08/27/2022 Weight: 165.3 lbs 09/06/2022 Weight: 165.1 lbs 12/18/2022 Weight: 167.1 lbs 01/21/2023 Weight: 165.8 lbs 02/26/2023 Weight: 165.8 lbs 04/02/2023 Weight: 166.4 lbs 05/08/2023 Weight: 163.8 lbs                                                          Spoke with patient and heart failure questions reviewed.  Transmission results reviewed.  Pt asymptomatic for fluid accumulation.  Reports feeling well at this time and voices no complaints.     Optivol thoracic impedance suggesting intermittent days with possible fluid accumulation within last month.       Prescribed: Furosemide 40 mg Take 1 tablet (40 mg total) by mouth daily as needed for fluid or edema (Anytime you have any of the following symptoms: 1) 3 pound weight gain in 24 hours or 5 pounds in 1 week 2) shortness of breath, with or without a dry hacking cough 3) swelling in the hands, feet or stomach 4) if you have to sleep on extra pillows at night in order to breathe.). Spironolactone 25 mg take 0.5 tablet (12.5 mg total)  by mouth daily.   Labs: 03/21/2022 Creatinine 0.67, BUN 6, Potassium 4.3, Sodium 142, GFR 98 02/09/2022 Creatinine 0.79, BUN 8, Potassium 3.5, Sodium 139  02/08/2022 Creatinine 0.79, BUN 7, Potassium 3.6, Sodium 140  02/07/2022 Creatinine 0.70, BUN 6, Potassium 3.1, Sodium 139  01/29/2022 Creatinine 0.82, BUN 7, Potassium 3.7, Sodium 139, GFR >60 12/02/2021 Creatinine 0.77, BUN 12, Potassium 4.4, Sodium 136, GFR >60  A complete set of results can be found in Results Review.   Recommendations:  No changes and encouraged to call if experiencing any fluid symptoms.   Follow-up plan: ICM clinic phone appointment on 06/17/2023.   91 day device clinic remote transmission 06/16/2023.     EP/Cardiology  Office Visits:  Recall 08/18/2023 with Dr Eden Emms.  09/17/2022 with Dr Kirke Corin.   9/13/20243 with Dr Ladona Ridgel.   Copy of ICM check sent to Dr. Ladona Ridgel.   3 month ICM trend: 05/06/2023.    12-14 Month ICM trend:     Karie Soda, RN 05/08/2023 3:38 PM

## 2023-05-13 ENCOUNTER — Other Ambulatory Visit: Payer: Self-pay | Admitting: Cardiovascular Disease

## 2023-05-23 ENCOUNTER — Ambulatory Visit: Payer: 59 | Attending: Internal Medicine | Admitting: Internal Medicine

## 2023-05-23 ENCOUNTER — Encounter: Payer: Self-pay | Admitting: Internal Medicine

## 2023-05-23 VITALS — BP 140/76 | HR 70 | Ht 69.0 in | Wt 166.4 lb

## 2023-05-23 DIAGNOSIS — I5022 Chronic systolic (congestive) heart failure: Secondary | ICD-10-CM | POA: Diagnosis not present

## 2023-05-23 LAB — CUP PACEART INCLINIC DEVICE CHECK
Date Time Interrogation Session: 20240913165735
Implantable Lead Connection Status: 753985
Implantable Lead Implant Date: 20140212
Implantable Lead Location: 753860
Implantable Lead Model: 6935
Implantable Pulse Generator Implant Date: 20140212

## 2023-05-23 NOTE — Patient Instructions (Addendum)
Medication Instructions:  Your physician recommends that you continue on your current medications as directed. Please refer to the Current Medication list given to you today.  *If you need a refill on your cardiac medications before your next appointment, please call your pharmacy*  Lab Work: None ordered.  If you have labs (blood work) drawn today and your tests are completely normal, you will receive your results only by: MyChart Message (if you have MyChart) OR A paper copy in the mail If you have any lab test that is abnormal or we need to change your treatment, we will call you to review the results.  Testing/Procedures: None ordered.  Follow-Up: At [facility], you and your health needs are our priority.  As part of our continuing mission to provide you with exceptional heart care, we have created designated Provider Care Teams.  These Care Teams include your primary Cardiologist (physician) and Advanced Practice Providers (APPs -  Physician Assistants and Nurse Practitioners) who all work together to provide you with the care you need, when you need it.   Your next appointment:   1 year(s)  The format for your next appointment:   In Person  Provider:   Lewayne Bunting, MD{or one of the following Advanced Practice Providers on your designated Care Team:   Francis Dowse, [place] Casimiro Needle "Mardelle Matte" [place], [place] Earnest Rosier, NP  Important Information About Sugar

## 2023-05-23 NOTE — Progress Notes (Signed)
HPI  Allergies  Allergen Reactions   Clopidogrel Bisulfate Rash     Current Outpatient Medications  Medication Sig Dispense Refill   albuterol (PROAIR HFA) 108 (90 Base) MCG/ACT inhaler Inhale 2 puffs into the lungs every 6 (six) hours as needed for wheezing or shortness of breath. 18 g 5   aspirin EC 81 MG tablet Take 81 mg by mouth daily.     atorvastatin (LIPITOR) 40 MG tablet TAKE 1 TABLET BY MOUTH EVERY DAY 90 tablet 1   carvedilol (COREG) 6.25 MG tablet Take 1 tablet (6.25 mg total) by mouth 2 (two) times daily with a meal. 180 tablet 3   feeding supplement, ENSURE COMPLETE, (ENSURE COMPLETE) LIQD Take 237 mLs by mouth 3 (three) times daily between meals.     folic acid (FOLVITE) 1 MG tablet Take 1 tablet (1 mg total) by mouth daily. 30 tablet 0   levothyroxine (SYNTHROID) 25 MCG tablet Take 25 mcg by mouth daily.     Misc. Devices MISC Use scale daily to monitor weight.     Multiple Vitamin (MULTIVITAMIN WITH MINERALS) TABS tablet Take 1 tablet by mouth daily.     nitroGLYCERIN (NITROSTAT) 0.4 MG SL tablet Place 1 tablet (0.4 mg total) under the tongue every 5 (five) minutes as needed for chest pain (up to 3 doses). 25 tablet 4   sacubitril-valsartan (ENTRESTO) 24-26 MG Take 1 tablet by mouth 2 (two) times daily. 60 tablet 11   spironolactone (ALDACTONE) 25 MG tablet Take 0.5 tablets (12.5 mg total) by mouth daily. 45 tablet 3   thiamine 100 MG tablet Take 1 tablet (100 mg total) by mouth daily. 30 tablet 0   furosemide (LASIX) 40 MG tablet Take 1 tablet (40 mg total) by mouth daily as needed for fluid or edema (Anytime you have any of the following symptoms: 1) 3 pound weight gain in 24 hours or 5 pounds in 1 week 2) shortness of breath, with or without a dry hacking cough 3) swelling in the hands, feet or stomach 4) if you have to sleep on extra pillows at night in order to breathe.). 30 tablet 1   No current facility-administered medications for this visit.     Past  Medical History:  Diagnosis Date   Acute on chronic systolic heart failure (HCC)    Acute respiratory failure (HCC)    Automatic implantable cardioverter-defibrillator in situ    CAD (coronary artery disease)    a. Inf-post MI 2008 s/p BMS to large marginal of Cx.    COPD (chronic obstructive pulmonary disease) (HCC)    ETOH abuse    Hyperglycemia    HYPERLIPIDEMIA-MIXED 02/07/2009   HYPERTENSION, BENIGN 08/22/2009   PVD (peripheral vascular disease) (HCC)    a. Evaluated by Dr. Kirke Corin 08/2012.   SYSTOLIC HEART FAILURE, CHRONIC 02/07/2009   a. EF 30% 2010, 29% by MRI 08/2012. b. s/p prophylactic Medtronic ICD implantation 10/2012.   TIA (transient ischemic attack) 05/18/2014   Tobacco abuse    VENTRICULAR TACHYCARDIA 05/21/2010    ROS:   All systems reviewed and negative except as noted in the HPI.   Past Surgical History:  Procedure Laterality Date   CARDIAC DEFIBRILLATOR PLACEMENT  10/2012   CESAREAN SECTION  1977; 1982   COLONOSCOPY     CYSTOSCOPY W/ STONE MANIPULATION  1990's   ESOPHAGOGASTRODUODENOSCOPY (EGD) WITH PROPOFOL N/A 08/04/2018   Procedure: ESOPHAGOGASTRODUODENOSCOPY (EGD) WITH PROPOFOL;  Surgeon: Iva Boop, MD;  Location: [facility] ENDOSCOPY;  Service: Endoscopy;  Laterality: N/A;   IMPLANTABLE CARDIOVERTER DEFIBRILLATOR IMPLANT N/A 10/21/2012   Procedure: IMPLANTABLE CARDIOVERTER DEFIBRILLATOR IMPLANT;  Surgeon: Marinus Maw, MD;  Location: [facility] CATH LAB;  Service: Cardiovascular;  Laterality: N/A;   RIGHT/LEFT HEART CATH AND CORONARY ANGIOGRAPHY N/A 03/26/2018   Procedure: RIGHT/LEFT HEART CATH AND CORONARY ANGIOGRAPHY;  Surgeon: Yvonne Kendall, MD;  Location: [facility] INVASIVE CV LAB;  Service: Cardiovascular;  Laterality: N/A;     Family History  Problem Relation Age of Onset   Cancer Mother        cervical   Heart attack Father    Asthma Brother    Other Brother        Pacemaker     Social History   Socioeconomic History   Marital status: Single    Spouse  name: Not on file   Number of children: Not on file   Years of education: Not on file   Highest education level: Not on file  Occupational History   Not on file  Tobacco Use   Smoking status: Former    Current packs/day: 0.00    Average packs/day: 0.5 packs/day for 40.0 years (20.0 ttl pk-yrs)    Types: Cigarettes    Start date: 04/27/1978    Quit date: 04/27/2018    Years since quitting: 5.0   Smokeless tobacco: Never   Tobacco comments:    pt is on nicotine patches   Vaping Use   Vaping status: Never Used  Substance and Sexual Activity   Alcohol use: Yes    Alcohol/week: 20.0 standard drinks of alcohol    Types: 20 Cans of beer per week    Comment: 05/18/2014 "2, 40's qod"; 1 beer last week    Drug use: No   Sexual activity: Yes  Other Topics Concern   Not on file  Social History Narrative   Single - disabled - last job at [employer]   + alcohol no smoking no substances   Social Determinants of Health   Financial Resource Strain: Low Risk  (10/17/2022)   Received from [facility], [facility]   Overall Financial Resource Strain (CARDIA)    Difficulty of Paying Living Expenses: Not hard at all  Food Insecurity: No Food Insecurity (10/17/2022)   Received from [facility], [facility]   Hunger Vital Sign    Worried About Running Out of Food in the Last Year: Never true    Ran Out of Food in the Last Year: Never true  Transportation Needs: No Transportation Needs (10/17/2022)   [facility] from [facility], [facility]   PRAPARE - Transportation    Lack of Transportation (Medical): No    Lack of Transportation (Non-Medical): No  Physical Activity: Sufficiently Active (10/30/2021)   Received from [facility], [facility]   Exercise Vital Sign    Days of Exercise per Week: 7 days    Minutes of Exercise per Session: 30 min  Stress: No Stress Concern Present (10/30/2021)   Received from [facility] - Occupational Stress Questionnaire    Feeling of Stress : Not at all  Social Connections: Unknown (01/07/2022)   Received from [facility], [facility]   Social Network    Social Network: Not on file  Intimate Partner Violence: Unknown (12/12/2021)   Received from [facility], [facility]   HITS    Physically Hurt: Not on file    Insult or Talk Down To: Not  on file    Threaten Physical Harm: Not on file    Scream or Curse: Not on file     BP (!) 140/76   Pulse 70   Ht 5\' 9"  (1.753 m)   Wt 166 lb 6.4 oz (75.5 kg)   SpO2 99%   BMI 24.57 kg/m   Physical Exam:  Well appearing NAD HEENT: Unremarkable Neck:  No JVD, no thyromegally Lymphatics:  No adenopathy Back:  No CVA tenderness Lungs:  Clear HEART:  Regular rate rhythm, no murmurs, no rubs, no clicks Abd:  soft, positive bowel sounds, no organomegally, no rebound, no guarding Ext:  2 plus pulses, no edema, no cyanosis, no clubbing Skin:  No rashes no nodules Neuro:  CN II through XII intact, motor grossly intact  EKG  DEVICE  Normal device function.  See PaceArt for details.   Assess/Plan:

## 2023-06-03 ENCOUNTER — Telehealth: Payer: Self-pay | Admitting: Cardiovascular Disease

## 2023-06-03 NOTE — Telephone Encounter (Signed)
Spoke with Pt. Pt would like copy of medication list sent to her house. Verified address. Copy of list to be mailed 06/03/23.

## 2023-06-03 NOTE — Telephone Encounter (Signed)
Patient is requesting call back to see if medication list and instructions can be mailed to her. Please advise.

## 2023-06-12 ENCOUNTER — Ambulatory Visit
Admission: RE | Admit: 2023-06-12 | Discharge: 2023-06-12 | Disposition: A | Discharge status: Home / Self Care | Payer: 59 | Source: Ambulatory Visit | Attending: Nurse Practitioner | Admitting: Nurse Practitioner

## 2023-06-12 DIAGNOSIS — Z780 Asymptomatic menopausal state: Secondary | ICD-10-CM

## 2023-06-16 ENCOUNTER — Ambulatory Visit (INDEPENDENT_AMBULATORY_CARE_PROVIDER_SITE_OTHER): Payer: 59

## 2023-06-16 DIAGNOSIS — I5022 Chronic systolic (congestive) heart failure: Secondary | ICD-10-CM

## 2023-06-16 DIAGNOSIS — I428 Other cardiomyopathies: Secondary | ICD-10-CM

## 2023-06-17 ENCOUNTER — Ambulatory Visit: Payer: 59 | Attending: Internal Medicine

## 2023-06-17 DIAGNOSIS — Z95810 Presence of automatic (implantable) cardiac defibrillator: Secondary | ICD-10-CM | POA: Diagnosis not present

## 2023-06-17 DIAGNOSIS — I5022 Chronic systolic (congestive) heart failure: Secondary | ICD-10-CM

## 2023-06-17 LAB — CUP PACEART REMOTE DEVICE CHECK
Battery Remaining Longevity: 12 mo
Battery Voltage: 2.9 V
Brady Statistic RV Percent Paced: 0.03 %
Date Time Interrogation Session: 20241007100912
HighPow Impedance: 77 Ohm
Implantable Lead Connection Status: 753985
Implantable Lead Implant Date: 20140212
Implantable Lead Location: 753860
Implantable Lead Model: 6935
Implantable Pulse Generator Implant Date: 20140212
Lead Channel Impedance Value: 437 Ohm
Lead Channel Impedance Value: 456 Ohm
Lead Channel Pacing Threshold Amplitude: 0.625 V
Lead Channel Pacing Threshold Pulse Width: 0.4 ms
Lead Channel Sensing Intrinsic Amplitude: 6.875 mV
Lead Channel Sensing Intrinsic Amplitude: 6.875 mV
Lead Channel Setting Pacing Amplitude: 2.5 V
Lead Channel Setting Pacing Pulse Width: 0.4 ms
Lead Channel Setting Sensing Sensitivity: 0.3 mV
Zone Setting Status: 755011

## 2023-06-24 NOTE — Progress Notes (Signed)
EPIC Encounter for ICM Monitoring  Patient Name: [name] is a 67 y.o. female Date: 06/24/2023 Primary Care Physican: Estevan Oaks, NP Primary Cardiologist: Eden Emms Electrophysiologist: Ladona Ridgel 08/27/2022 Weight: 165.3 lbs 09/06/2022 Weight: 165.1 lbs 12/18/2022 Weight: 167.1 lbs 01/21/2023 Weight: 165.8 lbs 02/26/2023 Weight: 165.8 lbs 04/02/2023 Weight: 166.4 lbs 05/08/2023 Weight: 163.8 lbs 06/24/2023 Weight: 166 lbs                                                          Spoke with patient and heart failure questions reviewed.  Transmission results reviewed.  Pt asymptomatic for fluid accumulation.  She reports taking PRN Lasix during decreased impedance.  Encouraged to monitor for fluid symptoms such as weight gain or swelling in lower legs and take PRN Furosemide.  She reports she gets dizzy when she has fluid.     Optivol thoracic impedance suggesting possible fluid accumulation 9/14-9/29.       Prescribed: Furosemide 40 mg Take 1 tablet (40 mg total) by mouth daily as needed for fluid or edema (Anytime you have any of the following symptoms: 1) 3 pound weight gain in 24 hours or 5 pounds in 1 week 2) shortness of breath, with or without a dry hacking cough 3) swelling in the hands, feet or stomach 4) if you have to sleep on extra pillows at night in order to breathe.). Spironolactone 25 mg take 0.5 tablet (12.5 mg total)  by mouth daily.   Labs: 03/21/2022 Creatinine 0.67, BUN 6, Potassium 4.3, Sodium 142, GFR 98 02/09/2022 Creatinine 0.79, BUN 8, Potassium 3.5, Sodium 139  02/08/2022 Creatinine 0.79, BUN 7, Potassium 3.6, Sodium 140  02/07/2022 Creatinine 0.70, BUN 6, Potassium 3.1, Sodium 139  01/29/2022 Creatinine 0.82, BUN 7, Potassium 3.7, Sodium 139, GFR >60 12/02/2021 Creatinine 0.77, BUN 12, Potassium 4.4, Sodium 136, GFR >60  A complete set of results can be found in Results Review.   Recommendations:  No changes and encouraged to call if experiencing any  fluid symptoms.   Follow-up plan: ICM clinic phone appointment on 07/21/2023.   91 day device clinic remote transmission 09/16/2023.     EP/Cardiology Office Visits:  09/26/2023 with Dr Eden Emms.  10/21/2023 with Dr Kirke Corin.      Copy of ICM check sent to Dr. Ladona Ridgel.   3 month ICM trend: 06/16/2023.    12-14 Month ICM trend:     Karie Soda, RN 06/24/2023 4:39 PM

## 2023-07-01 NOTE — Progress Notes (Signed)
Remote ICD transmission.   

## 2023-07-21 ENCOUNTER — Ambulatory Visit: Payer: 59 | Attending: Internal Medicine

## 2023-07-21 DIAGNOSIS — Z95810 Presence of automatic (implantable) cardiac defibrillator: Secondary | ICD-10-CM

## 2023-07-21 DIAGNOSIS — I5022 Chronic systolic (congestive) heart failure: Secondary | ICD-10-CM

## 2023-07-25 NOTE — Progress Notes (Signed)
EPIC Encounter for ICM Monitoring  Patient Name: [name] is a 67 y.o. female Date: 07/25/2023 Primary Care Physican: Estevan Oaks, NP Primary Cardiologist: Eden Emms Electrophysiologist: Ladona Ridgel 08/27/2022 Weight: 165.3 lbs 09/06/2022 Weight: 165.1 lbs 12/18/2022 Weight: 167.1 lbs 01/21/2023 Weight: 165.8 lbs 02/26/2023 Weight: 165.8 lbs 04/02/2023 Weight: 166.4 lbs 05/08/2023 Weight: 163.8 lbs 06/24/2023 Weight: 166 lbs                                                          Spoke with patient and heart failure questions reviewed.  Transmission results reviewed.  Pt asymptomatic for fluid accumulation.  She reports taking PRN Lasix 07/24/2023.   Encouraged to monitor for fluid symptoms such as weight gain or swelling in lower legs and take PRN Furosemide.  She reports she gets dizzy when she has fluid.     Optivol thoracic impedance suggesting possible fluid accumulation 10/21-11/6.       Prescribed: Furosemide 40 mg Take 1 tablet (40 mg total) by mouth daily as needed for fluid or edema (Anytime you have any of the following symptoms: 1) 3 pound weight gain in 24 hours or 5 pounds in 1 week 2) shortness of breath, with or without a dry hacking cough 3) swelling in the hands, feet or stomach 4) if you have to sleep on extra pillows at night in order to breathe.). Spironolactone 25 mg take 0.5 tablet (12.5 mg total)  by mouth daily.   Labs: 03/21/2022 Creatinine 0.67, BUN 6, Potassium 4.3, Sodium 142, GFR 98 02/09/2022 Creatinine 0.79, BUN 8, Potassium 3.5, Sodium 139  02/08/2022 Creatinine 0.79, BUN 7, Potassium 3.6, Sodium 140  02/07/2022 Creatinine 0.70, BUN 6, Potassium 3.1, Sodium 139  01/29/2022 Creatinine 0.82, BUN 7, Potassium 3.7, Sodium 139, GFR >60 12/02/2021 Creatinine 0.77, BUN 12, Potassium 4.4, Sodium 136, GFR >60  A complete set of results can be found in Results Review.   Recommendations:  No changes and encouraged to call if experiencing any fluid  symptoms.   Follow-up plan: ICM clinic phone appointment on 09/08/2023.   91 day device clinic remote transmission 09/16/2023.     EP/Cardiology Office Visits:  09/26/2023 with Dr Eden Emms.  10/21/2023 with Dr Kirke Corin.      Copy of ICM check sent to Dr. Ladona Ridgel.   3 month ICM trend: 07/21/2023.    12-14 Month ICM trend:     Karie Soda, RN 07/25/2023 4:19 PM

## 2023-09-02 ENCOUNTER — Other Ambulatory Visit: Payer: Self-pay | Admitting: Cardiovascular Disease

## 2023-09-08 ENCOUNTER — Ambulatory Visit: Payer: 59 | Attending: Internal Medicine

## 2023-09-08 DIAGNOSIS — I5022 Chronic systolic (congestive) heart failure: Secondary | ICD-10-CM | POA: Diagnosis not present

## 2023-09-08 DIAGNOSIS — Z95810 Presence of automatic (implantable) cardiac defibrillator: Secondary | ICD-10-CM | POA: Diagnosis not present

## 2023-09-11 ENCOUNTER — Other Ambulatory Visit: Payer: Self-pay | Admitting: Cardiovascular Disease

## 2023-09-12 NOTE — Progress Notes (Signed)
We attempted video but she could not do at this time  Date:  09/26/2023   ID:  [name], DOB March 17, 1956, MRN [id number]   PCP:  Estevan Oaks, NP  Cardiologist:  Charlton Haws, MD  Electrophysiologist:  Lewayne Bunting, MD   Evaluation Performed:  Follow-Up Visit  Chief Complaint:  CAD  History of Present Illness:     68 y.o. 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 Known PVD with bilateral SFA occlusions July 2019 has relapse with EF 20-25% cath with patent stent and mild dx elsewhere There seems to be and issue with ETOH abuse    Seen by pulmonary 06/08/18 Gold stage 2 and on Spiriva now  D/C from hospital 02/09/22 for volume overload Pedal edema and optivol elevated Diuresed and readings back to normal TTE 02/08/22 EF 40-45% NOrmal RV mild MR mild AR BNP was elevated at 1069 D/C K 3.5 and Cr 0.79 LE venous US with no DVT Entresto resumed June 2023 and coreg dose increased   Sees Arida for PVD  ABI's 0.77 on right and 0.83 on left 08/30/22   No cardiac complains quit smoking 4 years ago. Roots for [employer] but boyfriend Willie roots for [employer]    Past Medical History:  Diagnosis Date   Acute on chronic systolic heart failure (HCC)    Acute respiratory failure (HCC)    Automatic implantable cardioverter-defibrillator in situ    CAD (coronary artery disease)    a. Inf-post MI 2008 s/p BMS to large marginal of Cx.    COPD (chronic obstructive pulmonary disease) (HCC)    ETOH abuse    Hyperglycemia    HYPERLIPIDEMIA-MIXED 02/07/2009   HYPERTENSION, BENIGN 08/22/2009   PVD (peripheral vascular disease) (HCC)    a. Evaluated by Dr. Kirke Corin 08/2012.   SYSTOLIC HEART FAILURE, CHRONIC 02/07/2009   a. EF 30% 2010, 29% by MRI 08/2012. b. s/p prophylactic Medtronic ICD implantation 10/2012.   TIA (transient ischemic attack) 05/18/2014   Tobacco abuse    VENTRICULAR TACHYCARDIA 05/21/2010   Past  Surgical History:  Procedure Laterality Date   CARDIAC DEFIBRILLATOR PLACEMENT  10/2012   CESAREAN SECTION  1977; 1982   COLONOSCOPY     CYSTOSCOPY W/ STONE MANIPULATION  1990's   ESOPHAGOGASTRODUODENOSCOPY (EGD) WITH PROPOFOL N/A 08/04/2018   Procedure: ESOPHAGOGASTRODUODENOSCOPY (EGD) WITH PROPOFOL;  Surgeon: Iva Boop, MD;  Location: [facility];  Service: Endoscopy;  Laterality: N/A;   IMPLANTABLE CARDIOVERTER DEFIBRILLATOR IMPLANT N/A 10/21/2012   Procedure: IMPLANTABLE CARDIOVERTER DEFIBRILLATOR IMPLANT;  Surgeon: Marinus Maw, MD;  Location: [facility] CATH LAB;  Service: Cardiovascular;  Laterality: N/A;   RIGHT/LEFT HEART CATH AND CORONARY ANGIOGRAPHY N/A 03/26/2018   Procedure: RIGHT/LEFT HEART CATH AND CORONARY ANGIOGRAPHY;  Surgeon: Yvonne Kendall, MD;  Location: [facility] INVASIVE CV LAB;  Service: Cardiovascular;  Laterality: N/A;     Current Meds  Medication Sig   albuterol (PROAIR HFA) 108 (90 Base) MCG/ACT inhaler Inhale 2 puffs into the lungs every 6 (six) hours as needed for wheezing or shortness of breath.   alendronate (FOSAMAX) 70 MG tablet Take 70 mg by mouth once a week.   aspirin EC 81 MG tablet Take 81 mg by mouth daily.   atorvastatin (LIPITOR) 40 MG tablet TAKE 1 TABLET BY MOUTH EVERY DAY   carvedilol (COREG) 6.25 MG tablet Take 1 tablet (6.25 mg total) by mouth 2 (two) times  daily with a meal.   feeding supplement, ENSURE COMPLETE, (ENSURE COMPLETE) LIQD Take 237 mLs by mouth 3 (three) times daily between meals.   folic acid (FOLVITE) 1 MG tablet Take 1 tablet (1 mg total) by mouth daily.   furosemide (LASIX) 40 MG tablet TAKE 1 TABLET (40 MG TOTAL) BY MOUTH DAILY AS NEEDED FOR FLUID OR EDEMA (ANYTIME YOU HAVE ANY OF THE FOLLOWING SYMPTOMS: 1) 3 POUND WEIGHT GAIN IN 24 HOURS OR 5 POUNDS IN 1 WEEK 2) SHORTNESS OF BREATH, WITH OR WITHOUT A DRY HACKING COUGH 3) SWELLING IN THE HANDS, FEET OR STOMACH 4) IF YOU HAVE TO SLEEP ON EXTRA PILLOWS AT NIGHT IN ORDER TO BREATHE.).    levothyroxine (SYNTHROID) 75 MCG tablet Take 75 mcg by mouth every morning.   Misc. Devices MISC Use scale daily to monitor weight.   Multiple Vitamin (MULTIVITAMIN WITH MINERALS) TABS tablet Take 1 tablet by mouth daily.   nitroGLYCERIN (NITROSTAT) 0.4 MG SL tablet Place 1 tablet (0.4 mg total) under the tongue every 5 (five) minutes as needed for chest pain (up to 3 doses).   sacubitril-valsartan (ENTRESTO) 24-26 MG TAKE 1 TABLET BY MOUTH TWICE A DAY   spironolactone (ALDACTONE) 25 MG tablet Take 0.5 tablets (12.5 mg total) by mouth daily.   thiamine 100 MG tablet Take 1 tablet (100 mg total) by mouth daily.     Allergies:   Clopidogrel bisulfate   Social History   Tobacco Use   Smoking status: Former    Current packs/day: 0.00    Average packs/day: 0.5 packs/day for 40.0 years (20.0 ttl pk-yrs)    Types: Cigarettes    Start date: 04/27/1978    Quit date: 04/27/2018    Years since quitting: 5.4   Smokeless tobacco: Never   Tobacco comments:    pt is on nicotine patches   Vaping Use   Vaping status: Never Used  Substance Use Topics   Alcohol use: Yes    Alcohol/week: 20.0 standard drinks of alcohol    Types: 20 Cans of beer per week    Comment: 05/18/2014 "2, 40's qod"; 1 beer last week    Drug use: No     Family Hx: The patient's family history includes Asthma in her brother; Cancer in her mother; Heart attack in her father; Other in her brother.  ROS:   Please see the history of present illness.    General:no colds or fevers, + weight loss, she put herself on a diet Skin:no rashes or ulcers HEENT:no blurred vision, no congestion CV:see HPI PUL:see HPI GI:no diarrhea constipation or melena, no indigestion GU:no hematuria, no dysuria MS:no joint pain, no claudication Neuro:no syncope, no lightheadedness Endo:no diabetes, no thyroid disease  All other systems reviewed and are negative.   Prior CV studies:   The following studies were reviewed today:  Echo  01/20/19 IMPRESSIONS      1. Severe hypokinesis of the left ventricular, basal-mid inferolateral wall, anterolateral wall and inferior wall.  2. The left ventricle has mild-moderately reduced systolic function, with an ejection fraction of 40-45%. The cavity size was mildly dilated. Left ventricular diastolic Doppler parameters are consistent with impaired relaxation. Elevated mean left  atrial pressure.  3. The right ventricle has normal systolic function. The cavity was normal. There is no increase in right ventricular wall thickness.  4. The aortic valve is tricuspid. Aortic valve regurgitation is moderate by color flow Doppler.   FINDINGS  Left Ventricle: The left ventricle has mild-moderately reduced  systolic function, with an ejection fraction of 40-45%. The cavity size was mildly dilated. There is no increase in left ventricular wall thickness. Left ventricular diastolic Doppler  parameters are consistent with impaired relaxation. Elevated mean left atrial pressure Severe hypokinesis of the left ventricular, basal-mid inferolateral wall, anterolateral wall and inferior wall.   Right Ventricle: The right ventricle has normal systolic function. The cavity was normal. There is no increase in right ventricular wall thickness. Pacing wire/catheter visualized in the right ventricle.   Left Atrium: Left atrial size was normal in size.   Right Atrium: Right atrial size was normal in size. Right atrial pressure is estimated at 10 mmHg.   Interatrial Septum: No atrial level shunt detected by color flow Doppler.   Pericardium: There is no evidence of pericardial effusion.   Mitral Valve: The mitral valve is normal in structure. Mitral valve regurgitation is mild by color flow Doppler.   Tricuspid Valve: The tricuspid valve is normal in structure. Tricuspid valve regurgitation is trivial by color flow Doppler.   Aortic Valve: The aortic valve is tricuspid Aortic valve regurgitation is moderate  by color flow Doppler.   Pulmonic Valve: The pulmonic valve was normal in structure. Pulmonic valve regurgitation is trivial by color flow Doppler.   Venous: The inferior vena cava is normal in size with greater than 50% respiratory variability.       Labs/Other Tests and Data Reviewed:    EKG:  An ECG dated 01/03/19 was personally reviewed today and demonstrated:  SR with Q waves III, AVF, deep T wave V5-6  Recent Labs: No results found for requested labs within last 365 days.   Recent Lipid Panel Lab Results  Component Value Date/Time   CHOL 130 03/23/2018 09:00 AM   CHOL 153 02/24/2017 11:54 AM   TRIG 66 03/23/2018 09:00 AM   HDL 39 (L) 03/23/2018 09:00 AM   HDL 74 02/24/2017 11:54 AM   CHOLHDL 3.3 03/23/2018 09:00 AM   LDLCALC 78 03/23/2018 09:00 AM   LDLCALC 67 02/24/2017 11:54 AM    Wt Readings from Last 3 Encounters:  05/23/23 166 lb 6.4 oz (75.5 kg)  10/08/22 168 lb 12.8 oz (76.6 kg)  08/22/22 166 lb 3.2 oz (75.4 kg)     Objective:    Vital Signs:  BP 132/70   Pulse 74   Ht 5\' 9"  (1.753 m)   SpO2 98%   BMI 24.57 kg/m    Affect appropriate Chronically ill black female  HEENT: normal Neck supple with no adenopathy JVP normal no bruits no thyromegaly Lungs clear with no wheezing and good diaphragmatic motion Heart:  S1/S2 no murmur, no rub, gallop or click PMI increased AICD under left clavicle  Abdomen: benighn, BS positve, no tenderness, no AAA no bruit.  No HSM or HJR  bilateral pedal pulses absent No edema Neuro non-focal Skin warm and dry No muscular weakness   ASSESSMENT & PLAN:    NICM with EF to 25-30% on echo, now EF improved to 40-45% Continue lower dose coreg and Lasix/aldactone Monitor Optivol remotely entresto resumed June 2023 and coreg increased at that time to 6.25 mg bid as well Update TTE   AICD:  No shocks normal function f/u Ladona Ridgel Medtronic device   CAD patent stent in OM no residual dx cath 03/26/18 continue medical Rx  HLD  on statin labs with primary   HTN Well controlled.  Continue current medications and low sodium Dash type diet.    Tobacco use, has  stopped  + COPD and followed by Pulmonary CTA 02/07/22 with moderate emphysema diffuse bronchial thickening no cancer and no PE  Has not smoked in 4 years no need to update CT  Esophageal thickening: seen on 02/07/22 CT needs GI f/u and possible endoscopy Seen by Leone Payor in 2019 and no issues felt thickening from contraction   PV:  ABI moderately reduced  08/30/22 but stable f/u Arida update ABI's     Medication Adjustments/Labs and Tests Ordered: Current medicines are reviewed at length with the patient today.  Concerns regarding medicines are outlined above.   Tests Ordered:  TTE for DCM ABI's for PVD   Disposition: F/Y Arida for PVD and general cards 6 months   Signed, Charlton Haws, MD  09/26/2023 9:36 AM    [facility]

## 2023-09-16 ENCOUNTER — Ambulatory Visit (INDEPENDENT_AMBULATORY_CARE_PROVIDER_SITE_OTHER): Payer: 59

## 2023-09-16 DIAGNOSIS — I428 Other cardiomyopathies: Secondary | ICD-10-CM

## 2023-09-16 DIAGNOSIS — I5022 Chronic systolic (congestive) heart failure: Secondary | ICD-10-CM

## 2023-09-17 LAB — CUP PACEART REMOTE DEVICE CHECK
Battery Remaining Longevity: 7 mo
Battery Voltage: 2.88 V
Brady Statistic RV Percent Paced: 0 %
Date Time Interrogation Session: 20250107121903
HighPow Impedance: 77 Ohm
Implantable Lead Connection Status: 753985
Implantable Lead Implant Date: 20140212
Implantable Lead Location: 753860
Implantable Lead Model: 6935
Implantable Pulse Generator Implant Date: 20140212
Lead Channel Impedance Value: 380 Ohm
Lead Channel Impedance Value: 437 Ohm
Lead Channel Pacing Threshold Amplitude: 0.625 V
Lead Channel Pacing Threshold Pulse Width: 0.4 ms
Lead Channel Sensing Intrinsic Amplitude: 9.5 mV
Lead Channel Sensing Intrinsic Amplitude: 9.5 mV
Lead Channel Setting Pacing Amplitude: 2.5 V
Lead Channel Setting Pacing Pulse Width: 0.4 ms
Lead Channel Setting Sensing Sensitivity: 0.3 mV
Zone Setting Status: 755011

## 2023-09-25 ENCOUNTER — Telehealth: Payer: Self-pay

## 2023-09-25 NOTE — Telephone Encounter (Signed)
Med Rec complete, allergies and Pharmacy verified September 25 2023.

## 2023-09-26 ENCOUNTER — Encounter: Payer: Self-pay | Admitting: Cardiovascular Disease

## 2023-09-26 ENCOUNTER — Ambulatory Visit: Payer: 59 | Attending: Cardiovascular Disease | Admitting: Cardiovascular Disease

## 2023-09-26 VITALS — BP 132/70 | HR 74 | Ht 69.0 in

## 2023-09-26 DIAGNOSIS — Z87891 Personal history of nicotine dependence: Secondary | ICD-10-CM | POA: Diagnosis not present

## 2023-09-26 DIAGNOSIS — I420 Dilated cardiomyopathy: Secondary | ICD-10-CM | POA: Diagnosis not present

## 2023-09-26 DIAGNOSIS — I10 Essential (primary) hypertension: Secondary | ICD-10-CM

## 2023-09-26 DIAGNOSIS — I739 Peripheral vascular disease, unspecified: Secondary | ICD-10-CM | POA: Diagnosis not present

## 2023-09-26 NOTE — Patient Instructions (Signed)
Medication Instructions:  Your physician recommends that you continue on your current medications as directed. Please refer to the Current Medication list given to you today.  *If you need a refill on your cardiac medications before your next appointment, please call your pharmacy*  Lab Work: If you have labs (blood work) drawn today and your tests are completely normal, you will receive your results only by: MyChart Message (if you have MyChart) OR A paper copy in the mail If you have any lab test that is abnormal or we need to change your treatment, we will call you to review the results.   Testing/Procedures: Your physician has requested that you have an echocardiogram. Echocardiography is a painless test that uses sound waves to create images of your heart. It provides your doctor with information about the size and shape of your heart and how well your heart's chambers and valves are working. This procedure takes approximately one hour. There are no restrictions for this procedure. Please do NOT wear cologne, perfume, aftershave, or lotions (deodorant is allowed). Please arrive 15 minutes prior to your appointment time.  Please note: We ask at that you not bring children with you during ultrasound (echo/ vascular) testing. Due to room size and safety concerns, children are not allowed in the ultrasound rooms during exams. Our front office staff cannot provide observation of children in our lobby area while testing is being conducted. An adult accompanying a patient to their appointment will only be allowed in the ultrasound room at the discretion of the ultrasound technician under special circumstances. We apologize for any inconvenience. Your physician has requested that you have a lower extremity arterial duplex with ABI's. This test is an ultrasound of the arteries in the legs. It looks at arterial blood flow in the legs. Allow one hour for Lower Arterial scans. There are no restrictions or  special instructions.  Please note: We ask at that you not bring children with you during ultrasound (echo/ vascular) testing. Due to room size and safety concerns, children are not allowed in the ultrasound rooms during exams. Our front office staff cannot provide observation of children in our lobby area while testing is being conducted. An adult accompanying a patient to their appointment will only be allowed in the ultrasound room at the discretion of the ultrasound technician under special circumstances. We apologize for any inconvenience.  Follow-Up: At [facility], you and your health needs are our priority.  As part of our continuing mission to provide you with exceptional heart care, we have created designated Provider Care Teams.  These Care Teams include your primary Cardiologist (physician) and Advanced Practice Providers (APPs -  Physician Assistants and Nurse Practitioners) who all work together to provide you with the care you need, when you need it.  We recommend signing up for the patient portal called "MyChart".  Sign up information is provided on this After Visit Summary.  MyChart is used to connect with patients for Virtual Visits (Telemedicine).  Patients are able to view lab/test results, encounter notes, upcoming appointments, etc.  Non-urgent messages can be sent to your provider as well.   To learn more about what you can do with MyChart, go to [email or web address].    Your next appointment:   12 month(s)  Provider:   Charlton Haws, MD     Other Instructions

## 2023-09-28 ENCOUNTER — Other Ambulatory Visit: Payer: Self-pay | Admitting: Cardiovascular Disease

## 2023-09-30 ENCOUNTER — Other Ambulatory Visit: Payer: Self-pay | Admitting: Cardiovascular Disease

## 2023-10-12 ENCOUNTER — Other Ambulatory Visit: Payer: Self-pay | Admitting: Cardiovascular Disease

## 2023-10-13 ENCOUNTER — Ambulatory Visit: Payer: 59 | Attending: Internal Medicine

## 2023-10-13 DIAGNOSIS — Z95810 Presence of automatic (implantable) cardiac defibrillator: Secondary | ICD-10-CM

## 2023-10-13 DIAGNOSIS — I5022 Chronic systolic (congestive) heart failure: Secondary | ICD-10-CM | POA: Diagnosis not present

## 2023-10-21 ENCOUNTER — Ambulatory Visit: Payer: 59 | Admitting: Cardiovascular Disease

## 2023-10-28 ENCOUNTER — Encounter: Payer: Self-pay | Admitting: Cardiovascular Disease

## 2023-10-28 ENCOUNTER — Ambulatory Visit: Payer: 59 | Attending: Cardiovascular Disease | Admitting: Cardiovascular Disease

## 2023-10-28 VITALS — BP 116/69 | HR 74 | Ht 69.0 in | Wt 171.8 lb

## 2023-10-28 DIAGNOSIS — I5022 Chronic systolic (congestive) heart failure: Secondary | ICD-10-CM | POA: Diagnosis not present

## 2023-10-28 DIAGNOSIS — I2510 Atherosclerotic heart disease of native coronary artery without angina pectoris: Secondary | ICD-10-CM

## 2023-10-28 DIAGNOSIS — I10 Essential (primary) hypertension: Secondary | ICD-10-CM

## 2023-10-28 DIAGNOSIS — I739 Peripheral vascular disease, unspecified: Secondary | ICD-10-CM

## 2023-10-28 DIAGNOSIS — E785 Hyperlipidemia, unspecified: Secondary | ICD-10-CM | POA: Diagnosis not present

## 2023-10-28 LAB — CBC

## 2023-10-28 NOTE — Progress Notes (Signed)
Cardiology Office Note   Date:  10/28/2023   ID:  [name], DOB 11-Dec-1955, MRN [id number]  PCP:  Estevan Oaks, NP  Cardiologist:  Dr. Eden Emms  No chief complaint on file.     History of Present Illness: [name] is a 68 y.o. female who is here today for follow-up visit regarding peripheral arterial disease.   She has known history of coronary artery disease, chronic systolic heart failure status post ICD placement, COPD, tobacco use, COPD, hyperlipidemia and previous ventricular tachycardia.  She was diagnosed with bilateral SFA occlusion in 2013.  She was seen by me at that time and given that her symptoms were not lifestyle limiting, I recommended continuing medical therapy and smoking cessation.  She struggled to quit smoking but ultimately was successful.  Most recent ABI was 0.77 on the right and 0.83 on the left.  She reports some cramping in her legs at rest mostly in the front and not the calfs.  When she walks, she has no calf discomfort.  No chest pain or worsening dyspnea.   Past Medical History:  Diagnosis Date   Acute on chronic systolic heart failure (HCC)    Acute respiratory failure (HCC)    Automatic implantable cardioverter-defibrillator in situ    CAD (coronary artery disease)    a. Inf-post MI 2008 s/p BMS to large marginal of Cx.    COPD (chronic obstructive pulmonary disease) (HCC)    ETOH abuse    Hyperglycemia    HYPERLIPIDEMIA-MIXED 02/07/2009   HYPERTENSION, BENIGN 08/22/2009   PVD (peripheral vascular disease) (HCC)    a. Evaluated by Dr. Kirke Corin 08/2012.   SYSTOLIC HEART FAILURE, CHRONIC 02/07/2009   a. EF 30% 2010, 29% by MRI 08/2012. b. s/p prophylactic Medtronic ICD implantation 10/2012.   TIA (transient ischemic attack) 05/18/2014   Tobacco abuse    VENTRICULAR TACHYCARDIA 05/21/2010    Past Surgical History:  Procedure Laterality Date   CARDIAC DEFIBRILLATOR PLACEMENT  10/2012   CESAREAN SECTION  1977; 1982   COLONOSCOPY      CYSTOSCOPY W/ STONE MANIPULATION  1990's   ESOPHAGOGASTRODUODENOSCOPY (EGD) WITH PROPOFOL N/A 08/04/2018   Procedure: ESOPHAGOGASTRODUODENOSCOPY (EGD) WITH PROPOFOL;  Surgeon: Iva Boop, MD;  Location: [facility] ENDOSCOPY;  Service: Endoscopy;  Laterality: N/A;   IMPLANTABLE CARDIOVERTER DEFIBRILLATOR IMPLANT N/A 10/21/2012   Procedure: IMPLANTABLE CARDIOVERTER DEFIBRILLATOR IMPLANT;  Surgeon: Marinus Maw, MD;  Location: [facility] CATH LAB;  Service: Cardiovascular;  Laterality: N/A;   RIGHT/LEFT HEART CATH AND CORONARY ANGIOGRAPHY N/A 03/26/2018   Procedure: RIGHT/LEFT HEART CATH AND CORONARY ANGIOGRAPHY;  Surgeon: Yvonne Kendall, MD;  Location: [facility] INVASIVE CV LAB;  Service: Cardiovascular;  Laterality: N/A;     Current Outpatient Medications  Medication Sig Dispense Refill   albuterol (PROAIR HFA) 108 (90 Base) MCG/ACT inhaler Inhale 2 puffs into the lungs every 6 (six) hours as needed for wheezing or shortness of breath. 18 g 5   alendronate (FOSAMAX) 70 MG tablet Take 70 mg by mouth once a week.     aspirin EC 81 MG tablet Take 81 mg by mouth daily.     atorvastatin (LIPITOR) 40 MG tablet TAKE 1 TABLET BY MOUTH EVERY DAY 90 tablet 1   carvedilol (COREG) 6.25 MG tablet TAKE 1 TABLET BY MOUTH 2 TIMES DAILY WITH A MEAL. 180 tablet 3   feeding supplement, ENSURE COMPLETE, (ENSURE COMPLETE) LIQD Take 237 mLs by mouth 3 (three) times daily between meals.     folic  acid (FOLVITE) 1 MG tablet Take 1 tablet (1 mg total) by mouth daily. 30 tablet 0   furosemide (LASIX) 40 MG tablet Take 1 tablet (40 mg total) by mouth daily as needed for fluid or edema (Anytime you have any of the following symptoms: 1) 3 pound weight gain in 24 hours or 5 pounds in 1 week 2) shortness of breath, with or without a dry hacking cough 3) swelling in the hands, feet or stomach 4) if you have to sleep on extra pillows at night in order to breathe.). 90 tablet 3   levothyroxine (SYNTHROID) 75 MCG tablet Take 75 mcg by mouth  every morning.     Misc. Devices MISC Use scale daily to monitor weight.     Multiple Vitamin (MULTIVITAMIN WITH MINERALS) TABS tablet Take 1 tablet by mouth daily.     nitroGLYCERIN (NITROSTAT) 0.4 MG SL tablet Place 1 tablet (0.4 mg total) under the tongue every 5 (five) minutes as needed for chest pain (up to 3 doses). 25 tablet 4   sacubitril-valsartan (ENTRESTO) 24-26 MG TAKE 1 TABLET BY MOUTH TWICE A DAY 180 tablet 3   spironolactone (ALDACTONE) 25 MG tablet Take 0.5 tablets (12.5 mg total) by mouth daily. 45 tablet 3   thiamine 100 MG tablet Take 1 tablet (100 mg total) by mouth daily. 30 tablet 0   No current facility-administered medications for this visit.    Allergies:   Clopidogrel bisulfate    Social History:  The patient  reports that she quit smoking about 5 years ago. Her smoking use included cigarettes. She started smoking about 45 years ago. She has a 20 pack-year smoking history. She has never used smokeless tobacco. She reports current alcohol use of about 20.0 standard drinks of alcohol per week. She reports that she does not use drugs.   Family History:  The patient's family history includes Asthma in her brother; Cancer in her mother; Heart attack in her father; Other in her brother.    ROS:  Please see the history of present illness.   Otherwise, review of systems are positive for none.   All other systems are reviewed and negative.    PHYSICAL EXAM: VS:  BP 116/69 (BP Location: Left Arm, Patient Position: Sitting)   Pulse 74   Ht 5\' 9"  (1.753 m)   Wt 171 lb 12.8 oz (77.9 kg)   SpO2 91%   BMI 25.37 kg/m  , BMI Body mass index is 25.37 kg/m. GEN: Well nourished, well developed, in no acute distress  HEENT: normal  Neck: no JVD, carotid bruits, or masses Cardiac: RRR; no murmurs, rubs, or gallops,no edema  Respiratory:  clear to auscultation bilaterally, normal work of breathing GI: soft, nontender, nondistended, + BS MS: no deformity or atrophy  Skin: warm  and dry, no rash Neuro:  Strength and sensation are intact Psych: euthymic mood, full affect Distal pulses are not palpable.   EKG:  EKG is not ordered today.    Recent Labs: No results found for requested labs within last 365 days.    Lipid Panel    Component Value Date/Time   CHOL 130 03/23/2018 0900   CHOL 153 02/24/2017 1154   TRIG 66 03/23/2018 0900   HDL 39 (L) 03/23/2018 0900   HDL 74 02/24/2017 1154   CHOLHDL 3.3 03/23/2018 0900   VLDL 13 03/23/2018 0900   LDLCALC 78 03/23/2018 0900   LDLCALC 67 02/24/2017 1154      Wt Readings from Last  3 Encounters:  10/28/23 171 lb 12.8 oz (77.9 kg)  05/23/23 166 lb 6.4 oz (75.5 kg)  10/08/22 168 lb 12.8 oz (76.6 kg)           No data to display            ASSESSMENT AND PLAN:  1.  Peripheral arterial disease: The patient has chronic bilateral SFA occlusion which has been present since at least 2013.   She reports no significant calf claudication at the present time.  She does report some intermittent cramping mostly in the front of her legs and at rest.  This is not consistent with claudication.  Will check electrolytes and routine labs. Continue medical therapy for peripheral arterial disease as currently there is no indication for revascularization.  2.  Chronic systolic heart failure: She appears to be euvolemic and currently on optimal medical therapy.  3.  Coronary artery disease involving native coronary arteries: She denies anginal symptoms.  4.  Hyperlipidemia: Continue treatment with atorvastatin with a target LDL of less than 70.  She had no recent labs and I requested lipid and liver profile.  5.  Essential hypertension: Blood pressures well controlled.  6.  Previous tobacco use: No relapse.    Disposition:   FU with me in 1 year  Signed,  Lorine Bears, MD  10/28/2023 10:07 AM    [facility]

## 2023-10-28 NOTE — Patient Instructions (Signed)
Medication Instructions:  No changes *If you need a refill on your cardiac medications before your next appointment, please call your pharmacy*   Lab Work: Your provider would like for you to have the following labs today: CBC, CMET, Lipid and Magnesium  If you have labs (blood work) drawn today and your tests are completely normal, you will receive your results only by: MyChart Message (if you have MyChart) OR A paper copy in the mail If you have any lab test that is abnormal or we need to change your treatment, we will call you to review the results.   Testing/Procedures: None ordered   Follow-Up: At [facility], you and your health needs are our priority.  As part of our continuing mission to provide you with exceptional heart care, we have created designated Provider Care Teams.  These Care Teams include your primary Cardiologist (physician) and Advanced Practice Providers (APPs -  Physician Assistants and Nurse Practitioners) who all work together to provide you with the care you need, when you need it.  We recommend signing up for the patient portal called "MyChart".  Sign up information is provided on this After Visit Summary.  MyChart is used to connect with patients for Virtual Visits (Telemedicine).  Patients are able to view lab/test results, encounter notes, upcoming appointments, etc.  Non-urgent messages can be sent to your provider as well.   To learn more about what you can do with MyChart, go to [email or web address].    Your next appointment:   12 month(s)  Provider:   Dr. Kirke Corin

## 2023-10-29 LAB — COMPREHENSIVE METABOLIC PANEL
ALT: 49 [IU]/L — ABNORMAL HIGH (ref 0–32)
AST: 101 [IU]/L — ABNORMAL HIGH (ref 0–40)
Albumin: 3.9 g/dL (ref 3.9–4.9)
Alkaline Phosphatase: 183 [IU]/L — ABNORMAL HIGH (ref 44–121)
BUN/Creatinine Ratio: 10 — ABNORMAL LOW (ref 12–28)
BUN: 8 mg/dL (ref 8–27)
Bilirubin Total: 0.5 mg/dL (ref 0.0–1.2)
CO2: 21 mmol/L (ref 20–29)
Calcium: 8.8 mg/dL (ref 8.7–10.3)
Chloride: 102 mmol/L (ref 96–106)
Creatinine, Ser: 0.81 mg/dL (ref 0.57–1.00)
Globulin, Total: 3.4 g/dL (ref 1.5–4.5)
Glucose: 102 mg/dL — ABNORMAL HIGH (ref 70–99)
Potassium: 4.2 mmol/L (ref 3.5–5.2)
Sodium: 139 mmol/L (ref 134–144)
Total Protein: 7.3 g/dL (ref 6.0–8.5)
eGFR: 80 mL/min/{1.73_m2} (ref >59)

## 2023-10-29 LAB — CBC
Hematocrit: 34.9 % (ref 34.0–46.6)
Hemoglobin: 11.8 g/dL (ref 11.1–15.9)
MCH: 33.1 pg — ABNORMAL HIGH (ref 26.6–33.0)
MCHC: 33.8 g/dL (ref 31.5–35.7)
MCV: 98 fL — ABNORMAL HIGH (ref 79–97)
Platelets: 62 10*3/uL — CL (ref 150–450)
RBC: 3.57 x10E6/uL — ABNORMAL LOW (ref 3.77–5.28)
RDW: 12.3 % (ref 11.7–15.4)
WBC: 5.2 10*3/uL (ref 3.4–10.8)

## 2023-10-29 LAB — LIPID PANEL
Chol/HDL Ratio: 2.8 {ratio} (ref 0.0–4.4)
Cholesterol, Total: 93 mg/dL — ABNORMAL LOW (ref 100–199)
HDL: 33 mg/dL — ABNORMAL LOW (ref >39)
LDL Chol Calc (NIH): 37 mg/dL (ref 0–99)
Triglycerides: 131 mg/dL (ref 0–149)
VLDL Cholesterol Cal: 23 mg/dL (ref 5–40)

## 2023-10-29 LAB — MAGNESIUM: Magnesium: 1.6 mg/dL (ref 1.6–2.3)

## 2023-10-29 NOTE — Addendum Note (Signed)
Addended by: Geralyn Flash D on: 10/29/2023 11:28 AM   Modules accepted: Orders

## 2023-10-29 NOTE — Progress Notes (Signed)
 Remote ICD transmission.

## 2023-10-31 ENCOUNTER — Ambulatory Visit (HOSPITAL_BASED_OUTPATIENT_CLINIC_OR_DEPARTMENT_OTHER)
Admission: RE | Admit: 2023-10-31 | Discharge: 2023-10-31 | Disposition: A | Discharge status: Home / Self Care | Payer: 59 | Source: Ambulatory Visit | Attending: Cardiovascular Disease | Admitting: Cardiovascular Disease

## 2023-10-31 ENCOUNTER — Other Ambulatory Visit: Payer: Self-pay | Admitting: *Deleted

## 2023-10-31 ENCOUNTER — Ambulatory Visit (HOSPITAL_COMMUNITY)
Admission: RE | Admit: 2023-10-31 | Discharge: 2023-10-31 | Disposition: A | Discharge status: Home / Self Care | Payer: 59 | Source: Ambulatory Visit | Attending: Cardiovascular Disease | Admitting: Cardiovascular Disease

## 2023-10-31 DIAGNOSIS — I420 Dilated cardiomyopathy: Secondary | ICD-10-CM

## 2023-10-31 DIAGNOSIS — I739 Peripheral vascular disease, unspecified: Secondary | ICD-10-CM

## 2023-10-31 DIAGNOSIS — D691 Qualitative platelet defects: Secondary | ICD-10-CM

## 2023-10-31 LAB — ECHOCARDIOGRAM COMPLETE
AR max vel: 3.13 cm2
AV Area VTI: 2.74 cm2
AV Area mean vel: 2.55 cm2
AV Mean grad: 2 mm[Hg]
AV Peak grad: 3.7 mm[Hg]
Ao pk vel: 0.96 m/s
Area-P 1/2: 4.21 cm2
MV M vel: 2.65 m/s
MV Peak grad: 28.1 mm[Hg]
P 1/2 time: 351 ms
Radius: 0.48 cm
S' Lateral: 4.62 cm

## 2023-11-03 LAB — VAS US ABI WITH/WO TBI
Left ABI: 0.82
Right ABI: 0.73

## 2023-11-16 ENCOUNTER — Other Ambulatory Visit: Payer: Self-pay | Admitting: Cardiovascular Disease

## 2023-11-17 ENCOUNTER — Other Ambulatory Visit: Payer: Self-pay | Admitting: Cardiovascular Disease

## 2023-11-17 ENCOUNTER — Ambulatory Visit: Payer: 59 | Attending: Internal Medicine

## 2023-11-17 ENCOUNTER — Telehealth: Payer: Self-pay | Admitting: Internal Medicine

## 2023-11-17 ENCOUNTER — Other Ambulatory Visit: Payer: Self-pay | Admitting: Medical Oncology

## 2023-11-17 DIAGNOSIS — Z95810 Presence of automatic (implantable) cardiac defibrillator: Secondary | ICD-10-CM

## 2023-11-17 DIAGNOSIS — I5022 Chronic systolic (congestive) heart failure: Secondary | ICD-10-CM

## 2023-11-17 DIAGNOSIS — D539 Nutritional anemia, unspecified: Secondary | ICD-10-CM

## 2023-11-17 NOTE — Telephone Encounter (Signed)
 Refill request

## 2023-11-17 NOTE — Telephone Encounter (Signed)
 Rescheduled appointments per patients request. The patient is aware of the changes made.

## 2023-11-18 ENCOUNTER — Inpatient Hospital Stay: Payer: 59 | Admitting: Internal Medicine

## 2023-11-18 ENCOUNTER — Inpatient Hospital Stay: Payer: 59

## 2023-11-18 NOTE — Progress Notes (Signed)
 EPIC Encounter for ICM Monitoring  Patient Name: [name] is a 68 y.o. female Date: 11/18/2023 Primary Care Physican: Estevan Oaks, NP Primary Cardiologist: Eden Emms Electrophysiologist: Ladona Ridgel 08/27/2022 Weight: 165.3 lbs 09/06/2022 Weight: 165.1 lbs 12/18/2022 Weight: 167.1 lbs 01/21/2023 Weight: 165.8 lbs 02/26/2023 Weight: 165.8 lbs 04/02/2023 Weight: 166.4 lbs 05/08/2023 Weight: 163.8 lbs 06/24/2023 Weight: 166 lbs      10/28/2023 Office Weight: 171 lbs      11/18/2023 Weight: 164.5 lbs                                               Spoke with patient and heart failure questions reviewed.  Transmission results reviewed.  Pt reports weight gain mid Feb and took PRN Lasix x 2 days.     Optivol thoracic impedance suggesting possible fluid accumulation starting 2/7.    Fluid index greater than normal starting 2/27.   Prescribed: Furosemide 40 mg Take 1 tablet (40 mg total) by mouth daily as needed for fluid or edema (Anytime you have any of the following symptoms: 1) 3 pound weight gain in 24 hours or 5 pounds in 1 week 2) shortness of breath, with or without a dry hacking cough 3) swelling in the hands, feet or stomach 4) if you have to sleep on extra pillows at night in order to breathe). Spironolactone 25 mg take 0.5 tablet (12.5 mg total)  by mouth daily.   Labs: 10/28/2023 Creatinine 0.81, BUN 8, Potassium 4.2, Sodium 139, GFR 80 A complete set of results can be found in Results Review.   Recommendations:  Advised to take PRN Furosemide 1 tablet on Wed, Thursday and Saturday this week.    Follow-up plan: ICM clinic phone appointment on 11/24/2023 (manual) to recheck fluid levels.   91 day device clinic remote transmission 12/17/2023.     EP/Cardiology Office Visits:  Recall 09/20/2024 with Dr Eden Emms.  10/28/2023 with Dr Kirke Corin.      Copy of ICM check sent to Dr. Ladona Ridgel .  3 month ICM trend: 11/17/2023.    12-14 Month ICM trend:     Karie Soda, RN 11/18/2023 8:32  AM

## 2023-11-24 ENCOUNTER — Ambulatory Visit: Attending: Internal Medicine

## 2023-11-24 DIAGNOSIS — Z95810 Presence of automatic (implantable) cardiac defibrillator: Secondary | ICD-10-CM

## 2023-11-24 DIAGNOSIS — I5022 Chronic systolic (congestive) heart failure: Secondary | ICD-10-CM

## 2023-11-26 ENCOUNTER — Telehealth: Payer: Self-pay

## 2023-11-26 NOTE — Progress Notes (Unsigned)
 EPIC Encounter for ICM Monitoring  Patient Name: [name] is a 68 y.o. female Date: 11/26/2023 Primary Care Physican: Estevan Oaks, NP Primary Cardiologist: Eden Emms Electrophysiologist: Ladona Ridgel 04/02/2023 Weight: 166.4 lbs 05/08/2023 Weight: 163.8 lbs 06/24/2023 Weight: 166 lbs      10/28/2023 Office Weight: 171 lbs      11/18/2023 Weight: 164.5 lbs                                               Spoke with patient and heart failure questions reviewed.  Transmission results reviewed.  Pt asymptomatic for fluid accumulation.  Reports feeling well at this time and voices no complaints.     Optivol thoracic impedance suggesting fluid levels returning close to baseline after recommendation on 3/10 to take PRN Lasix 3 days in a week.    Fluid index greater than normal starting 2/27.   Prescribed: Furosemide 40 mg Take 1 tablet (40 mg total) by mouth daily as needed for fluid or edema (Anytime you have any of the following symptoms: 1) 3 pound weight gain in 24 hours or 5 pounds in 1 week 2) shortness of breath, with or without a dry hacking cough 3) swelling in the hands, feet or stomach 4) if you have to sleep on extra pillows at night in order to breathe). Spironolactone 25 mg take 0.5 tablet (12.5 mg total)  by mouth daily.   Labs: 10/28/2023 Creatinine 0.81, BUN 8, Potassium 4.2, Sodium 139, GFR 80 A complete set of results can be found in Results Review.   Recommendations:  No changes and encouraged to call if experiencing any fluid symptoms.   Follow-up plan: ICM clinic phone appointment on 12/22/2023.   91 day device clinic remote transmission 12/17/2023.     EP/Cardiology Office Visits:  Recall 09/20/2024 with Dr Eden Emms.   Recall 10/27/2024 with Dr Kirke Corin.      Copy of ICM check sent to Dr. Ladona Ridgel.  3 month ICM trend: 11/26/2023.    12-14 Month ICM trend:     Karie Soda, RN 11/26/2023 12:26 PM

## 2023-11-26 NOTE — Telephone Encounter (Signed)
 Remote ICM transmission received.  Attempted call to patient regarding ICM remote transmission and left message to return call with phone number.

## 2023-12-08 ENCOUNTER — Inpatient Hospital Stay: Attending: Internal Medicine

## 2023-12-08 ENCOUNTER — Inpatient Hospital Stay (HOSPITAL_BASED_OUTPATIENT_CLINIC_OR_DEPARTMENT_OTHER): Admitting: Internal Medicine

## 2023-12-08 DIAGNOSIS — D696 Thrombocytopenia, unspecified: Secondary | ICD-10-CM | POA: Diagnosis present

## 2023-12-08 DIAGNOSIS — D539 Nutritional anemia, unspecified: Secondary | ICD-10-CM

## 2023-12-08 LAB — CBC WITH DIFFERENTIAL (CANCER CENTER ONLY)
Abs Immature Granulocytes: 0.01 10*3/uL (ref 0.00–0.07)
Basophils Absolute: 0.1 10*3/uL (ref 0.0–0.1)
Basophils Relative: 1 %
Eosinophils Absolute: 0.5 10*3/uL (ref 0.0–0.5)
Eosinophils Relative: 10 %
HCT: 34.3 % — ABNORMAL LOW (ref 36.0–46.0)
Hemoglobin: 11.6 g/dL — ABNORMAL LOW (ref 12.0–15.0)
Immature Granulocytes: 0 %
Lymphocytes Relative: 62 %
Lymphs Abs: 2.8 10*3/uL (ref 0.7–4.0)
MCH: 32.4 pg (ref 26.0–34.0)
MCHC: 33.8 g/dL (ref 30.0–36.0)
MCV: 95.8 fL (ref 80.0–100.0)
Monocytes Absolute: 0.5 10*3/uL (ref 0.1–1.0)
Monocytes Relative: 11 %
Neutro Abs: 0.7 10*3/uL — ABNORMAL LOW (ref 1.7–7.7)
Neutrophils Relative %: 16 %
Platelet Count: 84 10*3/uL — ABNORMAL LOW (ref 150–400)
RBC: 3.58 MIL/uL — ABNORMAL LOW (ref 3.87–5.11)
RDW: 14.5 % (ref 11.5–15.5)
WBC Count: 4.5 10*3/uL (ref 4.0–10.5)
nRBC: 0 % (ref 0.0–0.2)

## 2023-12-08 LAB — VITAMIN B12: Vitamin B-12: 211 pg/mL (ref 180–914)

## 2023-12-08 LAB — CMP (CANCER CENTER ONLY)
ALT: 41 U/L (ref 0–44)
AST: 100 U/L — ABNORMAL HIGH (ref 15–41)
Albumin: 4.1 g/dL (ref 3.5–5.0)
Alkaline Phosphatase: 115 U/L (ref 38–126)
Anion gap: 8 (ref 5–15)
BUN: 7 mg/dL — ABNORMAL LOW (ref 8–23)
CO2: 28 mmol/L (ref 22–32)
Calcium: 9.5 mg/dL (ref 8.9–10.3)
Chloride: 108 mmol/L (ref 98–111)
Creatinine: 0.59 mg/dL (ref 0.44–1.00)
GFR, Estimated: >60 mL/min (ref >60)
Glucose, Bld: 101 mg/dL — ABNORMAL HIGH (ref 70–99)
Potassium: 3.7 mmol/L (ref 3.5–5.1)
Sodium: 144 mmol/L (ref 135–145)
Total Bilirubin: 0.6 mg/dL (ref 0.0–1.2)
Total Protein: 7.9 g/dL (ref 6.5–8.1)

## 2023-12-08 LAB — IRON AND IRON BINDING CAPACITY (CC-WL,HP ONLY)
Iron: 159 ug/dL (ref 28–170)
Saturation Ratios: 48 % — ABNORMAL HIGH (ref 10.4–31.8)
TIBC: 335 ug/dL (ref 250–450)
UIBC: 176 ug/dL (ref 148–442)

## 2023-12-08 LAB — FERRITIN: Ferritin: 1331 ng/mL — ABNORMAL HIGH (ref 11–307)

## 2023-12-08 LAB — FOLATE: Folate: 9.2 ng/mL (ref >5.9)

## 2023-12-08 NOTE — Progress Notes (Signed)
 [facility] [phone]   [phone]  OFFICE PROGRESS NOTE  Estevan Oaks, NP [address] [place] [address]  DIAGNOSIS:  Iron overload with significant elevation of her ferritin level as well as serum iron and iron saturation.  The patient has multiple medical problems including congestive heart failure as well as COPD and alcohol abuse.  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 The patient also has elevated AFP and liver cirrhosis.  PRIOR THERAPY: None  CURRENT THERAPY: Observation  INTERVAL HISTORY: [name] 68 y.o. female returns to the clinic today for follow-up visit.Discussed the use of AI scribe software for clinical note transcription with the patient, who gave verbal consent to proceed.  History of Present Illness   [name] is a 68 year old female with liver cirrhosis and iron overload who presents for re-evaluation after being lost to follow-up. She was referred by her family doctor for lab work evaluation.  Initially evaluated in May 2023 for iron overload and liver cirrhosis, she was lost to follow-up for nearly two years and is now here for re-evaluation.  She has liver cirrhosis attributed to alcohol use, consuming a 24-ounce can of beer every other day. Her platelet count was 62,000 in February 2025, prompting referral for further evaluation. Current platelet count is 84,000. No bleeding or bruising reported.  She has iron overload with one positive hemochromatosis gene, H63D. Previous labs showed elevated AFP levels. Awaiting current iron and ferritin levels.  She experiences swelling, previously linked to low potassium levels.       MEDICAL HISTORY: Past Medical History:  Diagnosis Date   Acute on chronic systolic heart failure (HCC)    Acute respiratory failure (HCC)    Automatic implantable  cardioverter-defibrillator in situ    CAD (coronary artery disease)    a. Inf-post MI 2008 s/p BMS to large marginal of Cx.    COPD (chronic obstructive pulmonary disease) (HCC)    ETOH abuse    Hyperglycemia    HYPERLIPIDEMIA-MIXED 02/07/2009   HYPERTENSION, BENIGN 08/22/2009   PVD (peripheral vascular disease) (HCC)    a. Evaluated by Dr. Kirke Corin 08/2012.   SYSTOLIC HEART FAILURE, CHRONIC 02/07/2009   a. EF 30% 2010, 29% by MRI 08/2012. b. s/p prophylactic Medtronic ICD implantation 10/2012.   TIA (transient ischemic attack) 05/18/2014   Tobacco abuse    VENTRICULAR TACHYCARDIA 05/21/2010    ALLERGIES:  is allergic to clopidogrel bisulfate.  MEDICATIONS:  Current Outpatient Medications  Medication Sig Dispense Refill   albuterol (PROAIR HFA) 108 (90 Base) MCG/ACT inhaler Inhale 2 puffs into the lungs every 6 (six) hours as needed for wheezing or shortness of breath. 18 g 5   alendronate (FOSAMAX) 70 MG tablet Take 70 mg by mouth once a week.     aspirin EC 81 MG tablet Take 81 mg by mouth daily.     atorvastatin (LIPITOR) 40 MG tablet TAKE 1 TABLET BY MOUTH EVERY DAY 90 tablet 1   carvedilol (COREG) 6.25 MG tablet TAKE 1 TABLET BY MOUTH 2 TIMES DAILY WITH A MEAL. 180 tablet 3   feeding supplement, ENSURE COMPLETE, (ENSURE COMPLETE) LIQD Take 237 mLs by mouth 3 (three) times daily between meals.     folic acid (FOLVITE) 1 MG tablet Take 1 tablet (1 mg total) by mouth daily. 30 tablet 0   furosemide (LASIX) 40 MG  tablet Take 1 tablet (40 mg total) by mouth daily as needed for fluid or edema (Anytime you have any of the following symptoms: 1) 3 pound weight gain in 24 hours or 5 pounds in 1 week 2) shortness of breath, with or without a dry hacking cough 3) swelling in the hands, feet or stomach 4) if you have to sleep on extra pillows at night in order to breathe.). 90 tablet 3   levothyroxine (SYNTHROID) 75 MCG tablet Take 75 mcg by mouth every morning.     Misc. Devices MISC Use scale daily to  monitor weight.     Multiple Vitamin (MULTIVITAMIN WITH MINERALS) TABS tablet Take 1 tablet by mouth daily.     nitroGLYCERIN (NITROSTAT) 0.4 MG SL tablet Place 1 tablet (0.4 mg total) under the tongue every 5 (five) minutes as needed for chest pain (up to 3 doses). 25 tablet 4   sacubitril-valsartan (ENTRESTO) 24-26 MG TAKE 1 TABLET BY MOUTH TWICE A DAY 180 tablet 3   spironolactone (ALDACTONE) 25 MG tablet TAKE 1/2 TABLET BY MOUTH EVERY DAY 45 tablet 3   thiamine 100 MG tablet Take 1 tablet (100 mg total) by mouth daily. 30 tablet 0   No current facility-administered medications for this visit.    SURGICAL HISTORY:  Past Surgical History:  Procedure Laterality Date   CARDIAC DEFIBRILLATOR PLACEMENT  10/2012   CESAREAN SECTION  1977; 1982   COLONOSCOPY     CYSTOSCOPY W/ STONE MANIPULATION  1990's   ESOPHAGOGASTRODUODENOSCOPY (EGD) WITH PROPOFOL N/A 08/04/2018   Procedure: ESOPHAGOGASTRODUODENOSCOPY (EGD) WITH PROPOFOL;  Surgeon: Iva Boop, MD;  Location: [facility] ENDOSCOPY;  Service: Endoscopy;  Laterality: N/A;   IMPLANTABLE CARDIOVERTER DEFIBRILLATOR IMPLANT N/A 10/21/2012   Procedure: IMPLANTABLE CARDIOVERTER DEFIBRILLATOR IMPLANT;  Surgeon: Marinus Maw, MD;  Location: [facility] CATH LAB;  Service: Cardiovascular;  Laterality: N/A;   RIGHT/LEFT HEART CATH AND CORONARY ANGIOGRAPHY N/A 03/26/2018   Procedure: RIGHT/LEFT HEART CATH AND CORONARY ANGIOGRAPHY;  Surgeon: Yvonne Kendall, MD;  Location: [facility] INVASIVE CV LAB;  Service: Cardiovascular;  Laterality: N/A;    REVIEW OF SYSTEMS:  A comprehensive review of systems was negative.   PHYSICAL EXAMINATION: General appearance: alert, cooperative, and no distress Head: Normocephalic, without obvious abnormality, atraumatic Neck: no adenopathy, no JVD, supple, symmetrical, trachea midline, and thyroid not enlarged, symmetric, no tenderness/mass/nodules Lymph nodes: Cervical, supraclavicular, and axillary nodes normal. Resp: clear to auscultation  bilaterally Back: symmetric, no curvature. ROM normal. No CVA tenderness. Cardio: regular rate and rhythm, S1, S2 normal, no murmur, click, rub or gallop GI: soft, non-tender; bowel sounds normal; no masses,  no organomegaly Extremities: extremities normal, atraumatic, no cyanosis or edema  ECOG PERFORMANCE STATUS: 1 - Symptomatic but completely ambulatory  Blood pressure (!) 140/89, pulse 82, temperature (!) 97.3 F (36.3 C), resp. rate 16, height 5\' 9"  (1.753 m), weight 165 lb 3.2 oz (74.9 kg), SpO2 98%.  LABORATORY DATA: Lab Results  Component Value Date   WBC 4.5 12/08/2023   HGB 11.6 (L) 12/08/2023   HCT 34.3 (L) 12/08/2023   MCV 95.8 12/08/2023   PLT 84 (L) 12/08/2023      Chemistry      Component Value Date/Time   NA 139 10/28/2023 1015   K 4.2 10/28/2023 1015   CL 102 10/28/2023 1015   CO2 21 10/28/2023 1015   BUN 8 10/28/2023 1015   CREATININE 0.81 10/28/2023 1015   CREATININE 0.82 01/29/2022 1137      Component Value Date/Time  CALCIUM 8.8 10/28/2023 1015   ALKPHOS 183 (H) 10/28/2023 1015   AST 101 (H) 10/28/2023 1015   AST 74 (H) 01/29/2022 1137   ALT 49 (H) 10/28/2023 1015   ALT 33 01/29/2022 1137   BILITOT 0.5 10/28/2023 1015   BILITOT 0.7 01/29/2022 1137       RADIOGRAPHIC STUDIES: No results found.  ASSESSMENT AND PLAN:    Liver cirrhosis with thrombocytopenia Liver cirrhosis likely secondary to alcohol use, with current alcohol consumption of approximately one 24-ounce can of beer every other day. Platelet count is low at 84,000, consistent with cirrhosis. No evidence of bleeding or bruising, which are potential complications of thrombocytopenia associated with cirrhosis. Regular monitoring is necessary to manage potential complications. - Advise to reduce alcohol consumption to prevent further liver damage. - Monitor platelet count regularly. - Schedule follow-up visit in six months unless lab results indicate a need for earlier intervention. -  Monitor for signs of bleeding or bruising.  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 Previous evaluations indicated iron overload, but current iron and ferritin levels are pending. Regular monitoring is necessary to manage potential complications. - Await results of iron and ferritin levels. - Continue regular monitoring of iron levels.  Follow-up She was lost to follow-up for almost two years. Regular follow-up is necessary to monitor her liver cirrhosis, thrombocytopenia, and iron levels. - Schedule follow-up visit in six months. - Contact sooner if lab results indicate concerning findings.   The patient was advised to call immediately if she has any concerning symptoms in the interval.  The patient voices understanding of current disease status and treatment options and is in agreement with the current care plan.  All questions were answered. The patient knows to call the clinic with any problems, questions or concerns. We can certainly see the patient much sooner if necessary. The total time spent in the appointment was 30 minutes.  Disclaimer: This note was dictated with voice recognition software. Similar sounding words can inadvertently be transcribed and may not be corrected upon review.

## 2023-12-17 ENCOUNTER — Ambulatory Visit (INDEPENDENT_AMBULATORY_CARE_PROVIDER_SITE_OTHER): Payer: Medicare Other

## 2023-12-17 DIAGNOSIS — I420 Dilated cardiomyopathy: Secondary | ICD-10-CM | POA: Diagnosis not present

## 2023-12-18 LAB — CUP PACEART REMOTE DEVICE CHECK
Battery Remaining Longevity: 6 mo
Battery Voltage: 2.87 V
Brady Statistic RV Percent Paced: 0.03 %
Date Time Interrogation Session: 20250409122704
HighPow Impedance: 75 Ohm
Implantable Lead Connection Status: 753985
Implantable Lead Implant Date: 20140212
Implantable Lead Location: 753860
Implantable Lead Model: 6935
Implantable Pulse Generator Implant Date: 20140212
Lead Channel Impedance Value: 399 Ohm
Lead Channel Impedance Value: 456 Ohm
Lead Channel Pacing Threshold Amplitude: 0.625 V
Lead Channel Pacing Threshold Pulse Width: 0.4 ms
Lead Channel Sensing Intrinsic Amplitude: 8.875 mV
Lead Channel Sensing Intrinsic Amplitude: 8.875 mV
Lead Channel Setting Pacing Amplitude: 2.5 V
Lead Channel Setting Pacing Pulse Width: 0.4 ms
Lead Channel Setting Sensing Sensitivity: 0.3 mV
Zone Setting Status: 755011

## 2023-12-29 ENCOUNTER — Ambulatory Visit: Attending: Internal Medicine

## 2023-12-29 DIAGNOSIS — Z95810 Presence of automatic (implantable) cardiac defibrillator: Secondary | ICD-10-CM

## 2023-12-29 DIAGNOSIS — I5022 Chronic systolic (congestive) heart failure: Secondary | ICD-10-CM | POA: Diagnosis not present

## 2024-01-01 ENCOUNTER — Telehealth: Payer: Self-pay

## 2024-01-01 NOTE — Telephone Encounter (Signed)
 Remote ICM transmission received.  Attempted call to patient regarding ICM remote transmission and left detailed message per DPR.  Left ICM phone number and advised to return call for any fluid symptoms or questions. Next ICM remote transmission scheduled 02/03/2024.

## 2024-01-01 NOTE — Progress Notes (Signed)
 EPIC Encounter for ICM Monitoring  Patient Name: [name] is a 68 y.o. female Date: 01/01/2024 Primary Care Physican: Carolyn Cisco, NP Primary Cardiologist: Stann Earnest Electrophysiologist: Carolynne Citron 04/02/2023 Weight: 166.4 lbs 05/08/2023 Weight: 163.8 lbs 06/24/2023 Weight: 166 lbs      10/28/2023 Office Weight: 171 lbs      11/18/2023 Weight: 164.5 lbs    01/01/2024 Weight: 164 lbs  Battery Longevity: 8 months                                            Spoke with patient and heart failure questions reviewed.  Transmission results reviewed.  Pt asymptomatic for fluid accumulation.  Reports feeling well at this time and voices no complaints.     Optivol thoracic impedance suggesting normal fluid levels since 4/7.   Prescribed: Furosemide  40 mg Take 1 tablet (40 mg total) by mouth daily as needed for fluid or edema (Anytime you have any of the following symptoms: 1) 3 pound weight gain in 24 hours or 5 pounds in 1 week 2) shortness of breath, with or without a dry hacking cough 3) swelling in the hands, feet or stomach 4) if you have to sleep on extra pillows at night in order to breathe). Spironolactone  25 mg take 0.5 tablet (12.5 mg total)  by mouth daily.   Labs: 10/28/2023 Creatinine 0.81, BUN 8, Potassium 4.2, Sodium 139, GFR 80 A complete set of results can be found in Results Review.   Recommendations:  No changes and encouraged to call if experiencing any fluid symptoms.   Follow-up plan: ICM clinic phone appointment on 02/03/2024.   91 day device clinic remote transmission 03/18/2024.     EP/Cardiology Office Visits:  Recall 09/20/2024 with Dr Stann Earnest.   Recall 10/27/2024 with Dr Alvenia Aus.    Last visit with Dr Carolynne Citron 05/23/2023.    Copy of ICM check sent to Dr. Carolynne Citron.  3 month ICM trend: 12/29/2023.    12-14 Month ICM trend:     Almyra Jain, RN 01/01/2024 8:43 AM

## 2024-01-30 NOTE — Progress Notes (Signed)
 Remote ICD transmission.

## 2024-01-30 NOTE — Addendum Note (Signed)
 Addended by: Lott Rouleau A on: 01/30/2024 02:29 PM   Modules accepted: Orders

## 2024-02-03 ENCOUNTER — Ambulatory Visit: Attending: Internal Medicine

## 2024-02-03 DIAGNOSIS — Z95810 Presence of automatic (implantable) cardiac defibrillator: Secondary | ICD-10-CM | POA: Diagnosis not present

## 2024-02-03 DIAGNOSIS — I5022 Chronic systolic (congestive) heart failure: Secondary | ICD-10-CM | POA: Diagnosis not present

## 2024-02-04 IMAGING — CT CT ANGIO CHEST
2 of 6 series · 18 of 36 positions shown · IV contrast (agent unspecified)
Comparison: 12/02/2021

CLINICAL DATA: Pulmonary embolism

Shortness of breath
EXAM:
CT ANGIOGRAPHY CHEST WITH CONTRAST
TECHNIQUE: Multidetector CT imaging of the chest was performed using the
standard protocol during bolus administration of intravenous
contrast. Multiplanar CT image reconstructions and MIPs were
obtained to evaluate the vascular anatomy.

[Series 5: thins · axial · 0.71mm/px · z∈[-130,+166]mm · 17 of 333 slices shown]
[im 19/333  lung]
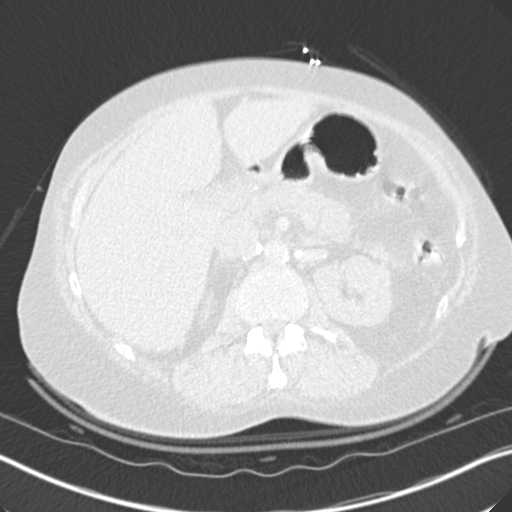
[im 37/333  mediastinal]
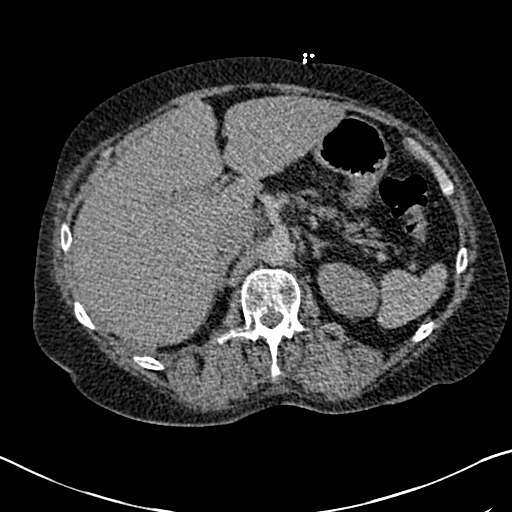
[im 56/333  lung]
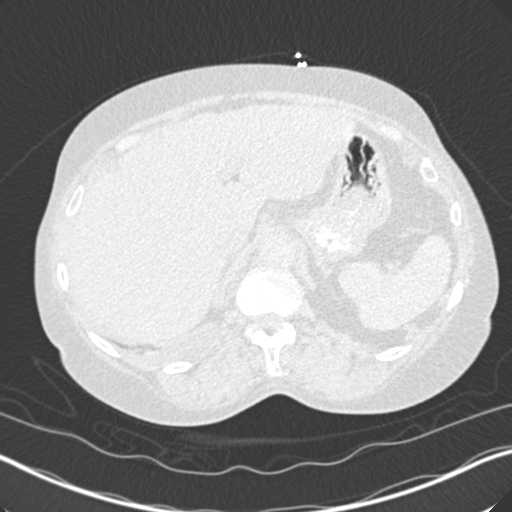
[im 74/333  mediastinal]
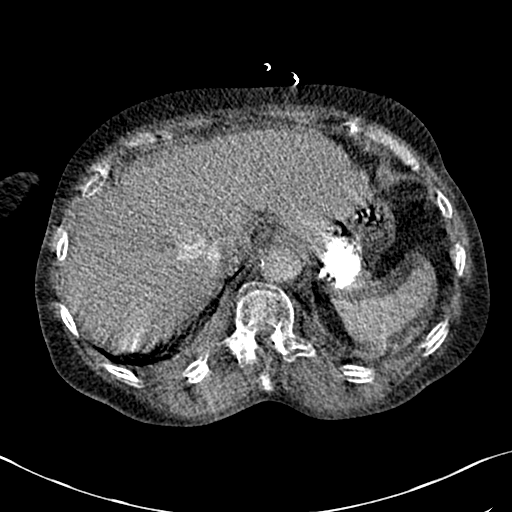
[im 93/333  lung]
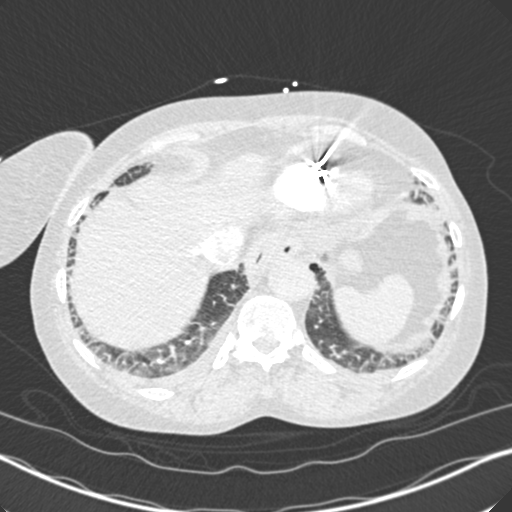
[im 111/333  mediastinal]
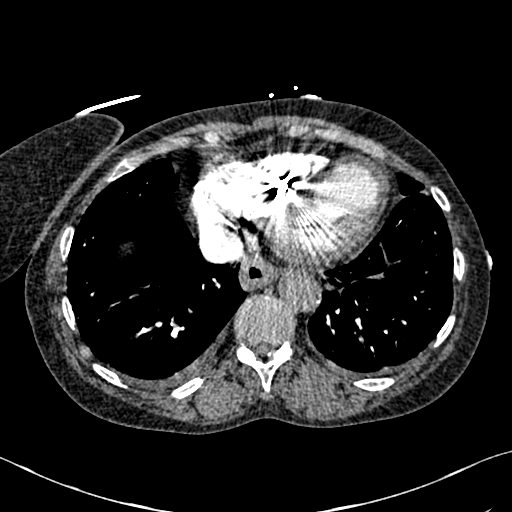
[im 130/333  lung]
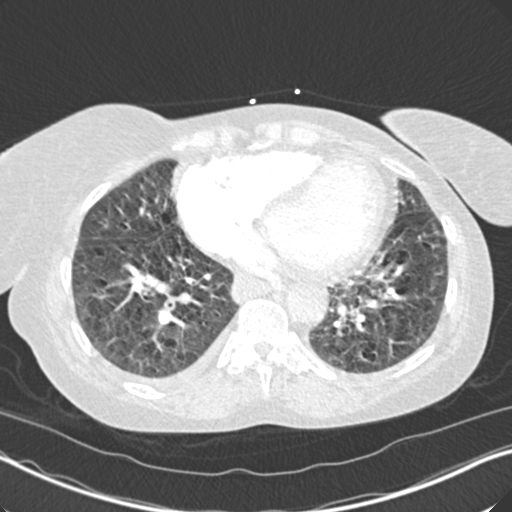
[im 148/333  mediastinal]
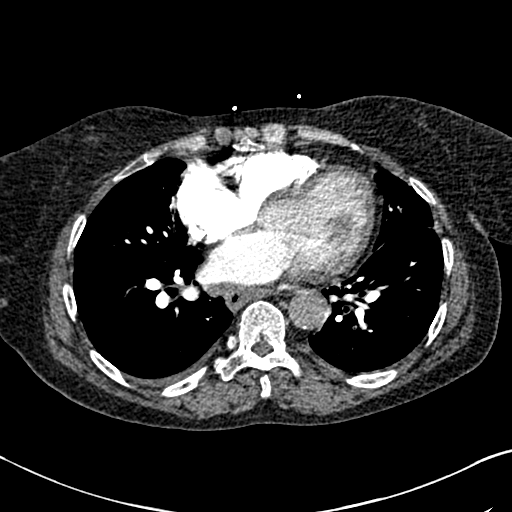
[im 167/333  lung]
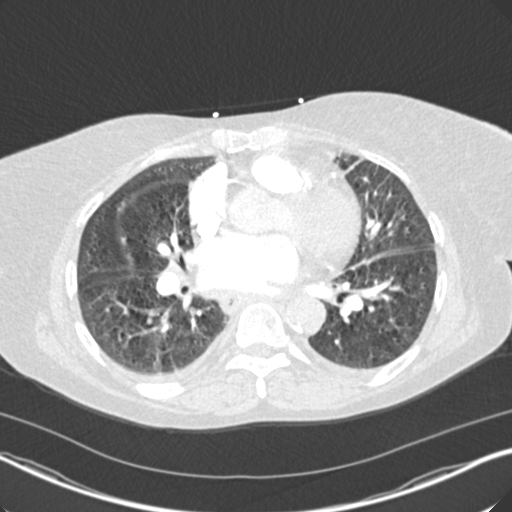
[im 185/333  mediastinal]
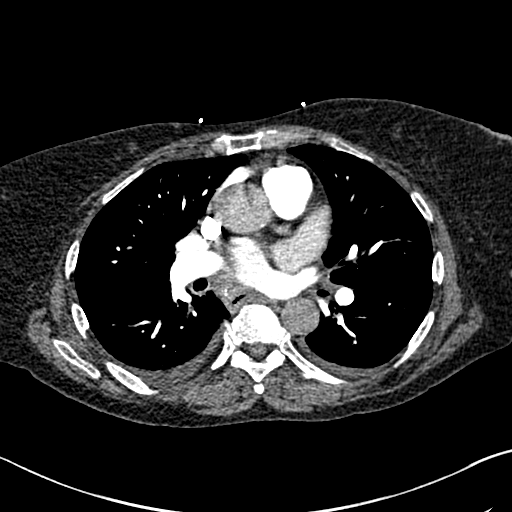
[im 203/333  lung]
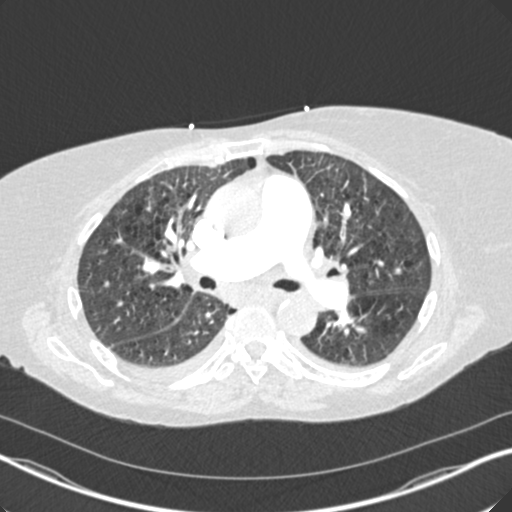
[im 222/333  mediastinal]
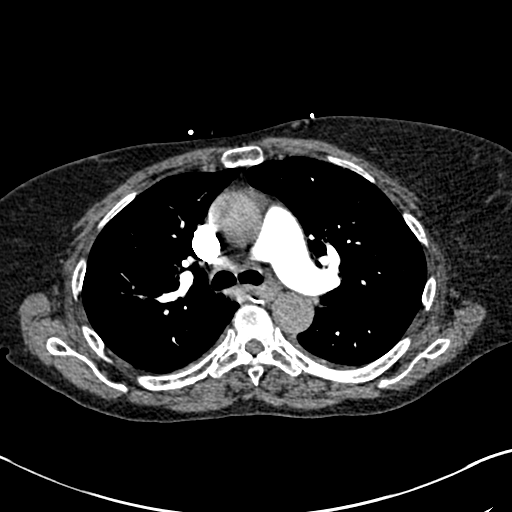
[im 240/333  lung]
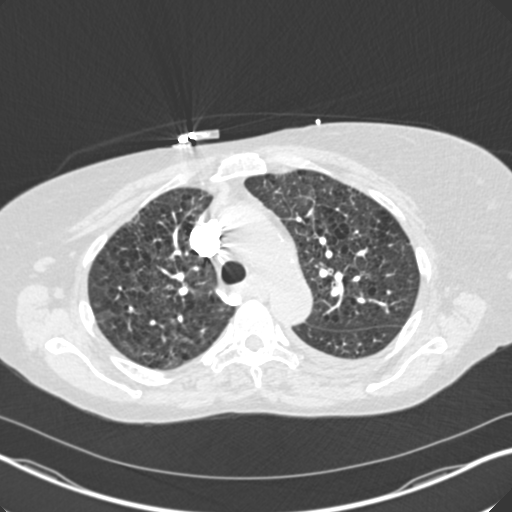
[im 259/333  mediastinal]
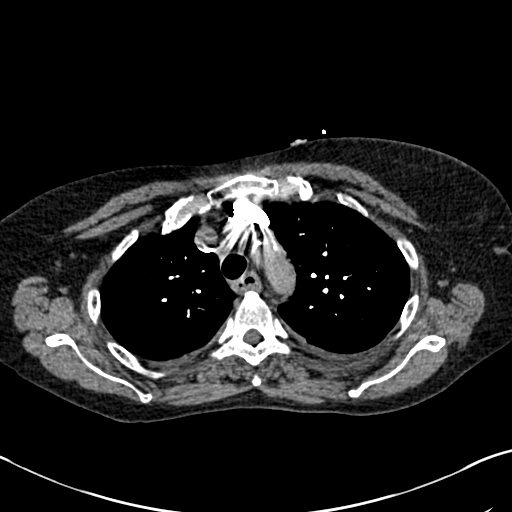
[im 277/333  lung]
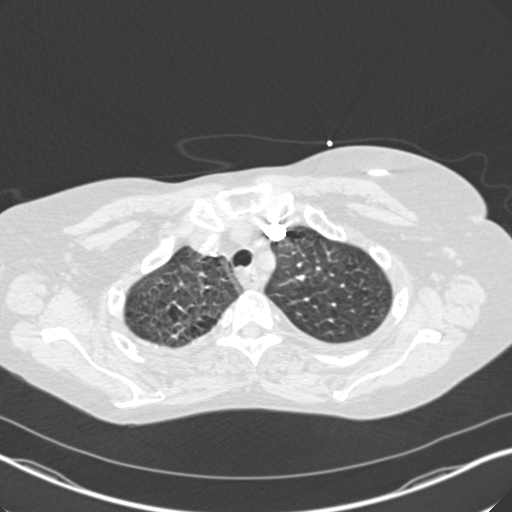
[im 296/333  mediastinal]
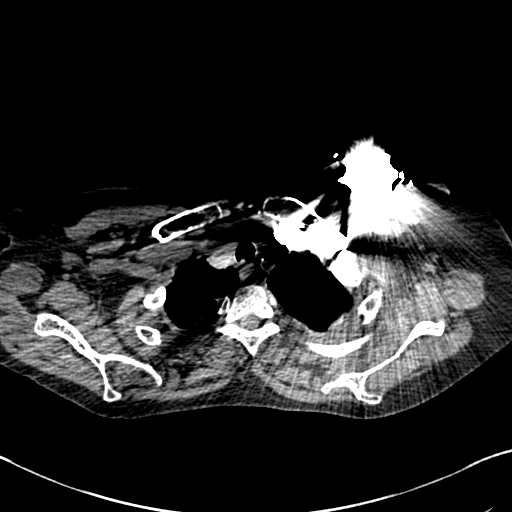
[im 314/333  lung]
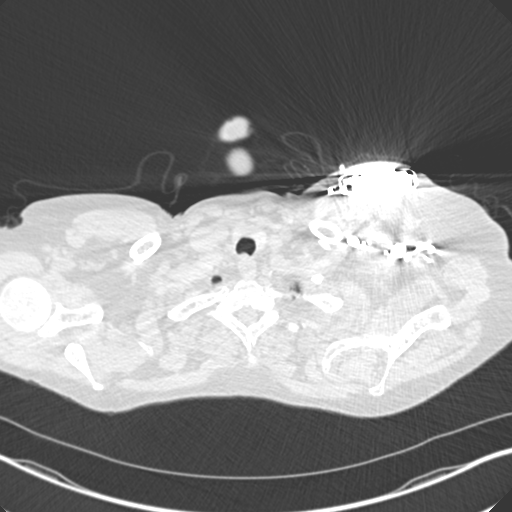

[Series 6: coronal mpr · coronal · 0.72mm/px · 1 of 128 slices shown]
[im 64/128  mediastinal]
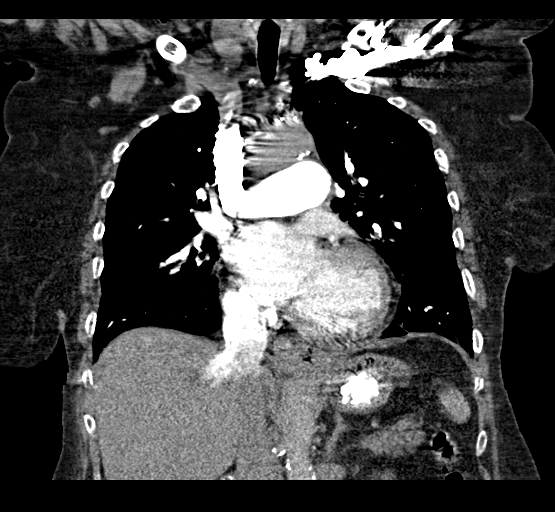

[18 of 36 positions shown; findings below may reference images not displayed]

RADIATION DOSE REDUCTION: This exam was performed according to the
departmental dose-optimization program which includes automated
exposure control, adjustment of the mA and/or kV according to
patient size and/or use of iterative reconstruction technique.

CONTRAST:  75 mL OMNIPAQUE IOHEXOL 350 MG/ML SOLN
FINDINGS: Cardiovascular: Single lead left chest wall AICD is seen on scout
image. Heart size is within normal limits. Evaluation of the lung
bases limited by motion. No pulmonary artery embolism is identified.

Mediastinum/Nodes: No enlarged mediastinal, hilar, or axillary lymph
nodes. Mild diffuse thickening of the wall the esophagus,
particularly in the distal segment. This is best appreciated on
image 113 of series 4.

Lungs/Pleura: Moderate emphysematous changes the lungs. No focal
airspace opacity to indicate pneumonia.

Upper Abdomen: Evaluation of the upper abdomen is significantly
limited by motion. No acute abnormality is seen.

Musculoskeletal: Evaluation for fractures is limited by motion. No
definitive acute abnormality of the visualized osseous structures.

Review of the MIP images confirms the above findings.
IMPRESSION: 1. No pulmonary artery embolism.
2. Evaluation for fractures is significantly limited due to motion.
If there is high clinical suspicion for rib or spine fractures,
repeat noncontrast chest CT should be performed.
3. Diffuse thickening of the distal esophageal wall most consistent
with esophagitis. Correlation with endoscopy should be performed on
nonemergent basis to evaluate for underlying mass.

## 2024-02-06 NOTE — Progress Notes (Signed)
 EPIC Encounter for ICM Monitoring  Patient Name: [name] is a 68 y.o. female Date: 02/06/2024 Primary Care Physican: Carolyn Cisco, NP Primary Cardiologist: Stann Earnest Electrophysiologist: Carolynne Citron 04/02/2023 Weight: 166.4 lbs 05/08/2023 Weight: 163.8 lbs 06/24/2023 Weight: 166 lbs      10/28/2023 Office Weight: 171 lbs      11/18/2023 Weight: 164.5 lbs    01/01/2024 Weight: 164 lbs 02/06/2024 Weight: 159 lbs   Battery Longevity: 8 months                                            Spoke with patient and heart failure questions reviewed.  Transmission results reviewed.  Pt asymptomatic for fluid accumulation.  Reports feeling well at this time and voices no complaints.      Optivol thoracic impedance suggesting normal fluid levels with the exception of possible fluid accumulation from 5/10-5/19.   Prescribed: Furosemide  40 mg Take 1 tablet (40 mg total) by mouth daily as needed for fluid or edema (Anytime you have any of the following symptoms: 1) 3 pound weight gain in 24 hours or 5 pounds in 1 week 2) shortness of breath, with or without a dry hacking cough 3) swelling in the hands, feet or stomach 4) if you have to sleep on extra pillows at night in order to breathe). Spironolactone  25 mg take 0.5 tablet (12.5 mg total)  by mouth daily.   Labs: 12/08/2023 Creatinine 0.59, BUN 7, Potassium 3.7, Sodium 144, GFR >60 10/28/2023 Creatinine 0.81, BUN 8, Potassium 4.2, Sodium 139, GFR 80 A complete set of results can be found in Results Review.   Recommendations:  No changes and encouraged to call if experiencing any fluid symptoms.   Follow-up plan: ICM clinic phone appointment on 03/22/2024.   91 day device clinic remote transmission 03/18/2024.     EP/Cardiology Office Visits:  Recall 09/20/2024 with Dr Stann Earnest.   Recall 10/27/2024 with Dr Alvenia Aus.    Recall 05/26/2025 with Dr Carolynne Citron or APP.    Copy of ICM check sent to Dr. Carolynne Citron.   3 month ICM trend: 02/03/2024.    12-14 Month ICM  trend:     Almyra Jain, RN 02/06/2024 10:11 AM

## 2024-03-18 ENCOUNTER — Ambulatory Visit

## 2024-03-18 ENCOUNTER — Ambulatory Visit: Payer: Self-pay | Admitting: Internal Medicine

## 2024-03-18 DIAGNOSIS — I420 Dilated cardiomyopathy: Secondary | ICD-10-CM

## 2024-03-18 LAB — CUP PACEART REMOTE DEVICE CHECK
Battery Remaining Longevity: 4 mo
Battery Voltage: 2.86 V
Brady Statistic RV Percent Paced: 0.03 %
Date Time Interrogation Session: 20250710091707
HighPow Impedance: 73 Ohm
Implantable Lead Connection Status: 753985
Implantable Lead Implant Date: 20140212
Implantable Lead Location: 753860
Implantable Lead Model: 6935
Implantable Pulse Generator Implant Date: 20140212
Lead Channel Impedance Value: 437 Ohm
Lead Channel Impedance Value: 456 Ohm
Lead Channel Pacing Threshold Amplitude: 0.625 V
Lead Channel Pacing Threshold Pulse Width: 0.4 ms
Lead Channel Sensing Intrinsic Amplitude: 6.375 mV
Lead Channel Sensing Intrinsic Amplitude: 6.375 mV
Lead Channel Setting Pacing Amplitude: 2.5 V
Lead Channel Setting Pacing Pulse Width: 0.4 ms
Lead Channel Setting Sensing Sensitivity: 0.3 mV
Zone Setting Status: 755011

## 2024-03-22 ENCOUNTER — Ambulatory Visit: Attending: Internal Medicine

## 2024-03-22 DIAGNOSIS — Z95810 Presence of automatic (implantable) cardiac defibrillator: Secondary | ICD-10-CM

## 2024-03-22 DIAGNOSIS — I5022 Chronic systolic (congestive) heart failure: Secondary | ICD-10-CM | POA: Diagnosis not present

## 2024-03-23 ENCOUNTER — Telehealth: Payer: Self-pay

## 2024-03-23 NOTE — Telephone Encounter (Signed)
 Remote ICM transmission received.  Attempted call to patient regarding ICM remote transmission and left detailed message per DPR.  Left ICM phone number and advised to return call for any fluid symptoms or questions. Next ICM remote transmission scheduled 03/29/2024.

## 2024-03-23 NOTE — Progress Notes (Signed)
 EPIC Encounter for ICM Monitoring  Patient Name: [name] is a 68 y.o. female Date: 03/23/2024 Primary Care Physican: [name], NP Primary Cardiologist: Delford Electrophysiologist: Waddell 04/02/2023 Weight: 166.4 lbs 05/08/2023 Weight: 163.8 lbs 06/24/2023 Weight: 166 lbs      10/28/2023 Office Weight: 171 lbs      11/18/2023 Weight: 164.5 lbs    01/01/2024 Weight: 164 lbs 02/06/2024 Weight: 159 lbs   Battery Longevity: 4 months                                            Attempted call to patient and unable to reach.  Left detailed message per DPR regarding transmission.  Transmission results reviewed.    Optivol thoracic impedance suggesting possible fluid accumulation starting 7/1.   Prescribed: Furosemide  40 mg Take 1 tablet (40 mg total) by mouth daily as needed for fluid or edema (Anytime you have any of the following symptoms: 1) 3 pound weight gain in 24 hours or 5 pounds in 1 week 2) shortness of breath, with or without a dry hacking cough 3) swelling in the hands, feet or stomach 4) if you have to sleep on extra pillows at night in order to breathe). Spironolactone  25 mg take 0.5 tablet (12.5 mg total)  by mouth daily.   Labs: 12/08/2023 Creatinine 0.59, BUN 7, Potassium 3.7, Sodium 144, GFR >60 10/28/2023 Creatinine 0.81, BUN 8, Potassium 4.2, Sodium 139, GFR 80 A complete set of results can be found in Results Review.   Recommendations:  Left voice mail with ICM number and encouraged to call if experiencing any fluid symptoms.   Follow-up plan: ICM clinic phone appointment on 03/29/2024 to recheck fluid levels.   91 day device clinic remote transmission 04/19/2024.     EP/Cardiology Office Visits:  Recall 09/20/2024 with Dr Delford.   Recall 10/27/2024 with Dr Darron.    Recall 05/26/2025 with Dr Waddell or APP.    Copy of ICM check sent to Dr. Waddell.   3 month ICM trend: 03/22/2024.    12-14 Month ICM trend:     [name], RN 03/23/2024 12:27 PM

## 2024-03-24 NOTE — Progress Notes (Signed)
 Spoke with patient and heart failure questions reviewed.  Transmission results reviewed.  Pt asymptomatic for fluid accumulation.  Advised to take Furosemide  .5 tablet daily x 2 days over the next 4 days.  She verbalized understanding and agreed. Will recheck fluid levels on 7/21.

## 2024-03-29 ENCOUNTER — Ambulatory Visit: Attending: Internal Medicine

## 2024-03-29 DIAGNOSIS — I5022 Chronic systolic (congestive) heart failure: Secondary | ICD-10-CM

## 2024-03-29 DIAGNOSIS — Z95810 Presence of automatic (implantable) cardiac defibrillator: Secondary | ICD-10-CM

## 2024-03-31 NOTE — Progress Notes (Signed)
 EPIC Encounter for ICM Monitoring  Patient Name: [name] is a 68 y.o. female Date: 03/31/2024 Primary Care Physican: Karen Rojelio Caldron, NP Primary Cardiologist: [name] Electrophysiologist: [name] 10/28/2023 Office Weight: 171 lbs      11/18/2023 Weight: 164.5 lbs    01/01/2024 Weight: 164 lbs 02/06/2024 Weight: 159 lbs   Battery Longevity: 5 months                                            Spoke with patient and heart failure questions reviewed.  Transmission results reviewed.  Pt asymptomatic for fluid accumulation.  Reports feeling well at this time and voices no complaints.     Optivol thoracic impedance suggesting fluid levels returned to normal 7/15 after taking Lasix  20 mg x 1 day and 40 mg x 1 day.   Prescribed: Furosemide  40 mg Take 1 tablet (40 mg total) by mouth daily as needed for fluid or edema (Anytime you have any of the following symptoms: 1) 3 pound weight gain in 24 hours or 5 pounds in 1 week 2) shortness of breath, with or without a dry hacking cough 3) swelling in the hands, feet or stomach 4) if you have to sleep on extra pillows at night in order to breathe). Spironolactone  25 mg take 0.5 tablet (12.5 mg total)  by mouth daily.   Labs: 12/08/2023 Creatinine 0.59, BUN 7, Potassium 3.7, Sodium 144, GFR >60 10/28/2023 Creatinine 0.81, BUN 8, Potassium 4.2, Sodium 139, GFR 80 A complete set of results can be found in Results Review.   Recommendations: No changes and encouraged to call if experiencing any fluid symptoms.   Follow-up plan: ICM clinic phone appointment on 04/26/2024.   91 day device clinic remote transmission 04/19/2024.     EP/Cardiology Office Visits:  Recall 09/20/2024 with Dr [name].   Recall 10/27/2024 with Dr [name].    Recall 05/26/2025 with Dr [name] or APP.    Copy of ICM check sent to Dr. Waddell.   3 month ICM trend: 03/29/2024.    12-14 Month ICM trend:     Karen GORMAN Garner, RN 03/31/2024 12:30 PM

## 2024-04-02 NOTE — Telephone Encounter (Signed)
 NP appt scheduled for 07/22/2024 at 2:15 PM with a 1:45 PM arrival time. Confirmed address. NP packet mailed. Informed referring.

## 2024-04-04 ENCOUNTER — Encounter (HOSPITAL_COMMUNITY): Payer: Self-pay

## 2024-04-04 ENCOUNTER — Emergency Department (HOSPITAL_COMMUNITY): Admission: EM | Admit: 2024-04-04 | Discharge: 2024-04-04 | Disposition: A | Discharge status: Home / Self Care

## 2024-04-04 ENCOUNTER — Other Ambulatory Visit: Payer: Self-pay

## 2024-04-04 DIAGNOSIS — Z7982 Long term (current) use of aspirin: Secondary | ICD-10-CM | POA: Insufficient documentation

## 2024-04-04 DIAGNOSIS — L0390 Cellulitis, unspecified: Secondary | ICD-10-CM

## 2024-04-04 DIAGNOSIS — L03811 Cellulitis of head [any part, except face]: Secondary | ICD-10-CM | POA: Insufficient documentation

## 2024-04-04 MED ORDER — MUPIROCIN CALCIUM 2 % EX CREA: 1.0000 | TOPICAL_CREAM | Freq: Two times a day (BID) | CUTANEOUS | 0 refills | Status: AC

## 2024-04-04 NOTE — Discharge Instructions (Addendum)
 Today you were seen for a rash, I suspect this is likely from the glue used on your scalp for adhering the cap.  Please pick up your Bactroban  and apply to the wound areas as needed.  Please follow-up with your PCP if your symptoms persist for further evaluation workup.  Please return to the ED if you have red streaking on your scalp, uncontrollable vomiting, or fever that does not go down with Tylenol  or Motrin .  Thank you for letting us  treat you today. After forming a physical exam, I feel you are safe to go home. Please follow up with your PCP in the next several days and provide them with your records from this visit. Return to the Emergency Room if pain becomes severe or symptoms worsen.

## 2024-04-04 NOTE — ED Provider Notes (Signed)
 [place] EMERGENCY DEPARTMENT AT [facility] Provider Note   CSN: [id number] Arrival date & time: 04/04/24  9068     Patient presents with: Sore   [name] is a 68 y.o. female presents today for wounds on her scalp x wk.  Patient reports pruritus and pain.  Patient denies any recent new hair care products, however patient recently had a hair Placed with glue and she noticed the areas after she remove the cap.  Patient denies wounds anywhere else, fever, chills, nausea, vomiting, chest pain, shortness of breath, or any other complaints at this time.   HPI     Prior to Admission medications   Medication Sig Start Date End Date Taking? Authorizing Provider  mupirocin  cream (BACTROBAN ) 2 % Apply 1 Application topically 2 (two) times daily. 04/04/24  Yes Collyn Ribas N, PA-C  albuterol  (PROAIR  HFA) 108 (90 Base) MCG/ACT inhaler Inhale 2 puffs into the lungs every 6 (six) hours as needed for wheezing or shortness of breath. 03/28/20   Geronimo Amel, MD  alendronate (FOSAMAX) 70 MG tablet Take 70 mg by mouth once a week. 09/22/23   [provider]  aspirin  EC 81 MG tablet Take 81 mg by mouth daily.    [provider]  atorvastatin  (LIPITOR) 40 MG tablet TAKE 1 TABLET BY MOUTH EVERY DAY 11/17/23   Darron Deatrice LABOR, MD  carvedilol  (COREG ) 6.25 MG tablet TAKE 1 TABLET BY MOUTH 2 TIMES DAILY WITH A MEAL. 09/29/23   Nishan, Peter C, MD  feeding supplement, ENSURE COMPLETE, (ENSURE COMPLETE) LIQD Take 237 mLs by mouth 3 (three) times daily between meals. 05/19/14   [provider]  folic acid  (FOLVITE ) 1 MG tablet Take 1 tablet (1 mg total) by mouth daily. 02/10/22   Hongalgi, Trenda BIRCH, MD  furosemide  (LASIX ) 40 MG tablet Take 1 tablet (40 mg total) by mouth daily as needed for fluid or edema (Anytime you have any of the following symptoms: 1) 3 pound weight gain in 24 hours or 5 pounds in 1 week 2) shortness of breath, with or without a dry hacking cough 3)  swelling in the hands, feet or stomach 4) if you have to sleep on extra pillows at night in order to breathe.). 10/14/23   Nishan, Peter C, MD  levothyroxine  (SYNTHROID ) 75 MCG tablet Take 75 mcg by mouth every morning. 07/03/23   [provider]  Misc. Devices MISC Use scale daily to monitor weight. 01/10/22   [provider]  Multiple Vitamin (MULTIVITAMIN WITH MINERALS) TABS tablet Take 1 tablet by mouth daily. 02/10/22   Hongalgi, Anand D, MD  nitroGLYCERIN  (NITROSTAT ) 0.4 MG SL tablet Place 1 tablet (0.4 mg total) under the tongue every 5 (five) minutes as needed for chest pain (up to 3 doses). 11/03/17   Nishan, Peter C, MD  sacubitril -valsartan  (ENTRESTO ) 24-26 MG TAKE 1 TABLET BY MOUTH TWICE A DAY 10/01/23   Nishan, Peter C, MD  spironolactone  (ALDACTONE ) 25 MG tablet TAKE 1/2 TABLET BY MOUTH EVERY DAY 11/17/23   Nishan, Peter C, MD  thiamine  100 MG tablet Take 1 tablet (100 mg total) by mouth daily. 02/10/22   Hongalgi, Anand D, MD    Allergies: Clopidogrel bisulfate    Review of Systems  Skin:  Positive for rash.    Updated Vital Signs BP (!) 156/81 (BP Location: Left Arm)   Pulse 93   Temp 98.3 F (36.8 C) (Oral)   Resp 14   SpO2 100%  Physical Exam Vitals and nursing note reviewed.  Constitutional:      General: She is not in acute distress.    Appearance: Normal appearance. She is well-developed. She is not ill-appearing.  HENT:     Head: Normocephalic and atraumatic.     Right Ear: External ear normal.     Left Ear: External ear normal.  Eyes:     Conjunctiva/sclera: Conjunctivae normal.  Cardiovascular:     Rate and Rhythm: Normal rate and regular rhythm.     Heart sounds: No murmur heard. Pulmonary:     Effort: Pulmonary effort is normal. No respiratory distress.     Breath sounds: Normal breath sounds.  Abdominal:     Palpations: Abdomen is soft.     Tenderness: There is no abdominal tenderness.  Musculoskeletal:        General: No swelling.      Cervical back: Neck supple.  Skin:    General: Skin is warm and dry.     Capillary Refill: Capillary refill takes less than 2 seconds.     Comments: Patient with mildly erythematous and scaling plaques/ patches rash/wounds on various areas of the scalp.  1 with intact bulla.  No warmth or purulent drainage on exam.  Neurological:     Mental Status: She is alert.  Psychiatric:        Mood and Affect: Mood normal.     (all labs ordered are listed, but only abnormal results are displayed) Labs Reviewed - No data to display  EKG: None  Radiology: No results found.   Procedures   Medications Ordered in the ED - No data to display                                  Medical Decision Making  This patient presents to the ED for concern of rash/wound differential diagnosis includes contact dermatitis, cellulitis, fungal infection   Problem List / ED Course:  Considered for admission or further workup however patient's vital signs and physical exam are reassuring.  Patient symptoms likely due to wait Glue.  Patient given topical Bactroban  for any potential concurrent superficial infection coverage.  Patient to follow-up with her PCP if her symptoms persist.  Patient given return precautions.  I feel patient is safe for discharge at this time.       Final diagnoses:  Wound cellulitis    ED Discharge Orders          Ordered    mupirocin  cream (BACTROBAN ) 2 %  2 times daily        04/04/24 0956               Francis Ileana SAILOR, PA-C 04/04/24 9041    Neysa Caron PARAS, DO 04/04/24 1601

## 2024-04-04 NOTE — ED Triage Notes (Addendum)
 Pt c.o sores on her scalp, pt seen by PCP and told to come here. Pt c.o itching and pain. Denies any recent new hair care products but states she recently had a new hair cap placed with glue and she noticed the areas after she removed the cap

## 2024-04-09 ENCOUNTER — Other Ambulatory Visit: Payer: Self-pay | Admitting: Cardiovascular Disease

## 2024-04-19 ENCOUNTER — Ambulatory Visit

## 2024-04-20 LAB — CUP PACEART REMOTE DEVICE CHECK
Battery Remaining Longevity: 5 mo
Battery Voltage: 2.86 V
Brady Statistic RV Percent Paced: 0.08 %
Date Time Interrogation Session: 20250811043827
HighPow Impedance: 74 Ohm
Implantable Lead Connection Status: 753985
Implantable Lead Implant Date: 20140212
Implantable Lead Location: 753860
Implantable Lead Model: 6935
Implantable Pulse Generator Implant Date: 20140212
Lead Channel Impedance Value: 399 Ohm
Lead Channel Impedance Value: 456 Ohm
Lead Channel Pacing Threshold Amplitude: 0.625 V
Lead Channel Pacing Threshold Pulse Width: 0.4 ms
Lead Channel Sensing Intrinsic Amplitude: 11.625 mV
Lead Channel Sensing Intrinsic Amplitude: 11.625 mV
Lead Channel Setting Pacing Amplitude: 2.5 V
Lead Channel Setting Pacing Pulse Width: 0.4 ms
Lead Channel Setting Sensing Sensitivity: 0.3 mV
Zone Setting Status: 755011

## 2024-04-21 ENCOUNTER — Ambulatory Visit: Payer: Self-pay | Admitting: Internal Medicine

## 2024-04-26 ENCOUNTER — Ambulatory Visit: Attending: Internal Medicine

## 2024-04-26 DIAGNOSIS — Z95810 Presence of automatic (implantable) cardiac defibrillator: Secondary | ICD-10-CM | POA: Diagnosis not present

## 2024-04-26 DIAGNOSIS — I5022 Chronic systolic (congestive) heart failure: Secondary | ICD-10-CM | POA: Diagnosis not present

## 2024-04-27 NOTE — Progress Notes (Signed)
 EPIC Encounter for ICM Monitoring  Patient Name: [name] is a 68 y.o. female Date: 04/27/2024 Primary Care Physican: [name], NP Primary Cardiologist: Delford Electrophysiologist: Waddell 10/28/2023 Office Weight: 171 lbs      11/18/2023 Weight: 164.5 lbs    01/01/2024 Weight: 164 lbs 02/06/2024 Weight: 159 lbs 04/27/2024 Weight: 160 lbs   Battery Longevity: 6 months                                            Spoke with patient and heart failure questions reviewed.  Transmission results reviewed.  Pt asymptomatic for fluid accumulation.  Reports feeling well at this time and voices no complaints.     Optivol thoracic impedance suggesting fluid levels since 8/9.   Prescribed: Furosemide  40 mg Take 1 tablet (40 mg total) by mouth daily as needed for fluid or edema (Anytime you have any of the following symptoms: 1) 3 pound weight gain in 24 hours or 5 pounds in 1 week 2) shortness of breath, with or without a dry hacking cough 3) swelling in the hands, feet or stomach 4) if you have to sleep on extra pillows at night in order to breathe). Spironolactone  25 mg take 0.5 tablet (12.5 mg total)  by mouth daily.   Labs: 12/08/2023 Creatinine 0.59, BUN 7, Potassium 3.7, Sodium 144, GFR >60 10/28/2023 Creatinine 0.81, BUN 8, Potassium 4.2, Sodium 139, GFR 80 A complete set of results can be found in Results Review.   Recommendations: No changes and encouraged to call if experiencing any fluid symptoms.   Follow-up plan: ICM clinic phone appointment on 05/31/2024.   91 day device clinic remote transmission 06/21/10/2025.     EP/Cardiology Office Visits:  Recall 09/20/2024 with Dr Delford.   Recall 10/27/2024 with Dr Darron.    Recall 05/26/2025 with Dr Waddell or APP.    Copy of ICM check sent to Dr. Waddell.   3 month ICM trend: 04/26/2024.    12-14 Month ICM trend:     Karen GORMAN Garner, RN 04/27/2024 2:40 PM

## 2024-04-30 ENCOUNTER — Telehealth: Payer: Self-pay | Admitting: Internal Medicine

## 2024-04-30 NOTE — Telephone Encounter (Signed)
 Scheduled appointments per patients request via left VM from the patient. Talked with the patient and she is aware of the made appointments.

## 2024-05-20 ENCOUNTER — Ambulatory Visit

## 2024-05-21 LAB — CUP PACEART REMOTE DEVICE CHECK
Battery Remaining Longevity: 4 mo
Battery Voltage: 2.81 V
Brady Statistic RV Percent Paced: 0.06 %
Date Time Interrogation Session: 20250912114829
HighPow Impedance: 70 Ohm
Implantable Lead Connection Status: 753985
Implantable Lead Implant Date: 20140212
Implantable Lead Location: 753860
Implantable Lead Model: 6935
Implantable Pulse Generator Implant Date: 20140212
Lead Channel Impedance Value: 380 Ohm
Lead Channel Impedance Value: 456 Ohm
Lead Channel Pacing Threshold Amplitude: 0.625 V
Lead Channel Pacing Threshold Pulse Width: 0.4 ms
Lead Channel Sensing Intrinsic Amplitude: 6.875 mV
Lead Channel Sensing Intrinsic Amplitude: 6.875 mV
Lead Channel Setting Pacing Amplitude: 2.5 V
Lead Channel Setting Pacing Pulse Width: 0.4 ms
Lead Channel Setting Sensing Sensitivity: 0.3 mV
Zone Setting Status: 755011

## 2024-05-28 ENCOUNTER — Ambulatory Visit: Payer: Self-pay | Admitting: Internal Medicine

## 2024-05-31 ENCOUNTER — Ambulatory Visit: Attending: Internal Medicine

## 2024-05-31 DIAGNOSIS — Z95810 Presence of automatic (implantable) cardiac defibrillator: Secondary | ICD-10-CM | POA: Diagnosis not present

## 2024-05-31 DIAGNOSIS — I5022 Chronic systolic (congestive) heart failure: Secondary | ICD-10-CM | POA: Diagnosis not present

## 2024-05-31 NOTE — Progress Notes (Signed)
 EPIC Encounter for ICM Monitoring  Patient Name: [name] is a 68 y.o. female Date: 05/31/2024 Primary Care Physican: [name], NP Primary Cardiologist: Delford Electrophysiologist: Waddell 10/28/2023 Office Weight: 171 lbs      11/18/2023 Weight: 164.5 lbs    01/01/2024 Weight: 164 lbs 02/06/2024 Weight: 159 lbs 04/27/2024 Weight: 160 lbs   Battery Longevity: 4 months                                            Attempted call to patient and unable to reach.  Left detailed message per DPR regarding transmission.  Transmission results reviewed.    Optivol thoracic impedance suggesting possible fluid accumulation starting 9/12.   Prescribed: Furosemide  40 mg Take 1 tablet (40 mg total) by mouth daily as needed for fluid or edema (Anytime you have any of the following symptoms: 1) 3 pound weight gain in 24 hours or 5 pounds in 1 week 2) shortness of breath, with or without a dry hacking cough 3) swelling in the hands, feet or stomach 4) if you have to sleep on extra pillows at night in order to breathe). Spironolactone  25 mg take 0.5 tablet (12.5 mg total)  by mouth daily.   Labs: 12/08/2023 Creatinine 0.59, BUN 7, Potassium 3.7, Sodium 144, GFR >60 10/28/2023 Creatinine 0.81, BUN 8, Potassium 4.2, Sodium 139, GFR 80 A complete set of results can be found in Results Review.   Recommendations:  Left voice mail with ICM number and encouraged to call if experiencing any fluid symptoms.  Will advise patient to take PRN Lasix  if reached.     Follow-up plan: ICM clinic phone appointment on 06/11/2024 to recheck fluid levels.   91 day device clinic remote transmission 06/21/2024.     EP/Cardiology Office Visits:  Recall 09/20/2024 with Dr Delford.   Recall 10/27/2024 with Dr Darron.    Recall 05/26/2025 with Dr Waddell or APP.    Copy of ICM check sent to Dr. Waddell.   3 month ICM trend: 05/31/2024.    12-14 Month ICM trend:     [name], RN 05/31/2024 1:59 PM

## 2024-06-01 ENCOUNTER — Inpatient Hospital Stay: Attending: Internal Medicine

## 2024-06-01 ENCOUNTER — Inpatient Hospital Stay (HOSPITAL_BASED_OUTPATIENT_CLINIC_OR_DEPARTMENT_OTHER): Admitting: Internal Medicine

## 2024-06-01 DIAGNOSIS — D649 Anemia, unspecified: Secondary | ICD-10-CM | POA: Diagnosis not present

## 2024-06-01 DIAGNOSIS — D6959 Other secondary thrombocytopenia: Secondary | ICD-10-CM | POA: Insufficient documentation

## 2024-06-01 LAB — CMP (CANCER CENTER ONLY)
ALT: 50 U/L — ABNORMAL HIGH (ref 0–44)
AST: 111 U/L — ABNORMAL HIGH (ref 15–41)
Albumin: 3.2 g/dL — ABNORMAL LOW (ref 3.5–5.0)
Alkaline Phosphatase: 210 U/L — ABNORMAL HIGH (ref 38–126)
Anion gap: 5 (ref 5–15)
BUN: 11 mg/dL (ref 8–23)
CO2: 23 mmol/L (ref 22–32)
Calcium: 8.7 mg/dL — ABNORMAL LOW (ref 8.9–10.3)
Chloride: 109 mmol/L (ref 98–111)
Creatinine: 0.87 mg/dL (ref 0.44–1.00)
GFR, Estimated: >60 mL/min (ref >60)
Glucose, Bld: 119 mg/dL — ABNORMAL HIGH (ref 70–99)
Potassium: 4.8 mmol/L (ref 3.5–5.1)
Sodium: 137 mmol/L (ref 135–145)
Total Bilirubin: 0.9 mg/dL (ref 0.0–1.2)
Total Protein: 7.2 g/dL (ref 6.5–8.1)

## 2024-06-01 LAB — CBC WITH DIFFERENTIAL (CANCER CENTER ONLY)
Abs Immature Granulocytes: 0.01 K/uL (ref 0.00–0.07)
Basophils Absolute: 0 K/uL (ref 0.0–0.1)
Basophils Relative: 1 %
Eosinophils Absolute: 0.4 K/uL (ref 0.0–0.5)
Eosinophils Relative: 8 %
HCT: 31.1 % — ABNORMAL LOW (ref 36.0–46.0)
Hemoglobin: 10.7 g/dL — ABNORMAL LOW (ref 12.0–15.0)
Immature Granulocytes: 0 %
Lymphocytes Relative: 59 %
Lymphs Abs: 3 K/uL (ref 0.7–4.0)
MCH: 32.2 pg (ref 26.0–34.0)
MCHC: 34.4 g/dL (ref 30.0–36.0)
MCV: 93.7 fL (ref 80.0–100.0)
Monocytes Absolute: 0.5 K/uL (ref 0.1–1.0)
Monocytes Relative: 9 %
Neutro Abs: 1.2 K/uL — ABNORMAL LOW (ref 1.7–7.7)
Neutrophils Relative %: 23 %
Platelet Count: 41 K/uL — ABNORMAL LOW (ref 150–400)
RBC: 3.32 MIL/uL — ABNORMAL LOW (ref 3.87–5.11)
RDW: 13.9 % (ref 11.5–15.5)
WBC Count: 5.1 K/uL (ref 4.0–10.5)
nRBC: 0 % (ref 0.0–0.2)

## 2024-06-01 LAB — IRON AND IRON BINDING CAPACITY (CC-WL,HP ONLY)
Iron: 181 ug/dL — ABNORMAL HIGH (ref 28–170)
Saturation Ratios: 86 % — ABNORMAL HIGH (ref 10.4–31.8)
TIBC: 211 ug/dL — ABNORMAL LOW (ref 250–450)
UIBC: 30 ug/dL — ABNORMAL LOW (ref 148–442)

## 2024-06-01 LAB — FOLATE: Folate: 16 ng/mL (ref >5.9)

## 2024-06-01 LAB — FERRITIN: Ferritin: 2369 ng/mL — ABNORMAL HIGH (ref 11–307)

## 2024-06-01 LAB — VITAMIN B12: Vitamin B-12: 466 pg/mL (ref 180–914)

## 2024-06-01 NOTE — Progress Notes (Signed)
 [facility] [phone]   [phone]  OFFICE PROGRESS NOTE  Karen Rojelio Caldron, NP [address] [place] [address]  DIAGNOSIS:  Iron overload with significant elevation of her ferritin level as well as serum iron and iron saturation.  The patient has multiple medical problems including congestive heart failure as well as COPD and alcohol abuse.  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 The patient also has elevated AFP and liver cirrhosis.  PRIOR THERAPY: None  CURRENT THERAPY: Observation  INTERVAL HISTORY: [name] 68 y.o. female returns to the clinic today for follow-up visit.Discussed the use of AI scribe software for clinical note transcription with the patient, who gave verbal consent to proceed.  History of Present Illness [name] is a 68 year old female with iron overload and hemochromatosis who presents for evaluation and repeat blood work.  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 She is currently under observation for this condition. No new symptoms or complaints have been reported since her last visit. No bleeding issues or bruising have been reported.  Her liver enzymes are elevated, with AST at one hundred eleven, ALT at fifty, and alkaline phosphatase at two hundred ten. She consumes alcohol regularly, approximately one to two 24-ounce cans daily, and about two cans over the weekend. She does not have a liver doctor and has not yet seen a specialist for her liver issues.  No chest pain, breathing issues, diarrhea, or constipation. She reports feeling fine overall.    MEDICAL HISTORY: Past Medical History:  Diagnosis Date   Acute on chronic systolic heart failure (HCC)    Acute respiratory failure (HCC)    Automatic  implantable cardioverter-defibrillator in situ    CAD (coronary artery disease)    a. Inf-post MI 2008 s/p BMS to large marginal of Cx.    COPD (chronic obstructive pulmonary disease) (HCC)    ETOH abuse    Hyperglycemia    HYPERLIPIDEMIA-MIXED 02/07/2009   HYPERTENSION, BENIGN 08/22/2009   PVD (peripheral vascular disease)    a. Evaluated by Dr. Darron 08/2012.   SYSTOLIC HEART FAILURE, CHRONIC 02/07/2009   a. EF 30% 2010, 29% by MRI 08/2012. b. s/p prophylactic Medtronic ICD implantation 10/2012.   TIA (transient ischemic attack) 05/18/2014   Tobacco abuse    VENTRICULAR TACHYCARDIA 05/21/2010    ALLERGIES:  is allergic to clopidogrel bisulfate.  MEDICATIONS:  Current Outpatient Medications  Medication Sig Dispense Refill   albuterol  (PROAIR  HFA) 108 (90 Base) MCG/ACT inhaler Inhale 2 puffs into the lungs every 6 (six) hours as needed for wheezing or shortness of breath. 18 g 5   alendronate (FOSAMAX) 70 MG tablet Take 70 mg by mouth once a week.     aspirin  EC 81 MG tablet Take 81 mg by mouth daily.     atorvastatin  (LIPITOR) 40 MG tablet TAKE 1 TABLET BY MOUTH EVERY DAY 90 tablet 1   carvedilol  (COREG ) 6.25 MG tablet TAKE 1 TABLET BY MOUTH 2 TIMES DAILY WITH A MEAL. 180 tablet 3   feeding supplement, ENSURE COMPLETE, (ENSURE COMPLETE) LIQD Take 237 mLs by mouth 3 (three) times daily between meals.     folic acid  (FOLVITE ) 1 MG tablet Take 1 tablet (1 mg total) by mouth daily. 30 tablet 0   furosemide  (LASIX ) 40  MG tablet Take 1 tablet (40 mg total) by mouth daily as needed for fluid or edema (Anytime you have any of the following symptoms: 1) 3 pound weight gain in 24 hours or 5 pounds in 1 week 2) shortness of breath, with or without a dry hacking cough 3) swelling in the hands, feet or stomach 4) if you have to sleep on extra pillows at night in order to breathe.). 90 tablet 3   levothyroxine  (SYNTHROID ) 100 MCG tablet Take 100 mcg by mouth every morning.     levothyroxine  (SYNTHROID ) 75  MCG tablet Take 75 mcg by mouth every morning.     Misc. Devices MISC Use scale daily to monitor weight.     Multiple Vitamin (MULTIVITAMIN WITH MINERALS) TABS tablet Take 1 tablet by mouth daily.     mupirocin  cream (BACTROBAN ) 2 % Apply 1 Application topically 2 (two) times daily. 15 g 0   nitroGLYCERIN  (NITROSTAT ) 0.4 MG SL tablet Place 1 tablet (0.4 mg total) under the tongue every 5 (five) minutes as needed for chest pain (up to 3 doses). 25 tablet 4   sacubitril -valsartan  (ENTRESTO ) 24-26 MG TAKE 1 TABLET BY MOUTH TWICE A DAY 180 tablet 3   spironolactone  (ALDACTONE ) 25 MG tablet TAKE 1/2 TABLET BY MOUTH EVERY DAY 45 tablet 3   thiamine  100 MG tablet Take 1 tablet (100 mg total) by mouth daily. 30 tablet 0   No current facility-administered medications for this visit.    SURGICAL HISTORY:  Past Surgical History:  Procedure Laterality Date   CARDIAC DEFIBRILLATOR PLACEMENT  10/2012   CESAREAN SECTION  1977; 1982   COLONOSCOPY     CYSTOSCOPY W/ STONE MANIPULATION  1990's   ESOPHAGOGASTRODUODENOSCOPY (EGD) WITH PROPOFOL  N/A 08/04/2018   Procedure: ESOPHAGOGASTRODUODENOSCOPY (EGD) WITH PROPOFOL ;  Surgeon: Avram Lupita BRAVO, MD;  Location: [facility] ENDOSCOPY;  Service: Endoscopy;  Laterality: N/A;   IMPLANTABLE CARDIOVERTER DEFIBRILLATOR IMPLANT N/A 10/21/2012   Procedure: IMPLANTABLE CARDIOVERTER DEFIBRILLATOR IMPLANT;  Surgeon: Danelle LELON Birmingham, MD;  Location: [facility] CATH LAB;  Service: Cardiovascular;  Laterality: N/A;   RIGHT/LEFT HEART CATH AND CORONARY ANGIOGRAPHY N/A 03/26/2018   Procedure: RIGHT/LEFT HEART CATH AND CORONARY ANGIOGRAPHY;  Surgeon: Mady Bruckner, MD;  Location: [facility] INVASIVE CV LAB;  Service: Cardiovascular;  Laterality: N/A;    REVIEW OF SYSTEMS:  A comprehensive review of systems was negative except for: Constitutional: positive for fatigue   PHYSICAL EXAMINATION: General appearance: alert, cooperative, and no distress Head: Normocephalic, without obvious abnormality,  atraumatic Neck: no adenopathy, no JVD, supple, symmetrical, trachea midline, and thyroid not enlarged, symmetric, no tenderness/mass/nodules Lymph nodes: Cervical, supraclavicular, and axillary nodes normal. Resp: clear to auscultation bilaterally Back: symmetric, no curvature. ROM normal. No CVA tenderness. Cardio: regular rate and rhythm, S1, S2 normal, no murmur, click, rub or gallop GI: soft, non-tender; bowel sounds normal; no masses,  no organomegaly Extremities: extremities normal, atraumatic, no cyanosis or edema  ECOG PERFORMANCE STATUS: 1 - Symptomatic but completely ambulatory  Blood pressure 123/81, pulse 72, temperature 98.2 F (36.8 C), temperature source Temporal, resp. rate 17, height 5' 9 (1.753 m), weight 160 lb (72.6 kg), SpO2 100%.  LABORATORY DATA: Lab Results  Component Value Date   WBC 5.1 06/01/2024   HGB 10.7 (L) 06/01/2024   HCT 31.1 (L) 06/01/2024   MCV 93.7 06/01/2024   PLT 41 (L) 06/01/2024      Chemistry      Component Value Date/Time   NA 137 06/01/2024 0950   NA 139 10/28/2023  1015   K 4.8 06/01/2024 0950   CL 109 06/01/2024 0950   CO2 23 06/01/2024 0950   BUN 11 06/01/2024 0950   BUN 8 10/28/2023 1015   CREATININE 0.87 06/01/2024 0950      Component Value Date/Time   CALCIUM  8.7 (L) 06/01/2024 0950   ALKPHOS 210 (H) 06/01/2024 0950   AST 111 (H) 06/01/2024 0950   ALT 50 (H) 06/01/2024 0950   BILITOT 0.9 06/01/2024 0950       RADIOGRAPHIC STUDIES: CUP PACEART REMOTE DEVICE CHECK Result Date: 05/21/2024 Monthly battery check.  Estimated 4 months Normal device function. No new alerts. On HF monitoring with abnormal HF. 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 Elevated liver enzymes (AST 111, ALT 50, alkaline phosphatase 210) suggest liver dysfunction. She reports consuming  approximately 24 ounces of alcohol daily, with reduced intake over weekends. Continued alcohol consumption poses a high risk for liver failure. 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 No new symptoms such as bleeding or bruising reported. 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 Heterozygous H63D mutation present. Awaiting iron study results to determine further management. Phlebotomy not considered due to concurrent anemia. - Await iron study results to guide management  Anemia Anemia present, contributing to decision against phlebotomy for iron overload management. She was advised to call immediately if she has any other concerning symptoms in the interval.  The patient voices understanding of current disease status and treatment options and is in agreement with the current care plan.  All questions were answered. The patient knows to call the clinic with any problems, questions or concerns. We can certainly see the patient much sooner if necessary. The total time spent in the appointment was 30 minutes.  Disclaimer: This note was dictated with voice recognition software. Similar sounding words can inadvertently be transcribed and may not be corrected upon review.

## 2024-06-02 LAB — AFP TUMOR MARKER: AFP, Serum, Tumor Marker: 10.9 ng/mL — ABNORMAL HIGH (ref 0.0–9.2)

## 2024-06-07 ENCOUNTER — Ambulatory Visit: Attending: Internal Medicine

## 2024-06-07 DIAGNOSIS — Z95810 Presence of automatic (implantable) cardiac defibrillator: Secondary | ICD-10-CM

## 2024-06-07 DIAGNOSIS — I5022 Chronic systolic (congestive) heart failure: Secondary | ICD-10-CM

## 2024-06-07 NOTE — Progress Notes (Signed)
 EPIC Encounter for ICM Monitoring  Patient Name: [name] is a 68 y.o. female Date: 06/07/2024 Primary Care Physican: [name], NP Primary Cardiologist: Delford Electrophysiologist: Waddell 10/28/2023 Office Weight: 171 lbs      11/18/2023 Weight: 164.5 lbs    01/01/2024 Weight: 164 lbs 02/06/2024 Weight: 159 lbs 04/27/2024 Weight: 160 lbs 06/07/2024 Weight: 155-160 lbs   Battery Longevity: 4 months                                            Spoke with patient and heart failure questions reviewed.  Transmission results reviewed.  Pt asymptomatic for fluid accumulation.  Reports feeling well at this time and voices no complaints.  Unsure what is causing possible fluid accumulation.  Usually cooks at home.     Optivol thoracic impedance suggesting possible fluid accumulation starting 05/21/2024.   Prescribed: Furosemide  40 mg Take 1 tablet (40 mg total) by mouth daily as needed for fluid or edema (Anytime you have any of the following symptoms: 1) 3 pound weight gain in 24 hours or 5 pounds in 1 week 2) shortness of breath, with or without a dry hacking cough 3) swelling in the hands, feet or stomach 4) if you have to sleep on extra pillows at night in order to breathe). Spironolactone  25 mg take 0.5 tablet (12.5 mg total)  by mouth daily.   Labs: 12/08/2023 Creatinine 0.59, BUN 7, Potassium 3.7, Sodium 144, GFR >60 10/28/2023 Creatinine 0.81, BUN 8, Potassium 4.2, Sodium 139, GFR 80 A complete set of results can be found in Results Review.   Recommendations: She will take PRN Lasix  1 tablet x 3 days and return to PRN.  Encouraged to limit salt and fluid intake.  Copy sent to Dr Delford as RICK and review.   Follow-up plan: ICM clinic phone appointment on 06/10/2024 to recheck fluid levels.   91 day device clinic remote transmission 06/21/2024.     EP/Cardiology Office Visits:  Recall 09/20/2024 with Dr Delford.   Recall 10/27/2024 with Dr Darron.    Recall 05/26/2025 with Dr Waddell or  APP.    Copy of ICM check sent to Dr. Waddell.  Remote monitoring is medically necessary for Heart Failure Management.    90 day Daily Thoracic Impedance ICM trend: 03/08/2024 through 06/07/2024.    12-14 Month Thoracic Impedance ICM trend:     [name], RN 06/07/2024 1:33 PM

## 2024-06-08 NOTE — Progress Notes (Signed)
  Received: Nilsa Delford Maude JAYSON, MD  Mystie Ormand, Mitzie RAMAN, RN Would continue aldactone  and PRN lasix

## 2024-06-09 ENCOUNTER — Ambulatory Visit: Attending: Internal Medicine

## 2024-06-09 DIAGNOSIS — Z95810 Presence of automatic (implantable) cardiac defibrillator: Secondary | ICD-10-CM

## 2024-06-09 DIAGNOSIS — I5022 Chronic systolic (congestive) heart failure: Secondary | ICD-10-CM

## 2024-06-10 ENCOUNTER — Telehealth: Payer: Self-pay

## 2024-06-10 NOTE — Telephone Encounter (Signed)
 Attempted ICM call to request for manual transmission to recheck fluid levels and no answer.  ICM remote transmission rescheduled for 06/21/2024.

## 2024-06-10 NOTE — Progress Notes (Signed)
 EPIC Encounter for ICM Monitoring  Patient Name: [name] is a 68 y.o. female Date: 06/10/2024 Primary Care Physican: [name], NP Primary Cardiologist: Delford Electrophysiologist: Waddell 10/28/2023 Office Weight: 171 lbs      11/18/2023 Weight: 164.5 lbs    01/01/2024 Weight: 164 lbs 02/06/2024 Weight: 159 lbs 04/27/2024 Weight: 160 lbs 06/07/2024 Weight: 155-160 lbs 06/10/2024 Weight: 159 lbs   Battery Longevity: 4 months                                            Spoke with patient and heart failure questions reviewed.  Transmission results reviewed.  Pt asymptomatic for fluid accumulation.  Reports feeling well at this time and voices no complaints.  Unsure what is causing possible fluid accumulation.  Usually cooks at home.     Optivol thoracic impedance suggesting fluid levels have returned to normal after taking PRN Lasix  x 3 days and then took another one day today.     Prescribed: Furosemide  40 mg Take 1 tablet (40 mg total) by mouth daily as needed for fluid or edema (Anytime you have any of the following symptoms: 1) 3 pound weight gain in 24 hours or 5 pounds in 1 week 2) shortness of breath, with or without a dry hacking cough 3) swelling in the hands, feet or stomach 4) if you have to sleep on extra pillows at night in order to breathe). Spironolactone  25 mg take 0.5 tablet (12.5 mg total)  by mouth daily.   Labs: 12/08/2023 Creatinine 0.59, BUN 7, Potassium 3.7, Sodium 144, GFR >60 10/28/2023 Creatinine 0.81, BUN 8, Potassium 4.2, Sodium 139, GFR 80 A complete set of results can be found in Results Review.   Recommendations: No changes and encouraged to call if experiencing any fluid symptoms.   Follow-up plan: ICM clinic phone appointment on 07/12/2024.   91 day device clinic remote transmission 06/21/2024.     EP/Cardiology Office Visits:  Recall 09/20/2024 with Dr Delford.   Recall 10/27/2024 with Dr Darron.    Recall 05/26/2025 with Dr Waddell or APP.    Copy  of ICM check sent to Dr. Waddell.    Remote monitoring is medically necessary for Heart Failure Management.    90 day Daily Thoracic Impedance ICM trend: 03/11/2024 through 06/10/2024.    12-14 Month Thoracic Impedance ICM trend:     [name], RN 06/10/2024 4:43 PM

## 2024-06-11 ENCOUNTER — Encounter

## 2024-06-18 NOTE — Progress Notes (Signed)
 Remote ICD Transmission

## 2024-06-21 ENCOUNTER — Ambulatory Visit (INDEPENDENT_AMBULATORY_CARE_PROVIDER_SITE_OTHER)

## 2024-06-21 DIAGNOSIS — I5022 Chronic systolic (congestive) heart failure: Secondary | ICD-10-CM | POA: Diagnosis not present

## 2024-06-21 LAB — CUP PACEART REMOTE DEVICE CHECK
Battery Remaining Longevity: 2 mo
Battery Voltage: 2.84 V
Brady Statistic RV Percent Paced: 0.12 %
Date Time Interrogation Session: 20251013033527
HighPow Impedance: 71 Ohm
Implantable Lead Connection Status: 753985
Implantable Lead Implant Date: 20140212
Implantable Lead Location: 753860
Implantable Lead Model: 6935
Implantable Pulse Generator Implant Date: 20140212
Lead Channel Impedance Value: 437 Ohm
Lead Channel Impedance Value: 494 Ohm
Lead Channel Pacing Threshold Amplitude: 0.625 V
Lead Channel Pacing Threshold Pulse Width: 0.4 ms
Lead Channel Sensing Intrinsic Amplitude: 8.25 mV
Lead Channel Sensing Intrinsic Amplitude: 8.25 mV
Lead Channel Setting Pacing Amplitude: 2.5 V
Lead Channel Setting Pacing Pulse Width: 0.4 ms
Lead Channel Setting Sensing Sensitivity: 0.3 mV
Zone Setting Status: 755011

## 2024-06-23 NOTE — Progress Notes (Signed)
 Remote ICD Transmission

## 2024-06-24 ENCOUNTER — Ambulatory Visit: Payer: Self-pay | Admitting: Internal Medicine

## 2024-07-12 ENCOUNTER — Ambulatory Visit: Attending: Internal Medicine

## 2024-07-12 DIAGNOSIS — Z95810 Presence of automatic (implantable) cardiac defibrillator: Secondary | ICD-10-CM | POA: Diagnosis not present

## 2024-07-12 DIAGNOSIS — I5022 Chronic systolic (congestive) heart failure: Secondary | ICD-10-CM

## 2024-07-16 NOTE — Progress Notes (Signed)
 EPIC Encounter for ICM Monitoring  Patient Name: [name] is a 68 y.o. female Date: 07/16/2024 Primary Care Physican: [name], NP Primary Cardiologist: Delford Electrophysiologist: Waddell 10/28/2023 Office Weight: 171 lbs      11/18/2023 Weight: 164.5 lbs    01/01/2024 Weight: 164 lbs 02/06/2024 Weight: 159 lbs 04/27/2024 Weight: 160 lbs 06/07/2024 Weight: 155-160 lbs 06/10/2024 Weight: 159 lbs 07/16/2024 Weight: 153 lbs   Battery Longevity: 3 months                                            Spoke with patient and heart failure questions reviewed.  Transmission results reviewed.  Pt asymptomatic for fluid accumulation.  Takes PRN Lasix  for fluid symptoms.   Since 06/09/2024 ICM Remote Transmission: Optivol thoracic impedance suggesting normal normal fluid levels with the exception of possible fluid accumulation from 06/10/2024-10/07-2024 and 07/04/2024-07/07/2024.     Prescribed: Furosemide  40 mg Take 1 tablet (40 mg total) by mouth daily as needed for fluid or edema (Anytime you have any of the following symptoms: 1) 3 pound weight gain in 24 hours or 5 pounds in 1 week 2) shortness of breath, with or without a dry hacking cough 3) swelling in the hands, feet or stomach 4) if you have to sleep on extra pillows at night in order to breathe). Spironolactone  25 mg take 0.5 tablet (12.5 mg total)  by mouth daily.   Labs: 12/08/2023 Creatinine 0.59, BUN 7, Potassium 3.7, Sodium 144, GFR >60 10/28/2023 Creatinine 0.81, BUN 8, Potassium 4.2, Sodium 139, GFR 80 A complete set of results can be found in Results Review.   Recommendations:  No changes and encouraged to call if experiencing any fluid symptoms.   Follow-up plan: ICM clinic phone appointment on 08/23/2024.   91 day device clinic remote transmission 09/22/2024.     EP/Cardiology Office Visits:  Recall 09/20/2024 with Dr Delford.   Recall 10/27/2024 with Dr Darron.    Recall 05/26/2025 with Dr Waddell or APP.    Copy of ICM  check sent to Dr. Waddell.    Remote monitoring is medically necessary for Heart Failure Management.    Daily Thoracic Impedance ICM trend: 04/12/2024 through 07/12/2024.    12-14 Month Thoracic Impedance ICM trend:     [name], RN 07/16/2024 1:52 PM

## 2024-07-22 ENCOUNTER — Encounter

## 2024-07-22 ENCOUNTER — Other Ambulatory Visit: Payer: Self-pay | Admitting: Nurse Practitioner

## 2024-07-22 ENCOUNTER — Ambulatory Visit: Attending: Internal Medicine

## 2024-07-22 DIAGNOSIS — K7469 Other cirrhosis of liver: Secondary | ICD-10-CM

## 2024-07-22 DIAGNOSIS — R748 Abnormal levels of other serum enzymes: Secondary | ICD-10-CM

## 2024-07-23 LAB — CUP PACEART REMOTE DEVICE CHECK
Battery Remaining Longevity: 3 mo
Battery Voltage: 2.84 V
Brady Statistic RV Percent Paced: 0.09 %
Date Time Interrogation Session: 20251113063429
HighPow Impedance: 71 Ohm
Implantable Lead Connection Status: 753985
Implantable Lead Implant Date: 20140212
Implantable Lead Location: 753860
Implantable Lead Model: 6935
Implantable Pulse Generator Implant Date: 20140212
Lead Channel Impedance Value: 437 Ohm
Lead Channel Impedance Value: 494 Ohm
Lead Channel Pacing Threshold Amplitude: 0.625 V
Lead Channel Pacing Threshold Pulse Width: 0.4 ms
Lead Channel Sensing Intrinsic Amplitude: 6.5 mV
Lead Channel Sensing Intrinsic Amplitude: 6.5 mV
Lead Channel Setting Pacing Amplitude: 2.5 V
Lead Channel Setting Pacing Pulse Width: 0.4 ms
Lead Channel Setting Sensing Sensitivity: 0.3 mV
Zone Setting Status: 755011

## 2024-07-25 ENCOUNTER — Ambulatory Visit: Payer: Self-pay | Admitting: Internal Medicine

## 2024-07-27 ENCOUNTER — Other Ambulatory Visit (HOSPITAL_COMMUNITY): Payer: Self-pay | Admitting: Nurse Practitioner

## 2024-07-27 DIAGNOSIS — K7469 Other cirrhosis of liver: Secondary | ICD-10-CM

## 2024-07-27 DIAGNOSIS — R748 Abnormal levels of other serum enzymes: Secondary | ICD-10-CM

## 2024-08-04 ENCOUNTER — Encounter (HOSPITAL_COMMUNITY): Payer: Self-pay | Admitting: Emergency Medicine

## 2024-08-04 ENCOUNTER — Other Ambulatory Visit: Payer: Self-pay

## 2024-08-04 ENCOUNTER — Emergency Department (HOSPITAL_COMMUNITY)
Admission: EM | Admit: 2024-08-04 | Discharge: 2024-08-04 | Disposition: A | Discharge status: Home / Self Care | Attending: Emergency Medicine | Admitting: Emergency Medicine

## 2024-08-04 ENCOUNTER — Other Ambulatory Visit (HOSPITAL_COMMUNITY): Payer: Self-pay

## 2024-08-04 ENCOUNTER — Emergency Department (HOSPITAL_COMMUNITY)

## 2024-08-04 DIAGNOSIS — Z79899 Other long term (current) drug therapy: Secondary | ICD-10-CM | POA: Insufficient documentation

## 2024-08-04 DIAGNOSIS — I110 Hypertensive heart disease with heart failure: Secondary | ICD-10-CM | POA: Insufficient documentation

## 2024-08-04 DIAGNOSIS — J449 Chronic obstructive pulmonary disease, unspecified: Secondary | ICD-10-CM | POA: Diagnosis not present

## 2024-08-04 DIAGNOSIS — I2510 Atherosclerotic heart disease of native coronary artery without angina pectoris: Secondary | ICD-10-CM | POA: Insufficient documentation

## 2024-08-04 DIAGNOSIS — I509 Heart failure, unspecified: Secondary | ICD-10-CM | POA: Insufficient documentation

## 2024-08-04 DIAGNOSIS — J069 Acute upper respiratory infection, unspecified: Secondary | ICD-10-CM | POA: Diagnosis not present

## 2024-08-04 DIAGNOSIS — Z7982 Long term (current) use of aspirin: Secondary | ICD-10-CM | POA: Diagnosis not present

## 2024-08-04 DIAGNOSIS — R519 Headache, unspecified: Secondary | ICD-10-CM | POA: Diagnosis not present

## 2024-08-04 DIAGNOSIS — R059 Cough, unspecified: Secondary | ICD-10-CM | POA: Diagnosis present

## 2024-08-04 LAB — CBC WITH DIFFERENTIAL/PLATELET
Abs Immature Granulocytes: 0.01 K/uL (ref 0.00–0.07)
Basophils Absolute: 0 K/uL (ref 0.0–0.1)
Basophils Relative: 1 %
Eosinophils Absolute: 0.2 K/uL (ref 0.0–0.5)
Eosinophils Relative: 5 %
HCT: 31.3 % — ABNORMAL LOW (ref 36.0–46.0)
Hemoglobin: 10.1 g/dL — ABNORMAL LOW (ref 12.0–15.0)
Immature Granulocytes: 0 %
Lymphocytes Relative: 51 %
Lymphs Abs: 2.4 K/uL (ref 0.7–4.0)
MCH: 32.7 pg (ref 26.0–34.0)
MCHC: 32.3 g/dL (ref 30.0–36.0)
MCV: 101.3 fL — ABNORMAL HIGH (ref 80.0–100.0)
Monocytes Absolute: 0.5 K/uL (ref 0.1–1.0)
Monocytes Relative: 10 %
Neutro Abs: 1.6 K/uL — ABNORMAL LOW (ref 1.7–7.7)
Neutrophils Relative %: 33 %
Platelets: 66 K/uL — ABNORMAL LOW (ref 150–400)
RBC: 3.09 MIL/uL — ABNORMAL LOW (ref 3.87–5.11)
RDW: 14.7 % (ref 11.5–15.5)
Smear Review: NORMAL
WBC: 4.7 K/uL (ref 4.0–10.5)
nRBC: 0 % (ref 0.0–0.2)

## 2024-08-04 LAB — BASIC METABOLIC PANEL WITH GFR
Anion gap: 8 (ref 5–15)
BUN: 6 mg/dL — ABNORMAL LOW (ref 8–23)
CO2: 24 mmol/L (ref 22–32)
Calcium: 8.3 mg/dL — ABNORMAL LOW (ref 8.9–10.3)
Chloride: 106 mmol/L (ref 98–111)
Creatinine, Ser: 0.85 mg/dL (ref 0.44–1.00)
GFR, Estimated: >60 mL/min (ref >60)
Glucose, Bld: 110 mg/dL — ABNORMAL HIGH (ref 70–99)
Potassium: 3.7 mmol/L (ref 3.5–5.1)
Sodium: 138 mmol/L (ref 135–145)

## 2024-08-04 LAB — RESP PANEL BY RT-PCR (RSV, FLU A&B, COVID)  RVPGX2
Influenza A by PCR: NEGATIVE
Influenza B by PCR: NEGATIVE
Resp Syncytial Virus by PCR: NEGATIVE
SARS Coronavirus 2 by RT PCR: NEGATIVE

## 2024-08-04 MED ORDER — ACETAMINOPHEN 500 MG PO TABS: 1000.0000 mg | ORAL_TABLET | Freq: Once | ORAL | Status: DC

## 2024-08-04 MED ORDER — DEXAMETHASONE SOD PHOSPHATE PF 10 MG/ML IJ SOLN
8.0000 mg | Freq: Once | INTRAMUSCULAR | Status: AC
  Administered 2024-08-04: 8 mg via INTRAMUSCULAR

## 2024-08-04 MED ORDER — ACETAMINOPHEN 500 MG PO TABS
500.0000 mg | ORAL_TABLET | Freq: Once | ORAL | Status: AC
  Administered 2024-08-04: 500 mg via ORAL
  Filled 2024-08-04: qty 1

## 2024-08-04 MED ORDER — AMOXICILLIN-POT CLAVULANATE 875-125 MG PO TABS
ORAL_TABLET | ORAL | 0 refills | Status: AC
Start: 2024-08-04 — End: ?
  Filled 2024-08-04: qty 14, 7d supply, fill #0

## 2024-08-04 MED ORDER — AMOXICILLIN-POT CLAVULANATE 875-125 MG PO TABS: 1.0000 | ORAL_TABLET | Freq: Two times a day (BID) | ORAL | 0 refills | Status: AC

## 2024-08-04 NOTE — ED Notes (Signed)
 Pt transported to CT ?

## 2024-08-04 NOTE — ED Triage Notes (Signed)
 Pt reports left sided headache starting yesterday. Also reports sore to left side of head that she is to see dermatology in April for. Denies light sensitivity, n/v or vision changes.

## 2024-08-04 NOTE — ED Provider Notes (Signed)
 [place] [facility] AT [facility] Provider Note   CSN: [id number] Arrival date & time: 08/04/24  9074     Patient presents with: Headache   [name] is a 68 y.o. female with PMHx HLD, HTN, CAD, CHF, COPD, who presents to ED concerned for left-sided headache, rhinorrhea, mild cough over the past 3 days.  Patient took 1 Tylenol  pill yesterday which did not provide much relief in symptoms.  Patient denies fever, chest pain, dyspnea, nausea, vomiting, diarrhea.    Headache      Prior to Admission medications   Medication Sig Start Date End Date Taking? Authorizing Provider  amoxicillin -clavulanate (AUGMENTIN ) 875-125 MG tablet Take 1 tablet by mouth every 12 (twelve) hours for 7 days. 08/09/24 08/16/24 Yes Hoy Nidia FALCON, PA-C  albuterol  (PROAIR  HFA) [phone] Base) MCG/ACT inhaler Inhale 2 puffs into the lungs every 6 (six) hours as needed for wheezing or shortness of breath. 03/28/20   Geronimo Amel, MD  alendronate (FOSAMAX) 70 MG tablet Take 70 mg by mouth once a week. 09/22/23   [provider]  aspirin  EC 81 MG tablet Take 81 mg by mouth daily.    [provider]  atorvastatin  (LIPITOR) 40 MG tablet TAKE 1 TABLET BY MOUTH EVERY DAY 04/12/24   Arida, Muhammad A, MD  carvedilol  (COREG ) 6.25 MG tablet TAKE 1 TABLET BY MOUTH 2 TIMES DAILY WITH A MEAL. 09/29/23   Nishan, Peter C, MD  feeding supplement, ENSURE COMPLETE, (ENSURE COMPLETE) LIQD Take 237 mLs by mouth 3 (three) times daily between meals. 05/19/14   [provider]  folic acid  (FOLVITE ) 1 MG tablet Take 1 tablet (1 mg total) by mouth daily. 02/10/22   Hongalgi, Anand D, MD  furosemide  (LASIX ) 40 MG tablet Take 1 tablet (40 mg total) by mouth daily as needed for fluid or edema (Anytime you have any of the following symptoms: 1) 3 pound weight gain in 24 hours or 5 pounds in 1 week 2) shortness of breath, with or without a dry hacking cough 3) swelling in the hands, feet or  stomach 4) if you have to sleep on extra pillows at night in order to breathe.). 10/14/23   Nishan, Peter C, MD  levothyroxine  (SYNTHROID ) 100 MCG tablet Take 100 mcg by mouth every morning. 04/08/24   [provider]  levothyroxine  (SYNTHROID ) 75 MCG tablet Take 75 mcg by mouth every morning. 07/03/23   [provider]  Misc. Devices MISC Use scale daily to monitor weight. 01/10/22   [provider]  Multiple Vitamin (MULTIVITAMIN WITH MINERALS) TABS tablet Take 1 tablet by mouth daily. 02/10/22   Hongalgi, Anand D, MD  mupirocin  cream (BACTROBAN ) 2 % Apply 1 Application topically 2 (two) times daily. 04/04/24   Francis Ileana SAILOR, PA-C  nitroGLYCERIN  (NITROSTAT ) 0.4 MG SL tablet Place 1 tablet (0.4 mg total) under the tongue every 5 (five) minutes as needed for chest pain (up to 3 doses). 11/03/17   Nishan, Peter C, MD  sacubitril -valsartan  (ENTRESTO ) 24-26 MG TAKE 1 TABLET BY MOUTH TWICE A DAY 10/01/23   Nishan, Peter C, MD  spironolactone  (ALDACTONE ) 25 MG tablet TAKE 1/2 TABLET BY MOUTH EVERY DAY 11/17/23   Nishan, Peter C, MD  thiamine  100 MG tablet Take 1 tablet (100 mg total) by mouth daily. 02/10/22   Hongalgi, Anand D, MD    Allergies: Clopidogrel bisulfate    Review of Systems  Neurological:  Positive for headaches.    Updated Vital  Signs BP (!) 143/93   Pulse 90   Temp 98.6 F (37 C) (Oral)   Resp 18   Ht 5' 9 (1.753 m)   Wt 72.6 kg   SpO2 100%   BMI 23.63 kg/m   Physical Exam Vitals and nursing note reviewed.  Constitutional:      General: She is not in acute distress.    Appearance: She is not ill-appearing or toxic-appearing.  HENT:     Head: Normocephalic and atraumatic.     Mouth/Throat:     Mouth: Mucous membranes are moist.     Pharynx: No oropharyngeal exudate or posterior oropharyngeal erythema.  Eyes:     General: No scleral icterus.       Right eye: No discharge.        Left eye: No discharge.     Conjunctiva/sclera: Conjunctivae normal.   Cardiovascular:     Rate and Rhythm: Normal rate and regular rhythm.     Pulses: Normal pulses.     Heart sounds: Normal heart sounds. No murmur heard. Pulmonary:     Effort: Pulmonary effort is normal. No respiratory distress.     Breath sounds: Normal breath sounds. No wheezing, rhonchi or rales.  Abdominal:     General: Bowel sounds are normal.     Palpations: Abdomen is soft.     Tenderness: There is no abdominal tenderness.  Musculoskeletal:     Right lower leg: No edema.     Left lower leg: No edema.  Skin:    General: Skin is warm and dry.     Findings: No rash.  Neurological:     General: No focal deficit present.     Mental Status: She is alert. Mental status is at baseline.     Comments: GCS 15. Speech is goal oriented. No deficits appreciated to CN III-XII; symmetric eyebrow raise, no facial drooping, tongue midline. Patient has equal grip strength bilaterally with 5/5 strength against resistance in all major muscle groups bilaterally. Sensation to light touch intact. Patient moves extremities without ataxia.    Psychiatric:        Mood and Affect: Mood normal.        Behavior: Behavior normal.     (all labs ordered are listed, but only abnormal results are displayed) Labs Reviewed  BASIC METABOLIC PANEL WITH GFR - Abnormal; Notable for the following components:      Result Value   Glucose, Bld 110 (*)    BUN 6 (*)    Calcium  8.3 (*)    All other components within normal limits  CBC WITH DIFFERENTIAL/PLATELET - Abnormal; Notable for the following components:   RBC 3.09 (*)    Hemoglobin 10.1 (*)    HCT 31.3 (*)    MCV 101.3 (*)    Platelets 66 (*)    Neutro Abs 1.6 (*)    All other components within normal limits  RESP PANEL BY RT-PCR (RSV, FLU A&B, COVID)  [id number]    EKG: None  Radiology: CT Head Wo Contrast Result Date: 08/04/2024 EXAM: CT HEAD WITHOUT CONTRAST 08/04/2024 12:31:19 PM TECHNIQUE: CT of the head was performed without the administration  of intravenous contrast. Automated exposure control, iterative reconstruction, and/or weight based adjustment of the mA/kV was utilized to reduce the radiation dose to as low as reasonably achievable. COMPARISON: CT of the head dated 05/19/2014. CLINICAL HISTORY: Headache, increasing frequency or severity. FINDINGS: BRAIN AND VENTRICLES: No acute hemorrhage. No evidence of acute infarct. No hydrocephalus. No  extra-axial collection. No mass effect or midline shift. There is age-related atrophy and mild periventricular white matter disease. There are focal encephalomalacia changes within the left parietal occipital junction. There are also encephalomalacia changes within the left medial occipital lobe. Both areas of encephalomalacia have developed since the previous study. There is no apparent acute intracranial abnormality. ORBITS: No acute abnormality. SINUSES: There is moderate opacification of the left maxillary sinus. The patient is status post sinonasal surgery. SOFT TISSUES AND SKULL: No acute soft tissue abnormality. No skull fracture. IMPRESSION: 1. No acute intracranial abnormality. 2. Age-related atrophy and mild periventricular white matter disease. 3. 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 4. Moderate opacification of the left maxillary sinus. Status post sinonasal surgery. Electronically signed by: Evalene Coho MD 08/04/2024 12:40 PM EST RP Workstation: [id number]     Procedures   Medications Ordered in the ED  acetaminophen  (TYLENOL ) tablet 500 mg (500 mg Oral Given 08/04/24 1006)  dexamethasone  (DECADRON ) injection 8 mg (8 mg Intramuscular Given 08/04/24 1217)                                    Medical Decision Making Amount and/or Complexity of Data Reviewed Labs: ordered. Radiology: ordered.  Risk OTC drugs. Prescription drug management.    This patient presents to the  ED for concern of headache, rhinorrhea, cough, this involves an extensive number of treatment options, and is a complaint that carries with it a high risk of complications and morbidity.  The differential diagnosis includes Flu/COVID/RSV, strep pharyngitis, sinusitis, peritonsillar abscess, retropharyngeal abscess, pneumonia, meningitis.   Co morbidities that complicate the patient evaluation  HLD, HTN, CAD, CHF, COPD   Additional history obtained:  Dr. Delores PCP   Problem List / ED Course / Critical interventions / Medication management  Patient presents to ED concern for 3-day history of left-sided headache, rhinorrhea, cough.  Physical exam reassuring.  Patient afebrile with stable vitals. I Ordered, and personally interpreted labs.  CBC without leukocytosis.  There is mild anemia with hemoglobin at 10.1.  BMP with mild hyperglycemia at 110.  Respiratory panel negative. I ordered imaging studies including chest xray to assess for process contributing to patient's symptoms. I independently visualized and interpreted imaging which showed moderate opacification of the left maxillary sinus. I agree with the radiologist interpretation. Shared all results with patient.  Answered all questions. It appears that patient is suffering from viral URI.  Educated patient on symptomatic control with decongestions.  Will prescribe her course of antibiotics for if symptoms persist past 7-10 days.  Patient to follow-up with PCP.  Patient agreeable with plan. Staffed with Dr. Francesca. I have reviewed the patients home medicines and have made adjustments as needed The patient has been appropriately medically screened and/or stabilized in the ED. I have low suspicion for any other emergent medical condition which would require further screening, evaluation or treatment in the ED or require inpatient management. At time of discharge the patient is hemodynamically stable and in no acute distress. I have discussed  work-up results and diagnosis with patient and answered all questions. Patient is agreeable with discharge plan. We discussed strict return precautions for returning to the emergency department and they verbalized understanding.     Social Determinants of Health:  none      Final diagnoses:  Viral URI with cough  ED Discharge Orders          Ordered    amoxicillin -clavulanate (AUGMENTIN ) 875-125 MG tablet  Every 12 hours        08/04/24 1325               Hoy Nidia FALCON, [place] 08/04/24 1342    Francesca Elsie CROME, MD 08/05/24 [phone]

## 2024-08-04 NOTE — Discharge Instructions (Signed)
 As discussed, please use oral and nasal spray decongestions. You can start taking the antibiotics if symptoms persist >7 days. Please follow-up with your primary care provider.  Seek emergency care experiencing any new or worsening symptoms.

## 2024-08-12 ENCOUNTER — Ambulatory Visit
Admission: RE | Admit: 2024-08-12 | Discharge: 2024-08-12 | Disposition: A | Source: Ambulatory Visit | Attending: Nurse Practitioner

## 2024-08-12 DIAGNOSIS — R748 Abnormal levels of other serum enzymes: Secondary | ICD-10-CM

## 2024-08-12 DIAGNOSIS — K7469 Other cirrhosis of liver: Secondary | ICD-10-CM

## 2024-08-20 ENCOUNTER — Encounter: Payer: Self-pay | Admitting: Cardiovascular Disease

## 2024-08-22 ENCOUNTER — Ambulatory Visit

## 2024-08-23 ENCOUNTER — Encounter

## 2024-08-23 ENCOUNTER — Ambulatory Visit

## 2024-08-23 NOTE — Telephone Encounter (Signed)
 I called the patient and provided this information. She verbalized understanding and will proceed with the liver biopsy. She is scheduled 08/27/24. Completed a conference call with the patient and [facility] Scheduling. The arrival time and procedure instructions were discussed with the patient. She wrote it down and repeated it back correctly.

## 2024-08-24 NOTE — H&P (Signed)
 Chief Complaint: Patient was seen in consultation today for ASMA and AMA Positive, elevated LFT  at the request of Drazek,Dawn  Referring Physician(s): Drazek,Dawn  Supervising Physician: Vanice Revel  Patient Status: [facility] - Out-pt  History of Present Illness: [name] is a 68 y.o. female with PMHs of HTN, COPD, CAD, HF, PVD, TIA, ETOH abuse, elevated LFT and ASMA/AMA Positive, IR was requested for random liver biopsy.   Patient has been followed by [facility] liver specialist for evaluation of cirrhosis. Las has been showed elevated LFT, anemia, and thrombocytopenia, ASMA/AMA were positive. AFP on 06/01/24 was slightly elevated at 10.9. US  abdomen complete on 08/16/24 showed cirrhotic liver morphology without focal lesion, small volume ascites. Random liver biopsy for further evaluate was recommended to the patient which she decided to proceed.   ***  Past Medical History:  Diagnosis Date   Acute on chronic systolic heart failure (HCC)    Acute respiratory failure (HCC)    Automatic implantable cardioverter-defibrillator in situ    CAD (coronary artery disease)    a. Inf-post MI 2008 s/p BMS to large marginal of Cx.    COPD (chronic obstructive pulmonary disease) (HCC)    ETOH abuse    Hyperglycemia    HYPERLIPIDEMIA-MIXED 02/07/2009   HYPERTENSION, BENIGN 08/22/2009   PVD (peripheral vascular disease)    a. Evaluated by Dr. Darron 08/2012.   SYSTOLIC HEART FAILURE, CHRONIC 02/07/2009   a. EF 30% 2010, 29% by MRI 08/2012. b. s/p prophylactic Medtronic ICD implantation 10/2012.   TIA (transient ischemic attack) 05/18/2014   Tobacco abuse    VENTRICULAR TACHYCARDIA 05/21/2010    Past Surgical History:  Procedure Laterality Date   CARDIAC DEFIBRILLATOR PLACEMENT  10/2012   CESAREAN SECTION  1977; 1982   COLONOSCOPY     CYSTOSCOPY W/ STONE MANIPULATION  1990's   ESOPHAGOGASTRODUODENOSCOPY (EGD) WITH PROPOFOL  N/A 08/04/2018   Procedure: ESOPHAGOGASTRODUODENOSCOPY (EGD)  WITH PROPOFOL ;  Surgeon: Avram Lupita BRAVO, MD;  Location: [facility] ENDOSCOPY;  Service: Endoscopy;  Laterality: N/A;   IMPLANTABLE CARDIOVERTER DEFIBRILLATOR IMPLANT N/A 10/21/2012   Procedure: IMPLANTABLE CARDIOVERTER DEFIBRILLATOR IMPLANT;  Surgeon: Danelle LELON Birmingham, MD;  Location: [facility] CATH LAB;  Service: Cardiovascular;  Laterality: N/A;   RIGHT/LEFT HEART CATH AND CORONARY ANGIOGRAPHY N/A 03/26/2018   Procedure: RIGHT/LEFT HEART CATH AND CORONARY ANGIOGRAPHY;  Surgeon: Mady Bruckner, MD;  Location: [facility] INVASIVE CV LAB;  Service: Cardiovascular;  Laterality: N/A;    Allergies: Clopidogrel bisulfate  Medications: Prior to Admission medications  Medication Sig Start Date End Date Taking? Authorizing Provider  albuterol  (PROAIR  HFA) 108 (90 Base) MCG/ACT inhaler Inhale 2 puffs into the lungs every 6 (six) hours as needed for wheezing or shortness of breath. 03/28/20   Geronimo Amel, MD  alendronate (FOSAMAX) 70 MG tablet Take 70 mg by mouth once a week. 09/22/23   [provider]  amoxicillin -clavulanate (AUGMENTIN ) 875-125 MG tablet Take 1 tablet by mouth every 12 (twelve) hours for 7 days 08/04/24   Hoy Nidia FALCON, PA-C  aspirin  EC 81 MG tablet Take 81 mg by mouth daily.    [provider]  atorvastatin  (LIPITOR) 40 MG tablet TAKE 1 TABLET BY MOUTH EVERY DAY 04/12/24   Darron Deatrice LABOR, MD  carvedilol  (COREG ) 6.25 MG tablet TAKE 1 TABLET BY MOUTH 2 TIMES DAILY WITH A MEAL. 09/29/23   Nishan, Peter C, MD  feeding supplement, ENSURE COMPLETE, (ENSURE COMPLETE) LIQD Take 237 mLs by mouth 3 (three) times daily between meals.  05/19/14   [provider]  folic acid  (FOLVITE ) 1 MG tablet Take 1 tablet (1 mg total) by mouth daily. 02/10/22   Hongalgi, Anand D, MD  furosemide  (LASIX ) 40 MG tablet Take 1 tablet (40 mg total) by mouth daily as needed for fluid or edema (Anytime you have any of the following symptoms: 1) 3 pound weight gain in 24 hours or 5 pounds in 1 week 2)  shortness of breath, with or without a dry hacking cough 3) swelling in the hands, feet or stomach 4) if you have to sleep on extra pillows at night in order to breathe.). 10/14/23   Nishan, Peter C, MD  levothyroxine  (SYNTHROID ) 100 MCG tablet Take 100 mcg by mouth every morning. 04/08/24   [provider]  levothyroxine  (SYNTHROID ) 75 MCG tablet Take 75 mcg by mouth every morning. 07/03/23   [provider]  Misc. Devices MISC Use scale daily to monitor weight. 01/10/22   [provider]  Multiple Vitamin (MULTIVITAMIN WITH MINERALS) TABS tablet Take 1 tablet by mouth daily. 02/10/22   Hongalgi, Anand D, MD  mupirocin  cream (BACTROBAN ) 2 % Apply 1 Application topically 2 (two) times daily. 04/04/24   Keith, Kayla N, PA-C  nitroGLYCERIN  (NITROSTAT ) 0.4 MG SL tablet Place 1 tablet (0.4 mg total) under the tongue every 5 (five) minutes as needed for chest pain (up to 3 doses). 11/03/17   Nishan, Peter C, MD  sacubitril -valsartan  (ENTRESTO ) 24-26 MG TAKE 1 TABLET BY MOUTH TWICE A DAY 10/01/23   Nishan, Peter C, MD  spironolactone  (ALDACTONE ) 25 MG tablet TAKE 1/2 TABLET BY MOUTH EVERY DAY 11/17/23   Nishan, Peter C, MD  thiamine  100 MG tablet Take 1 tablet (100 mg total) by mouth daily. 02/10/22   Hongalgi, Trenda BIRCH, MD     Family History  Problem Relation Age of Onset   Cancer Mother        cervical   Heart attack Father    Asthma Brother    Other Brother        Pacemaker    Social History   Socioeconomic History   Marital status: Single    Spouse name: Not on file   Number of children: Not on file   Years of education: Not on file   Highest education level: Not on file  Occupational History   Not on file  Tobacco Use   Smoking status: Former    Current packs/day: 0.00    Average packs/day: 0.5 packs/day for 40.0 years (20.0 ttl pk-yrs)    Types: Cigarettes    Start date: 04/27/1978    Quit date: 04/27/2018    Years since quitting: 6.3   Smokeless tobacco: Never    Tobacco comments:    pt is on nicotine  patches   Vaping Use   Vaping status: Never Used  Substance and Sexual Activity   Alcohol use: Yes    Alcohol/week: 20.0 standard drinks of alcohol    Types: 20 Cans of beer per week    Comment: 05/18/2014 2, 40's qod; 1 beer last week    Drug use: No   Sexual activity: Yes  Other Topics Concern   Not on file  Social History Narrative   Single - disabled - last job at [employer]   + alcohol no smoking no substances   Social Drivers of Health   Tobacco Use: Medium Risk (08/04/2024)   Patient History    Smoking Tobacco Use: Former    Smokeless  Tobacco Use: Never    Passive Exposure: Not on file  Financial Resource Strain: Low Risk (10/17/2022)   Received from [facility]   Overall Financial Resource Strain (CARDIA)    Difficulty of Paying Living Expenses: Not hard at all  Food Insecurity: Medium Risk (07/22/2024)   Received from [facility]   Epic    Within the past 12 months, you worried that your food would run out before you got money to buy more: Sometimes true    Within the past 12 months, the food you bought just didn't last and you didn't have money to get more. : Sometimes true  Transportation Needs: No Transportation Needs (07/22/2024)   Received from [facility]    In the past 12 months, has lack of reliable transportation kept you from medical appointments, meetings, work or from getting things needed for daily living? : No  Physical Activity: Sufficiently Active (10/30/2021)   Received from [facility]   Exercise Vital Sign    On average, how many days per week do you engage in moderate to strenuous exercise (like a brisk walk)?: 7 days    On average, how many minutes do you engage in exercise at this level?: 30 min  Stress: No Stress Concern Present (10/30/2021)   Received from [facility] - Occupational Stress Questionnaire    Feeling of Stress  : Not at all  Social Connections: Moderately Integrated (10/30/2021)   Received from [facility]   Social Connection and Isolation Panel    In a typical week, how many times do you talk on the phone with family, friends, or neighbors?: More than three times a week    How often do you get together with friends or relatives?: More than three times a week    How often do you attend church or religious services?: 1 to 4 times per year    Do you belong to any clubs or organizations such as church groups, unions, fraternal or athletic groups, or school groups?: Yes    How often do you attend meetings of the clubs or organizations you belong to?: 1 to 4 times per year    Are you married, widowed, divorced, separated, never married, or living with a partner?: Never married  Depression (PHQ2-9): Low Risk (06/02/2024)   Depression (PHQ2-9)    PHQ-2 Score: 0  Alcohol Screen: Not on file  Housing: Low Risk (07/22/2024)   Received from [facility]   Epic    What is your living situation today?: I have a steady place to live    Think about the place you live. Do you have problems with any of the following? Choose all that apply:: None/None on this list  Utilities: Low Risk (07/22/2024)   Received from [facility]   Utilities    In the past 12 months has the electric, gas, oil, or water company threatened to shut off services in your home? : No  Health Literacy: Not on file     Review of Systems: A 12 point ROS discussed and pertinent positives are indicated in the HPI above.  All other systems are negative.  Vital Signs: There were no vitals taken for this visit.  *** Physical Exam     Imaging: US  Abdomen Complete Result Date: 08/16/2024 CLINICAL DATA:  OTHER CIRRHOSIS OF LIVER,ELEVATED LIVER ENZYMES EXAM: ABDOMEN ULTRASOUND COMPLETE COMPARISON:  April 08, 2024 FINDINGS: Gallbladder: Surgically absent Common bile duct: Diameter:  Visualized portion measures 2 mm, within normal limits.  Majority is not visualized secondary to shadowing bowel gas. Liver: No focal lesion identified. Heterogeneous, slightly increased and coarsened parenchymal echogenicity. Nodular liver contour. Portal vein is patent on color Doppler imaging with normal direction of blood flow towards the liver. IVC: No abnormality visualized. Pancreas: Visualized portion unremarkable. Spleen: Size and appearance within normal limits. Right Kidney: Length: 9.9 cm. Echogenicity within normal limits. No mass or hydronephrosis visualized. Left Kidney: Length: 10.3 cm. Echogenicity within normal limits. No mass or hydronephrosis visualized. Abdominal aorta: No aneurysm visualized. Portions are suboptimally assessed secondary to shadowing bowel gas. Other findings: Small volume ascites. IMPRESSION: 1. Cirrhotic liver morphology. No focal lesion identified. 2. Small volume ascites. Electronically Signed   By: Corean Salter M.D.   On: 08/16/2024 16:11   CT Head Wo Contrast Result Date: 08/04/2024 EXAM: CT HEAD WITHOUT CONTRAST 08/04/2024 12:31:19 PM TECHNIQUE: CT of the head was performed without the administration of intravenous contrast. Automated exposure control, iterative reconstruction, and/or weight based adjustment of the mA/kV was utilized to reduce the radiation dose to as low as reasonably achievable. COMPARISON: CT of the head dated 05/19/2014. CLINICAL HISTORY: Headache, increasing frequency or severity. FINDINGS: BRAIN AND VENTRICLES: No acute hemorrhage. No evidence of acute infarct. No hydrocephalus. No extra-axial collection. No mass effect or midline shift. There is age-related atrophy and mild periventricular white matter disease. There are focal encephalomalacia changes within the left parietal occipital junction. There are also encephalomalacia changes within the left medial occipital lobe. Both areas of encephalomalacia have developed since the previous study. There is no apparent acute intracranial  abnormality. ORBITS: No acute abnormality. SINUSES: There is moderate opacification of the left maxillary sinus. The patient is status post sinonasal surgery. SOFT TISSUES AND SKULL: No acute soft tissue abnormality. No skull fracture. IMPRESSION: 1. No acute intracranial abnormality. 2. Age-related atrophy and mild periventricular white matter disease. 3. 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 4. Moderate opacification of the left maxillary sinus. Status post sinonasal surgery. Electronically signed by: Evalene Coho MD 08/04/2024 12:40 PM EST RP Workstation: [id number]    Labs:  CBC: Recent Labs    10/28/23 1015 12/08/23 0920 06/01/24 0950 08/04/24 1023  WBC 5.2 4.5 5.1 4.7  HGB 11.8 11.6* 10.7* 10.1*  HCT 34.9 34.3* 31.1* 31.3*  PLT 62* 84* 41* 66*    COAGS: No results for input(s): INR, APTT in the last 8760 hours.  BMP: Recent Labs    10/28/23 1015 12/08/23 0920 06/01/24 0950 08/04/24 1023  NA 139 144 137 138  K 4.2 3.7 4.8 3.7  CL 102 108 109 106  CO2 21 28 23 24   GLUCOSE 102* 101* 119* 110*  BUN 8 7* 11 6*  CALCIUM  8.8 9.5 8.7* 8.3*  CREATININE 0.81 0.59 0.87 0.85  GFRNONAA  --  >60 >60 >60    LIVER FUNCTION TESTS: Recent Labs    10/28/23 1015 12/08/23 0920 06/01/24 0950  BILITOT 0.5 0.6 0.9  AST 101* 100* 111*  ALT 49* 41 50*  ALKPHOS 183* 115 210*  PROT 7.3 7.9 7.2  ALBUMIN 3.9 4.1 3.2*    TUMOR MARKERS: No results for input(s): AFPTM, CEA, CA199, CHROMGRNA in the last 8760 hours.  Assessment and Plan: 68 y.o. female with elevated LFT and positive ASMA/AMA who presents for random liver biopsy.   NPO since *** VSS CBC INR On aSA 81 mg, has  been held for *** days  Allergies reviewed   Risks and benefits of random liver biopsy was discussed with the patient and/or patient's family including, but not limited to bleeding,  infection, damage to adjacent structures or low yield requiring additional tests.  All of the questions were answered and there is agreement to proceed.  Consent signed and in chart.   Thank you for this interesting consult.  I greatly enjoyed meeting [name] and look forward to participating in their care.  A copy of this report was sent to the requesting provider on this date.  Electronically Signed: Toya VEAR Cousin, PA-C 08/24/2024, 1:48 PM   I spent a total of  30 Minutes   in face to face in clinical consultation, greater than 50% of which was counseling/coordinating care for random liver biopsy.   This chart was dictated using voice recognition software.  Despite best efforts to proofread,  errors can occur which can change the documentation meaning.

## 2024-08-25 ENCOUNTER — Telehealth: Payer: Self-pay

## 2024-08-25 NOTE — Telephone Encounter (Signed)
 Received call from patient.  She reports she called [employer] today because her home monitor is not working.  She will send a new report when she receives the monitor in the next 7-10 days.  Advised appreciated the call to let us  know her monitor is not working and she will receive a new one in about a week.  Rescheduled ICM remote transmission for 09/06/2024.

## 2024-08-26 ENCOUNTER — Other Ambulatory Visit: Payer: Self-pay | Admitting: Diagnostic Radiology

## 2024-08-26 DIAGNOSIS — Z01818 Encounter for other preprocedural examination: Secondary | ICD-10-CM

## 2024-08-27 ENCOUNTER — Other Ambulatory Visit: Payer: Self-pay

## 2024-08-27 ENCOUNTER — Ambulatory Visit (HOSPITAL_COMMUNITY)
Admission: RE | Admit: 2024-08-27 | Discharge: 2024-08-27 | Disposition: A | Discharge status: Home / Self Care | Source: Ambulatory Visit | Attending: Nurse Practitioner | Admitting: Nurse Practitioner

## 2024-08-27 DIAGNOSIS — K7469 Other cirrhosis of liver: Secondary | ICD-10-CM | POA: Insufficient documentation

## 2024-08-27 DIAGNOSIS — R188 Other ascites: Secondary | ICD-10-CM | POA: Diagnosis not present

## 2024-08-27 DIAGNOSIS — D696 Thrombocytopenia, unspecified: Secondary | ICD-10-CM | POA: Diagnosis not present

## 2024-08-27 DIAGNOSIS — D649 Anemia, unspecified: Secondary | ICD-10-CM | POA: Diagnosis not present

## 2024-08-27 DIAGNOSIS — I2510 Atherosclerotic heart disease of native coronary artery without angina pectoris: Secondary | ICD-10-CM | POA: Insufficient documentation

## 2024-08-27 DIAGNOSIS — J449 Chronic obstructive pulmonary disease, unspecified: Secondary | ICD-10-CM | POA: Insufficient documentation

## 2024-08-27 DIAGNOSIS — R748 Abnormal levels of other serum enzymes: Secondary | ICD-10-CM | POA: Diagnosis not present

## 2024-08-27 DIAGNOSIS — I739 Peripheral vascular disease, unspecified: Secondary | ICD-10-CM | POA: Diagnosis not present

## 2024-08-27 DIAGNOSIS — Z01818 Encounter for other preprocedural examination: Secondary | ICD-10-CM

## 2024-08-27 DIAGNOSIS — I5022 Chronic systolic (congestive) heart failure: Secondary | ICD-10-CM | POA: Diagnosis not present

## 2024-08-27 DIAGNOSIS — I110 Hypertensive heart disease with heart failure: Secondary | ICD-10-CM | POA: Diagnosis not present

## 2024-08-27 LAB — CBC
HCT: 34.4 % — ABNORMAL LOW (ref 36.0–46.0)
Hemoglobin: 11.8 g/dL — ABNORMAL LOW (ref 12.0–15.0)
MCH: 33.2 pg (ref 26.0–34.0)
MCHC: 34.3 g/dL (ref 30.0–36.0)
MCV: 96.9 fL (ref 80.0–100.0)
Platelets: 57 K/uL — ABNORMAL LOW (ref 150–400)
RBC: 3.55 MIL/uL — ABNORMAL LOW (ref 3.87–5.11)
RDW: 13.6 % (ref 11.5–15.5)
WBC: 6 K/uL (ref 4.0–10.5)
nRBC: 0 % (ref 0.0–0.2)

## 2024-08-27 LAB — PROTIME-INR
INR: 1.3 — ABNORMAL HIGH (ref 0.8–1.2)
Prothrombin Time: 17.1 s — ABNORMAL HIGH (ref 11.4–15.2)

## 2024-08-27 MED ORDER — MIDAZOLAM HCL 2 MG/2ML IJ SOLN
INTRAMUSCULAR | Status: AC
  Filled 2024-08-27: qty 2

## 2024-08-27 MED ORDER — FENTANYL CITRATE (PF) 100 MCG/2ML IJ SOLN
INTRAMUSCULAR | Status: AC
  Filled 2024-08-27: qty 2

## 2024-08-27 MED ORDER — LIDOCAINE HCL (PF) 1 % IJ SOLN
10.0000 mL | Freq: Once | INTRAMUSCULAR | Status: AC
  Administered 2024-08-27: 10 mL via INTRADERMAL

## 2024-08-27 MED ORDER — MIDAZOLAM HCL (PF) 2 MG/2ML IJ SOLN
INTRAMUSCULAR | Status: AC | PRN
  Administered 2024-08-27: 1 mg via INTRAVENOUS
  Administered 2024-08-27: .5 mg via INTRAVENOUS

## 2024-08-27 MED ORDER — FENTANYL CITRATE (PF) 100 MCG/2ML IJ SOLN
INTRAMUSCULAR | Status: AC | PRN
  Administered 2024-08-27: 50 ug via INTRAVENOUS
  Administered 2024-08-27: 25 ug via INTRAVENOUS

## 2024-08-27 MED ORDER — SODIUM CHLORIDE 0.9 % IV SOLN: INTRAVENOUS | Status: DC

## 2024-08-27 MED ORDER — HYDROCODONE-ACETAMINOPHEN 5-325 MG PO TABS: 1.0000 | ORAL_TABLET | ORAL | Status: DC | PRN

## 2024-08-27 NOTE — Progress Notes (Signed)
 Report received from RN Kayla at 1427, discharge instructions complete per previous RN. Patient able to tolerate PO intake. Site is clean,dry, and intact, soft.

## 2024-08-27 NOTE — Procedures (Signed)
" °  Procedure:  US  core biopsy liver 18g x3 Preprocedure diagnosis: Diagnoses of Other cirrhosis of liver (HCC), Elevated liver enzymes, and Preop examination were pertinent to this visit. Postprocedure diagnosis: same EBL:    minimal Complications:   none immediate  See full dictation in [employer].  CHARM Toribio Faes MD Main # [phone] Pager  [phone] Mobile [phone]    "

## 2024-08-28 ENCOUNTER — Ambulatory Visit: Attending: Internal Medicine

## 2024-08-29 LAB — CUP PACEART REMOTE DEVICE CHECK
Battery Remaining Longevity: 2 mo
Battery Voltage: 2.8 V
Brady Statistic RV Percent Paced: 0.03 %
Date Time Interrogation Session: 20251220143804
HighPow Impedance: 70 Ohm
Implantable Lead Connection Status: 753985
Implantable Lead Implant Date: 20140212
Implantable Lead Location: 753860
Implantable Lead Model: 6935
Implantable Pulse Generator Implant Date: 20140212
Lead Channel Impedance Value: 437 Ohm
Lead Channel Impedance Value: 456 Ohm
Lead Channel Pacing Threshold Amplitude: 0.625 V
Lead Channel Pacing Threshold Pulse Width: 0.4 ms
Lead Channel Sensing Intrinsic Amplitude: 10.25 mV
Lead Channel Sensing Intrinsic Amplitude: 10.25 mV
Lead Channel Setting Pacing Amplitude: 2.5 V
Lead Channel Setting Pacing Pulse Width: 0.4 ms
Lead Channel Setting Sensing Sensitivity: 0.3 mV
Zone Setting Status: 755011

## 2024-08-30 LAB — SURGICAL PATHOLOGY

## 2024-08-31 ENCOUNTER — Ambulatory Visit: Attending: Internal Medicine

## 2024-08-31 DIAGNOSIS — I5022 Chronic systolic (congestive) heart failure: Secondary | ICD-10-CM | POA: Diagnosis not present

## 2024-08-31 DIAGNOSIS — Z95810 Presence of automatic (implantable) cardiac defibrillator: Secondary | ICD-10-CM | POA: Diagnosis not present

## 2024-08-31 NOTE — Progress Notes (Signed)
 EPIC Encounter for ICM Monitoring  Patient Name: [name] is a 68 y.o. female Date: 08/31/2024 Primary Care Physican: [name], NP Primary Cardiologist: Delford Electrophysiologist: Waddell 10/28/2023 Office Weight: 171 lbs      11/18/2023 Weight: 164.5 lbs    01/01/2024 Weight: 164 lbs 02/06/2024 Weight: 159 lbs 04/27/2024 Weight: 160 lbs 06/07/2024 Weight: 155-160 lbs 06/10/2024 Weight: 159 lbs 07/16/2024 Weight: 153 lbs   Battery Longevity: 2 months                                            Spoke with patient and heart failure questions reviewed.  Transmission results reviewed.  Pt asymptomatic for fluid accumulation. She took PRN Lasix  at the end of November for a few days with correlates with decreased impedance.    Since 07/16/2024 ICM Remote Transmission: Optivol thoracic impedance suggesting normal normal fluid levels with the exception of possible fluid accumulation from 07/21/2024-08/10/2024.     Prescribed: Furosemide  40 mg Take 1 tablet (40 mg total) by mouth daily as needed for fluid or edema (Anytime you have any of the following symptoms: 1) 3 pound weight gain in 24 hours or 5 pounds in 1 week 2) shortness of breath, with or without a dry hacking cough 3) swelling in the hands, feet or stomach 4) if you have to sleep on extra pillows at night in order to breathe). Spironolactone  25 mg take 0.5 tablet (12.5 mg total)  by mouth daily.   Labs: 12/08/2023 Creatinine 0.59, BUN 7, Potassium 3.7, Sodium 144, GFR >60 10/28/2023 Creatinine 0.81, BUN 8, Potassium 4.2, Sodium 139, GFR 80 A complete set of results can be found in Results Review.   Recommendations:  No changes and encouraged to call if experiencing any fluid symptoms.   Follow-up plan: ICM clinic phone appointment on 10/05/2023.   91 day device clinic remote transmission 09/28/2024.     EP/Cardiology Office Visits:  Recall 09/20/2024 with Dr Delford.   Recall 10/27/2024 with Dr Darron.    Recall 05/26/2025 with  Dr Waddell or APP.    Copy of ICM check sent to Dr. Waddell.    Remote monitoring is medically necessary for Heart Failure Management.    Daily Thoracic Impedance ICM trend: 06/01/2024 through 08/31/2024.    12-14 Month Thoracic Impedance ICM trend:     [name], RN 08/31/2024 12:31 PM

## 2024-09-05 ENCOUNTER — Ambulatory Visit: Payer: Self-pay | Admitting: Internal Medicine

## 2024-09-06 ENCOUNTER — Ambulatory Visit

## 2024-09-22 ENCOUNTER — Ambulatory Visit

## 2024-09-28 ENCOUNTER — Ambulatory Visit

## 2024-09-28 DIAGNOSIS — I5022 Chronic systolic (congestive) heart failure: Secondary | ICD-10-CM

## 2024-09-29 LAB — CUP PACEART REMOTE DEVICE CHECK
Battery Remaining Longevity: 1 mo
Battery Voltage: 2.81 V
Brady Statistic RV Percent Paced: 0.02 %
Date Time Interrogation Session: 20260120121704
HighPow Impedance: 59 Ohm
Implantable Lead Connection Status: 753985
Implantable Lead Implant Date: 20140212
Implantable Lead Location: 753860
Implantable Lead Model: 6935
Implantable Pulse Generator Implant Date: 20140212
Lead Channel Impedance Value: 437 Ohm
Lead Channel Impedance Value: 456 Ohm
Lead Channel Pacing Threshold Amplitude: 0.625 V
Lead Channel Pacing Threshold Pulse Width: 0.4 ms
Lead Channel Sensing Intrinsic Amplitude: 8 mV
Lead Channel Sensing Intrinsic Amplitude: 8 mV
Lead Channel Setting Pacing Amplitude: 2.5 V
Lead Channel Setting Pacing Pulse Width: 0.4 ms
Lead Channel Setting Sensing Sensitivity: 0.3 mV
Zone Setting Status: 755011

## 2024-10-01 NOTE — Progress Notes (Signed)
 Remote ICD Transmission

## 2024-10-04 ENCOUNTER — Ambulatory Visit

## 2024-10-04 DIAGNOSIS — Z95810 Presence of automatic (implantable) cardiac defibrillator: Secondary | ICD-10-CM | POA: Diagnosis not present

## 2024-10-04 DIAGNOSIS — I5022 Chronic systolic (congestive) heart failure: Secondary | ICD-10-CM | POA: Diagnosis not present

## 2024-10-05 ENCOUNTER — Ambulatory Visit: Payer: Self-pay | Admitting: Cardiovascular Disease

## 2024-10-06 ENCOUNTER — Telehealth: Payer: Self-pay

## 2024-10-06 NOTE — Telephone Encounter (Signed)
 Remote ICM transmission received.  Attempted call to patient regarding ICM remote transmissio and no answer.

## 2024-10-06 NOTE — Progress Notes (Signed)
 EPIC Encounter for ICM Monitoring  Patient Name: [name] is a 69 y.o. female Date: 10/06/2024 Primary Care Physican: [name], NP Primary Cardiologist: Delford Electrophysiologist: Mealor 10/28/2023 Office Weight: 171 lbs      11/18/2023 Weight: 164.5 lbs    01/01/2024 Weight: 164 lbs 02/06/2024 Weight: 159 lbs 04/27/2024 Weight: 160 lbs 06/07/2024 Weight: 155-160 lbs 06/10/2024 Weight: 159 lbs 07/16/2024 Weight: 153 lbs   Battery Longevity: 2 months                                            Attempted call to patient and unable to reach.  Transmission results reviewed.      Since 12/232025 ICM Remote Transmission: Optivol thoracic impedance suggesting possible fluid accumulation starting 09/26/2024 and returned to baseline 10/05/2024.  Suggesting possible fluid accumulation from 08/29/2024-09/18/2024.    Prescribed: Furosemide  40 mg Take 1 tablet (40 mg total) by mouth daily as needed for fluid or edema (Anytime you have any of the following symptoms: 1) 3 pound weight gain in 24 hours or 5 pounds in 1 week 2) shortness of breath, with or without a dry hacking cough 3) swelling in the hands, feet or stomach 4) if you have to sleep on extra pillows at night in order to breathe). Spironolactone  25 mg take 0.5 tablet (12.5 mg total)  by mouth daily.   Labs: 08/04/2024 Creatinine 0.85, BUN 6, Potassium 3.7, Sodium 138, GFR >60  06/01/2024 Creatinine 0.87, BUN 11, Potassium 4.8, Sodium 137  A complete set of results can be found in Results Review..   Recommendations:  Unable to reach.     Follow-up plan: ICM clinic 31 day follow up currently on hold but 91 day remote monitoring will continue.   91 day device clinic remote transmission 11/29/2024.     EP/Cardiology Office Visits:  None   Copy of ICM check sent to Dr. Nancey.     Remote monitoring is medically necessary for Heart Failure Management.    Daily Thoracic Impedance ICM trend: 07/06/2024 through  10/04/2024.    12-14 Month Thoracic Impedance ICM trend:     [name], RN 10/06/2024 3:24 PM

## 2024-10-23 ENCOUNTER — Ambulatory Visit

## 2024-10-29 ENCOUNTER — Ambulatory Visit

## 2024-11-02 ENCOUNTER — Ambulatory Visit: Admitting: Dermatology

## 2024-11-29 ENCOUNTER — Ambulatory Visit

## 2024-11-29 ENCOUNTER — Other Ambulatory Visit

## 2024-11-29 ENCOUNTER — Ambulatory Visit: Admitting: Internal Medicine

## 2024-12-15 ENCOUNTER — Ambulatory Visit: Admitting: Dermatology

## 2024-12-30 ENCOUNTER — Ambulatory Visit
